# Patient Record
Sex: Female | Born: 1940 | Race: White | Hispanic: No | Smoking: Never smoker
Health system: Southern US, Community
[De-identification: ages and names within clinical notes are randomized; demographics above are authoritative.]

## PROBLEM LIST (undated history)

## (undated) DIAGNOSIS — J9601 Acute respiratory failure with hypoxia: Secondary | ICD-10-CM

## (undated) DIAGNOSIS — K219 Gastro-esophageal reflux disease without esophagitis: Secondary | ICD-10-CM

## (undated) DIAGNOSIS — J309 Allergic rhinitis, unspecified: Secondary | ICD-10-CM

## (undated) DIAGNOSIS — M25579 Pain in unspecified ankle and joints of unspecified foot: Secondary | ICD-10-CM

## (undated) DIAGNOSIS — M1990 Unspecified osteoarthritis, unspecified site: Secondary | ICD-10-CM

## (undated) DIAGNOSIS — E785 Hyperlipidemia, unspecified: Secondary | ICD-10-CM

## (undated) DIAGNOSIS — I10 Essential (primary) hypertension: Secondary | ICD-10-CM

## (undated) DIAGNOSIS — Z7901 Long term (current) use of anticoagulants: Secondary | ICD-10-CM

## (undated) HISTORY — DX: Unspecified osteoarthritis, unspecified site: M19.90

## (undated) HISTORY — PX: FRACTURE SURGERY: SHX138

## (undated) HISTORY — DX: Gastro-esophageal reflux disease without esophagitis: K21.9

## (undated) HISTORY — DX: Allergic rhinitis, unspecified: J30.9

## (undated) HISTORY — PX: COLONOSCOPY: SHX174

## (undated) HISTORY — PX: TONSILLECTOMY: SUR1361

## (undated) HISTORY — DX: Pain in unspecified ankle and joints of unspecified foot: M25.579

## (undated) HISTORY — DX: Hyperlipidemia, unspecified: E78.5

## (undated) HISTORY — DX: Essential (primary) hypertension: I10

---
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
## 2008-12-22 HISTORY — PX: OTHER SURGICAL HISTORY: SHX169

## 2008-12-22 HISTORY — PX: ROTATOR CUFF REPAIR: SHX139

## 2009-02-13 ENCOUNTER — Emergency Department (HOSPITAL_COMMUNITY): Admission: EM | Admit: 2009-02-13 | Discharge: 2009-02-14 | Payer: Self-pay | Admitting: Emergency Medicine

## 2009-02-13 ENCOUNTER — Ambulatory Visit: Payer: Self-pay | Admitting: Internal Medicine

## 2009-04-04 ENCOUNTER — Inpatient Hospital Stay (HOSPITAL_BASED_OUTPATIENT_CLINIC_OR_DEPARTMENT_OTHER): Admission: RE | Admit: 2009-04-04 | Discharge: 2009-04-04 | Payer: Self-pay | Admitting: Cardiology

## 2011-04-08 LAB — URINALYSIS, ROUTINE W REFLEX MICROSCOPIC
Bilirubin Urine: NEGATIVE
Glucose, UA: 100 mg/dL — AB
Ketones, ur: >80 mg/dL — AB
Nitrite: NEGATIVE
Specific Gravity, Urine: 1.015 (ref 1.005–1.030)
pH: 7.5 (ref 5.0–8.0)

## 2011-04-08 LAB — HEPATIC FUNCTION PANEL
AST: 35 U/L (ref 0–37)
Albumin: 4.1 g/dL (ref 3.5–5.2)
Alkaline Phosphatase: 58 U/L (ref 39–117)
Total Bilirubin: 1.1 mg/dL (ref 0.3–1.2)
Total Protein: 7.2 g/dL (ref 6.0–8.3)

## 2011-04-08 LAB — CBC
HCT: 43.7 % (ref 36.0–46.0)
MCHC: 34.7 g/dL (ref 30.0–36.0)
MCV: 91.3 fL (ref 78.0–100.0)
Platelets: 178 10*3/uL (ref 150–400)
RDW: 12.6 % (ref 11.5–15.5)

## 2011-04-08 LAB — CK TOTAL AND CKMB (NOT AT ARMC)
CK, MB: 2.3 ng/mL (ref 0.3–4.0)
CK, MB: 2.8 ng/mL (ref 0.3–4.0)
Relative Index: 2.1 (ref 0.0–2.5)
Relative Index: 2.4 (ref 0.0–2.5)

## 2011-04-08 LAB — URINE MICROSCOPIC-ADD ON

## 2011-04-08 LAB — BASIC METABOLIC PANEL
BUN: 8 mg/dL (ref 6–23)
CO2: 25 mEq/L (ref 19–32)
Chloride: 102 mEq/L (ref 96–112)
GFR calc non Af Amer: >60 mL/min (ref >60)
Glucose, Bld: 126 mg/dL — ABNORMAL HIGH (ref 70–99)
Potassium: 3.8 mEq/L (ref 3.5–5.1)

## 2011-04-08 LAB — DIFFERENTIAL
Eosinophils Absolute: 0.1 10*3/uL (ref 0.0–0.7)
Eosinophils Relative: 2 % (ref 0–5)
Lymphocytes Relative: 32 % (ref 12–46)
Lymphs Abs: 2.2 10*3/uL (ref 0.7–4.0)
Monocytes Absolute: 0.5 10*3/uL (ref 0.1–1.0)
Monocytes Relative: 8 % (ref 3–12)

## 2011-04-08 LAB — GASTRIC OCCULT BLOOD (1-CARD TO LAB): Occult Blood, Gastric: POSITIVE — AB

## 2011-05-06 NOTE — Consult Note (Signed)
NAME:  Melanie [name], CRUTCHER NO.:  [id number]   MEDICAL RECORD NO.:  [id number]          PATIENT TYPE:  EMS   LOCATION:  MAJO                         FACILITY:  [facility]   PHYSICIAN:  Renee Ramus, MD       DATE OF BIRTH:  1941-07-20   DATE OF CONSULTATION:  DATE OF DISCHARGE:                                 CONSULTATION   HISTORY OF PRESENT ILLNESS:  The patient is a  70 year old female who  presented to the emergency department with acute complaint of nausea,  vomiting, racing heart as well as some chest pain.  The patient was in  [place], while leaving [employer], she had a sudden onset of severe  nausea, vomiting, followed by racing heart.  The patient has no previous  history of these symptoms.  The patient has no previous history of  atrial fibrillation, gastroesophageal reflux disease or coronary artery  disease.  The patient denies any recent history of fever, chills, night  sweats, nausea, vomiting, chest pain, shortness of breath, PND, or  orthopnea.  When the patient was admitted to the emergency department,  she was hypertensive with blood pressure of 204/148 and a pulse rate of  137 and was fount to be in atrial fibrillation.  The patient was given  diltiazem and a GI cocktail and she is now stabilized.  She is  responding well to diltiazem drip currently on a rate of 7.  She is in  normal sinus rhythm and the GI cocktail has made her symptoms go away.  The patient has had negative troponins x2 and currently is pain-free and  symptom-free.   PAST MEDICAL HISTORY:  1. Hyperlipidemia.  2. Obesity.   SOCIAL HISTORY:  The patient denies alcohol or tobacco use.  She is  married, lives at home.   FAMILY HISTORY:  Not available.   REVIEW OF SYSTEMS:  All other comprehensive review of systems are  negative.   MEDICATIONS:  Currently fish oil.   PHYSICAL EXAMINATION:  GENERAL:  She is a well-developed, well-nourished  white female, currently in no apparent  distress.  VITAL SIGNS:  Blood pressure 133/61, heart rate 77 in sinus, respiratory  rate 20, and temperature 98.1.  HEENT:  No jugular venous distention, or lymphadenopathy.  Oropharynx is  clear.  Mucous membranes pink and moist.  TMs are clear bilaterally.  Pupils equal and reactive to light and accommodation.  Extraocular  muscles are intact.  CARDIOVASCULAR:  She has a regular rate and rhythm without evidence of  murmurs, rubs, or gallops.  PULMONARY:  Lungs are clear to auscultation bilaterally.  ABDOMEN:  Soft, obese, nontender, and nondistended without  hepatosplenomegaly.  Bowel sounds are present.  She has no rebound or  guarding.  EXTREMITIES:  She has no clubbing, cyanosis, or edema.  She has good  peripheral pulses in dorsalis pedis and radial artery.  She is able to  move all extremities.  NEUROLOGIC:  Cranial nerves II through XII are grossly intact.  She has  no focal neurological deficits.   STUDIES:  1. Abdominal ultrasound shows no evidence of acute  cholecystitis but      possibility of nonalcoholic steatohepatitis.  She does have an      enlarged liver with fatty infiltration.  2. Chest x-ray shows mild cardiomegaly with a thickened paratracheal      stripe but no evidence of acute infection or acute cardiopulmonary      disease.   LABORATORY DATA:  White count 7.1, H and H 15 and 43, MCV 91, and  platelets 178.  Sodium 140, potassium 3.8, chloride 102, bicarb 25, BUN  8, creatinine 0.7, and glucose 126.  UA shows evidence of UTI with  bacteria and leukocytes.  She has negative troponin I x2.  TSH is 2.56  and she has a CHADS score of 0.   ASSESSMENT AND PLAN:  1. Atrial fibrillation.  The patient is currently in sinus rhythm and      while she has no history of atrial fibrillation, I do not believe      that anticoagulation with Coumadin is warranted at this time.  We      are going to discharge her to home with Cardizem ER at 90 mg p.o.      b.i.d. and we  are going to have her follow up with Cardiology for      any further testing.  The patient should probably have her lipid      panel checked and revisit the idea of statin therapy and more than      likely is suffering from acute gastroesophageal reflux disease.  2. Pyelonephritis.  We will place the patient on ciprofloxacin x5      days.  3. Hyperlipidemia.  As above, would strongly consider statin therapy.  4. Obesity.  Currently, stable.  5. Question of steatohepatitis.  The patient does not have elevations      in liver enzymes and this may just represent simple fatty      infiltration of her liver.  We have counseled her against any      alcohol consumption and to change her diet.  6. Gastroesophageal reflux disease.  The patient will be placed on H2      blocker, i.e., Pepcid for 20 mg p.o. b.i.d. x6 weeks.  Will follow      up with her primary care physician as needed.  7. Disposition.  The patient will be discharged to home.   Consult note was constructed by reviewing past medical history,  conferring with emergency medical room physician, reviewing the  emergency medical record.   TIME SPENT:  1 hour.      Renee Ramus, MD  Electronically Signed     JF/MEDQ  D:  02/14/2009  T:  02/15/2009  Job:  [id number]   cc:   Nino Glow, Dr.

## 2011-05-06 NOTE — Consult Note (Signed)
NAME:  Melanie [name], DICKIE NO.:  [id number]   MEDICAL RECORD NO.:  [id number]          PATIENT TYPE:  EMS   LOCATION:  MAJO                         FACILITY:  [facility]   PHYSICIAN:  Bevelyn Buckles. Bensimhon, MDDATE OF BIRTH:  06-03-1941   DATE OF CONSULTATION:  DATE OF DISCHARGE:                                 CONSULTATION   PRIMARY CARE PHYSICIAN:  Dr. Nino Glow in [place].   REASON FOR CONSULTATION:  Chest pain in atrial fibrillation.   CONSULTING PHYSICIAN:  Dr. Marisa Severin in the ER.   HISTORY OF PRESENT ILLNESS:  Ms. Curtner is a delightful 70 year old woman  with a history of hyperlipidemia and obesity.  She denies any history of  known heart disease.  She has never had a cardiac catheterization.  She  says she was doing quite well until this evening when she went to [employer].  While she was eating, she had the sudden onset of chest and  epigastric pain associated with nausea and vomiting.  On arrival to the  emergency room, she was in severe pain.  She was treated with morphine  and nitroglycerin.  EKG showed atrial fibrillation with a rate of 160.  She was given several boluses of diltiazem, but her heart rate initially  came down then climbed back up.  She was then started on the diltiazem  drip and her heart rate is now well controlled.  Cardiac markers are  normal.   REVIEW OF SYSTEMS:  She denies any exertional chest pain, shortness of  breath or palpitations.  She has not had any congestive heart failure  symptoms.  She denies any previous history of gallstone disease or  pancreatitis.  She has not had any recent diarrhea, fevers or chills.   Otherwise, all systems negative.   PROBLEM LIST:  1. Obesity.  2. Hyperlipidemia.   CURRENT MEDICATIONS:  Fish oil.   ALLERGIES/INTOLERANCES:  SHE IS INTOLERANT TO NIASPAN DUE TO FLUSHING.   SOCIAL HISTORY:  She is married.  She lives in [place].  She is  retired.  Denies any tobacco or alcohol use.   FAMILY HISTORY:  Father died at age 17 due to blood poisoning from strep  throat.  Mother died in her 50s.  She thinks she had some heart disease.  There is no family history of premature coronary disease.   PHYSICAL EXAMINATION:  GENERAL:  She is mildly uncomfortable.  VITAL SIGNS:  Blood pressure is 101/55.  Her heart rate 85.  She is  satting 93% on room air.  She is afebrile.  HEENT:  Normal.  NECK:  Supple.  No JVD.  Carotids are 2+ bilaterally without bruits.  There is no lymphadenopathy or thyromegaly.  CARDIAC:  PMI is nondisplaced.  Regular rate and rhythm.  No murmurs,  rubs or gallops.  LUNGS:  Clear.  ABDOMEN:  Obese.  She is markedly tender in the epigastrium.  There is  no rebound.  There hypoactive bowel sounds.  No hepatosplenomegaly.  EXTREMITIES:  Warm with no cyanosis, clubbing or edema.  There is good  distal pulses.  No rash.  NEUROLOGICAL:  Alert and oriented x3.  Cranial nerves II-XII are intact.  Moves all four extremities without difficulty.   LABORATORY DATA:  White count is 7.1, hemoglobin 15.1, platelets are  178.  Sodium 140, potassium 3.8, BUN 8, creatinine 0.76, troponin is  less than 0.01, CK is 158, MB is 1.9, AST is 35, ALT is 26, alk phos is  58, bilirubin is 1.1.  Amylase is 131, lipase is 34.  EKG shows atrial  fibrillation at a ventricular rate of 160.  There is minimal nonspecific  ST depression.   ASSESSMENT/PLAN:  1. Chest epigastric pain after eating.  2. Probable new onset atrial fibrillation with rapid ventricular      response.  3. Obesity.  4. Hyperlipidemia.   PLAN/DISCUSSION:  Despite her normal LFTs and amylase and lipase, her  exam and history is very concerning for gallstone pancreatitis.  I  discussed this with Dr. Norlene Campbell.  We will go ahead and order an abdominal  ultrasound to further evaluate.  Cardiology will follow for management  of her atrial fibrillation.  We will continue on her Cardizem drip for  now given suspicion of  pancreatitis.  I would hold anticoagulation given  the risk of hemorrhagic transformation.  We will cycle cardiac markers  and check an echocardiogram and thyroid panel.  She will likely be  admitted to the internal medicine service.      Bevelyn Buckles. Bensimhon, MD  Electronically Signed     DRB/MEDQ  D:  02/13/2009  T:  02/14/2009  Job:  [id number]

## 2011-05-06 NOTE — Cardiovascular Report (Signed)
NAME:  PURVI, RUEHL NO.:  [id number]   MEDICAL RECORD NO.:  [id number]          PATIENT TYPE:  OIB   LOCATION:  1961                         FACILITY:  [facility]   PHYSICIAN:  Armanda Magic, M.D.     DATE OF BIRTH:  01/12/1941   DATE OF PROCEDURE:  04/04/2009  DATE OF DISCHARGE:  04/04/2009                            CARDIAC CATHETERIZATION   REFERRING PHYSICIAN:  The patient does not have a referring physician.   PROCEDURES:  Left heart catheterization, coronary angiography, left  ventriculography.   OPERATOR:  Armanda Magic, MD   INDICATIONS:  Shortness of breath and abnormal Cardiolite.   COMPLICATIONS:  None.   IV MEDICATIONS:  1. Versed 1 mg.  2. Fentanyl 25 mcg IV.   COMPLICATIONS:  None.   This is a 70 year old female who presents with shortness of breath and  was found to have an abnormal Cardiolite, now presents for cardiac  catheterization.   The patient was brought to the Cardiac Catheterization Laboratory in a  fasting nonsedated state.  Informed consent was obtained.  The patient  was connected to continuous heart rate and pulse oximetry monitoring and  intermittent blood pressure.  The right groin was prepped and draped in  a sterile fashion.  Xylocaine 1% was used for local anesthesia.  Using  modified Seldinger technique, a 4-French sheath was placed in the right  femoral artery.  Under fluoroscopic guidance, a 4-French JL-4 catheter  was placed in the left coronary artery.  Multiple cine films were taken  at 30-degrees RAO and 40-degree LAO views.  This catheter was exchanged  out over a guidewire for a 4-French 3-D RCA catheter and multiple cine  films were taken at 30-degree RAO and 40-degree LAO views.  This  catheter was then exchanged out over a guidewire for a 4-French angled  pigtail catheter which was placed under fluoroscopic guidance in the  left ventricular cavity.  Left ventriculography was performed in the 30-  degree RAO  view using a total of 30 mL of contrast at 15 mL per second.  Catheter was then pulled back across the aortic valve with no  significant gradient noted.  At the end of the procedure all catheters  and sheaths were removed.  Manual compression was performed until  adequate hemostasis was obtained.  The patient was transferred back to  room in stable condition.   RESULTS:  Left main coronary artery is widely patent and bifurcates to  left anterior descending artery and left circumflex artery.   Left anterior descending artery is widely patent throughout its course  of the apex giving rise to a first diagonal branch which is widely  patent.  The diagonal bifurcates into 2 daughter breath vessels both of  which are widely patent.   The left circumflex is widely patent throughout its course in the AV  groove.  It gives rise to a moderate-sized obtuse marginal 1 and obtuse  marginal 2 branches both of which are widely patent.   The right coronary artery is widely patent and bifurcates into posterior  descending artery and posterolateral artery both of which  are widely  patent and traversed the inferior wall.   Left ventriculography shows normal LV function, EF of 60%.   ASSESSMENT:  1. Normal coronary arteries.  2. Normal left ventricular function.  3. Shortness of breath, most likely secondary to underlying obesity.   PLAN:  1. We will discharge to home.  2. Outpatient telemetry monitor to evaluate her one episode of      paroxysmal atrial fibrillation to make sure she is not having a      silent AFib.  She will follow up with me in 2 weeks for groin      check.      Armanda Magic, M.D.  Electronically Signed     TT/MEDQ  D:  04/06/2009  T:  04/07/2009  Job:  [id number]

## 2011-11-06 DIAGNOSIS — Z86010 Personal history of colonic polyps: Secondary | ICD-10-CM | POA: Insufficient documentation

## 2012-01-14 ENCOUNTER — Ambulatory Visit (INDEPENDENT_AMBULATORY_CARE_PROVIDER_SITE_OTHER): Payer: Medicare Other | Admitting: Family Medicine

## 2012-01-14 ENCOUNTER — Encounter: Payer: Self-pay | Admitting: Family Medicine

## 2012-01-14 DIAGNOSIS — E1149 Type 2 diabetes mellitus with other diabetic neurological complication: Secondary | ICD-10-CM

## 2012-01-14 DIAGNOSIS — E1142 Type 2 diabetes mellitus with diabetic polyneuropathy: Secondary | ICD-10-CM

## 2012-01-14 DIAGNOSIS — G5790 Unspecified mononeuropathy of unspecified lower limb: Secondary | ICD-10-CM

## 2012-01-14 DIAGNOSIS — I10 Essential (primary) hypertension: Secondary | ICD-10-CM

## 2012-01-14 DIAGNOSIS — M542 Cervicalgia: Secondary | ICD-10-CM

## 2012-01-14 DIAGNOSIS — E785 Hyperlipidemia, unspecified: Secondary | ICD-10-CM

## 2012-01-14 LAB — CBC WITH DIFFERENTIAL/PLATELET
Basophils Absolute: 0 10*3/uL (ref 0.0–0.1)
Eosinophils Absolute: 0.1 10*3/uL (ref 0.0–0.7)
Hemoglobin: 13.6 g/dL (ref 12.0–15.0)
Lymphocytes Relative: 28 % (ref 12.0–46.0)
MCHC: 33.8 g/dL (ref 30.0–36.0)
Monocytes Relative: 8 % (ref 3.0–12.0)
Neutrophils Relative %: 60.6 % (ref 43.0–77.0)
RDW: 12.7 % (ref 11.5–14.6)

## 2012-01-14 LAB — BASIC METABOLIC PANEL
CO2: 30 mEq/L (ref 19–32)
Chloride: 103 mEq/L (ref 96–112)
Creatinine, Ser: 0.8 mg/dL (ref 0.4–1.2)
Potassium: 4.2 mEq/L (ref 3.5–5.1)
Sodium: 141 mEq/L (ref 135–145)

## 2012-01-14 LAB — HEPATIC FUNCTION PANEL
ALT: 20 U/L (ref 0–35)
AST: 19 U/L (ref 0–37)
Alkaline Phosphatase: 46 U/L (ref 39–117)
Bilirubin, Direct: 0.1 mg/dL (ref 0.0–0.3)
Total Bilirubin: 0.9 mg/dL (ref 0.3–1.2)
Total Protein: 6.9 g/dL (ref 6.0–8.3)

## 2012-01-14 LAB — LIPID PANEL
Cholesterol: 152 mg/dL (ref 0–200)
LDL Cholesterol: 82 mg/dL (ref 0–99)
Total CHOL/HDL Ratio: 4
VLDL: 32.8 mg/dL (ref 0.0–40.0)

## 2012-01-14 LAB — HEMOGLOBIN A1C: Hgb A1c MFr Bld: 7.1 % — ABNORMAL HIGH (ref 4.6–6.5)

## 2012-01-14 MED ORDER — MELOXICAM 15 MG PO TABS: 15.0000 mg | ORAL_TABLET | Freq: Every day | ORAL | Status: AC

## 2012-01-14 MED ORDER — MELOXICAM 15 MG PO TABS: 15.0000 mg | ORAL_TABLET | Freq: Every day | ORAL | Status: DC

## 2012-01-14 MED ORDER — CYCLOBENZAPRINE HCL 5 MG PO TABS: 5.0000 mg | ORAL_TABLET | Freq: Three times a day (TID) | ORAL | Status: DC | PRN

## 2012-01-14 MED ORDER — CYCLOBENZAPRINE HCL 5 MG PO TABS: 5.0000 mg | ORAL_TABLET | Freq: Three times a day (TID) | ORAL | Status: AC | PRN

## 2012-01-14 NOTE — Assessment & Plan Note (Signed)
Chronic problem.  On statin w/out difficulty.  Overdue for labs.  Check labs.  Adjust meds prn

## 2012-01-14 NOTE — Assessment & Plan Note (Signed)
Chronic problem.  Pt reports this pre-dates DM.  Discussed neurontin, pt not interested in starting meds at this time.  Will continue to follow.

## 2012-01-14 NOTE — Assessment & Plan Note (Signed)
Pt has bilateral trap spasm.  Start scheduled NSAID and flexeril nightly prn.  Reviewed supportive care and red flags that should prompt return.  Pt expressed understanding and is in agreement w/ plan.

## 2012-01-14 NOTE — Progress Notes (Signed)
  Subjective:    Patient ID: Melanie [name], female    DOB: 01-30-1941, 71 y.o.   MRN: [id number]  HPI New to establish.  Previous MD- Darleen Crocker, NP in [place] [phone].  Recently moved to [place].  Health maintenance- Last CPE was April 2012.  UTD on pap.  Overdue on mammo b/c of husband's death.  Last colonoscopy 2 yrs ago- had polyps, due for repeat in 3 yrs.  HTN- chronic problem, typically well controlled on Dilt and Lisinopril.  No CP, SOB, HAs, visual changes, edema.  Hyperlipidemia- chronic problem, currently on Pravastatin.  No abd pain, N/V, myalgias.  DM- dx'd 1 year ago.  On Metformin.  Not checking sugars bc 'it scares me when it's high'.  Has not had A1C checked since April.  Neuropathy- dx'd 12 yrs ago, bilateral feet.  Will have burning and tingling.  Reports this usually doesn't bother her during the day but does at night.  Not interested in starting meds at this time.  Neck pain- pt w/ rotation, sxs started 'years ago' but has worsened in the last few months.  Painful w/ certain sleeping positions.  No radiation of pain.  Pain will minimally improve w/ Advil.  Review of Systems     Objective:   Physical Exam  Vitals reviewed. Constitutional: She is oriented to person, place, and time. She appears well-developed and well-nourished. No distress.  HENT:  Head: Normocephalic and atraumatic.  Eyes: Conjunctivae and EOM are normal. Pupils are equal, round, and reactive to light.  Neck: Normal range of motion. No thyromegaly present.       Bilateral trap spasm  Cardiovascular: Normal rate, regular rhythm, normal heart sounds and intact distal pulses.   No murmur heard. Pulmonary/Chest: Effort normal and breath sounds normal. No respiratory distress.  Abdominal: Soft. She exhibits no distension. There is no tenderness.  Musculoskeletal: She exhibits no edema.  Lymphadenopathy:    She has no cervical adenopathy.  Neurological: She is alert and oriented to person,  place, and time. Coordination normal.  Skin: Skin is warm and dry.  Psychiatric: She has a normal mood and affect. Her behavior is normal.          Assessment & Plan:

## 2012-01-14 NOTE — Assessment & Plan Note (Signed)
Chronic problem.  Not checking sugars.  Overdue for A1C.  Tolerating metformin w/out difficulty.  Will check labs and adjust meds prn.  Pt expressed understanding and is in agreement w/ plan.

## 2012-01-14 NOTE — Assessment & Plan Note (Signed)
Chronic problem.  Asymptomatic.  Tolerating meds w/out difficulty.  No changes today.

## 2012-01-14 NOTE — Patient Instructions (Signed)
Schedule your complete physical for April We'll notify you of your lab results and let you know if we need to make any med changes Start the Mobic daily for the neck pain Use the Flexeril at night for muscle spasm Heating pad as needed for pain Call with any questions or concerns Welcome!  We're glad to have you!

## 2012-01-15 MED ORDER — METFORMIN HCL 500 MG PO TABS: 500.0000 mg | ORAL_TABLET | Freq: Two times a day (BID) | ORAL | Status: DC

## 2012-01-15 MED ORDER — PRAVASTATIN SODIUM 20 MG PO TABS: 20.0000 mg | ORAL_TABLET | Freq: Every day | ORAL | Status: DC

## 2012-01-15 MED ORDER — DILTIAZEM HCL 90 MG PO TABS: 90.0000 mg | ORAL_TABLET | Freq: Two times a day (BID) | ORAL | Status: DC

## 2012-01-15 MED ORDER — OMEPRAZOLE 20 MG PO CPDR: 20.0000 mg | DELAYED_RELEASE_CAPSULE | Freq: Every day | ORAL | Status: DC

## 2012-01-15 MED ORDER — LISINOPRIL-HYDROCHLOROTHIAZIDE 10-12.5 MG PO TABS: 1.0000 | ORAL_TABLET | Freq: Every day | ORAL | Status: DC

## 2012-01-15 NOTE — Progress Notes (Signed)
Addended by: Derry Lory A on: 01/15/2012 08:21 AM   Modules accepted: Orders

## 2012-04-06 ENCOUNTER — Encounter: Payer: Self-pay | Admitting: Internal Medicine

## 2012-04-06 ENCOUNTER — Ambulatory Visit (INDEPENDENT_AMBULATORY_CARE_PROVIDER_SITE_OTHER): Payer: Medicare Other | Admitting: Internal Medicine

## 2012-04-06 VITALS — BP 138/80 | HR 69 | Temp 97.5°F | Wt 189.0 lb

## 2012-04-06 DIAGNOSIS — J329 Chronic sinusitis, unspecified: Secondary | ICD-10-CM

## 2012-04-06 MED ORDER — AZELASTINE HCL 0.1 % NA SOLN: 2.0000 | Freq: Two times a day (BID) | NASAL | Status: DC

## 2012-04-06 MED ORDER — AMOXICILLIN 500 MG PO CAPS: 1000.0000 mg | ORAL_CAPSULE | Freq: Two times a day (BID) | ORAL | Status: AC

## 2012-04-06 NOTE — Progress Notes (Signed)
  Subjective:    Patient ID: Melanie [name], female    DOB: [birth year], 71 y.o.   MRN: [id number]  HPI Acute visit Symptoms started 3 weeks ago with a "head cold", symptoms were on and off and got better with over-the-counter medicines however at this time symptoms are more persistent and she also has developed a cough.  Past medical history Diabetes, hypertension, hyperlipidemia.  Social history  she is not a smoker    Review of Systems Mild subjective fever, no chills. Occasional sputum, yellow in color. Denies any chest pain or difficulty breathing. No wheezing.     Objective:   Physical Exam  General -- alert, well-developed, and well-nourished. NAD  Neck --no LADs HEENT -- TMs normal, throat w/o redness, face symmetric and  tender to palpation at both maxillary areas, EOMI, PERRLA.  Nose congested Lungs -- normal respiratory effort, no intercostal retractions, no accessory muscle use; she does have rhonchi with cough, no wheezing or increased work of breathing Heart-- normal rate, regular rhythm, no murmur, and no gallop.   Extremities-- no pretibial edema bilaterally      Assessment & Plan:

## 2012-04-06 NOTE — Patient Instructions (Signed)
Rest, fluids , tylenol For cough, take Mucinex DM twice a day as needed for cough and congestion For congestion use astelin nasal spray twice a day until you feel better Take the antibiotic as prescribed  (Amoxicillin) Call if no better in few days Call anytime if the symptoms are severe

## 2012-04-06 NOTE — Assessment & Plan Note (Signed)
71 year old lady with diabetes presents with sinusitis and bronchitis. She is in no distress. Given comorbidities ,i  Liked to prescribe Avelox but she states she won't be able to afford. We'll prescribe amoxicillin, see instructions, she is recommended to call promptly if she's not improving on amoxicillin.

## 2012-05-11 ENCOUNTER — Ambulatory Visit (INDEPENDENT_AMBULATORY_CARE_PROVIDER_SITE_OTHER): Payer: Medicare Other | Admitting: Family Medicine

## 2012-05-11 ENCOUNTER — Encounter: Payer: Self-pay | Admitting: Family Medicine

## 2012-05-11 VITALS — BP 134/80 | HR 76 | Temp 98.5°F | Ht 64.0 in | Wt 189.2 lb

## 2012-05-11 DIAGNOSIS — E1149 Type 2 diabetes mellitus with other diabetic neurological complication: Secondary | ICD-10-CM

## 2012-05-11 DIAGNOSIS — E1142 Type 2 diabetes mellitus with diabetic polyneuropathy: Secondary | ICD-10-CM

## 2012-05-11 DIAGNOSIS — E785 Hyperlipidemia, unspecified: Secondary | ICD-10-CM

## 2012-05-11 DIAGNOSIS — M542 Cervicalgia: Secondary | ICD-10-CM

## 2012-05-11 DIAGNOSIS — IMO0001 Reserved for inherently not codable concepts without codable children: Secondary | ICD-10-CM

## 2012-05-11 LAB — BASIC METABOLIC PANEL
BUN: 19 mg/dL (ref 6–23)
CO2: 28 mEq/L (ref 19–32)
GFR: 66.49 mL/min (ref >60.00)
Glucose, Bld: 146 mg/dL — ABNORMAL HIGH (ref 70–99)
Potassium: 3.9 mEq/L (ref 3.5–5.1)

## 2012-05-11 LAB — LIPID PANEL: Cholesterol: 192 mg/dL (ref 0–200)

## 2012-05-11 LAB — HEPATIC FUNCTION PANEL
ALT: 19 U/L (ref 0–35)
Albumin: 4 g/dL (ref 3.5–5.2)
Alkaline Phosphatase: 51 U/L (ref 39–117)
Total Protein: 6.8 g/dL (ref 6.0–8.3)

## 2012-05-11 MED ORDER — HYDROCODONE-ACETAMINOPHEN 5-500 MG PO TABS: 1.0000 | ORAL_TABLET | Freq: Three times a day (TID) | ORAL | Status: AC | PRN

## 2012-05-11 NOTE — Patient Instructions (Signed)
We'll call you with your ortho appt We'll notify you of your lab results and make any med changes if needed Use the Vicodin as needed for severe pain Call with any questions or concerns Hang in there!!!

## 2012-05-11 NOTE — Progress Notes (Signed)
  Subjective:    Patient ID: Melanie [name], female    DOB: May 13, 1941, 71 y.o.   MRN: [id number]  HPI Neck pain- chronic problem, had similar sxs in Jan and was started on Mobic and Flexeril w/ temporary relief.  Now pain is again severe.  Limited rotation.  No radiation of pain into shoulders or arms.  Will radiate up into skull and cause HAs.  Has never seen ortho or neurosurg for neck pain.  Has not had xrays or MRI that she can remember.  DM- chronic problem, on Metformin.  Not checking CBGs.  UTD on eye exam.  Denies symptomatic lows.  No CP, SOB, visual changes, edema.  Hyperlipidemia- chronic problem, on pravastatin.  No abd pain, N/V, myalgias.   Review of Systems For ROS see HPI     Objective:   Physical Exam  Vitals reviewed. Constitutional: She is oriented to person, place, and time. She appears well-developed and well-nourished. No distress.  HENT:  Head: Normocephalic and atraumatic.  Eyes: Conjunctivae and EOM are normal. Pupils are equal, round, and reactive to light.  Neck: Neck supple. No thyromegaly present.       + trap spasm Decreased rotational movement w/ preserved flexion/extension  Cardiovascular: Normal rate, regular rhythm, normal heart sounds and intact distal pulses.   No murmur heard. Pulmonary/Chest: Effort normal and breath sounds normal. No respiratory distress.  Abdominal: Soft. She exhibits no distension. There is no tenderness.  Musculoskeletal: She exhibits no edema.  Lymphadenopathy:    She has no cervical adenopathy.  Neurological: She is alert and oriented to person, place, and time.  Skin: Skin is warm and dry.  Psychiatric: She has a normal mood and affect. Her behavior is normal.          Assessment & Plan:

## 2012-05-18 NOTE — Assessment & Plan Note (Signed)
Chronic problem.  Tolerating meds w/out difficulty.  Check labs.  Adjust meds prn  

## 2012-05-18 NOTE — Assessment & Plan Note (Signed)
Chronic problem.  Due for labs.  Currently asymptomatic.  Adjust meds prn.

## 2012-05-18 NOTE — Assessment & Plan Note (Signed)
Deteriorated.  sxs temporarily improved w/ NSAIDs and muscle relaxers.  Has never seen ortho or neurosurg and doesn't recall having imaging done.  Refer to ortho for complete eval and tx.  Reviewed supportive care and red flags that should prompt return.  Pt expressed understanding and is in agreement w/ plan.

## 2012-05-21 ENCOUNTER — Encounter: Payer: Medicare Other | Admitting: Family Medicine

## 2012-06-15 ENCOUNTER — Encounter: Payer: Self-pay | Admitting: *Deleted

## 2012-07-06 ENCOUNTER — Ambulatory Visit (INDEPENDENT_AMBULATORY_CARE_PROVIDER_SITE_OTHER): Payer: Medicare Other | Admitting: Family Medicine

## 2012-07-06 ENCOUNTER — Encounter: Payer: Self-pay | Admitting: Gastroenterology

## 2012-07-06 ENCOUNTER — Other Ambulatory Visit (HOSPITAL_COMMUNITY)
Admission: RE | Admit: 2012-07-06 | Discharge: 2012-07-06 | Disposition: A | Discharge status: Home / Self Care | Payer: Medicare Other | Source: Ambulatory Visit | Attending: Family Medicine | Admitting: Family Medicine

## 2012-07-06 ENCOUNTER — Encounter: Payer: Self-pay | Admitting: Family Medicine

## 2012-07-06 VITALS — BP 135/78 | HR 89 | Temp 98.1°F | Ht 64.25 in | Wt 184.2 lb

## 2012-07-06 DIAGNOSIS — Z0000 Encounter for general adult medical examination without abnormal findings: Secondary | ICD-10-CM

## 2012-07-06 DIAGNOSIS — Z124 Encounter for screening for malignant neoplasm of cervix: Secondary | ICD-10-CM | POA: Insufficient documentation

## 2012-07-06 DIAGNOSIS — Z780 Asymptomatic menopausal state: Secondary | ICD-10-CM

## 2012-07-06 DIAGNOSIS — E1149 Type 2 diabetes mellitus with other diabetic neurological complication: Secondary | ICD-10-CM

## 2012-07-06 DIAGNOSIS — Z1231 Encounter for screening mammogram for malignant neoplasm of breast: Secondary | ICD-10-CM

## 2012-07-06 DIAGNOSIS — E1142 Type 2 diabetes mellitus with diabetic polyneuropathy: Secondary | ICD-10-CM

## 2012-07-06 DIAGNOSIS — I10 Essential (primary) hypertension: Secondary | ICD-10-CM

## 2012-07-06 DIAGNOSIS — K635 Polyp of colon: Secondary | ICD-10-CM

## 2012-07-06 DIAGNOSIS — D126 Benign neoplasm of colon, unspecified: Secondary | ICD-10-CM

## 2012-07-06 DIAGNOSIS — E785 Hyperlipidemia, unspecified: Secondary | ICD-10-CM

## 2012-07-06 NOTE — Progress Notes (Signed)
  Subjective:    Patient ID: Melanie [name], female    DOB: [birth year], 71 y.o.   MRN: [id number]  HPI Here today for CPE.  Risk Factors: HTN- chronic problem.  Adequate control on Dilt, Lisinopril HCT.  No CP, SOB, HAs, visual changes, edema. Hyperlipidemia- chronic problem.  Labs on 5/21 showed LDL not at goal (117) and pt was advised to double Pravastatin.  Pt opted to keep dose stable, 'i don't want to do that'.  Denies abd pain, N/V, myalgias. DM- chronic problem, labs on 5/21 showed A1C of 7.2.  On Metformin 500mg  BID.  UTD on eye exam.  Denies symptomatic lows. Physical Activity:  Walking dogs daily and doing water aerobics twice weekly Fall Risk: very steady on feet Depression: no current sxs Hearing: normal to conversational tones, mildly decreased to whispered voice ADL's: independent Cognitive: normal linear thought process, memory and attention intact Home Safety: feels safe at home, living w/ son. Height, Weight, BMI, Visual Acuity: see vitals, vision corrected to 20/20 w/ glasses Counseling: due for mammo, DEXA, colonoscopy due to polyps Labs Ordered: See A&P Care Plan: See A&P    Review of Systems Patient reports no vision/ hearing changes, adenopathy,fever, weight change,  persistant/recurrent hoarseness , swallowing issues, chest pain, palpitations, edema, persistant/recurrent cough, hemoptysis, dyspnea (rest/exertional/paroxysmal nocturnal), gastrointestinal bleeding (melena, rectal bleeding), abdominal pain, significant heartburn, bowel changes, GU symptoms (dysuria, hematuria, incontinence), Gyn symptoms (abnormal  bleeding, pain),  syncope, focal weakness, memory loss, numbness & tingling, skin/hair/nail changes, abnormal bruising or bleeding, anxiety, or depression.     Objective:   Physical Exam  General Appearance:    Alert, cooperative, no distress, appears stated age  Head:    Normocephalic, without obvious abnormality, atraumatic  Eyes:    PERRL,  conjunctiva/corneas clear, EOM's intact, fundi    benign, both eyes  Ears:    Normal TM's and external ear canals, both ears  Nose:   Nares normal, septum midline, mucosa normal, no drainage    or sinus tenderness  Throat:   Lips, mucosa, and tongue normal; teeth and gums normal  Neck:   Supple, symmetrical, trachea midline, no adenopathy;    Thyroid: no enlargement/tenderness/nodules  Back:     Symmetric, no curvature, ROM normal, no CVA tenderness  Lungs:     Clear to auscultation bilaterally, respirations unlabored  Chest Wall:    No tenderness or deformity   Heart:    Regular rate and rhythm, S1 and S2 normal, II/VI SEM, no rub   or gallop  Breast Exam:    No tenderness, masses, or nipple abnormality  Abdomen:     Soft, non-tender, bowel sounds active all four quadrants,    no masses, no organomegaly  Genitalia:    External genitalia normal, cervix normal in appearance, no CMT, uterus in normal size and position, adnexa w/out mass or tenderness, mucosa pink and moist, no lesions or discharge present  Rectal:    Normal external appearance  Extremities:   Extremities normal, atraumatic, no cyanosis or edema  Pulses:   2+ and symmetric all extremities  Skin:   Skin color, texture, turgor normal, no rashes or lesions  Lymph nodes:   Cervical, supraclavicular, and axillary nodes normal  Neurologic:   CNII-XII intact, normal strength, sensation and reflexes    throughout          Assessment & Plan:

## 2012-07-06 NOTE — Assessment & Plan Note (Signed)
Chronic problem.  Pt did not double pravastatin as recommended.  Not willing to do this.  Will continue to follow.

## 2012-07-06 NOTE — Assessment & Plan Note (Signed)
Pt's PE WNL.  Pap collected.  Referral made for mammo and DEXA.  Will refer to local GI for colonoscopy.  Reviewed labs from last visit.  EKG done- see document for interpretation.  Anticipatory guidance provided.

## 2012-07-06 NOTE — Assessment & Plan Note (Signed)
Chronic problem.  Adequate control.  Asymptomatic.  No changes. 

## 2012-07-06 NOTE — Patient Instructions (Addendum)
Follow up in 3 months to recheck diabetes We'll let you know about your pap and call with your bone density and mammo Someone will call you with your GI appt Call with any questions or concerns Happy Early Birthday!

## 2012-07-06 NOTE — Assessment & Plan Note (Signed)
Referral made to local GI as pt reports she got call back letter from previous MD

## 2012-07-06 NOTE — Assessment & Plan Note (Signed)
Chronic problem.  Adequate control.  UTD on eye exam.  Foot exam done today.  Will continue to follow closely.

## 2012-07-09 ENCOUNTER — Encounter: Payer: Self-pay | Admitting: *Deleted

## 2012-07-29 ENCOUNTER — Ambulatory Visit
Admission: RE | Admit: 2012-07-29 | Discharge: 2012-07-29 | Disposition: A | Discharge status: Home / Self Care | Payer: Medicare Other | Source: Ambulatory Visit | Attending: Family Medicine | Admitting: Family Medicine

## 2012-07-29 DIAGNOSIS — Z780 Asymptomatic menopausal state: Secondary | ICD-10-CM

## 2012-07-29 DIAGNOSIS — Z1231 Encounter for screening mammogram for malignant neoplasm of breast: Secondary | ICD-10-CM

## 2012-07-30 ENCOUNTER — Encounter: Payer: Self-pay | Admitting: *Deleted

## 2012-08-04 ENCOUNTER — Ambulatory Visit (AMBULATORY_SURGERY_CENTER): Payer: Medicare Other | Admitting: *Deleted

## 2012-08-04 VITALS — Ht 64.0 in | Wt 184.5 lb

## 2012-08-04 DIAGNOSIS — Z1211 Encounter for screening for malignant neoplasm of colon: Secondary | ICD-10-CM

## 2012-08-04 MED ORDER — MOVIPREP 100 G PO SOLR: ORAL | Status: DC

## 2012-08-05 ENCOUNTER — Encounter: Payer: Self-pay | Admitting: Gastroenterology

## 2012-08-05 ENCOUNTER — Telehealth: Payer: Self-pay | Admitting: *Deleted

## 2012-08-05 NOTE — Telephone Encounter (Signed)
Noted  

## 2012-08-05 NOTE — Telephone Encounter (Signed)
Pt scheduled for colonoscopy with Dr. Jarold Motto 08/18/2012.  Last colonoscopy 3 years ago in [place], [place] with Dr. Crista Luria.  Release of information form signed and given to Ronny Bacon, CMA.

## 2012-08-05 NOTE — Progress Notes (Signed)
Pt scheduled for colonoscopy with Dr. Patterson 08/18/2012.  Last colonoscopy 3 years ago in [place], [place] with Dr. William Balckard.  Release of information form signed and given to Dottie Smith, CMA. 

## 2012-08-10 ENCOUNTER — Telehealth: Payer: Self-pay | Admitting: Gastroenterology

## 2012-08-10 ENCOUNTER — Telehealth: Payer: Self-pay | Admitting: *Deleted

## 2012-08-10 NOTE — Telephone Encounter (Signed)
Mailed copy of results from recent bone density scan noted as Normal by MD Beverely Low

## 2012-08-10 NOTE — Telephone Encounter (Signed)
Forward 9 pages from [facility] for [facility] to Dr. Sheryn Bison for review on 08-10-12 ym

## 2012-08-16 ENCOUNTER — Encounter: Payer: Self-pay | Admitting: Family Medicine

## 2012-08-18 ENCOUNTER — Encounter: Payer: Self-pay | Admitting: Gastroenterology

## 2012-08-18 ENCOUNTER — Encounter: Payer: Medicare Other | Admitting: Gastroenterology

## 2012-08-18 ENCOUNTER — Ambulatory Visit (AMBULATORY_SURGERY_CENTER): Payer: Medicare Other | Admitting: Gastroenterology

## 2012-08-18 VITALS — BP 119/60 | HR 63 | Temp 98.7°F | Resp 20 | Ht 64.0 in | Wt 184.0 lb

## 2012-08-18 DIAGNOSIS — Z86010 Personal history of colonic polyps: Secondary | ICD-10-CM

## 2012-08-18 DIAGNOSIS — D126 Benign neoplasm of colon, unspecified: Secondary | ICD-10-CM

## 2012-08-18 DIAGNOSIS — K635 Polyp of colon: Secondary | ICD-10-CM

## 2012-08-18 DIAGNOSIS — Z1211 Encounter for screening for malignant neoplasm of colon: Secondary | ICD-10-CM

## 2012-08-18 DIAGNOSIS — K5730 Diverticulosis of large intestine without perforation or abscess without bleeding: Secondary | ICD-10-CM

## 2012-08-18 MED ORDER — SODIUM CHLORIDE 0.9 % IV SOLN: 500.0000 mL | INTRAVENOUS | Status: DC

## 2012-08-18 NOTE — Op Note (Signed)
 [facility] [address] [place], [address]   COLONOSCOPY PROCEDURE REPORT  PATIENT: Melanie, [name]  MR#: [id number] BIRTHDATE: 16-Feb-1941 , 71  yrs. old GENDER: Female ENDOSCOPIST: Mardella Layman, MD, Clementeen Graham REFERRED BY:  Neena Rhymes, M.D. PROCEDURE DATE:  08/18/2012 PROCEDURE:   Colonoscopy with biopsy ASA CLASS:   Class III INDICATIONS:follow up of adenomatous colonic polyp(s). MEDICATIONS: propofol (Diprivan) 300mg  IV  DESCRIPTION OF PROCEDURE:   After the risks and benefits and of the procedure were explained, informed consent was obtained.  A digital rectal exam revealed no abnormalities of the rectum.    The LB [id number]  endoscope was introduced through the anus and advanced to the cecum, which was identified by both the appendix and ileocecal valve .  The quality of the prep was adequate, using MoviPrep .  The instrument was then slowly withdrawn as the colon was fully examined.     COLON FINDINGS: The colon was redundant.   Moderate diverticulosis was noted in the descending colon and sigmoid colon.   Multiple small smooth flat polyps were found in the rectum.  Multiple biopsies of the area were performed using a cold snare. Retroflexed views revealed no abnormalities.     The scope was then withdrawn from the patient and the procedure completed.  COMPLICATIONS: There were no complications. ENDOSCOPIC IMPRESSION: 1.   The colon was redundant 2.   Moderate diverticulosis was noted in the descending colon and sigmoid colon 3.   Multiple small flat polyps were found in the rectum; multiple biopsies of the area were performed using a cold snare  RECOMMENDATIONS: 1.  await pathology results 2.  Repeat colonoscopy in 5 years if polyp adenomatous; otherwise 10 years 3.  High fiber diet   REPEAT EXAM:  cc:  _______________________________ eSignedMardella Layman, MD, [facility] 08/18/2012 3:17 PM     PATIENT NAME:   Melanie, [name] MR#: [id number]

## 2012-08-18 NOTE — Progress Notes (Signed)
Patient did not experience any of the following events: a burn prior to discharge; a fall within the facility; wrong site/side/patient/procedure/implant event; or a hospital transfer or hospital admission upon discharge from the facility. (G8907) Patient did not have preoperative order for IV antibiotic SSI prophylaxis. (G8918)  

## 2012-08-18 NOTE — Patient Instructions (Addendum)
YOU HAD AN ENDOSCOPIC PROCEDURE TODAY AT THE [facility]: Refer to the procedure report that was given to you for any specific questions about what was found during the examination.  If the procedure report does not answer your questions, please call your gastroenterologist to clarify.  If you requested that your care partner not be given the details of your procedure findings, then the procedure report has been included in a sealed envelope for you to review at your convenience later.  YOU SHOULD EXPECT: Some feelings of bloating in the abdomen. Passage of more gas than usual.  Walking can help get rid of the air that was put into your GI tract during the procedure and reduce the bloating. If you had a lower endoscopy (such as a colonoscopy or flexible sigmoidoscopy) you may notice spotting of blood in your stool or on the toilet paper. If you underwent a bowel prep for your procedure, then you may not have a normal bowel movement for a few days.  DIET: Your first meal following the procedure should be a light meal and then it is ok to progress to your normal diet.  A half-sandwich or bowl of soup is an example of a good first meal.  Heavy or fried foods are harder to digest and may make you feel nauseous or bloated.  Likewise meals heavy in dairy and vegetables can cause extra gas to form and this can also increase the bloating.  Drink plenty of fluids but you should avoid alcoholic beverages for 24 hours.  ACTIVITY: Your care partner should take you home directly after the procedure.  You should plan to take it easy, moving slowly for the rest of the day.  You can resume normal activity the day after the procedure however you should NOT DRIVE or use heavy machinery for 24 hours (because of the sedation medicines used during the test).    SYMPTOMS TO REPORT IMMEDIATELY: A gastroenterologist can be reached at any hour.  During normal business hours, 8:30 AM to 5:00 PM Monday through Friday,  call [phone].  After hours and on weekends, please call the GI answering service at [phone] who will take a message and have the physician on call contact you.   Following lower endoscopy (colonoscopy or flexible sigmoidoscopy):  Excessive amounts of blood in the stool  Significant tenderness or worsening of abdominal pains  Swelling of the abdomen that is new, acute  Fever of 100F or higher  Following upper endoscopy (EGD)  Vomiting of blood or coffee ground material  New chest pain or pain under the shoulder blades  Painful or persistently difficult swallowing  New shortness of breath  Fever of 100F or higher  Black, tarry-looking stools  FOLLOW UP: If any biopsies were taken you will be contacted by phone or by letter within the next 1-3 weeks.  Call your gastroenterologist if you have not heard about the biopsies in 3 weeks.  Our staff will call the home number listed on your records the next business day following your procedure to check on you and address any questions or concerns that you may have at that time regarding the information given to you following your procedure. This is a courtesy call and so if there is no answer at the home number and we have not heard from you through the emergency physician on call, we will assume that you have returned to your regular daily activities without incident.  SIGNATURES/CONFIDENTIALITY: You and/or your care   partner have signed paperwork which will be entered into your electronic medical record.  These signatures attest to the fact that that the information above on your After Visit Summary has been reviewed and is understood.  Full responsibility of the confidentiality of this discharge information lies with you and/or your care-partner.   Handouts on polyps, diverticulosis, high fiber diet  

## 2012-08-19 ENCOUNTER — Telehealth: Payer: Self-pay | Admitting: *Deleted

## 2012-08-19 NOTE — Telephone Encounter (Signed)
  Follow up Call-  Call back number 08/18/2012  Post procedure Call Back phone  # [phone]  Permission to leave phone message Yes     Patient questions:  Do you have a fever, pain , or abdominal swelling? no Pain Score  0 *  Have you tolerated food without any problems? yes  Have you been able to return to your normal activities? yes  Do you have any questions about your discharge instructions: Diet   no Medications  no Follow up visit  no  Do you have questions or concerns about your Care? no  Actions: * If pain score is 4 or above: No action needed, pain <4.

## 2012-08-27 ENCOUNTER — Encounter: Payer: Self-pay | Admitting: Gastroenterology

## 2012-09-10 ENCOUNTER — Other Ambulatory Visit: Payer: Self-pay | Admitting: Family Medicine

## 2012-09-10 MED ORDER — DILTIAZEM HCL 90 MG PO TABS: 90.0000 mg | ORAL_TABLET | Freq: Two times a day (BID) | ORAL | Status: DC

## 2012-09-10 MED ORDER — PRAVASTATIN SODIUM 20 MG PO TABS: 20.0000 mg | ORAL_TABLET | Freq: Every day | ORAL | Status: DC

## 2012-09-10 MED ORDER — LISINOPRIL-HYDROCHLOROTHIAZIDE 10-12.5 MG PO TABS: 1.0000 | ORAL_TABLET | Freq: Every day | ORAL | Status: DC

## 2012-09-10 MED ORDER — OMEPRAZOLE 20 MG PO CPDR: 20.0000 mg | DELAYED_RELEASE_CAPSULE | Freq: Every day | ORAL | Status: DC

## 2012-09-10 MED ORDER — METFORMIN HCL 500 MG PO TABS: 500.0000 mg | ORAL_TABLET | Freq: Two times a day (BID) | ORAL | Status: DC

## 2012-09-10 NOTE — Telephone Encounter (Signed)
Cancelled order sent to [facility] and sent the Rx's to [facility]      KP

## 2012-09-10 NOTE — Telephone Encounter (Signed)
Pt didn't know how to contact the mail order pharm to have her meds refilled - she stated she needed "all of them filled"

## 2012-10-13 ENCOUNTER — Ambulatory Visit: Payer: Medicare Other | Admitting: Family Medicine

## 2012-10-25 ENCOUNTER — Ambulatory Visit (INDEPENDENT_AMBULATORY_CARE_PROVIDER_SITE_OTHER): Payer: Medicare Other | Admitting: Family Medicine

## 2012-10-25 ENCOUNTER — Encounter: Payer: Self-pay | Admitting: Family Medicine

## 2012-10-25 VITALS — BP 120/74 | HR 78 | Temp 98.2°F | Resp 16 | Wt 186.0 lb

## 2012-10-25 DIAGNOSIS — E1142 Type 2 diabetes mellitus with diabetic polyneuropathy: Secondary | ICD-10-CM

## 2012-10-25 DIAGNOSIS — E785 Hyperlipidemia, unspecified: Secondary | ICD-10-CM

## 2012-10-25 DIAGNOSIS — I10 Essential (primary) hypertension: Secondary | ICD-10-CM

## 2012-10-25 DIAGNOSIS — Z23 Encounter for immunization: Secondary | ICD-10-CM

## 2012-10-25 DIAGNOSIS — Z79899 Other long term (current) drug therapy: Secondary | ICD-10-CM

## 2012-10-25 DIAGNOSIS — E1149 Type 2 diabetes mellitus with other diabetic neurological complication: Secondary | ICD-10-CM

## 2012-10-25 LAB — BASIC METABOLIC PANEL
CO2: 29 mEq/L (ref 19–32)
Calcium: 9 mg/dL (ref 8.4–10.5)
Glucose, Bld: 142 mg/dL — ABNORMAL HIGH (ref 70–99)
Potassium: 4 mEq/L (ref 3.5–5.1)
Sodium: 138 mEq/L (ref 135–145)

## 2012-10-25 LAB — HEPATIC FUNCTION PANEL
AST: 19 U/L (ref 0–37)
Alkaline Phosphatase: 54 U/L (ref 39–117)
Bilirubin, Direct: 0.1 mg/dL (ref 0.0–0.3)
Total Protein: 6.8 g/dL (ref 6.0–8.3)

## 2012-10-25 LAB — LIPID PANEL
HDL: 36 mg/dL — ABNORMAL LOW (ref >39.00)
Total CHOL/HDL Ratio: 5
VLDL: 37.2 mg/dL (ref 0.0–40.0)

## 2012-10-25 LAB — HEMOGLOBIN A1C: Hgb A1c MFr Bld: 7.3 % — ABNORMAL HIGH (ref 4.6–6.5)

## 2012-10-25 NOTE — Progress Notes (Signed)
  Subjective:    Patient ID: Melanie [name], female    DOB: 03/18/1941, 71 y.o.   MRN: [id number]  HPI DM- chronic problem, on Metformin.  [facility]  Not checking CBGs.  UTD on eye exam.  No CP, SOB, HAs, visual changes, edema.  + 'tingling' of feet- predated DM.  On ACE  Hyperlipidemia- chronic problem, on Pravastatin.  Was recommended to increase to 40mg  based on last labs but pt continued 20mg .  No abd pain, N/V, myalgias.  HTN- well controlled today.  On Lisinopril HCT.  Asymptomatic.  Pt w/ known SEM.   Review of Systems For ROS see HPI     Objective:   Physical Exam  Vitals reviewed. Constitutional: She is oriented to person, place, and time. She appears well-developed and well-nourished. No distress.  HENT:  Head: Normocephalic and atraumatic.  Eyes: Conjunctivae normal and EOM are normal. Pupils are equal, round, and reactive to light.  Neck: Normal range of motion. Neck supple. No thyromegaly present.  Cardiovascular: Normal rate, regular rhythm and intact distal pulses.   Murmur (II/VI SEM at RUSB) heard. Pulmonary/Chest: Effort normal and breath sounds normal. No respiratory distress.  Abdominal: Soft. She exhibits no distension. There is no tenderness.  Musculoskeletal: She exhibits no edema.  Lymphadenopathy:    She has no cervical adenopathy.  Neurological: She is alert and oriented to person, place, and time.  Skin: Skin is warm and dry.  Psychiatric: She has a normal mood and affect. Her behavior is normal.          Assessment & Plan:

## 2012-10-25 NOTE — Assessment & Plan Note (Signed)
Chronic problem.  Tolerating meds w/out difficulty.  Check labs.  Adjust meds prn  

## 2012-10-25 NOTE — Assessment & Plan Note (Signed)
Chronic problem.  Well controlled today on current meds.  Asymptomatic.  No changes. 

## 2012-10-25 NOTE — Patient Instructions (Addendum)
Follow up in 3-4 months to recheck diabetes We'll notify you of your lab results and make any changes if needed Keep up the good work!  You look great! Call with any questions or concerns Happy Fall! 

## 2012-10-25 NOTE — Assessment & Plan Note (Signed)
Chronic problem.  UTD on eye exam.  Asymptomatic.  Neuropathy unchanged.  Check labs.  Adjust meds prn

## 2012-10-29 ENCOUNTER — Telehealth: Payer: Self-pay | Admitting: *Deleted

## 2012-10-29 NOTE — Telephone Encounter (Signed)
Pt left VM requesting lab results. Discuss with patient results, and advise copy has been mailed.

## 2012-12-01 ENCOUNTER — Emergency Department (HOSPITAL_BASED_OUTPATIENT_CLINIC_OR_DEPARTMENT_OTHER)
Admission: EM | Admit: 2012-12-01 | Discharge: 2012-12-01 | Disposition: A | Discharge status: Home / Self Care | Payer: Medicare Other | Attending: Emergency Medicine | Admitting: Emergency Medicine

## 2012-12-01 ENCOUNTER — Emergency Department (HOSPITAL_BASED_OUTPATIENT_CLINIC_OR_DEPARTMENT_OTHER): Payer: Medicare Other

## 2012-12-01 ENCOUNTER — Telehealth: Payer: Self-pay | Admitting: Family Medicine

## 2012-12-01 ENCOUNTER — Encounter (HOSPITAL_BASED_OUTPATIENT_CLINIC_OR_DEPARTMENT_OTHER): Payer: Self-pay | Admitting: *Deleted

## 2012-12-01 DIAGNOSIS — W010XXA Fall on same level from slipping, tripping and stumbling without subsequent striking against object, initial encounter: Secondary | ICD-10-CM | POA: Insufficient documentation

## 2012-12-01 DIAGNOSIS — S0101XA Laceration without foreign body of scalp, initial encounter: Secondary | ICD-10-CM

## 2012-12-01 DIAGNOSIS — Y92009 Unspecified place in unspecified non-institutional (private) residence as the place of occurrence of the external cause: Secondary | ICD-10-CM | POA: Insufficient documentation

## 2012-12-01 DIAGNOSIS — E119 Type 2 diabetes mellitus without complications: Secondary | ICD-10-CM | POA: Insufficient documentation

## 2012-12-01 DIAGNOSIS — W19XXXA Unspecified fall, initial encounter: Secondary | ICD-10-CM

## 2012-12-01 DIAGNOSIS — Z79899 Other long term (current) drug therapy: Secondary | ICD-10-CM | POA: Insufficient documentation

## 2012-12-01 DIAGNOSIS — S0100XA Unspecified open wound of scalp, initial encounter: Secondary | ICD-10-CM | POA: Insufficient documentation

## 2012-12-01 DIAGNOSIS — I10 Essential (primary) hypertension: Secondary | ICD-10-CM | POA: Insufficient documentation

## 2012-12-01 DIAGNOSIS — Y939 Activity, unspecified: Secondary | ICD-10-CM | POA: Insufficient documentation

## 2012-12-01 DIAGNOSIS — S0990XA Unspecified injury of head, initial encounter: Secondary | ICD-10-CM | POA: Insufficient documentation

## 2012-12-01 DIAGNOSIS — E785 Hyperlipidemia, unspecified: Secondary | ICD-10-CM | POA: Insufficient documentation

## 2012-12-01 MED ORDER — LIDOCAINE-EPINEPHRINE 2 %-1:100000 IJ SOLN
INTRAMUSCULAR | Status: AC
  Administered 2012-12-01: 15:00:00
  Filled 2012-12-01: qty 1

## 2012-12-01 NOTE — ED Notes (Signed)
Patient transported to and from CT.

## 2012-12-01 NOTE — ED Notes (Signed)
Pt c/o fall from standing landing on brick, laceration to posterior head , also c/o increased bp

## 2012-12-01 NOTE — Telephone Encounter (Signed)
Patient Information:  Caller Name: Melanie [name]  Phone: [phone]  Patient: Melanie [name]  Gender: Female  DOB: Feb 19, 1941  Age: 71 Years  PCP: Sheliah Hatch.  Office Follow Up:  Does the office need to follow up with this patient?: No  Instructions For The Office: N/A  RN Note:  she tripped and lost footing and fell hitting back of head on she thinks the door jam.  No LOC but states she heard bells.  She is alert and oriented.  She has laceration to back of head, her hair is wet with blood, she thinks it has stopped.  Her son is there and he said he can't really tell how bad it is.  Symptoms  Reason For Call & Symptoms: fell and hit head  Reviewed Health History In EMR: Yes  Reviewed Medications In EMR: Yes  Reviewed Allergies In EMR: Yes  Reviewed Surgeries / Procedures: Yes  Date of Onset of Symptoms: 12/01/2012  Guideline(s) Used:  Head Injury  Disposition Per Guideline:   Go to ED Now (or to Office with PCP Approval)  Reason For Disposition Reached:   Skin is split open or gaping (length > 1/2 inch or 12 mm)  Advice Given:  N/A  She is going to go to ED for evaluation

## 2012-12-01 NOTE — ED Provider Notes (Signed)
History     CSN: [id number]  Arrival date & time 12/01/12  1350   First MD Initiated Contact with Patient 12/01/12 1422      Chief Complaint  Patient presents with  . Head Injury    (Consider location/radiation/quality/duration/timing/severity/associated sxs/prior treatment) HPI Comments: Pt states that she slipped and fell coming out of her house and landed on her back and hit the back of her head:no loc or dizziness associated with the fall:pt states that she has a laceration to the back of her scalp  Patient is a 71 y.o. female presenting with head injury. The history is provided by the patient. No language interpreter was used.  Head Injury  The incident occurred 3 to 5 hours ago. She came to the ER via walk-in. The injury mechanism was a direct blow. There was no loss of consciousness. The volume of blood lost was minimal. The pain is mild. The pain has been constant since the injury. Pertinent negatives include no numbness, no blurred vision, no vomiting, no disorientation and no weakness.    Past Medical History  Diagnosis Date  . Diabetes mellitus   . Hyperlipidemia   . Hypertension     Past Surgical History  Procedure Date  . Rotator cuff repair 2010    right  . Fracture surgery     left leg  . Hiatal hernia surgery 2010    Family History  Problem Relation Age of Onset  . Colon cancer Brother 4  . Colon cancer Maternal Aunt 50  . Stomach cancer Neg Hx     History  Substance Use Topics  . Smoking status: Never Smoker   . Smokeless tobacco: Never Used  . Alcohol Use: No    OB History    Grav Para Term Preterm Abortions TAB SAB Ect Mult Living                  Review of Systems  Constitutional: Negative.   Eyes: Negative for blurred vision and visual disturbance.  Respiratory: Negative.   Cardiovascular: Negative.   Gastrointestinal: Negative for vomiting.  Neurological: Negative for weakness and numbness.    Allergies  Niacin and  related  Home Medications   Current Outpatient Rx  Name  Route  Sig  Dispense  Refill  . AZELASTINE HCL 137 MCG/SPRAY NA SOLN   Nasal   Place 2 sprays into the nose 2 (two) times daily. Use in each nostril as directed   30 mL   1   . VITAMIN D PO   Oral   Take by mouth daily.         [name] DILTIAZEM HCL 90 MG PO TABS   Oral   Take 1 tablet (90 mg total) by mouth 2 (two) times daily.   180 tablet   3   . OMEGA-3 FATTY ACIDS 1000 MG PO CAPS   Oral   Take 2 g by mouth daily.         [name] LISINOPRIL-HYDROCHLOROTHIAZIDE 10-12.5 MG PO TABS   Oral   Take 1 tablet by mouth daily.   90 tablet   3   . MELOXICAM 15 MG PO TABS   Oral   Take 1 tablet (15 mg total) by mouth daily.   30 tablet   3   . METFORMIN HCL 500 MG PO TABS   Oral   Take 1 tablet (500 mg total) by mouth 2 (two) times daily with a meal.   180 tablet  3   . OMEPRAZOLE 20 MG PO CPDR   Oral   Take 1 capsule (20 mg total) by mouth daily.   90 capsule   3   . PRAVASTATIN SODIUM 20 MG PO TABS   Oral   Take 1 tablet (20 mg total) by mouth daily.   90 tablet   3     BP 141/77  Pulse 88  Resp 16  Ht 5\' 4"  (1.626 m)  Wt 185 lb (83.915 kg)  BMI 31.76 kg/m2  SpO2 100%  Physical Exam  Nursing note and vitals reviewed. Constitutional: She is oriented to person, place, and time. She appears well-developed and well-nourished.  HENT:  Right Ear: External ear normal.  Left Ear: External ear normal.       2 cm laceration to the left posterior scalp  Eyes: Conjunctivae normal and EOM are normal. Pupils are equal, round, and reactive to light.  Neck: Normal range of motion. Neck supple.  Cardiovascular: Normal rate and regular rhythm.   Pulmonary/Chest: Breath sounds normal.  Abdominal: Soft. Bowel sounds are normal. There is no tenderness.  Musculoskeletal:       Cervical back: She exhibits tenderness.       Thoracic back: Normal.       Lumbar back: Normal.  Neurological: She is alert and oriented to  person, place, and time. Coordination normal.  Skin:       Laceration to left posterior scalp  Psychiatric: She has a normal mood and affect.    ED Course  LACERATION REPAIR Performed by: Teressa Lower Authorized by: Teressa Lower Risks and benefits: risks, benefits and alternatives were discussed Consent given by: patient Patient identity confirmed: verbally with patient Time out: Immediately prior to procedure a "time out" was called to verify the correct patient, procedure, equipment, support staff and site/side marked as required. Body area: head/neck Location details: scalp Laceration length: 2 cm Foreign bodies: no foreign bodies Anesthesia: local infiltration Local anesthetic: lidocaine 1% with epinephrine Irrigation solution: saline Skin closure: staples Approximation: close Approximation difficulty: simple Comments: Pt tolerated without any problem   (including critical care time)  Labs Reviewed - No data to display Dg Cervical Spine Complete  12/01/2012  *RADIOLOGY REPORT*  Clinical Data: Larey Seat striking back of head, laceration, neck pain  CERVICAL SPINE - COMPLETE 4+ VIEW  Comparison: None  Findings: Prevertebral soft tissues normal thickness. Bones appear mildly demineralized. Vertebral body and disc space heights maintained. Minimal anterior endplate spur formation at inferior C5 and C6. No acute fracture, subluxation or bone destruction. Multilevel facet degenerative changes bilaterally. Encroachment upon bilateral cervical neural foramina at C3-C4 as well as left C5-C6 by combinations of facet and uncovertebral hypertrophy. Lung apices clear. Atherosclerotic calcifications aorta.  IMPRESSION: Osseous demineralization. 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 No acute abnormalities.   Original Report Authenticated By: Ulyses Southward, M.D.    Ct Head Wo Contrast  12/01/2012  *RADIOLOGY REPORT*  Clinical Data: Fall striking back of head, laceration,  history hypertension, diabetes, hyperlipidemia  CT HEAD WITHOUT CONTRAST  Technique:  Contiguous axial images were obtained from the base of the skull through the vertex without contrast.  Comparison: None  Findings: Skin clips at a posterior left parietal scalp laceration. Generalized atrophy. Normal ventricular morphology. No midline shift or mass effect. Otherwise normal appearance of brain parenchyma. No intracranial hemorrhage, mass lesion, or evidence of acute infarction. No extra-axial fluid collections. Atherosclerotic calcifications at skull base. Partial opacification of right sphenoid sinus.  Calvaria intact.  IMPRESSION: Mild generalized atrophy. No acute intracranial abnormalities. Sphenoid sinus disease.   Original Report Authenticated By: Ulyses Southward, M.D.      1. Head injury   2. Scalp laceration   3. Fall       MDM  Wound closed no acute injury noted on films:pt is neurologically intact        Teressa Lower, NP 12/01/12 1542

## 2012-12-02 ENCOUNTER — Telehealth: Payer: Self-pay | Admitting: Family Medicine

## 2012-12-02 NOTE — Telephone Encounter (Signed)
Patient Information:  Caller Name: Yanilen  Phone: [phone]  Patient: Melanie [name]  Gender: Female  DOB: 12/11/1941  Age: 72 Years  PCP: Sheliah Hatch.  Office Follow Up:  Does the office need to follow up with this patient?: No  Instructions For The Office: N/A  RN Note:  Transferred patient to the office to schedule Staple Removal  Symptoms  Reason For Call & Symptoms: Patient received x3 staples to back of her head. She has questions concerning care and removal.  Reviewed EPIC information and discharge instructions from the ER visit with the patient . Understanding expressed. Transferred to office to scheduel a Staple Removal appointment  Reviewed Health History In EMR: Yes  Reviewed Medications In EMR: Yes  Reviewed Allergies In EMR: Yes  Reviewed Surgeries / Procedures: Yes  Date of Onset of Symptoms: 12/01/2012  Guideline(s) Used:  Skin Injury  Disposition Per Guideline:   Home Care  Reason For Disposition Reached:   Swelling, bruise, or pain from a direct blow  Advice Given:  Pain Medicines:  For pain relief, you can take either acetaminophen, ibuprofen, or naproxen.  They are over-the-counter (OTC) pain drugs. You can buy them at the drugstore.  Acetaminophen (e.g., Tylenol):  Regular Strength Tylenol: Take 650 mg (two 325 mg pills) by mouth every 4-6 hours as needed. Each Regular Strength Tylenol pill has 325 mg of acetaminophen.  Call Back If:  Swelling or bruise becomes over 2 inches (5 cm).  Pain not improved after 72 hours  Pain or swelling lasts over 7 days  You become worse.

## 2012-12-02 NOTE — Telephone Encounter (Signed)
Apt scheduled for 12/07/12 with Dr.Tabori.       KP

## 2012-12-03 NOTE — ED Provider Notes (Signed)
Medical screening examination/treatment/procedure(s) were conducted as a shared visit with non-physician practitioner(s) and myself.  I personally evaluated the patient during the encounter   Rolan Bucco, MD 12/03/12 1459

## 2012-12-07 ENCOUNTER — Encounter: Payer: Self-pay | Admitting: Family Medicine

## 2012-12-07 ENCOUNTER — Ambulatory Visit (INDEPENDENT_AMBULATORY_CARE_PROVIDER_SITE_OTHER): Payer: Medicare Other | Admitting: Family Medicine

## 2012-12-07 VITALS — BP 130/70 | HR 67 | Temp 97.8°F | Ht 64.25 in | Wt 188.8 lb

## 2012-12-07 DIAGNOSIS — S0191XA Laceration without foreign body of unspecified part of head, initial encounter: Secondary | ICD-10-CM

## 2012-12-07 DIAGNOSIS — S0190XA Unspecified open wound of unspecified part of head, initial encounter: Secondary | ICD-10-CM

## 2012-12-07 NOTE — Progress Notes (Signed)
  Subjective:    Patient ID: Melanie [name], female    DOB: 04-22-1941, 71 y.o.   MRN: [id number]  HPI Head lac- pt fell 12/11 after stepping out her back door to feed her cats.  Had 4 cm head lac posteriorly that required 3 staples.  Pt reports she continues to have faint HA- improves w/ ibuprofen.  No dizziness, ringing in ears, confusion.  Review of Systems For ROS see HPI     Objective:   Physical Exam  Vitals reviewed. Constitutional: She appears well-developed and well-nourished. No distress.  HENT:       No hematoma present Mild TTP over 4 cm laceration Laceration is well approximated and healing nicely 3 staples removed          Assessment & Plan:

## 2012-12-07 NOTE — Assessment & Plan Note (Signed)
New to provider, well healing.  No evidence of infxn.  Staples removed, area cleaned.  Wound care instructions given.

## 2013-01-27 ENCOUNTER — Encounter: Payer: Self-pay | Admitting: Family Medicine

## 2013-01-27 ENCOUNTER — Ambulatory Visit (INDEPENDENT_AMBULATORY_CARE_PROVIDER_SITE_OTHER): Payer: Medicare Other | Admitting: Family Medicine

## 2013-01-27 VITALS — BP 140/70 | HR 66 | Temp 97.9°F | Ht 64.0 in | Wt 191.4 lb

## 2013-01-27 DIAGNOSIS — E1149 Type 2 diabetes mellitus with other diabetic neurological complication: Secondary | ICD-10-CM

## 2013-01-27 DIAGNOSIS — I10 Essential (primary) hypertension: Secondary | ICD-10-CM

## 2013-01-27 DIAGNOSIS — E1142 Type 2 diabetes mellitus with diabetic polyneuropathy: Secondary | ICD-10-CM

## 2013-01-27 LAB — BASIC METABOLIC PANEL
CO2: 28 mEq/L (ref 19–32)
Calcium: 8.9 mg/dL (ref 8.4–10.5)
GFR: 72.93 mL/min (ref >60.00)
Potassium: 3.9 mEq/L (ref 3.5–5.1)
Sodium: 138 mEq/L (ref 135–145)

## 2013-01-27 NOTE — Assessment & Plan Note (Signed)
Chronic problem.  Tolerating meds w/out difficulty.  Has eye exam upcoming.  + neuropathy but no worse than previous.  Check labs.  Adjust meds prn

## 2013-01-27 NOTE — Progress Notes (Signed)
  Subjective:    Patient ID: Melanie [name], female    DOB: Nov 28, 1941, 72 y.o.   MRN: [id number]  HPI  DM- chronic problem, on Metformin.  Not checking CBGs.  Denies symptomatic lows.  No N/V/D.  Has eye exam upcoming.  + tingling of feet- 'but they haven't got any worse in the last 10 yrs'.  HTN- chronic problem, elevated today but pt missed exit, tried turning around, got lost.  Very upset b/c she missed appt.  On Dilt, Lisinopril/HCT.  No CP, SOB, HAs, visual changes, edema   Review of Systems For ROS see HPI     Objective:   Physical Exam  Vitals reviewed. Constitutional: She is oriented to person, place, and time. She appears well-developed and well-nourished. No distress.  HENT:  Head: Normocephalic and atraumatic.  Eyes: Conjunctivae normal and EOM are normal. Pupils are equal, round, and reactive to light.  Neck: Normal range of motion. Neck supple. No thyromegaly present.  Cardiovascular: Normal rate, regular rhythm, normal heart sounds and intact distal pulses.   No murmur heard. Pulmonary/Chest: Effort normal and breath sounds normal. No respiratory distress.  Abdominal: Soft. She exhibits no distension. There is no tenderness.  Musculoskeletal: She exhibits no edema.  Lymphadenopathy:    She has no cervical adenopathy.  Neurological: She is alert and oriented to person, place, and time.  Skin: Skin is warm and dry.  Psychiatric: She has a normal mood and affect. Her behavior is normal.          Assessment & Plan:

## 2013-01-27 NOTE — Assessment & Plan Note (Signed)
Chronic problem.  Elevated today.  Pt very upset about arriving late for appt.  Upon recheck- BP had improved.  Pt is asymptomatic.  She is to check BP at home and notify me if persistently elevated.  Will follow.

## 2013-01-27 NOTE — Patient Instructions (Addendum)
Schedule your complete physical for July We'll notify you of your lab results and make any changes if needed Check your BP at home- if regularly higher than 140/90, call me Call with any questions or concerns Happy New Year!!!

## 2013-01-28 ENCOUNTER — Encounter: Payer: Self-pay | Admitting: *Deleted

## 2013-02-01 ENCOUNTER — Ambulatory Visit: Payer: Medicare Other | Admitting: Family Medicine

## 2013-03-14 ENCOUNTER — Telehealth: Payer: Self-pay | Admitting: Family Medicine

## 2013-04-11 ENCOUNTER — Encounter: Payer: Self-pay | Admitting: Internal Medicine

## 2013-04-11 ENCOUNTER — Ambulatory Visit (INDEPENDENT_AMBULATORY_CARE_PROVIDER_SITE_OTHER): Payer: Medicare Other | Admitting: Internal Medicine

## 2013-04-11 VITALS — BP 124/76 | HR 86 | Temp 98.2°F | Wt 193.0 lb

## 2013-04-11 DIAGNOSIS — I10 Essential (primary) hypertension: Secondary | ICD-10-CM

## 2013-04-11 DIAGNOSIS — R220 Localized swelling, mass and lump, head: Secondary | ICD-10-CM

## 2013-04-11 DIAGNOSIS — R221 Localized swelling, mass and lump, neck: Secondary | ICD-10-CM

## 2013-04-11 DIAGNOSIS — L259 Unspecified contact dermatitis, unspecified cause: Secondary | ICD-10-CM

## 2013-04-11 MED ORDER — HYDROCHLOROTHIAZIDE 12.5 MG PO CAPS: 12.5000 mg | ORAL_CAPSULE | Freq: Every day | ORAL | Status: DC

## 2013-04-11 MED ORDER — MOMETASONE FUROATE 0.1 % EX OINT: TOPICAL_OINTMENT | Freq: Two times a day (BID) | CUTANEOUS | Status: DC

## 2013-04-11 NOTE — Patient Instructions (Addendum)
Swelling of the mouth area can occur with the blood pressure pill.  Stop the Lisinopril/HCT. Apply Cort Aid OTC twice a day to the face. Do not get this topical steroid into eyes. Use hypoallergenic cleansing motions. Zyrtec @bedtime .

## 2013-04-11 NOTE — Progress Notes (Signed)
  Subjective:    Patient ID: Melanie [name], female    DOB: Jan 13, 1941, 72 y.o.   MRN: [id number]  HPI She worked in her yard 4/19 and 4/20. She developed a faint pruritic rash over her shins 4/20 and similar symptoms over the forehead this morning. She has had some swelling around her eyes and chin.  She used a hand sensitizer with some benefit for localized rash forms. This has been the presentation of her poison ivy in the past.  She's had some discomfort in the left ear without associated discharge. She's also had some frontal discomfort  She has a history of hives with niacin.  She is on lisinopril/HCT.     Review of Systems   She denies fever, chills, or sweats. She's had no facial pain or nasal purulence.  She denies shortness of breath or wheezing.     Objective:   Physical Exam General appearance:well nourished; no acute distress or increased work of breathing is present.  No  lymphadenopathy about the head, neck, or axilla noted.   Eyes: No conjunctival inflammation or lid edema is present. Subtle lid edema is present. Extraocular motion is intact  Ears:  External ear exam shows no significant lesions or deformities.  Otoscopic examination reveals clear canals, tympanic membranes are intact bilaterally without bulging, retraction, inflammation or discharge.  Nose:  External nasal examination shows no deformity or inflammation. Nasal mucosa are pink and moist without lesions or exudates. No septal dislocation or deviation.No obstruction to airflow.   Oral exam: Dentures; lips and gums are healthy appearing.There is no oropharyngeal erythema or exudate noted.   Neck:  No deformities, masses, or tenderness noted.   Supple with full range of motion without pain.   Heart:  Normal rate and regular rhythm. S1 and S2 normal without gallop, click, rub or other extra sounds. Grade 1/6 systolic murmur  Lungs:Chest clear to auscultation; no wheezes, rhonchi,rales ,or rubs  present.No increased work of breathing.  Breath sounds are decreased  Extremities:  No cyanosis, edema, or clubbing  noted    Skin: Warm & dry . She has faint scattered linear erythema over the shins. The erythema and slight edema is more pronounced of the ventricle forearms. Faint erythema is present greatest over the left cheek.        Assessment & Plan:  #1 possible contact dermatitis from poison ivy. Of greatest concern is the periorbital edema and periorbital swelling in the context of ACE inhibitor therapy.  #2 diabetes; she is not a candidate for steroids orally. Most recent A1c was 6.9%  Plan: Lisinopril HCT will be changed to HCT. She'll be asked to followup with Dr Beverely Low. Topical steroids will be recommended for the rash along with Zyrtec at bedtime.

## 2013-07-17 ENCOUNTER — Other Ambulatory Visit: Payer: Self-pay | Admitting: Internal Medicine

## 2013-07-18 ENCOUNTER — Other Ambulatory Visit: Payer: Self-pay | Admitting: *Deleted

## 2013-07-18 DIAGNOSIS — I10 Essential (primary) hypertension: Secondary | ICD-10-CM

## 2013-07-18 MED ORDER — HYDROCHLOROTHIAZIDE 12.5 MG PO CAPS: 12.5000 mg | ORAL_CAPSULE | Freq: Every day | ORAL | Status: DC

## 2013-07-18 NOTE — Telephone Encounter (Signed)
Rx refilled for 30 ct of HTCZ 07/18/13

## 2013-07-25 ENCOUNTER — Encounter: Payer: Medicare Other | Admitting: Family Medicine

## 2013-08-23 ENCOUNTER — Other Ambulatory Visit: Payer: Self-pay | Admitting: Internal Medicine

## 2013-08-24 NOTE — Telephone Encounter (Signed)
Med filled.  

## 2013-09-06 ENCOUNTER — Encounter: Payer: Self-pay | Admitting: Podiatry

## 2013-09-06 ENCOUNTER — Encounter: Payer: Self-pay | Admitting: Family Medicine

## 2013-09-06 ENCOUNTER — Ambulatory Visit (INDEPENDENT_AMBULATORY_CARE_PROVIDER_SITE_OTHER): Payer: Medicare Other | Admitting: Family Medicine

## 2013-09-06 ENCOUNTER — Ambulatory Visit (INDEPENDENT_AMBULATORY_CARE_PROVIDER_SITE_OTHER): Payer: BLUE CROSS/BLUE SHIELD | Admitting: Podiatry

## 2013-09-06 VITALS — BP 145/68 | HR 61 | Ht 64.0 in | Wt 184.0 lb

## 2013-09-06 VITALS — BP 140/80 | HR 66 | Temp 98.1°F | Ht 64.0 in | Wt 184.4 lb

## 2013-09-06 DIAGNOSIS — L089 Local infection of the skin and subcutaneous tissue, unspecified: Secondary | ICD-10-CM | POA: Insufficient documentation

## 2013-09-06 DIAGNOSIS — E1149 Type 2 diabetes mellitus with other diabetic neurological complication: Secondary | ICD-10-CM

## 2013-09-06 DIAGNOSIS — N390 Urinary tract infection, site not specified: Secondary | ICD-10-CM

## 2013-09-06 DIAGNOSIS — Z1331 Encounter for screening for depression: Secondary | ICD-10-CM

## 2013-09-06 DIAGNOSIS — E1142 Type 2 diabetes mellitus with diabetic polyneuropathy: Secondary | ICD-10-CM

## 2013-09-06 DIAGNOSIS — I10 Essential (primary) hypertension: Secondary | ICD-10-CM

## 2013-09-06 DIAGNOSIS — Z0000 Encounter for general adult medical examination without abnormal findings: Secondary | ICD-10-CM

## 2013-09-06 DIAGNOSIS — E785 Hyperlipidemia, unspecified: Secondary | ICD-10-CM

## 2013-09-06 DIAGNOSIS — Z23 Encounter for immunization: Secondary | ICD-10-CM

## 2013-09-06 LAB — HEPATIC FUNCTION PANEL
Bilirubin, Direct: 0.1 mg/dL (ref 0.0–0.3)
Total Bilirubin: 1 mg/dL (ref 0.3–1.2)
Total Protein: 7.1 g/dL (ref 6.0–8.3)

## 2013-09-06 LAB — CBC WITH DIFFERENTIAL/PLATELET
Basophils Absolute: 0.1 10*3/uL (ref 0.0–0.1)
Eosinophils Absolute: 0.2 10*3/uL (ref 0.0–0.7)
HCT: 40.7 % (ref 36.0–46.0)
Hemoglobin: 13.8 g/dL (ref 12.0–15.0)
Lymphs Abs: 1.4 10*3/uL (ref 0.7–4.0)
MCHC: 34 g/dL (ref 30.0–36.0)
Monocytes Absolute: 0.4 10*3/uL (ref 0.1–1.0)
Neutro Abs: 4.6 10*3/uL (ref 1.4–7.7)
Platelets: 185 10*3/uL (ref 150.0–400.0)
RDW: 13.1 % (ref 11.5–14.6)

## 2013-09-06 LAB — LIPID PANEL
Cholesterol: 203 mg/dL — ABNORMAL HIGH (ref 0–200)
HDL: 37.2 mg/dL — ABNORMAL LOW (ref >39.00)
Total CHOL/HDL Ratio: 5
Triglycerides: 158 mg/dL — ABNORMAL HIGH (ref 0.0–149.0)

## 2013-09-06 LAB — POCT URINALYSIS DIPSTICK
Blood, UA: NEGATIVE
Ketones, UA: NEGATIVE
Protein, UA: NEGATIVE
Spec Grav, UA: 1.02
pH, UA: 6.5

## 2013-09-06 LAB — MICROALBUMIN / CREATININE URINE RATIO
Creatinine,U: 127.5 mg/dL
Microalb Creat Ratio: 1.1 mg/g (ref 0.0–30.0)
Microalb, Ur: 1.8 mg/dL (ref 0.0–1.9)
Microalb, Ur: 1.4 mg/dL (ref 0.0–1.9)

## 2013-09-06 LAB — BASIC METABOLIC PANEL
Calcium: 9.1 mg/dL (ref 8.4–10.5)
Creatinine, Ser: 0.9 mg/dL (ref 0.4–1.2)
GFR: 62.96 mL/min (ref >60.00)
Sodium: 139 mEq/L (ref 135–145)

## 2013-09-06 MED ORDER — AMOXICILLIN-POT CLAVULANATE 875-125 MG PO TABS: 1.0000 | ORAL_TABLET | Freq: Two times a day (BID) | ORAL | Status: DC

## 2013-09-06 NOTE — Progress Notes (Signed)
  Subjective:    Patient ID: Melanie [name], female    DOB: [birth year], 72 y.o.   MRN: [id number]  HPI Here today for CPE.  Risk Factors: DM- chronic problem, on Metformin.  Denies symptomatic lows.  No CP, SOB, HAs, visual changes, edema, N/V/D.  No numbness/tingling of hands/feet.  Due for eye exam.   HTN- chronic problem, adequate control.  On HCTZ, Dilt.   Hyperlipidemia- chronic problem, pt has stopped pravastatin.  'everybody says it's bad for you'.  Started OTC supplements instead, 'i'm not even what i'm taking'. Foreign body L foot- stepped on glass 1 month ago.  Has been 'digging out pieces' since.  No pain unless directly pressed on.  Now w/ dark rim around the area.  Has been cleaning w/ peroxide. Physical Activity: was previously swimming, plans to resume. Depression: no depression Hearing: normal to conversational tones, decreased to whispered voice ADL's: independent Cognitive: normal linear thought process, memory and attention intact Home Safety: safe at home, lives w/ son Tasia Catchings) Height, Weight, BMI, Visual Acuity: see vitals, vision corrected to 20/20 w/ glasses Counseling: UTD on pap, [facility] ([facility]), colonoscopy, DEXA Labs Ordered: See A&P Care Plan: See A&P    Review of Systems Patient reports no vision/ hearing changes, adenopathy,fever, weight change,  persistant/recurrent hoarseness , swallowing issues, chest pain, palpitations, edema, persistant/recurrent cough, hemoptysis, dyspnea (rest/exertional/paroxysmal nocturnal), gastrointestinal bleeding (melena, rectal bleeding), abdominal pain, significant heartburn, bowel changes, GU symptoms (dysuria, hematuria, incontinence), Gyn symptoms (abnormal  bleeding, pain),  syncope, focal weakness, memory loss, numbness & tingling, skin/hair/nail changes, abnormal bruising or bleeding, anxiety, or depression.     Objective:   Physical Exam General Appearance:    Alert, cooperative, no distress, appears stated age   Head:    Normocephalic, without obvious abnormality, atraumatic  Eyes:    PERRL, conjunctiva/corneas clear, EOM's intact, fundi    benign, both eyes  Ears:    Normal TM's and external ear canals, both ears  Nose:   Nares normal, septum midline, mucosa normal, no drainage    or sinus tenderness  Throat:   Lips, mucosa, and tongue normal; teeth and gums normal  Neck:   Supple, symmetrical, trachea midline, no adenopathy;    Thyroid: no enlargement/tenderness/nodules  Back:     Symmetric, no curvature, ROM normal, no CVA tenderness  Lungs:     Clear to auscultation bilaterally, respirations unlabored  Chest Wall:    No tenderness or deformity   Heart:    Regular rate and rhythm, S1 and S2 normal, no murmur, rub   or gallop  Breast Exam:    Deferred to mammo  Abdomen:     Soft, non-tender, bowel sounds active all four quadrants,    no masses, no organomegaly  Genitalia:    Deferred  Rectal:    Extremities:   no cyanosis or edema.  L plantar surface w/ palpable foreign body surrounded by dark, indurated edges.  + TTP  Pulses:   2+ and symmetric all extremities  Skin:   Skin color, texture, turgor normal, no rashes or lesions  Lymph nodes:   Cervical, supraclavicular, and axillary nodes normal  Neurologic:   CNII-XII intact, normal strength, sensation and reflexes    throughout          Assessment & Plan:

## 2013-09-06 NOTE — Patient Instructions (Addendum)
Seen for foreign body induced skin lesion left foot. Foreign body removed. Base of the area is clean and no sign of deep penetration.  Due to neuropathy did not need local anesthetics.  Area is clean and has no sign of deep infection.  Keep it clean and dry, and cover with band aid till it completely dry out. Return in one week for follow up.

## 2013-09-06 NOTE — Patient Instructions (Signed)
Follow up in 3 months to recheck sugar [facility] at [phone] to schedule your mammo Schedule your eye exam We'll call you with your podiatry appt Start the Augmentin twice daily- take w/ food- for the foot infection We'll notify you of your lab results and make any changes if needed Call with any questions or concerns Happy Fall!

## 2013-09-06 NOTE — Progress Notes (Signed)
Subjective: 72 y.o. year old female patient presents complaining of painful area x 1 month. She felt like a piece of glass when it was touched. She does not feel pain from it.  She was told that she has diabetic neuropathy. Stated that her blood sugar is under control.   Patient Summary List & History reviewed for allergies, medications, medical problems and surgical history.  Review of Systems - General ROS: negative for - chills, fatigue, fever, hot flashes, night sweats, sleep disturbance, weight gain or weight loss Ophthalmic ROS: Been under care for new glasses. ENT ROS: negative Allergy and Immunology ROS: negative Hematological and Lymphatic ROS: negative Endocrine ROS: negative Breast ROS: negative for breast lumps Respiratory ROS: no cough, shortness of breath, or wheezing Gastrointestinal ROS: Has esophageal reflux and under care. Musculoskeletal ROS: negative Neurological ROS: Having stressful situation with a friend having terminal cancer. She stays up day and night watching over her friend.  Dermatological ROS: Was diagnosed with pre cancerous skin lesion that been taken care of.   Objective: Dermatologic:  1cm circular lesion covered with dark old blood and callus under plantar lateral mid pont of left foot.  After removal of outer layer, expelled a glass like foreign body 0.2x0.5cm). No associated edema, erythema or sign of infection noted.  Vascular: Pedal pulses are all palpable.   Orthopedic:  Mild bunion bilateral. No other gross deformities.  Neurologic:  Decreased response to Monofilament and vibratory sensation test on right > left foot. DTR of ankle and knee are within normal.   Assessment: Foreign body (0.2x0.5cm glass like) left foot limited to subdermal layer, covered with dry blood and callused skin without associated edema or erythema. Upon removal of the lesion, clean subdermal base noted without damage to deeper level. No drainage or inflammatory  reaction noted.  Diabetic Neuropathy.  Treatment: Foreign body, 0.2x0.5 glass like object removed after cleansing with Betadine solution. Did not require local anesthetics. Patient is to keep the area covered while taking shower, then clean and cover with band aid.  Will check back in one week.

## 2013-09-09 LAB — URINE CULTURE: Colony Count: >=100000

## 2013-09-09 MED ORDER — CEPHALEXIN 500 MG PO CAPS: 500.0000 mg | ORAL_CAPSULE | Freq: Two times a day (BID) | ORAL | Status: DC

## 2013-09-12 NOTE — Assessment & Plan Note (Signed)
New.  Start abx.  Refer to podiatry for immediate evaluation and tx.  Pt expressed understanding and is in agreement w/ plan.

## 2013-09-12 NOTE — Assessment & Plan Note (Signed)
Pt's PE WNL w/ exception of foreign body in L foot.  UTD on health maintenance.  Check labs.  Anticipatory guidance provided.

## 2013-09-12 NOTE — Assessment & Plan Note (Signed)
Chronic problem.  Attempting to control off statin b/c pt is fearful of potential side effects.  Check labs- restart meds prn.

## 2013-09-12 NOTE — Assessment & Plan Note (Signed)
Chronic problem.  Hx of adequate control.  Due for eye exam- encouraged her to schedule.  Pt to go to podiatry for immediate foot evaluation due to infection of foreign body.  Check labs.  Adjust meds prn

## 2013-09-12 NOTE — Assessment & Plan Note (Signed)
Chronic problem.  Adequate control.  asymptomatic.

## 2013-09-14 ENCOUNTER — Encounter: Payer: Self-pay | Admitting: Podiatry

## 2013-09-14 ENCOUNTER — Ambulatory Visit (INDEPENDENT_AMBULATORY_CARE_PROVIDER_SITE_OTHER): Payer: BLUE CROSS/BLUE SHIELD | Admitting: Podiatry

## 2013-09-14 VITALS — BP 144/70 | HR 64 | Ht 64.0 in | Wt 184.0 lb

## 2013-09-14 DIAGNOSIS — M25579 Pain in unspecified ankle and joints of unspecified foot: Secondary | ICD-10-CM

## 2013-09-14 DIAGNOSIS — E1149 Type 2 diabetes mellitus with other diabetic neurological complication: Secondary | ICD-10-CM

## 2013-09-14 DIAGNOSIS — B351 Tinea unguium: Secondary | ICD-10-CM | POA: Insufficient documentation

## 2013-09-14 DIAGNOSIS — E1142 Type 2 diabetes mellitus with diabetic polyneuropathy: Secondary | ICD-10-CM

## 2013-09-14 HISTORY — DX: Pain in unspecified ankle and joints of unspecified foot: M25.579

## 2013-09-14 NOTE — Patient Instructions (Addendum)
Left foot where a small piece of glass was removed healed well without complication. Some nails are very thick and dystrophic. All nails debrided. Return as needed.

## 2013-09-14 NOTE — Progress Notes (Signed)
1 week follow up visit since removal of foreign body Left foot. She had the area covered with band aid.  The wound is clean with light callused tissue covered the base. The callus removed. Noted of dry healed skin base.  Positive of hypertrophic nails x 10. 2nd digits are dystrophic with fungal debris under the distal nail plate. All pulses are palpable. Severely contracted 2nd digits bilateral.  Assessment: Healed wound following removal of foreign body. Mycotic nails x 10.  Plan: Debrided all nails. Return as needed.

## 2013-09-19 ENCOUNTER — Encounter: Payer: Self-pay | Admitting: General Practice

## 2013-10-04 ENCOUNTER — Other Ambulatory Visit: Payer: Self-pay | Admitting: *Deleted

## 2013-10-04 MED ORDER — METFORMIN HCL 500 MG PO TABS: 500.0000 mg | ORAL_TABLET | Freq: Two times a day (BID) | ORAL | Status: DC

## 2013-10-04 MED ORDER — LISINOPRIL-HYDROCHLOROTHIAZIDE 10-12.5 MG PO TABS: 1.0000 | ORAL_TABLET | Freq: Every day | ORAL | Status: DC

## 2013-10-04 MED ORDER — DILTIAZEM HCL 90 MG PO TABS: 90.0000 mg | ORAL_TABLET | Freq: Two times a day (BID) | ORAL | Status: DC

## 2013-10-04 NOTE — Telephone Encounter (Signed)
Received a fax for lisinopril/HCTZ (not on med list) for pt, also have this dilatizem request. Please advise if pt is suppose to be on both or which one.

## 2013-10-04 NOTE — Telephone Encounter (Signed)
Pt to be on both meds.  Can have refills on both

## 2013-10-04 NOTE — Telephone Encounter (Signed)
Noted med filled.  

## 2013-10-10 ENCOUNTER — Other Ambulatory Visit: Payer: Self-pay | Admitting: General Practice

## 2013-10-10 MED ORDER — LISINOPRIL-HYDROCHLOROTHIAZIDE 10-12.5 MG PO TABS: 1.0000 | ORAL_TABLET | Freq: Every day | ORAL | Status: DC

## 2013-10-10 MED ORDER — METFORMIN HCL 500 MG PO TABS: 500.0000 mg | ORAL_TABLET | Freq: Two times a day (BID) | ORAL | Status: DC

## 2013-10-18 ENCOUNTER — Other Ambulatory Visit: Payer: Medicare Other

## 2013-10-24 ENCOUNTER — Other Ambulatory Visit (INDEPENDENT_AMBULATORY_CARE_PROVIDER_SITE_OTHER): Payer: Medicare Other

## 2013-10-24 ENCOUNTER — Encounter: Payer: Self-pay | Admitting: General Practice

## 2013-10-24 DIAGNOSIS — E785 Hyperlipidemia, unspecified: Secondary | ICD-10-CM

## 2013-10-24 LAB — HEPATIC FUNCTION PANEL
Albumin: 4.1 g/dL (ref 3.5–5.2)
Total Bilirubin: 0.8 mg/dL (ref 0.3–1.2)

## 2013-12-09 ENCOUNTER — Other Ambulatory Visit: Payer: Self-pay | Admitting: Internal Medicine

## 2013-12-09 LAB — HM DIABETES EYE EXAM: HM Diabetic Eye Exam: NORMAL

## 2013-12-09 NOTE — Telephone Encounter (Signed)
HCTZ refilled per protocol. JG//CMA 

## 2013-12-13 ENCOUNTER — Encounter: Payer: Self-pay | Admitting: Family Medicine

## 2013-12-23 ENCOUNTER — Other Ambulatory Visit: Payer: Self-pay | Admitting: *Deleted

## 2013-12-23 MED ORDER — OMEPRAZOLE 20 MG PO CPDR: 20.0000 mg | DELAYED_RELEASE_CAPSULE | Freq: Every day | ORAL | Status: DC

## 2013-12-23 NOTE — Telephone Encounter (Signed)
Patient called and stated that she would like to know if she could get her omeprazole refilled. Med filled

## 2014-01-17 ENCOUNTER — Other Ambulatory Visit: Payer: Self-pay | Admitting: General Practice

## 2014-01-17 ENCOUNTER — Ambulatory Visit (INDEPENDENT_AMBULATORY_CARE_PROVIDER_SITE_OTHER): Payer: Medicare HMO | Admitting: Family Medicine

## 2014-01-17 ENCOUNTER — Encounter: Payer: Self-pay | Admitting: Family Medicine

## 2014-01-17 VITALS — BP 132/80 | HR 61 | Temp 98.1°F | Resp 16 | Wt 192.0 lb

## 2014-01-17 DIAGNOSIS — E1149 Type 2 diabetes mellitus with other diabetic neurological complication: Secondary | ICD-10-CM

## 2014-01-17 DIAGNOSIS — L723 Sebaceous cyst: Secondary | ICD-10-CM

## 2014-01-17 DIAGNOSIS — E785 Hyperlipidemia, unspecified: Secondary | ICD-10-CM

## 2014-01-17 DIAGNOSIS — I10 Essential (primary) hypertension: Secondary | ICD-10-CM

## 2014-01-17 DIAGNOSIS — L720 Epidermal cyst: Secondary | ICD-10-CM

## 2014-01-17 LAB — BASIC METABOLIC PANEL
BUN: 16 mg/dL (ref 6–23)
CO2: 27 mEq/L (ref 19–32)
CREATININE: 0.9 mg/dL (ref 0.4–1.2)
Calcium: 9.1 mg/dL (ref 8.4–10.5)
Chloride: 105 mEq/L (ref 96–112)
GFR: 63.69 mL/min (ref >60.00)
GLUCOSE: 146 mg/dL — AB (ref 70–99)
Potassium: 3.9 mEq/L (ref 3.5–5.1)
Sodium: 139 mEq/L (ref 135–145)

## 2014-01-17 LAB — LIPID PANEL
Cholesterol: 167 mg/dL (ref 0–200)
HDL: 31.8 mg/dL — ABNORMAL LOW (ref >39.00)
LDL Cholesterol: 100 mg/dL — ABNORMAL HIGH (ref 0–99)
Total CHOL/HDL Ratio: 5
Triglycerides: 176 mg/dL — ABNORMAL HIGH (ref 0.0–149.0)
VLDL: 35.2 mg/dL (ref 0.0–40.0)

## 2014-01-17 LAB — HEMOGLOBIN A1C: Hgb A1c MFr Bld: 7 % — ABNORMAL HIGH (ref 4.6–6.5)

## 2014-01-17 LAB — HEPATIC FUNCTION PANEL
ALBUMIN: 4 g/dL (ref 3.5–5.2)
ALK PHOS: 63 U/L (ref 39–117)
ALT: 21 U/L (ref 0–35)
AST: 17 U/L (ref 0–37)
Bilirubin, Direct: 0.1 mg/dL (ref 0.0–0.3)
TOTAL PROTEIN: 6.9 g/dL (ref 6.0–8.3)
Total Bilirubin: 0.8 mg/dL (ref 0.3–1.2)

## 2014-01-17 MED ORDER — OMEPRAZOLE 20 MG PO CPDR: 20.0000 mg | DELAYED_RELEASE_CAPSULE | Freq: Every day | ORAL | Status: DC

## 2014-01-17 MED ORDER — HYDROCHLOROTHIAZIDE 12.5 MG PO CAPS: ORAL_CAPSULE | ORAL | Status: DC

## 2014-01-17 MED ORDER — DILTIAZEM HCL 90 MG PO TABS: 90.0000 mg | ORAL_TABLET | Freq: Two times a day (BID) | ORAL | Status: DC

## 2014-01-17 MED ORDER — METFORMIN HCL 500 MG PO TABS: 500.0000 mg | ORAL_TABLET | Freq: Two times a day (BID) | ORAL | Status: DC

## 2014-01-17 MED ORDER — AZELASTINE HCL 0.1 % NA SOLN: 2.0000 | Freq: Two times a day (BID) | NASAL | Status: DC

## 2014-01-17 NOTE — Assessment & Plan Note (Signed)
Chronic problem.  Not checking sugars.  UTD on eye exam.  Continued neuropathy of feet.  Denies symptomatic lows.  Not currently on ACE/ARB due to previous questionable reaction but no evidence of microalbuminuria.  Check labs.  Adjust meds prn

## 2014-01-17 NOTE — Progress Notes (Signed)
   Subjective:    Patient ID: Melanie [name], female    DOB: 1941/04/15, 73 y.o.   MRN: [id number]  HPI DM- chronic problem, on Metformin.  Stopped ACE due to possible allergic reaction (facial rash).  No checking CBGs.  Denies symptomatic lows.  No CP, SOB, HAs, visual changes.  Continuous numbness of feet.  No N/V/D.  Hyperlipidemia- chronic problem.  Pt is refusing statin, 'i just don't want to'.  HTN- chronic problem, on Diltiazem, HCTZ.  No longer on lisinopril.  Inclusion cyst- L side of neck.  Not painful.  'it's ugly'   Review of Systems For ROS see HPI     Objective:   Physical Exam  Vitals reviewed. Constitutional: She is oriented to person, place, and time. She appears well-developed and well-nourished. No distress.  HENT:  Head: Normocephalic and atraumatic.  Eyes: Conjunctivae and EOM are normal. Pupils are equal, round, and reactive to light.  Neck: Normal range of motion. Neck supple. No thyromegaly present.  Cardiovascular: Normal rate, regular rhythm, normal heart sounds and intact distal pulses.   No murmur heard. Pulmonary/Chest: Effort normal and breath sounds normal. No respiratory distress.  Abdominal: Soft. She exhibits no distension. There is no tenderness.  Musculoskeletal: She exhibits no edema.  Lymphadenopathy:    She has no cervical adenopathy.  Neurological: She is alert and oriented to person, place, and time.  Skin: Skin is warm and dry.  1 cm inclusion on L side of neck- black head expressed  Psychiatric: She has a normal mood and affect. Her behavior is normal.          Assessment & Plan:

## 2014-01-17 NOTE — Telephone Encounter (Signed)
Med filled.  

## 2014-01-17 NOTE — Patient Instructions (Signed)
Follow up in 3-4 months to recheck diabetes We'll notify you of your lab results and make any changes if needed Call with any questions or concerns Keep up the good work!!

## 2014-01-17 NOTE — Assessment & Plan Note (Signed)
Chronic problem.  Currently asymptomatic.  Check labs.  No anticipated med changes. 

## 2014-01-17 NOTE — Assessment & Plan Note (Signed)
Chronic problem.  Pt is refusing statins- 'they're bad for you'.  Not exercising.  Attempting to follow a healthy diet.  Reviewed increased risk of CVA and MI in setting of elevated lipids- pt aware.  Check labs.  Will follow.

## 2014-01-17 NOTE — Assessment & Plan Note (Signed)
New.  Black head expressed w/o difficulty.  Pt appreciative.

## 2014-01-18 ENCOUNTER — Encounter: Payer: Self-pay | Admitting: General Practice

## 2014-01-20 ENCOUNTER — Telehealth: Payer: Self-pay

## 2014-01-20 NOTE — Telephone Encounter (Signed)
Relevant patient education assigned to patient using Emmi. ° °

## 2014-01-26 ENCOUNTER — Telehealth: Payer: Self-pay | Admitting: Family Medicine

## 2014-01-26 NOTE — Telephone Encounter (Signed)
Relevant patient education assigned to patient using Emmi. ° °

## 2014-05-04 ENCOUNTER — Ambulatory Visit: Payer: Medicare HMO | Admitting: Family Medicine

## 2014-05-08 ENCOUNTER — Ambulatory Visit (INDEPENDENT_AMBULATORY_CARE_PROVIDER_SITE_OTHER): Payer: Medicare HMO | Admitting: Family Medicine

## 2014-05-08 ENCOUNTER — Encounter: Payer: Self-pay | Admitting: General Practice

## 2014-05-08 ENCOUNTER — Encounter: Payer: Self-pay | Admitting: Family Medicine

## 2014-05-08 VITALS — BP 134/82 | HR 73 | Temp 97.6°F | Resp 17 | Wt 193.1 lb

## 2014-05-08 DIAGNOSIS — Z1231 Encounter for screening mammogram for malignant neoplasm of breast: Secondary | ICD-10-CM

## 2014-05-08 DIAGNOSIS — R350 Frequency of micturition: Secondary | ICD-10-CM

## 2014-05-08 DIAGNOSIS — E1149 Type 2 diabetes mellitus with other diabetic neurological complication: Secondary | ICD-10-CM

## 2014-05-08 DIAGNOSIS — I10 Essential (primary) hypertension: Secondary | ICD-10-CM

## 2014-05-08 DIAGNOSIS — R82998 Other abnormal findings in urine: Secondary | ICD-10-CM

## 2014-05-08 DIAGNOSIS — R8290 Unspecified abnormal findings in urine: Secondary | ICD-10-CM

## 2014-05-08 LAB — BASIC METABOLIC PANEL
BUN: 17 mg/dL (ref 6–23)
CHLORIDE: 105 meq/L (ref 96–112)
CO2: 29 mEq/L (ref 19–32)
Calcium: 9 mg/dL (ref 8.4–10.5)
Creatinine, Ser: 0.9 mg/dL (ref 0.4–1.2)
GFR: 65.27 mL/min (ref >60.00)
GLUCOSE: 129 mg/dL — AB (ref 70–99)
POTASSIUM: 4.2 meq/L (ref 3.5–5.1)
Sodium: 140 mEq/L (ref 135–145)

## 2014-05-08 LAB — POCT URINALYSIS DIPSTICK
BILIRUBIN UA: NEGATIVE
GLUCOSE UA: NEGATIVE
KETONES UA: NEGATIVE
Nitrite, UA: NEGATIVE
Protein, UA: NEGATIVE
UROBILINOGEN UA: 0.2
pH, UA: 5

## 2014-05-08 LAB — HEMOGLOBIN A1C: Hgb A1c MFr Bld: 7.1 % — ABNORMAL HIGH (ref 4.6–6.5)

## 2014-05-08 MED ORDER — LISINOPRIL-HYDROCHLOROTHIAZIDE 10-12.5 MG PO TABS: 1.0000 | ORAL_TABLET | Freq: Every day | ORAL | Status: DC

## 2014-05-08 NOTE — Assessment & Plan Note (Signed)
Chronic problem.  Hx of good control.  Pt not checking CBGs at home.  UTD on eye exam.  Restart Lisinopril for renal protection.  Check labs.  Adjust meds prn

## 2014-05-08 NOTE — Patient Instructions (Signed)
Schedule your complete physical for after 9/16 [facility] notify you of your lab results and make any changes if needed START the Lisinopril HCTZ combo pill (you used to be on this but Dr Linna Darner stopped it when you had poison ivy) We'll call you with your mammogram Call with any questions or concerns [place]!!!

## 2014-05-08 NOTE — Progress Notes (Signed)
Pre visit review using our clinic review tool, if applicable. No additional management support is needed unless otherwise documented below in the visit note. 

## 2014-05-08 NOTE — Progress Notes (Signed)
   Subjective:    Patient ID: Melanie [name], female    DOB: 09-May-1941, 73 y.o.   MRN: [id number]  HPI DM- chronic problem, on metformin.  UTD on eye exam.  Denies symptomatic lows.  No CP, SOB, HAs, visual changes, N/V.  Neuropathy is unchanged 'for the last 5 yrs'.    Urinary frequency- ongoing issue for pt.  Denies dysuria, hematuria.  + urgency  mammo- pt skipped last year but would like to go.  Asking for referral  Review of Systems For ROS see HPI     Objective:   Physical Exam  Vitals reviewed. Constitutional: She is oriented to person, place, and time. She appears well-developed and well-nourished. No distress.  HENT:  Head: Normocephalic and atraumatic.  Eyes: Conjunctivae and EOM are normal. Pupils are equal, round, and reactive to light.  Neck: Normal range of motion. Neck supple. No thyromegaly present.  Cardiovascular: Normal rate, regular rhythm, normal heart sounds and intact distal pulses.   No murmur heard. Pulmonary/Chest: Effort normal and breath sounds normal. No respiratory distress.  Abdominal: Soft. She exhibits no distension. There is no tenderness.  Musculoskeletal: She exhibits no edema.  Lymphadenopathy:    She has no cervical adenopathy.  Neurological: She is alert and oriented to person, place, and time.  Skin: Skin is warm and dry.  Psychiatric: She has a normal mood and affect. Her behavior is normal.          Assessment & Plan:

## 2014-05-08 NOTE — Assessment & Plan Note (Signed)
Restart Lisinopril HCTZ for better BP control and renal protection in the setting of DM.

## 2014-05-08 NOTE — Addendum Note (Signed)
Addended by: Harl Bowie on: 05/08/2014 04:37 PM   Modules accepted: Orders

## 2014-05-08 NOTE — Assessment & Plan Note (Signed)
New.  Pt denies other sxs of UTI but has hx of recurrent infxn.  Get UA and cx if appropriate.  Will treat prn.

## 2014-05-11 ENCOUNTER — Other Ambulatory Visit: Payer: Self-pay | Admitting: General Practice

## 2014-05-12 ENCOUNTER — Other Ambulatory Visit: Payer: Self-pay | Admitting: General Practice

## 2014-05-12 LAB — URINE CULTURE

## 2014-05-12 MED ORDER — CEPHALEXIN 500 MG PO CAPS: 500.0000 mg | ORAL_CAPSULE | Freq: Two times a day (BID) | ORAL | Status: DC

## 2014-05-25 ENCOUNTER — Ambulatory Visit
Admission: RE | Admit: 2014-05-25 | Discharge: 2014-05-25 | Disposition: A | Discharge status: Home / Self Care | Payer: Commercial Managed Care - HMO | Source: Ambulatory Visit | Attending: Family Medicine | Admitting: Family Medicine

## 2014-05-25 DIAGNOSIS — Z1231 Encounter for screening mammogram for malignant neoplasm of breast: Secondary | ICD-10-CM

## 2014-05-26 ENCOUNTER — Ambulatory Visit: Payer: Medicare HMO | Admitting: Family Medicine

## 2014-05-26 ENCOUNTER — Ambulatory Visit (INDEPENDENT_AMBULATORY_CARE_PROVIDER_SITE_OTHER): Payer: Commercial Managed Care - HMO | Admitting: Family Medicine

## 2014-05-26 ENCOUNTER — Encounter: Payer: Self-pay | Admitting: Family Medicine

## 2014-05-26 VITALS — BP 124/86 | HR 72 | Temp 98.4°F | Resp 16 | Wt 192.5 lb

## 2014-05-26 DIAGNOSIS — R82998 Other abnormal findings in urine: Secondary | ICD-10-CM

## 2014-05-26 DIAGNOSIS — R350 Frequency of micturition: Secondary | ICD-10-CM

## 2014-05-26 LAB — POCT URINALYSIS DIPSTICK
Bilirubin, UA: NEGATIVE
Blood, UA: NEGATIVE
Glucose, UA: NEGATIVE
Ketones, UA: NEGATIVE
NITRITE UA: NEGATIVE
PROTEIN UA: NEGATIVE
SPEC GRAV UA: 1.01
Urobilinogen, UA: 0.2
pH, UA: 6

## 2014-05-26 MED ORDER — CIPROFLOXACIN HCL 500 MG PO TABS: 500.0000 mg | ORAL_TABLET | Freq: Two times a day (BID) | ORAL | Status: DC

## 2014-05-26 NOTE — Progress Notes (Signed)
Pre visit review using our clinic review tool, if applicable. No additional management support is needed unless otherwise documented below in the visit note. 

## 2014-05-26 NOTE — Patient Instructions (Signed)
Follow up as scheduled Start the Cipro twice daily Drink plenty of fluids Call with any questions or concerns Have a great weekend!!!

## 2014-05-26 NOTE — Progress Notes (Signed)
   Subjective:    Patient ID: Melanie [name], female    DOB: Aug 27, 1941, 73 y.o.   MRN: [id number]  HPI UTI- pt reports that during treatment of her UTI w/ keflex, she was extremely fatigued, sore throat.  No N/V.  + diarrhea.  Took the keflex x5 days and after course of abx, sxs 'immediately' got better.  Still having urgency and frequency, but unable to empty bladder completely.  + hesitancy.  No blood.  No fevers.  No suprapubic or CVA tenderness.  sxs returned ~2 days ago.     Review of Systems For ROS see HPI     Objective:   Physical Exam  Vitals reviewed. Constitutional: She appears well-developed and well-nourished. No distress.  Abdominal: Soft. She exhibits no distension. There is no tenderness (no suprapubic or CVA tenderness).          Assessment & Plan:

## 2014-05-26 NOTE — Addendum Note (Signed)
Addended by: Modena Morrow D on: 05/26/2014 05:15 PM   Modules accepted: Orders

## 2014-05-26 NOTE — Assessment & Plan Note (Signed)
New.  Pt's UA suspicious for UTI.  Start abx- NOT keflex due to intolerance.  Reviewed supportive care and red flags that should prompt return.  Pt expressed understanding and is in agreement w/ plan.

## 2014-05-28 LAB — URINE CULTURE: Colony Count: 9000

## 2014-05-31 ENCOUNTER — Telehealth: Payer: Self-pay

## 2014-05-31 DIAGNOSIS — R350 Frequency of micturition: Secondary | ICD-10-CM

## 2014-05-31 NOTE — Telephone Encounter (Signed)
Pt did not have a urinary infection when we checked.  Can refer to urology for ongoing symptoms.  If she continues to feel poorly, will need OV

## 2014-05-31 NOTE — Telephone Encounter (Signed)
Pt was last seen on 05/26/14 for fatigue, urinary frequency and leukocytes in urine.  She was started on Cipro 500 mg.  Took her last dose on yesterday (05/30/14).  She called this morning because she continues to experience urinary frequency and is unable to completely empty her bladder.  She also reported feeling "terrible" yesterday--"sleeping on and off" all day, but states that she generally felt good Saturday (05/27/14), Sunday (05/28/14), and today.  Pt wants to know if she needs to have her urine tested since she continues to experience urinary frequency and is unable to completely empty her bladder.    Please advise.

## 2014-05-31 NOTE — Telephone Encounter (Signed)
Called pt and lmovm to inform that referral was placed, also that if she is still feeling badly she would need to be seen.

## 2014-08-16 ENCOUNTER — Other Ambulatory Visit: Payer: Self-pay | Admitting: Family Medicine

## 2014-08-16 NOTE — Telephone Encounter (Signed)
Med filled.  

## 2014-09-13 ENCOUNTER — Encounter: Payer: Self-pay | Admitting: Family Medicine

## 2014-09-13 ENCOUNTER — Ambulatory Visit (INDEPENDENT_AMBULATORY_CARE_PROVIDER_SITE_OTHER): Payer: Commercial Managed Care - HMO | Admitting: Family Medicine

## 2014-09-13 VITALS — BP 130/82 | HR 65 | Temp 98.0°F | Resp 16 | Ht 65.0 in | Wt 190.2 lb

## 2014-09-13 DIAGNOSIS — E1149 Type 2 diabetes mellitus with other diabetic neurological complication: Secondary | ICD-10-CM

## 2014-09-13 DIAGNOSIS — I10 Essential (primary) hypertension: Secondary | ICD-10-CM

## 2014-09-13 DIAGNOSIS — E785 Hyperlipidemia, unspecified: Secondary | ICD-10-CM

## 2014-09-13 DIAGNOSIS — Z0000 Encounter for general adult medical examination without abnormal findings: Secondary | ICD-10-CM

## 2014-09-13 DIAGNOSIS — Z23 Encounter for immunization: Secondary | ICD-10-CM

## 2014-09-13 LAB — CBC WITH DIFFERENTIAL/PLATELET
Basophils Absolute: 0.1 10*3/uL (ref 0.0–0.1)
Basophils Relative: 0.8 % (ref 0.0–3.0)
EOS PCT: 4 % (ref 0.0–5.0)
Eosinophils Absolute: 0.3 10*3/uL (ref 0.0–0.7)
HEMATOCRIT: 42.4 % (ref 36.0–46.0)
Hemoglobin: 14.2 g/dL (ref 12.0–15.0)
LYMPHS ABS: 1.6 10*3/uL (ref 0.7–4.0)
Lymphocytes Relative: 25.1 % (ref 12.0–46.0)
MCHC: 33.4 g/dL (ref 30.0–36.0)
MCV: 91.6 fl (ref 78.0–100.0)
MONO ABS: 0.5 10*3/uL (ref 0.1–1.0)
Monocytes Relative: 7.7 % (ref 3.0–12.0)
NEUTROS ABS: 4 10*3/uL (ref 1.4–7.7)
Neutrophils Relative %: 62.4 % (ref 43.0–77.0)
PLATELETS: 224 10*3/uL (ref 150.0–400.0)
RBC: 4.63 Mil/uL (ref 3.87–5.11)
RDW: 12.9 % (ref 11.5–15.5)
WBC: 6.4 10*3/uL (ref 4.0–10.5)

## 2014-09-13 LAB — TSH: TSH: 1.15 u[IU]/mL (ref 0.35–4.50)

## 2014-09-13 LAB — LIPID PANEL
CHOL/HDL RATIO: 7
Cholesterol: 234 mg/dL — ABNORMAL HIGH (ref 0–200)
HDL: 34.8 mg/dL — ABNORMAL LOW (ref >39.00)
NONHDL: 199.2
Triglycerides: 236 mg/dL — ABNORMAL HIGH (ref 0.0–149.0)
VLDL: 47.2 mg/dL — ABNORMAL HIGH (ref 0.0–40.0)

## 2014-09-13 LAB — BASIC METABOLIC PANEL
BUN: 17 mg/dL (ref 6–23)
CHLORIDE: 100 meq/L (ref 96–112)
CO2: 29 mEq/L (ref 19–32)
Calcium: 9.1 mg/dL (ref 8.4–10.5)
Creatinine, Ser: 0.9 mg/dL (ref 0.4–1.2)
GFR: 65.2 mL/min (ref >60.00)
Glucose, Bld: 140 mg/dL — ABNORMAL HIGH (ref 70–99)
POTASSIUM: 3.6 meq/L (ref 3.5–5.1)
Sodium: 138 mEq/L (ref 135–145)

## 2014-09-13 LAB — HEMOGLOBIN A1C: Hgb A1c MFr Bld: 7 % — ABNORMAL HIGH (ref 4.6–6.5)

## 2014-09-13 LAB — HEPATIC FUNCTION PANEL
ALT: 17 U/L (ref 0–35)
AST: 17 U/L (ref 0–37)
Albumin: 4.3 g/dL (ref 3.5–5.2)
Alkaline Phosphatase: 60 U/L (ref 39–117)
BILIRUBIN DIRECT: 0 mg/dL (ref 0.0–0.3)
BILIRUBIN TOTAL: 0.6 mg/dL (ref 0.2–1.2)
Total Protein: 7.7 g/dL (ref 6.0–8.3)

## 2014-09-13 LAB — LDL CHOLESTEROL, DIRECT: Direct LDL: 179.2 mg/dL

## 2014-09-13 NOTE — Progress Notes (Signed)
Pre visit review using our clinic review tool, if applicable. No additional management support is needed unless otherwise documented below in the visit note. 

## 2014-09-13 NOTE — Progress Notes (Signed)
   Subjective:    Patient ID: Melanie [name], female    DOB: Oct 06, 1941, 73 y.o.   MRN: [id number]  HPI Here today for CPE.  Risk Factors: DM- chronic problem, on Metformin. UTD on eye exam.  No checking sugars at home.  Denies symptomatic lows.  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  No CP, SOB, HAs, visual changes, N/V.  Seeing podiatry regularly HTN- chronic problem, on Lisinopril- HCTZ, Dilt. Hyperlipidemia- chronic problem, not currently on meds b/c pt has refused statin previously.  On ASA 81mg  Physical Activity: gardening regularly, [facility] Risk: low risk Depression: no current sxs Hearing: normal to conversational tones and whispered voice at 6 ft ADL's: independent Cognitive: normal linear thought process, memory and attention intact Home Safety: safe at home, lives w/ son Height, Weight, BMI, Visual Acuity: see vitals, vision corrected to 20/20 w/ glasses Counseling: UTD on colonoscopy, eye exam, mammo.  Due for DEXA- pt prefers to hold off at this time Labs Ordered: See A&P Care Plan: See A&P    Review of Systems Patient reports no vision/ hearing changes, adenopathy,fever, weight change,  persistant/recurrent hoarseness , swallowing issues, chest pain, palpitations, edema, persistant/recurrent cough, hemoptysis, dyspnea (rest/exertional/paroxysmal nocturnal), gastrointestinal bleeding (melena, rectal bleeding), abdominal pain, significant heartburn, bowel changes, Gyn symptoms (abnormal  bleeding, pain),  syncope, focal weakness, memory loss, skin/hair/nail changes, abnormal bruising or bleeding, anxiety, or depression.   + urinary issues- following w/ Dr Louis Meckel + Neuropathy    Objective:   Physical Exam General Appearance:    Alert, cooperative, no distress, appears stated age  Head:    Normocephalic, without obvious abnormality, atraumatic  Eyes:    PERRL, conjunctiva/corneas clear, EOM's intact, fundi    benign, both eyes  Ears:     Normal TM's and external ear canals, both ears  Nose:   Nares normal, septum midline, mucosa normal, no drainage    or sinus tenderness  Throat:   Lips, mucosa, and tongue normal; teeth and gums normal  Neck:   Supple, symmetrical, trachea midline, no adenopathy;    Thyroid: no enlargement/tenderness/nodules  Back:     Symmetric, no curvature, ROM normal, no CVA tenderness  Lungs:     Clear to auscultation bilaterally, respirations unlabored  Chest Wall:    No tenderness or deformity   Heart:    Regular rate and rhythm, S1 and S2 normal, no murmur, rub   or gallop  Breast Exam:    Deferred to mammo  Abdomen:     Soft, non-tender, bowel sounds active all four quadrants,    no masses, no organomegaly  Genitalia:    Deferred  Rectal:    Extremities:   Extremities normal, atraumatic, no cyanosis or edema  Pulses:   2+ and symmetric all extremities  Skin:   Skin color, texture, turgor normal, no rashes or lesions  Lymph nodes:   Cervical, supraclavicular, and axillary nodes normal  Neurologic:   CNII-XII intact, normal strength, sensation and reflexes    throughout          Assessment & Plan:

## 2014-09-13 NOTE — Patient Instructions (Signed)
Follow up in 3-4 months to recheck diabetes We'll notify you of your lab results and make any changes if needed Try and get regular exercise and make healthy food choices Schedule your eye exam for December when it is due Call with any questions or concerns Happy Fall!!!

## 2014-09-14 DIAGNOSIS — Z23 Encounter for immunization: Secondary | ICD-10-CM

## 2014-09-17 NOTE — Assessment & Plan Note (Signed)
Pt's PE WNL- unchanged from previous.  UTD on health maintenance w/ exception of DEXA- pt prefers to hold off at this time.  Written screening schedule updated and given to pt.  Check labs.  Anticipatory guidance provided.

## 2014-09-17 NOTE — Assessment & Plan Note (Signed)
Chronic problem.  UTD on eye exam and podiatry.  On ACE for renal protection.  Check labs.  Adjust meds prn

## 2014-09-17 NOTE — Assessment & Plan Note (Signed)
Chronic problem.  Asymptomatic.  Check labs.  No anticipated med changes. 

## 2014-09-17 NOTE — Assessment & Plan Note (Signed)
Chronic problem.  Pt has refused statin in the past.  Will check labs and again encourage pt to start meds.

## 2015-01-17 ENCOUNTER — Ambulatory Visit: Payer: Commercial Managed Care - HMO | Admitting: Family Medicine

## 2015-02-01 ENCOUNTER — Ambulatory Visit: Payer: Commercial Managed Care - HMO | Admitting: Family Medicine

## 2015-02-22 ENCOUNTER — Encounter: Payer: Self-pay | Admitting: Family Medicine

## 2015-02-22 ENCOUNTER — Ambulatory Visit (INDEPENDENT_AMBULATORY_CARE_PROVIDER_SITE_OTHER): Payer: Commercial Managed Care - HMO | Admitting: Family Medicine

## 2015-02-22 VITALS — BP 126/86 | HR 89 | Temp 97.9°F | Resp 16 | Wt 188.4 lb

## 2015-02-22 DIAGNOSIS — E1149 Type 2 diabetes mellitus with other diabetic neurological complication: Secondary | ICD-10-CM

## 2015-02-22 DIAGNOSIS — R350 Frequency of micturition: Secondary | ICD-10-CM

## 2015-02-22 DIAGNOSIS — E785 Hyperlipidemia, unspecified: Secondary | ICD-10-CM

## 2015-02-22 NOTE — Patient Instructions (Signed)
Follow up in 3-4 months to recheck diabetes We'll notify you of your lab and urine results and make any changes if needed Schedule your eye exam at your convenience Drink plenty of fluids REST! Call with any questions or concerns Hang in there!!!

## 2015-02-22 NOTE — Progress Notes (Signed)
Pre visit review using our clinic review tool, if applicable. No additional management support is needed unless otherwise documented below in the visit note. 

## 2015-02-22 NOTE — Assessment & Plan Note (Signed)
Chronic problem.  Pt has not been compliant w/ f/u.  Overdue for eye exam- encouraged her to schedule.  On ACE for renal protection.  Asymptomatic.  Check labs.  Adjust meds prn

## 2015-02-22 NOTE — Progress Notes (Signed)
   Subjective:    Patient ID: Melanie [name], female    DOB: 28-Sep-1941, 74 y.o.   MRN: [id number]  HPI ? UTI- pt has hx of sxs.  Pt previously saw Urology who put her on prophylactic abx x3 months.  Increased frequency, urinary hesitancy.  No dysuria.  'i feel lousy'.  No fevers.  No N/V.  DM- chronic problem, on Metformin.  On ACE for renal protection.  Due for eye exam.denies symptomatic lows.  No CP, SOB, HAs, visual changes, edema, no worsening of foot numbness.  Hyperlipidemia- chronic problem, pt has refused statin in the past.     Review of Systems For ROS see HPI     Objective:   Physical Exam  Constitutional: She is oriented to person, place, and time. She appears well-developed and well-nourished. No distress.  HENT:  Head: Normocephalic and atraumatic.  Eyes: Conjunctivae and EOM are normal. Pupils are equal, round, and reactive to light.  Neck: Normal range of motion. Neck supple. No thyromegaly present.  Cardiovascular: Normal rate, regular rhythm and intact distal pulses.   Murmur (II/VI SEM heard best at RUSB) heard. Pulmonary/Chest: Effort normal and breath sounds normal. No respiratory distress.  Abdominal: Soft. She exhibits no distension. There is no tenderness (no CVA or suprapubic tenderness).  Musculoskeletal: She exhibits no edema.  Lymphadenopathy:    She has no cervical adenopathy.  Neurological: She is alert and oriented to person, place, and time.  Skin: Skin is warm and dry.  Psychiatric: She has a normal mood and affect. Her behavior is normal.  Vitals reviewed.         Assessment & Plan:

## 2015-02-22 NOTE — Assessment & Plan Note (Signed)
Chronic problem.  Pt has been refusing statins.  Due for repeat labs.  Again discussed risk of not starting cholesterol med and increased risk of MI and CVA.  Will again discuss starting meds once labs available.

## 2015-02-22 NOTE — Assessment & Plan Note (Signed)
Pt has hx of similar.  Was unable to urinate today in office.  Waiting on UA to start meds if needed.

## 2015-02-23 ENCOUNTER — Telehealth: Payer: Self-pay | Admitting: Family Medicine

## 2015-02-23 DIAGNOSIS — R350 Frequency of micturition: Secondary | ICD-10-CM | POA: Diagnosis not present

## 2015-02-23 LAB — HEMOGLOBIN A1C: Hgb A1c MFr Bld: 7.4 % — ABNORMAL HIGH (ref 4.6–6.5)

## 2015-02-23 LAB — POCT URINALYSIS DIPSTICK
Bilirubin, UA: NEGATIVE
Blood, UA: NEGATIVE
Leukocytes, UA: NEGATIVE
NITRITE UA: NEGATIVE
Spec Grav, UA: >=1.03
Urobilinogen, UA: 0.2
pH, UA: 5

## 2015-02-23 LAB — LIPID PANEL
CHOL/HDL RATIO: 5
Cholesterol: 207 mg/dL — ABNORMAL HIGH (ref 0–200)
HDL: 41.6 mg/dL (ref >39.00)
NonHDL: 165.4
Triglycerides: 334 mg/dL — ABNORMAL HIGH (ref 0.0–149.0)
VLDL: 66.8 mg/dL — AB (ref 0.0–40.0)

## 2015-02-23 LAB — BASIC METABOLIC PANEL
BUN: 17 mg/dL (ref 6–23)
CALCIUM: 9.5 mg/dL (ref 8.4–10.5)
CO2: 27 mEq/L (ref 19–32)
CREATININE: 1.05 mg/dL (ref 0.40–1.20)
Chloride: 101 mEq/L (ref 96–112)
GFR: 54.51 mL/min — AB (ref >60.00)
Glucose, Bld: 207 mg/dL — ABNORMAL HIGH (ref 70–99)
Potassium: 4 mEq/L (ref 3.5–5.1)
SODIUM: 136 meq/L (ref 135–145)

## 2015-02-23 LAB — LDL CHOLESTEROL, DIRECT: Direct LDL: 146 mg/dL

## 2015-02-23 LAB — HEPATIC FUNCTION PANEL
ALK PHOS: 58 U/L (ref 39–117)
ALT: 15 U/L (ref 0–35)
AST: 15 U/L (ref 0–37)
Albumin: 4.4 g/dL (ref 3.5–5.2)
BILIRUBIN TOTAL: 0.5 mg/dL (ref 0.2–1.2)
Bilirubin, Direct: 0.1 mg/dL (ref 0.0–0.3)
Total Protein: 7.1 g/dL (ref 6.0–8.3)

## 2015-02-23 LAB — TSH: TSH: 2.34 u[IU]/mL (ref 0.35–4.50)

## 2015-02-23 NOTE — Telephone Encounter (Signed)
Caller name: [name] Relation to pt: self Call back number: [phone] Pharmacy:  Reason for call:   Patient requesting urine results.

## 2015-02-23 NOTE — Telephone Encounter (Signed)
See result note.  

## 2015-02-23 NOTE — Telephone Encounter (Signed)
Noted, encounter closed and pt will be notified.

## 2015-02-23 NOTE — Addendum Note (Signed)
Addended by: Harl Bowie on: 02/23/2015 09:13 AM   Modules accepted: Orders

## 2015-02-24 LAB — URINE CULTURE: Colony Count: 75000

## 2015-03-01 ENCOUNTER — Other Ambulatory Visit: Payer: Self-pay | Admitting: Family Medicine

## 2015-03-01 NOTE — Telephone Encounter (Signed)
Med filled.  

## 2015-05-12 DIAGNOSIS — N390 Urinary tract infection, site not specified: Secondary | ICD-10-CM | POA: Diagnosis not present

## 2015-05-29 ENCOUNTER — Other Ambulatory Visit: Payer: Self-pay | Admitting: Family Medicine

## 2015-05-30 NOTE — Telephone Encounter (Signed)
Med filled.  

## 2015-06-05 ENCOUNTER — Ambulatory Visit (INDEPENDENT_AMBULATORY_CARE_PROVIDER_SITE_OTHER): Payer: Commercial Managed Care - HMO | Admitting: Family Medicine

## 2015-06-05 ENCOUNTER — Encounter: Payer: Self-pay | Admitting: Family Medicine

## 2015-06-05 VITALS — BP 130/78 | HR 79 | Temp 98.1°F | Resp 16 | Wt 188.1 lb

## 2015-06-05 DIAGNOSIS — N390 Urinary tract infection, site not specified: Secondary | ICD-10-CM | POA: Diagnosis not present

## 2015-06-05 DIAGNOSIS — R300 Dysuria: Secondary | ICD-10-CM

## 2015-06-05 DIAGNOSIS — R350 Frequency of micturition: Secondary | ICD-10-CM | POA: Diagnosis not present

## 2015-06-05 DIAGNOSIS — R82998 Other abnormal findings in urine: Secondary | ICD-10-CM

## 2015-06-05 LAB — POCT URINALYSIS DIPSTICK
Bilirubin, UA: NEGATIVE
Blood, UA: NEGATIVE
Glucose, UA: NEGATIVE
KETONES UA: NEGATIVE
Nitrite, UA: NEGATIVE
PROTEIN UA: NEGATIVE
SPEC GRAV UA: 1.02
UROBILINOGEN UA: NEGATIVE
pH, UA: 6

## 2015-06-05 MED ORDER — SULFAMETHOXAZOLE-TRIMETHOPRIM 800-160 MG PO TABS: 1.0000 | ORAL_TABLET | Freq: Two times a day (BID) | ORAL | Status: DC

## 2015-06-05 NOTE — Progress Notes (Signed)
   Subjective:    Patient ID: Melanie [name], female    DOB: 1941/08/30, 74 y.o.   MRN: [id number]  HPI UTI- pt had UTI on 5/21, evaluated by UC and tx'd w/ Macrobid.  Pt doesn't feel sxs resolved.  Pt is currently having dysuria, low back pain, increased frequency, urgency, hesitancy.  No fevers.   Review of Systems For ROS see HPI     Objective:   Physical Exam  Constitutional: She is oriented to person, place, and time. She appears well-developed and well-nourished. No distress.  HENT:  Head: Normocephalic and atraumatic.  Abdominal: Soft. She exhibits no distension. There is no tenderness (no suprapubic or CVA tenderness).  Neurological: She is alert and oriented to person, place, and time.  Skin: Skin is warm and dry.  Psychiatric: She has a normal mood and affect. Her behavior is normal. Thought content normal.  Vitals reviewed.         Assessment & Plan:

## 2015-06-05 NOTE — Assessment & Plan Note (Signed)
Recurrent issue for pt.  She was recently treated w/ Macrobid by UC but sxs never resolved.  Pt w/ leuks on UA today.  Start tx w/ Bactrim and await culture results

## 2015-06-05 NOTE — Patient Instructions (Signed)
Schedule a diabetes follow up at your convenience Start the Bactrim twice daily w/ food for the UTI Drink plenty of fluids We'll call you with your culture results and make any changes if needed Call with any questions or concerns Hang in there!!!

## 2015-06-05 NOTE — Progress Notes (Signed)
Pre visit review using our clinic review tool, if applicable. No additional management support is needed unless otherwise documented below in the visit note. 

## 2015-06-05 NOTE — Addendum Note (Signed)
Addended by: Modena Morrow D on: 06/05/2015 12:23 PM   Modules accepted: Orders

## 2015-06-08 LAB — URINE CULTURE: Colony Count: >=100000

## 2015-06-12 ENCOUNTER — Encounter: Payer: Self-pay | Admitting: General Practice

## 2015-06-13 ENCOUNTER — Telehealth: Payer: Self-pay | Admitting: Family Medicine

## 2015-06-13 NOTE — Telephone Encounter (Signed)
error:[id number] ° °

## 2015-06-14 ENCOUNTER — Encounter: Payer: Self-pay | Admitting: Family Medicine

## 2015-06-14 ENCOUNTER — Ambulatory Visit (INDEPENDENT_AMBULATORY_CARE_PROVIDER_SITE_OTHER): Payer: Commercial Managed Care - HMO | Admitting: Family Medicine

## 2015-06-14 VITALS — BP 124/80 | HR 80 | Temp 98.1°F | Resp 17 | Wt 185.4 lb

## 2015-06-14 DIAGNOSIS — R109 Unspecified abdominal pain: Secondary | ICD-10-CM | POA: Diagnosis not present

## 2015-06-14 DIAGNOSIS — R350 Frequency of micturition: Secondary | ICD-10-CM

## 2015-06-14 DIAGNOSIS — K219 Gastro-esophageal reflux disease without esophagitis: Secondary | ICD-10-CM

## 2015-06-14 LAB — POCT URINALYSIS DIPSTICK
BILIRUBIN UA: NEGATIVE
Glucose, UA: NEGATIVE
KETONES UA: NEGATIVE
Nitrite, UA: NEGATIVE
RBC UA: NEGATIVE
Spec Grav, UA: 1.02
Urobilinogen, UA: 1
pH, UA: 6.5

## 2015-06-14 MED ORDER — RANITIDINE HCL 300 MG PO TABS: 300.0000 mg | ORAL_TABLET | Freq: Every day | ORAL | Status: DC

## 2015-06-14 MED ORDER — CIPROFLOXACIN HCL 250 MG PO TABS: 250.0000 mg | ORAL_TABLET | Freq: Two times a day (BID) | ORAL | Status: DC

## 2015-06-14 NOTE — Progress Notes (Signed)
Pre visit review using our clinic review tool, if applicable. No additional management support is needed unless otherwise documented below in the visit note. 

## 2015-06-14 NOTE — Assessment & Plan Note (Signed)
Chronic problem.  Pt would like to stop PPI and switch to another medication after seeing a warning on TV about the risks of long term use.  Will switch to H2 blocker.  Reviewed supportive care and red flags that should prompt return.  Pt expressed understanding and is in agreement w/ plan.

## 2015-06-14 NOTE — Assessment & Plan Note (Signed)
Recurrent issue for pt.  UA again looks suspicious for infxn.  Start abx.  If culture confirms infxn, will need to again see urology.  Pt expressed understanding and is in agreement w/ plan.

## 2015-06-14 NOTE — Progress Notes (Signed)
   Subjective:    Patient ID: Melanie [name], female    DOB: 09-15-1941, 74 y.o.   MRN: [id number]  HPI UTI- pt was seen on 6/14 and tx'd w/ Bactrim.  Culture showed that this was appropriate tx.  Increased urinary frequency starting yesterday- completed abx on Tuesday after 7 days.  Today has mild L flank pain.  No fevers.  No N/V.  Pt is not drinking sodas or other beverages, only water.  No new sexual partner.  GERD- pt saw a commercial on TV about the dangers of PPI.  Wants to switch to something else.   Review of Systems For ROS see HPI     Objective:   Physical Exam  Constitutional: She appears well-developed and well-nourished. No distress.  Abdominal: Soft. She exhibits no distension. There is no tenderness (no suprapubic or CVA tenderness).  Musculoskeletal: She exhibits tenderness (mild L LBP). She exhibits no edema.  Skin: Skin is warm and dry.  Psychiatric: She has a normal mood and affect. Her behavior is normal. Thought content normal.  Vitals reviewed.         Assessment & Plan:

## 2015-06-14 NOTE — Patient Instructions (Signed)
Follow up as scheduled If you have another UTI (based on culture results) we'll send you to urology Start the Cipro twice daily Drink plenty of fluids Switch from the Omeprazole to the Ranitidine nightly Call with any questions or concerns Hang in there!!

## 2015-06-16 LAB — URINE CULTURE

## 2015-06-18 ENCOUNTER — Other Ambulatory Visit: Payer: Self-pay | Admitting: General Practice

## 2015-06-18 ENCOUNTER — Telehealth: Payer: Self-pay | Admitting: Family Medicine

## 2015-06-18 DIAGNOSIS — R350 Frequency of micturition: Secondary | ICD-10-CM

## 2015-06-18 DIAGNOSIS — Z0100 Encounter for examination of eyes and vision without abnormal findings: Secondary | ICD-10-CM

## 2015-06-18 DIAGNOSIS — Z1283 Encounter for screening for malignant neoplasm of skin: Secondary | ICD-10-CM

## 2015-06-18 NOTE — Telephone Encounter (Signed)
Pt notified of results. Referral placed.  

## 2015-06-18 NOTE — Telephone Encounter (Signed)
Caller name: Lisha Vitale Relationship to patient: self Can be reached: [phone] Pharmacy:  Reason for call: Pt returning call for labs. Updated home # as pt requested

## 2015-06-19 ENCOUNTER — Encounter: Payer: Self-pay | Admitting: Family Medicine

## 2015-06-22 ENCOUNTER — Encounter: Payer: Self-pay | Admitting: Family Medicine

## 2015-06-27 ENCOUNTER — Telehealth: Payer: Self-pay | Admitting: Family Medicine

## 2015-06-27 DIAGNOSIS — R350 Frequency of micturition: Secondary | ICD-10-CM

## 2015-06-27 NOTE — Telephone Encounter (Signed)
Referral placed today

## 2015-06-27 NOTE — Telephone Encounter (Signed)
Caller name: Alecia Relation to pt: Call back number: [phone] Pharmacy:  Reason for call:   Patient states that she will be seeing Dr. Kary Kos at [facility] urology on 7/19 and is needing a new referral for this. Has [employer]

## 2015-07-02 ENCOUNTER — Ambulatory Visit: Payer: Medicare HMO | Admitting: Family Medicine

## 2015-07-10 DIAGNOSIS — N3000 Acute cystitis without hematuria: Secondary | ICD-10-CM | POA: Diagnosis not present

## 2015-07-10 DIAGNOSIS — N3281 Overactive bladder: Secondary | ICD-10-CM | POA: Diagnosis not present

## 2015-07-16 ENCOUNTER — Encounter: Payer: Self-pay | Admitting: Family Medicine

## 2015-07-16 ENCOUNTER — Ambulatory Visit (INDEPENDENT_AMBULATORY_CARE_PROVIDER_SITE_OTHER): Payer: Commercial Managed Care - HMO | Admitting: Family Medicine

## 2015-07-16 VITALS — BP 122/78 | HR 72 | Temp 97.7°F | Resp 16 | Ht 65.0 in | Wt 185.5 lb

## 2015-07-16 DIAGNOSIS — E1149 Type 2 diabetes mellitus with other diabetic neurological complication: Secondary | ICD-10-CM

## 2015-07-16 DIAGNOSIS — E785 Hyperlipidemia, unspecified: Secondary | ICD-10-CM | POA: Diagnosis not present

## 2015-07-16 LAB — HEPATIC FUNCTION PANEL
ALT: 16 U/L (ref 0–35)
AST: 15 U/L (ref 0–37)
Albumin: 4.3 g/dL (ref 3.5–5.2)
Alkaline Phosphatase: 55 U/L (ref 39–117)
BILIRUBIN DIRECT: 0.1 mg/dL (ref 0.0–0.3)
BILIRUBIN TOTAL: 0.7 mg/dL (ref 0.2–1.2)
TOTAL PROTEIN: 6.8 g/dL (ref 6.0–8.3)

## 2015-07-16 LAB — BASIC METABOLIC PANEL
BUN: 17 mg/dL (ref 6–23)
CALCIUM: 9.4 mg/dL (ref 8.4–10.5)
CO2: 29 mEq/L (ref 19–32)
Chloride: 100 mEq/L (ref 96–112)
Creatinine, Ser: 0.88 mg/dL (ref 0.40–1.20)
GFR: 66.76 mL/min (ref >60.00)
Glucose, Bld: 165 mg/dL — ABNORMAL HIGH (ref 70–99)
Potassium: 3.7 mEq/L (ref 3.5–5.1)
Sodium: 138 mEq/L (ref 135–145)

## 2015-07-16 LAB — LDL CHOLESTEROL, DIRECT: Direct LDL: 159 mg/dL

## 2015-07-16 LAB — HEMOGLOBIN A1C: Hgb A1c MFr Bld: 6.9 % — ABNORMAL HIGH (ref 4.6–6.5)

## 2015-07-16 LAB — LIPID PANEL
Cholesterol: 209 mg/dL — ABNORMAL HIGH (ref 0–200)
HDL: 37.6 mg/dL — ABNORMAL LOW (ref >39.00)
NONHDL: 171.4
Total CHOL/HDL Ratio: 6
Triglycerides: 213 mg/dL — ABNORMAL HIGH (ref 0.0–149.0)
VLDL: 42.6 mg/dL — ABNORMAL HIGH (ref 0.0–40.0)

## 2015-07-16 NOTE — Progress Notes (Signed)
   Subjective:    Patient ID: Melanie [name], female    DOB: 03-Jan-1941, 74 y.o.   MRN: [id number]  HPI DM- chronic problem, on Metformin.  On lisinopril for renal protection.  Due for eye and foot exams.  Pt reports intermittent diarrhea but this is improving w/ probiotic.  Denies symptomatic lows.  No CP, SOB, HAs, visual changes, no neuropathy above baseline.  Has denied statins in the past   Review of Systems For ROS see HPI     Objective:   Physical Exam  Constitutional: She is oriented to person, place, and time. She appears well-developed and well-nourished. No distress.  HENT:  Head: Normocephalic and atraumatic.  Eyes: Conjunctivae and EOM are normal. Pupils are equal, round, and reactive to light.  Neck: Normal range of motion. Neck supple. No thyromegaly present.  Cardiovascular: Normal rate, regular rhythm, normal heart sounds and intact distal pulses.   No murmur heard. Pulmonary/Chest: Effort normal and breath sounds normal. No respiratory distress.  Abdominal: Soft. She exhibits no distension. There is no tenderness.  Musculoskeletal: She exhibits no edema.  Lymphadenopathy:    She has no cervical adenopathy.  Neurological: She is alert and oriented to person, place, and time.  Skin: Skin is warm and dry.  Psychiatric: She has a normal mood and affect. Her behavior is normal.  Vitals reviewed.         Assessment & Plan:

## 2015-07-16 NOTE — Assessment & Plan Note (Signed)
Chronic problem.  Pt is overdue on eye exam- again encouraged her to schedule.  On ACE for renal protection.  Foot exam done today- normal w/ exception of athlete's foot.  Currently asymptomatic.  Check labs.  Adjust meds prn.

## 2015-07-16 NOTE — Assessment & Plan Note (Signed)
Chronic problem.  Pt has refused statin in the past.  Repeat labs today and determine if meds are needed and try again to convince pt to use.

## 2015-07-16 NOTE — Patient Instructions (Signed)
Schedule your complete physical in 3-4 months We'll notify you of your lab results and make any changes if needed Please call and schedule your eye exam!!! Start OTC Clinical cytogeneticist (or the store brand equivalent) Call the urologist and let him know how much the cream costs and see if there's an alternative Call with any questions or concerns Enjoy the rest of your summer!!!

## 2015-07-16 NOTE — Progress Notes (Signed)
Pre visit review using our clinic review tool, if applicable. No additional management support is needed unless otherwise documented below in the visit note. 

## 2015-07-18 MED ORDER — SIMVASTATIN 40 MG PO TABS: 40.0000 mg | ORAL_TABLET | Freq: Every day | ORAL | Status: DC

## 2015-07-18 NOTE — Addendum Note (Signed)
Addended by: Wilfrid Lund on: 07/18/2015 09:23 AM   Modules accepted: Orders

## 2015-07-19 LAB — HM DIABETES EYE EXAM

## 2015-08-21 ENCOUNTER — Telehealth: Payer: Self-pay | Admitting: Family Medicine

## 2015-08-21 ENCOUNTER — Encounter: Payer: Self-pay | Admitting: Physician Assistant

## 2015-08-21 ENCOUNTER — Ambulatory Visit (INDEPENDENT_AMBULATORY_CARE_PROVIDER_SITE_OTHER): Payer: Commercial Managed Care - HMO | Admitting: Physician Assistant

## 2015-08-21 VITALS — BP 110/55 | HR 88 | Temp 98.2°F | Resp 16 | Ht 65.0 in | Wt 183.2 lb

## 2015-08-21 DIAGNOSIS — R1310 Dysphagia, unspecified: Secondary | ICD-10-CM | POA: Diagnosis not present

## 2015-08-21 DIAGNOSIS — J029 Acute pharyngitis, unspecified: Secondary | ICD-10-CM | POA: Diagnosis not present

## 2015-08-21 LAB — POCT RAPID STREP A (OFFICE): Rapid Strep A Screen: NEGATIVE

## 2015-08-21 MED ORDER — FLUTICASONE PROPIONATE 50 MCG/ACT NA SUSP: 2.0000 | Freq: Every day | NASAL | Status: DC

## 2015-08-21 NOTE — Addendum Note (Signed)
Addended by: Rockwell Germany on: 08/21/2015 05:32 PM   Modules accepted: Orders

## 2015-08-21 NOTE — Progress Notes (Signed)
Pre visit review using our clinic review tool, if applicable. No additional management support is needed unless otherwise documented below in the visit note/SLS  

## 2015-08-21 NOTE — Patient Instructions (Signed)
Please stay well hydrated and cut food into small pieces. 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 Take Tylenol for pain and do salt-water gargles twice daily. Use the Flonase daily as directed.

## 2015-08-21 NOTE — Telephone Encounter (Signed)
Pt has appt scheduled with GASTROENTEROLOGY it is 9/6 at 11:00

## 2015-08-21 NOTE — Progress Notes (Signed)
Patient presents to clinic today c/o difficulty with swallowing solid foods over the past couple of weeks. This is associated with mild sore throat, PND and ear pressure x 1 week. 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 Has not been taking acid reflux medication. Denies fever, chills, URI symptoms.  Past Medical History  Diagnosis Date  . Diabetes mellitus   . Hyperlipidemia   . Hypertension   . Pain in joint, ankle and foot 09/14/2013    Current Outpatient Prescriptions on File Prior to Visit  Medication Sig Dispense Refill  . Ascorbic Acid (VITAMIN C) 1000 MG tablet Take 1,000 mg by mouth 2 (two) times daily.    [name] aspirin (ASPIRIN EC) 81 MG EC tablet Take 81 mg by mouth daily. Swallow whole.    . Cholecalciferol (VITAMIN D PO) Take by mouth daily.    [name] CRANBERRY PO Take 2 tablets by mouth daily.    [name] diltiazem (CARDIZEM) 90 MG tablet TAKE 1 TABLET TWICE DAILY 180 tablet 1  . fish oil-omega-3 fatty acids 1000 MG capsule Take 2 g by mouth daily.    [name] lisinopril-hydrochlorothiazide (PRINZIDE,ZESTORETIC) 10-12.5 MG per tablet TAKE 1 TABLET  DAILY 90 tablet 1  . metFORMIN (GLUCOPHAGE) 500 MG tablet TAKE 1 TABLET TWICE DAILY WITH MEALS 180 tablet 1  . Probiotic Product (PROBIOTIC PO) Take 1 tablet by mouth daily.    . ranitidine (ZANTAC) 300 MG tablet Take 1 tablet (300 mg total) by mouth at bedtime. 30 tablet 3  . simvastatin (ZOCOR) 40 MG tablet Take 1 tablet (40 mg total) by mouth at bedtime. 30 tablet 3  . Vitamin Mixture (VITAMIN E COMPLETE PO) Take by mouth.    . solifenacin (VESICARE) 5 MG tablet Take 5 mg by mouth daily.     No current facility-administered medications on file prior to visit.    Allergies  Allergen Reactions  . Niacin And Related     rash  . Keflex [Cephalexin]     Fatigue, sore throat, body aches    Family History  Problem Relation Age of Onset  . Colon cancer Brother 40  . Colon cancer Maternal Aunt 62  . Stomach cancer Neg  Hx     Social History   Social History  . Marital Status: Married    Spouse Name: N/A  . Number of Children: N/A  . Years of Education: N/A   Social History Main Topics  . Smoking status: Never Smoker   . Smokeless tobacco: Never Used  . Alcohol Use: No  . Drug Use: No  . Sexual Activity: Not Asked   Other Topics Concern  . None   Social History Narrative   Review of Systems - See HPI.  All other ROS are negative.  BP 110/55 mmHg  Pulse 88  Temp(Src) 98.2 F (36.8 C) (Oral)  Resp 16  Ht 5\' 5"  (1.651 m)  Wt 183 lb 4 oz (83.122 kg)  BMI 30.49 kg/m2  SpO2 98%  Physical Exam  Constitutional: She is oriented to person, place, and time and well-developed, well-nourished, and in no distress.  HENT:  Head: Normocephalic and atraumatic.  Right Ear: Tympanic membrane and ear canal normal.  Left Ear: Tympanic membrane and ear canal normal.  Nose: Right sinus exhibits no maxillary sinus tenderness and no frontal sinus tenderness. Left sinus exhibits no maxillary sinus tenderness and no frontal sinus tenderness.  Mouth/Throat: Uvula is midline, oropharynx is clear and moist and mucous membranes  are normal.  Cardiovascular: Normal rate, regular rhythm, normal heart sounds and intact distal pulses.   Pulmonary/Chest: Effort normal and breath sounds normal. No respiratory distress. She has no wheezes. She has no rales. She exhibits no tenderness.  Neurological: She is alert and oriented to person, place, and time.  Skin: Skin is warm and dry. No rash noted.  Psychiatric: Affect normal.   Recent Results (from the past 2160 hour(s))  POCT urinalysis dipstick     Status: Abnormal   Collection Time: 06/05/15 11:34 AM  Result Value Ref Range   Color, UA yellow    Clarity, UA hazy    Glucose, UA negative    Bilirubin, UA negative    Ketones, UA negative    Spec Grav, UA 1.020    Blood, UA negative    pH, UA 6.0    Protein, UA negative    Urobilinogen, UA negative    Nitrite,  UA negative    Leukocytes, UA small (1+) (A) Negative  Urine culture     Status: None   Collection Time: 06/05/15 12:23 PM  Result Value Ref Range   Culture KLEBSIELLA PNEUMONIAE    Colony Count >=100,000 COLONIES/ML    Organism ID, Bacteria KLEBSIELLA PNEUMONIAE       Susceptibility   Klebsiella pneumoniae -  (no method available)    AMPICILLIN  Resistant     AMOX/CLAVULANIC <=2 Sensitive     AMPICILLIN/SULBACTAM 4 Sensitive     PIP/TAZO <=4 Sensitive     IMIPENEM <=0.25 Sensitive     CEFAZOLIN <=4 Sensitive     CEFTRIAXONE <=1 Sensitive     CEFTAZIDIME <=1 Sensitive     CEFEPIME <=1 Sensitive     GENTAMICIN <=1 Sensitive     TOBRAMYCIN <=1 Sensitive     CIPROFLOXACIN <=0.25 Sensitive     LEVOFLOXACIN <=0.12 Sensitive     NITROFURANTOIN 64 Intermediate     TRIMETH/SULFA <=20 Sensitive   POCT urinalysis dipstick     Status: Abnormal   Collection Time: 06/14/15  1:23 PM  Result Value Ref Range   Color, UA yellow    Clarity, UA clear    Glucose, UA neg    Bilirubin, UA neg    Ketones, UA neg    Spec Grav, UA 1.020    Blood, UA neg    pH, UA 6.5    Protein, UA trace    Urobilinogen, UA 1.0    Nitrite, UA neg    Leukocytes, UA moderate (2+) (A) Negative  Urine Culture     Status: None   Collection Time: 06/14/15  1:33 PM  Result Value Ref Range   Colony Count 15,000 COLONIES/ML    Organism ID, Bacteria Multiple bacterial morphotypes present, none    Organism ID, Bacteria predominant. Suggest appropriate recollection if     Organism ID, Bacteria clinically indicated.   Basic metabolic panel     Status: Abnormal   Collection Time: 07/16/15 11:14 AM  Result Value Ref Range   Sodium 138 135 - 145 mEq/L   Potassium 3.7 3.5 - 5.1 mEq/L   Chloride 100 96 - 112 mEq/L   CO2 29 19 - 32 mEq/L   Glucose, Bld 165 (H) 70 - 99 mg/dL   BUN 17 6 - 23 mg/dL   Creatinine, Ser 0.88 0.40 - 1.20 mg/dL   Calcium 9.4 8.4 - 10.5 mg/dL   GFR 66.76 >60.00 mL/min  Hemoglobin A1c      Status: Abnormal   Collection  Time: 07/16/15 11:14 AM  Result Value Ref Range   Hgb A1c MFr Bld 6.9 (H) 4.6 - 6.5 %    Comment: Glycemic Control Guidelines for People with Diabetes:Non Diabetic:  <6%Goal of Therapy: <7%Additional Action Suggested:  >8%   Lipid panel     Status: Abnormal   Collection Time: 07/16/15 11:14 AM  Result Value Ref Range   Cholesterol 209 (H) 0 - 200 mg/dL    Comment: ATP III Classification       Desirable:  < 200 mg/dL               Borderline High:  200 - 239 mg/dL          High:  > = 240 mg/dL   Triglycerides 213.0 (H) 0.0 - 149.0 mg/dL    Comment: Normal:  <150 mg/dLBorderline High:  150 - 199 mg/dL   HDL 37.60 (L) >39.00 mg/dL   VLDL 42.6 (H) 0.0 - 40.0 mg/dL   Total CHOL/HDL Ratio 6     Comment:                Men          Women1/2 Average Risk     3.4          3.3Average Risk          5.0          4.42X Average Risk          9.6          7.13X Average Risk          15.0          11.0                       NonHDL 171.40     Comment: NOTE:  Non-HDL goal should be 30 mg/dL higher than patient's LDL goal (i.e. LDL goal of < 70 mg/dL, would have non-HDL goal of < 100 mg/dL)  Hepatic function panel     Status: None   Collection Time: 07/16/15 11:14 AM  Result Value Ref Range   Total Bilirubin 0.7 0.2 - 1.2 mg/dL   Bilirubin, Direct 0.1 0.0 - 0.3 mg/dL   Alkaline Phosphatase 55 39 - 117 U/L   AST 15 0 - 37 U/L   ALT 16 0 - 35 U/L   Total Protein 6.8 6.0 - 8.3 g/dL   Albumin 4.3 3.5 - 5.2 g/dL  LDL cholesterol, direct     Status: None   Collection Time: 07/16/15 11:14 AM  Result Value Ref Range   Direct LDL 159.0 mg/dL    Comment: Optimal:  <100 mg/dLNear or Above Optimal:  100-129 mg/dLBorderline High:  130-159 mg/dLHigh:  160-189 mg/dLVery High:  >190 mg/dL    Assessment/Plan: Dysphagia Strep negative. Will send throat culture to lab. Suspect odynophagia secondary to PND. Rx Flonase nasal spray daily. Increase fluids. Salt water gargles and Tylenol.  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 Referral to GI placed. Patient to resume antacid medications.

## 2015-08-21 NOTE — Assessment & Plan Note (Signed)
Strep negative. Will send throat culture to lab. Suspect odynophagia secondary to PND. Rx Flonase nasal spray daily. Increase fluids. Salt water gargles and Tylenol. 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 Referral to GI placed. Patient to resume antacid medications.

## 2015-08-22 ENCOUNTER — Encounter: Payer: Self-pay | Admitting: Gastroenterology

## 2015-08-22 LAB — CULTURE, GROUP A STREP: Organism ID, Bacteria: NORMAL

## 2015-08-22 NOTE — Telephone Encounter (Signed)
[employer] # [id number]

## 2015-08-27 ENCOUNTER — Other Ambulatory Visit: Payer: Self-pay | Admitting: Family Medicine

## 2015-08-28 ENCOUNTER — Encounter: Payer: Self-pay | Admitting: Gastroenterology

## 2015-08-28 ENCOUNTER — Ambulatory Visit (INDEPENDENT_AMBULATORY_CARE_PROVIDER_SITE_OTHER): Payer: Commercial Managed Care - HMO | Admitting: Gastroenterology

## 2015-08-28 VITALS — BP 124/72 | HR 76 | Ht 63.5 in | Wt 183.5 lb

## 2015-08-28 DIAGNOSIS — R1310 Dysphagia, unspecified: Secondary | ICD-10-CM | POA: Diagnosis not present

## 2015-08-28 DIAGNOSIS — Z800 Family history of malignant neoplasm of digestive organs: Secondary | ICD-10-CM

## 2015-08-28 NOTE — Patient Instructions (Signed)

## 2015-08-28 NOTE — Assessment & Plan Note (Signed)
Dysphagia could be due to an esophageal stricture.  External compression from her previous hiatal hernia repair is also a possibility.  Recommendations #1 EGD with dilation as indicated

## 2015-08-28 NOTE — Telephone Encounter (Signed)
Medication filled to pharmacy as requested.   

## 2015-08-28 NOTE — Progress Notes (Signed)
_                                                                                                                History of Present Illness:  Melanie [name] is a 74 year old white female referred at the request of Dr. Birdie Riddle for evaluation of dysphagia.  This has been a gradual problem over the past several months.  She has dysphagia to both solids and especially with pills.  She is status post hiatal hernia repair in 2010.  She denies pyrosis.  Family history is pertinent for brother who had colon cancer in his early 69s.  She has a remote history of adenomatous polyps.  Last colonoscopy in 2013 was negative for adenomatous polyps.   Past Medical History  Diagnosis Date  . Diabetes mellitus   . Hyperlipidemia   . Hypertension   . Pain in joint, ankle and foot 09/14/2013  . Allergic rhinitis   . Arthritis   . GERD (gastroesophageal reflux disease)    Past Surgical History  Procedure Laterality Date  . Rotator cuff repair Right 2010  . Fracture surgery Left      leg  . Hiatal hernia surgery  2010  . Tonsillectomy     family history includes Breast cancer in her maternal aunt and paternal grandmother; Colon cancer (age of onset: 69) in her maternal aunt; Colon cancer (age of onset: 83) in her brother; Diabetes in her maternal aunt; Heart disease in her mother. There is no history of Stomach cancer. Current Outpatient Prescriptions  Medication Sig Dispense Refill  . Ascorbic Acid (VITAMIN C) 1000 MG tablet Take 1,000 mg by mouth 2 (two) times daily.    [name] aspirin (ASPIRIN EC) 81 MG EC tablet Take 81 mg by mouth daily. Swallow whole.    . Cholecalciferol (VITAMIN D PO) Take by mouth daily.    [name] CRANBERRY PO Take 2 tablets by mouth daily.    . Cyanocobalamin (VITAMIN B-12) 1000 MCG SUBL Place 1 each under the tongue daily.    [name] diltiazem (CARDIZEM) 90 MG tablet TAKE 1 TABLET TWICE DAILY 180 tablet 1  . fish oil-omega-3 fatty acids 1000 MG capsule Take 2 g by mouth daily.    .  fluticasone (FLONASE) 50 MCG/ACT nasal spray Place 2 sprays into both nostrils daily. 16 g 6  . lisinopril-hydrochlorothiazide (PRINZIDE,ZESTORETIC) 10-12.5 MG per tablet TAKE 1 TABLET  DAILY 90 tablet 1  . metFORMIN (GLUCOPHAGE) 500 MG tablet TAKE 1 TABLET TWICE DAILY WITH MEALS 180 tablet 1  . Probiotic Product (PROBIOTIC PO) Take 1 tablet by mouth daily.    . ranitidine (ZANTAC) 300 MG tablet Take 1 tablet (300 mg total) by mouth at bedtime. 30 tablet 3  . simvastatin (ZOCOR) 40 MG tablet Take 1 tablet (40 mg total) by mouth at bedtime. 30 tablet 3  . solifenacin (VESICARE) 5 MG tablet Take 5 mg by mouth daily.    . Vitamin Mixture (VITAMIN E COMPLETE PO) Take by mouth.     No  current facility-administered medications for this visit.   Allergies as of 08/28/2015 - Review Complete 08/28/2015  Allergen Reaction Noted  . Niacin and related  01/14/2012  . Keflex [cephalexin]  05/26/2014    reports that she has never smoked. She has never used smokeless tobacco. She reports that she does not drink alcohol or use illicit drugs.   Review of Systems: Pertinent positive and negative review of systems were noted in the above HPI section. All other review of systems were otherwise negative.  Vital signs were reviewed in today's medical record Physical Exam: General: Well developed , well nourished, no acute distress Skin: anicteric Head: Normocephalic and atraumatic Eyes:  sclerae anicteric, EOMI Ears: Normal auditory acuity Mouth: No deformity or lesions Neck: Supple, no masses or thyromegaly Lymph Nodes: no lymphadenopathy Lungs: Clear throughout to auscultation Heart: Regular rate and rhythm; no murmurs, rubs or bruits Gastroinestinal: Soft, non tender and non distended. No masses, hepatosplenomegaly or hernias noted. Normal Bowel sounds Rectal:deferred Musculoskeletal: Symmetrical with no gross deformities  Skin: No lesions on visible extremities Pulses:  Normal pulses  noted Extremities: No clubbing, cyanosis, edema or deformities noted Neurological: Alert oriented x 4, grossly nonfocal Cervical Nodes:  No significant cervical adenopathy Inguinal Nodes: No significant inguinal adenopathy Psychological:  Alert and cooperative. Normal mood and affect  See Assessment and Plan under Problem List

## 2015-08-28 NOTE — Assessment & Plan Note (Signed)
Plan followup colonoscopy 2018 

## 2015-08-29 ENCOUNTER — Other Ambulatory Visit: Payer: Self-pay

## 2015-08-29 DIAGNOSIS — R1310 Dysphagia, unspecified: Secondary | ICD-10-CM

## 2015-09-10 ENCOUNTER — Encounter: Payer: Commercial Managed Care - HMO | Admitting: Gastroenterology

## 2015-09-10 ENCOUNTER — Ambulatory Visit (AMBULATORY_SURGERY_CENTER): Payer: Commercial Managed Care - HMO | Admitting: Gastroenterology

## 2015-09-10 ENCOUNTER — Encounter: Payer: Self-pay | Admitting: Gastroenterology

## 2015-09-10 VITALS — BP 123/70 | HR 57 | Temp 96.6°F | Resp 12 | Ht 63.0 in | Wt 183.0 lb

## 2015-09-10 DIAGNOSIS — K222 Esophageal obstruction: Secondary | ICD-10-CM

## 2015-09-10 DIAGNOSIS — R1310 Dysphagia, unspecified: Secondary | ICD-10-CM

## 2015-09-10 DIAGNOSIS — I10 Essential (primary) hypertension: Secondary | ICD-10-CM | POA: Diagnosis not present

## 2015-09-10 DIAGNOSIS — E119 Type 2 diabetes mellitus without complications: Secondary | ICD-10-CM | POA: Diagnosis not present

## 2015-09-10 LAB — GLUCOSE, CAPILLARY
Glucose-Capillary: 109 mg/dL — ABNORMAL HIGH (ref 65–99)
Glucose-Capillary: 94 mg/dL (ref 65–99)

## 2015-09-10 MED ORDER — SODIUM CHLORIDE 0.9 % IV SOLN: 500.0000 mL | INTRAVENOUS | Status: DC

## 2015-09-10 NOTE — Progress Notes (Signed)
Called to room to assist during endoscopic procedure.  Patient ID and intended procedure confirmed with present staff. Received instructions for my participation in the procedure from the performing physician.  

## 2015-09-10 NOTE — Op Note (Signed)
[place]  [address]. [place], [id number]   ENDOSCOPY PROCEDURE REPORT  PATIENT: Melanie [name], Melanie [name]  MR#: [id number] BIRTHDATE: 1941/05/20 , 74  yrs. old GENDER: female ENDOSCOPIST: Inda Castle, MD REFERRED BY:  Annye Asa, M.D. PROCEDURE DATE:  09/10/2015 PROCEDURE:  EGD, diagnostic and Maloney dilation of esophagus ASA CLASS:     Class II INDICATIONS:  dysphagia. MEDICATIONS: Monitored anesthesia care and Propofol 160 mg IV TOPICAL ANESTHETIC:  DESCRIPTION OF PROCEDURE: After the risks benefits and alternatives of the procedure were thoroughly explained, informed consent was obtained.  The LB [id number] endoscope was introduced through the mouth and advanced to the second portion of the duodenum , Without limitations.  The instrument was slowly withdrawn as the mucosa was fully examined.    ESOPHAGUS: There was a peptic stricture at the gastroesophageal junction.  The stricture was traversable.  The stricture was dilated using a 17.5 mm (52Fr) Maloney dilator. There was moderate resistance to the 46mm dilator.  It was not advanced beyond 40cm. The esophagus was reintubated .  At the GE junction there was a small mucosal rent.  No further dilitation was done. Following this dilation, there was a small amount of heme and a small mucosal rent.   Except for the findings listed, the EGD was otherwise normal.   STOMACH: A 4 cm hiatal hernia was noted.  Retroflexed views revealed no abnormalities.     The scope was then withdrawn from the patient and the procedure completed.  COMPLICATIONS: There were no immediate complications.  ENDOSCOPIC IMPRESSION: Esophageal stricture Hiatal hernia  RECOMMENDATIONS: Repeat dilitation prn   REPEAT EXAM:  eSigned:  Inda Castle, MD 09/10/2015 2:02 PM    CC:

## 2015-09-10 NOTE — Patient Instructions (Signed)
Discharge instructions given. Handout on a dilatation diet. Resume previous medications. YOU HAD AN ENDOSCOPIC PROCEDURE TODAY AT THE [facility]:   Refer to the procedure report that was given to you for any specific questions about what was found during the examination.  If the procedure report does not answer your questions, please call your gastroenterologist to clarify.  If you requested that your care partner not be given the details of your procedure findings, then the procedure report has been included in a sealed envelope for you to review at your convenience later.  YOU SHOULD EXPECT: Some feelings of bloating in the abdomen. Passage of more gas than usual.  Walking can help get rid of the air that was put into your GI tract during the procedure and reduce the bloating. If you had a lower endoscopy (such as a colonoscopy or flexible sigmoidoscopy) you may notice spotting of blood in your stool or on the toilet paper. If you underwent a bowel prep for your procedure, you may not have a normal bowel movement for a few days.  Please Note:  You might notice some irritation and congestion in your nose or some drainage.  This is from the oxygen used during your procedure.  There is no need for concern and it should clear up in a day or so.  SYMPTOMS TO REPORT IMMEDIATELY:   Following upper endoscopy (EGD)  Vomiting of blood or coffee ground material  New chest pain or pain under the shoulder blades  Painful or persistently difficult swallowing  New shortness of breath  Fever of 100F or higher  Black, tarry-looking stools  For urgent or emergent issues, a gastroenterologist can be reached at any hour by calling [phone].   DIET: Your first meal following the procedure should be a small meal and then it is ok to progress to your normal diet. Heavy or fried foods are harder to digest and may make you feel nauseous or bloated.  Likewise, meals heavy in dairy and vegetables  can increase bloating.  Drink plenty of fluids but you should avoid alcoholic beverages for 24 hours.  ACTIVITY:  You should plan to take it easy for the rest of today and you should NOT DRIVE or use heavy machinery until tomorrow (because of the sedation medicines used during the test).    FOLLOW UP: Our staff will call the number listed on your records the next business day following your procedure to check on you and address any questions or concerns that you may have regarding the information given to you following your procedure. If we do not reach you, we will leave a message.  However, if you are feeling well and you are not experiencing any problems, there is no need to return our call.  We will assume that you have returned to your regular daily activities without incident.  If any biopsies were taken you will be contacted by phone or by letter within the next 1-3 weeks.  Please call us at [phone] if you have not heard about the biopsies in 3 weeks.    SIGNATURES/CONFIDENTIALITY: You and/or your care partner have signed paperwork which will be entered into your electronic medical record.  These signatures attest to the fact that that the information above on your After Visit Summary has been reviewed and is understood.  Full responsibility of the confidentiality of this discharge information lies with you and/or your care-partner. 

## 2015-09-10 NOTE — Progress Notes (Signed)
Report to PACU, RN, vss, BBS= Clear.  

## 2015-09-11 ENCOUNTER — Telehealth: Payer: Self-pay

## 2015-09-11 NOTE — Telephone Encounter (Signed)
  Follow up Call-  Call back number 09/10/2015  Post procedure Call Back phone  # [phone]   Permission to leave phone message No     Patient questions:  Do you have a fever, pain , or abdominal swelling? No. Pain Score  0 *  Have you tolerated food without any problems? Yes.    Have you been able to return to your normal activities? Yes.    Do you have any questions about your discharge instructions: Diet   No. Medications  No. Follow up visit  No.  Do you have questions or concerns about your Care? No.  Actions: * If pain score is 4 or above: No action needed, pain <4.  Per the pt, "my throat is a little sore".  But tolerated the soft diet ok.  I recommended to try warm water gargle, OTC Chloraceptic loz prn if needed.  Pt to call us back if sx do not resolve within a couple days.  Pt said she would call if needed. maw

## 2015-09-20 DIAGNOSIS — L821 Other seborrheic keratosis: Secondary | ICD-10-CM | POA: Diagnosis not present

## 2015-09-20 DIAGNOSIS — L814 Other melanin hyperpigmentation: Secondary | ICD-10-CM | POA: Diagnosis not present

## 2015-09-20 DIAGNOSIS — D225 Melanocytic nevi of trunk: Secondary | ICD-10-CM | POA: Diagnosis not present

## 2015-09-20 DIAGNOSIS — D1801 Hemangioma of skin and subcutaneous tissue: Secondary | ICD-10-CM | POA: Diagnosis not present

## 2015-09-25 DIAGNOSIS — H52223 Regular astigmatism, bilateral: Secondary | ICD-10-CM | POA: Diagnosis not present

## 2015-09-25 DIAGNOSIS — H524 Presbyopia: Secondary | ICD-10-CM | POA: Diagnosis not present

## 2015-09-25 DIAGNOSIS — H5203 Hypermetropia, bilateral: Secondary | ICD-10-CM | POA: Diagnosis not present

## 2015-09-25 DIAGNOSIS — H2513 Age-related nuclear cataract, bilateral: Secondary | ICD-10-CM | POA: Diagnosis not present

## 2015-09-25 DIAGNOSIS — E119 Type 2 diabetes mellitus without complications: Secondary | ICD-10-CM | POA: Diagnosis not present

## 2015-10-23 DIAGNOSIS — N3000 Acute cystitis without hematuria: Secondary | ICD-10-CM | POA: Diagnosis not present

## 2015-10-23 DIAGNOSIS — N3281 Overactive bladder: Secondary | ICD-10-CM | POA: Diagnosis not present

## 2015-11-21 ENCOUNTER — Telehealth: Payer: Self-pay | Admitting: Family Medicine

## 2015-11-21 NOTE — Telephone Encounter (Signed)
Talked to pt, declined AWV with nurse right now, she will not be goig to [place] so she will schedule with her new provider after her appt tomorrow.

## 2015-11-21 NOTE — Telephone Encounter (Signed)
We can see her for the follow up. Maybe she could schedule her wellness with Ashlee?

## 2015-11-21 NOTE — Telephone Encounter (Signed)
Called pt for reminder of appt 11/22/15 9:45am and noticed it is supposed to be CPE. Per notes on appt the time available conflict with her schedule as someone else has to bring her. I offered her to different 9:00am appt that are open and she said her friend cannot bring her that early. I offered her a 1:30pm cpe next week and she declined. Pt is wanting to come tomorrow and said if it's just a follow up that is fine. Please advise.

## 2015-11-21 NOTE — Telephone Encounter (Signed)
Noted  

## 2015-11-22 ENCOUNTER — Encounter: Payer: Self-pay | Admitting: General Practice

## 2015-11-22 ENCOUNTER — Ambulatory Visit (INDEPENDENT_AMBULATORY_CARE_PROVIDER_SITE_OTHER): Payer: Commercial Managed Care - HMO | Admitting: Family Medicine

## 2015-11-22 ENCOUNTER — Encounter: Payer: Self-pay | Admitting: Family Medicine

## 2015-11-22 VITALS — BP 130/86 | HR 80 | Temp 98.0°F | Resp 17 | Ht 63.0 in | Wt 184.2 lb

## 2015-11-22 DIAGNOSIS — Z23 Encounter for immunization: Secondary | ICD-10-CM | POA: Diagnosis not present

## 2015-11-22 DIAGNOSIS — E1142 Type 2 diabetes mellitus with diabetic polyneuropathy: Secondary | ICD-10-CM

## 2015-11-22 LAB — BASIC METABOLIC PANEL
BUN: 15 mg/dL (ref 6–23)
CALCIUM: 9.4 mg/dL (ref 8.4–10.5)
CHLORIDE: 103 meq/L (ref 96–112)
CO2: 30 meq/L (ref 19–32)
CREATININE: 1.06 mg/dL (ref 0.40–1.20)
GFR: 53.81 mL/min — ABNORMAL LOW (ref >60.00)
Glucose, Bld: 134 mg/dL — ABNORMAL HIGH (ref 70–99)
Potassium: 4.3 mEq/L (ref 3.5–5.1)
SODIUM: 140 meq/L (ref 135–145)

## 2015-11-22 LAB — HEMOGLOBIN A1C: HEMOGLOBIN A1C: 6.6 % — AB (ref 4.6–6.5)

## 2015-11-22 NOTE — Progress Notes (Signed)
   Subjective:    Patient ID: Melanie [name], female    DOB: February 09, 1941, 74 y.o.   MRN: [id number]  HPI DM- chronic problem, on Metformin.  On Lisinopril for renal protection.  UTD on eye exam.  UTD on foot exam.  Pt's last A1C 6.9.  Not checking home CBGs.  No symptomatic lows.  No CP, SOB, abd, N/V, HAs, visual changes, neuropathy above baseline.   Review of Systems For ROS see HPI     Objective:   Physical Exam  Constitutional: She is oriented to person, place, and time. She appears well-developed and well-nourished. No distress.  HENT:  Head: Normocephalic and atraumatic.  Eyes: Conjunctivae and EOM are normal. Pupils are equal, round, and reactive to light.  Neck: Normal range of motion. Neck supple. No thyromegaly present.  Cardiovascular: Normal rate, regular rhythm, normal heart sounds and intact distal pulses.   No murmur heard. Pulmonary/Chest: Effort normal and breath sounds normal. No respiratory distress.  Abdominal: Soft. She exhibits no distension. There is no tenderness.  Musculoskeletal: She exhibits no edema.  Lymphadenopathy:    She has no cervical adenopathy.  Neurological: She is alert and oriented to person, place, and time.  Skin: Skin is warm and dry.  Psychiatric: She has a normal mood and affect. Her behavior is normal.  Vitals reviewed.         Assessment & Plan:

## 2015-11-22 NOTE — Progress Notes (Signed)
Pre visit review using our clinic review tool, if applicable. No additional management support is needed unless otherwise documented below in the visit note. 

## 2015-11-22 NOTE — Patient Instructions (Signed)
Schedule a complete physical with your new provider in 3-4 months We'll notify you of your lab results and make any changes if needed Continue to work on healthy diet and regular activity- you can do it! Call with any questions or concerns Happy Holidays!!!

## 2015-11-22 NOTE — Assessment & Plan Note (Signed)
Chronic problem.  Pt is not checking home CBGs.  Asymptomatic w/ exception of baseline neuropathy but this has not progressed.  On ACE for renal protection.  UTD on foot exam, eye exam.  Check labs.  Adjust meds prn

## 2015-12-03 ENCOUNTER — Other Ambulatory Visit: Payer: Self-pay | Admitting: General Practice

## 2015-12-03 MED ORDER — CIMETIDINE 200 MG PO TABS: 200.0000 mg | ORAL_TABLET | Freq: Every day | ORAL | Status: DC

## 2015-12-10 ENCOUNTER — Other Ambulatory Visit: Payer: Self-pay | Admitting: Family Medicine

## 2015-12-11 NOTE — Telephone Encounter (Signed)
Medication filled to pharmacy as requested.   

## 2016-01-10 ENCOUNTER — Encounter: Payer: Commercial Managed Care - HMO | Admitting: Family Medicine

## 2016-03-13 ENCOUNTER — Encounter: Payer: Commercial Managed Care - HMO | Admitting: Family Medicine

## 2016-03-13 ENCOUNTER — Telehealth: Payer: Self-pay | Admitting: Family Medicine

## 2016-03-13 NOTE — Telephone Encounter (Signed)
Pt called in and has been vomiting all night. She will not be able to come in for 10:00am cpe today. Charge or no charge?

## 2016-03-13 NOTE — Telephone Encounter (Signed)
No charge. 

## 2016-03-24 ENCOUNTER — Other Ambulatory Visit: Payer: Self-pay | Admitting: Family Medicine

## 2016-03-25 NOTE — Telephone Encounter (Signed)
Medication filled to pharmacy as requested.   

## 2016-03-27 ENCOUNTER — Encounter: Payer: Commercial Managed Care - HMO | Admitting: Family Medicine

## 2016-04-03 ENCOUNTER — Telehealth: Payer: Self-pay | Admitting: Family Medicine

## 2016-04-03 MED ORDER — LISINOPRIL-HYDROCHLOROTHIAZIDE 10-12.5 MG PO TABS: 1.0000 | ORAL_TABLET | Freq: Every day | ORAL | Status: DC

## 2016-04-03 NOTE — Telephone Encounter (Signed)
Pt states that she is out of lisinopril and that her mail order will not arrive for another 7 days and asking if someone could call in a 7 day supply to [facility] on [address]. Pt also would like a call to let her know this has been called in.

## 2016-04-03 NOTE — Telephone Encounter (Signed)
Last filled: 12/11/15 Amt: 90,1 Last OV:  11/22/15   Rx sent to pharmacy as requested.  Pt notified and made aware.

## 2016-04-24 ENCOUNTER — Encounter: Payer: Self-pay | Admitting: Family Medicine

## 2016-04-24 ENCOUNTER — Ambulatory Visit (INDEPENDENT_AMBULATORY_CARE_PROVIDER_SITE_OTHER): Payer: Commercial Managed Care - HMO | Admitting: Family Medicine

## 2016-04-24 VITALS — BP 130/82 | HR 84 | Temp 98.1°F | Resp 16 | Ht 63.0 in | Wt 190.2 lb

## 2016-04-24 DIAGNOSIS — Z780 Asymptomatic menopausal state: Secondary | ICD-10-CM

## 2016-04-24 DIAGNOSIS — Z0000 Encounter for general adult medical examination without abnormal findings: Secondary | ICD-10-CM | POA: Diagnosis not present

## 2016-04-24 DIAGNOSIS — Z1231 Encounter for screening mammogram for malignant neoplasm of breast: Secondary | ICD-10-CM

## 2016-04-24 DIAGNOSIS — E1142 Type 2 diabetes mellitus with diabetic polyneuropathy: Secondary | ICD-10-CM

## 2016-04-24 DIAGNOSIS — I10 Essential (primary) hypertension: Secondary | ICD-10-CM | POA: Diagnosis not present

## 2016-04-24 DIAGNOSIS — E785 Hyperlipidemia, unspecified: Secondary | ICD-10-CM | POA: Diagnosis not present

## 2016-04-24 DIAGNOSIS — Z23 Encounter for immunization: Secondary | ICD-10-CM | POA: Diagnosis not present

## 2016-04-24 LAB — CBC WITH DIFFERENTIAL/PLATELET
BASOS PCT: 1 %
Basophils Absolute: 104 cells/uL (ref 0–200)
EOS PCT: 1 %
Eosinophils Absolute: 104 cells/uL (ref 15–500)
HCT: 41.6 % (ref 35.0–45.0)
HEMOGLOBIN: 14 g/dL (ref 11.7–15.5)
LYMPHS ABS: 1456 {cells}/uL (ref 850–3900)
Lymphocytes Relative: 14 %
MCH: 30.1 pg (ref 27.0–33.0)
MCHC: 33.7 g/dL (ref 32.0–36.0)
MCV: 89.5 fL (ref 80.0–100.0)
MPV: 10.9 fL (ref 7.5–12.5)
Monocytes Absolute: 624 cells/uL (ref 200–950)
Monocytes Relative: 6 %
NEUTROS PCT: 78 %
Neutro Abs: 8112 cells/uL — ABNORMAL HIGH (ref 1500–7800)
Platelets: 253 10*3/uL (ref 140–400)
RBC: 4.65 MIL/uL (ref 3.80–5.10)
RDW: 13.2 % (ref 11.0–15.0)
WBC: 10.4 10*3/uL (ref 3.8–10.8)

## 2016-04-24 LAB — BASIC METABOLIC PANEL
BUN: 13 mg/dL (ref 7–25)
CHLORIDE: 100 mmol/L (ref 98–110)
CO2: 25 mmol/L (ref 20–31)
Calcium: 9.4 mg/dL (ref 8.6–10.4)
Creat: 0.84 mg/dL (ref 0.60–0.93)
GLUCOSE: 125 mg/dL — AB (ref 65–99)
POTASSIUM: 3.9 mmol/L (ref 3.5–5.3)
SODIUM: 140 mmol/L (ref 135–146)

## 2016-04-24 LAB — LIPID PANEL
CHOLESTEROL: 231 mg/dL — AB (ref 125–200)
HDL: 36 mg/dL — ABNORMAL LOW (ref >=46)
LDL Cholesterol: 121 mg/dL (ref <130)
Total CHOL/HDL Ratio: 6.4 Ratio — ABNORMAL HIGH (ref <=5.0)
Triglycerides: 368 mg/dL — ABNORMAL HIGH (ref <150)
VLDL: 74 mg/dL — ABNORMAL HIGH (ref <30)

## 2016-04-24 LAB — HEMOGLOBIN A1C
Hgb A1c MFr Bld: 6.8 % — ABNORMAL HIGH (ref <5.7)
Mean Plasma Glucose: 148 mg/dL

## 2016-04-24 LAB — HEPATIC FUNCTION PANEL
ALK PHOS: 50 U/L (ref 33–130)
ALT: 11 U/L (ref 6–29)
AST: 15 U/L (ref 10–35)
Albumin: 4.5 g/dL (ref 3.6–5.1)
BILIRUBIN INDIRECT: 0.3 mg/dL (ref 0.2–1.2)
Bilirubin, Direct: 0.1 mg/dL (ref <=0.2)
TOTAL PROTEIN: 6.7 g/dL (ref 6.1–8.1)
Total Bilirubin: 0.4 mg/dL (ref 0.2–1.2)

## 2016-04-24 NOTE — Progress Notes (Signed)
   Subjective:    Patient ID: Melanie [name], female    DOB: [birth year], 75 y.o.   MRN: [id number]  HPI Here today for CPE.  Risk Factors: Hyperlipidemia- chronic problem, on fish oil, Simvastatin HTN- chronic problem, on Lisinopril HCTZ, Dilt. DM- chronic problem, on Metformin.  On ACE for renal protection.  UTD on foot and eye exam. Physical Activity: no formal exercise Fall Risk: low Depression: denies Hearing: normal to conversational tones, mildly decreased to whispered voice ADL's: independent Cognitive: normal linear thought process, memory and attention intact Home Safety: safe at home- spending almost 24 hrs/day w/ elderly woman w/ Alzheimers Height, Weight, BMI, Visual Acuity: see vitals, vision corrected to 20/20 w/ glasses Counseling: UTD on colonoscopy (due 2023) , due for mammo, D[facility] ([facility]) Care team reviewed and updated w/ pt Labs Ordered: See A&P Care Plan: See A&P   Review of Systems Patient reports no vision/ hearing changes, adenopathy,fever, weight change,  persistant/recurrent hoarseness , swallowing issues, chest pain, palpitations, edema, persistant/recurrent cough, hemoptysis, dyspnea (rest/exertional/paroxysmal nocturnal), gastrointestinal bleeding (melena, rectal bleeding), abdominal pain, significant heartburn, bowel changes, GU symptoms (dysuria, hematuria, incontinence), Gyn symptoms (abnormal  bleeding, pain),  syncope, focal weakness, memory loss, numbness & tingling, skin/hair/nail changes, abnormal bruising or bleeding, anxiety, or depression.     Objective:   Physical Exam General Appearance:    Alert, cooperative, no distress, appears stated age, obese  Head:    Normocephalic, without obvious abnormality, atraumatic  Eyes:    PERRL, conjunctiva/corneas clear, EOM's intact, fundi    benign, both eyes  Ears:    Normal TM's and external ear canals, both ears  Nose:   Nares normal, septum midline, mucosa normal, no drainage    or sinus  tenderness  Throat:   Lips, mucosa, and tongue normal; teeth and gums normal  Neck:   Supple, symmetrical, trachea midline, no adenopathy;    Thyroid: no enlargement/tenderness/nodules  Back:     Symmetric, no curvature, ROM normal, no CVA tenderness  Lungs:     Clear to auscultation bilaterally, respirations unlabored  Chest Wall:    No tenderness or deformity   Heart:    Regular rate and rhythm, S1 and S2 normal, no murmur, rub   or gallop  Breast Exam:    Deferred to mammo  Abdomen:     Soft, non-tender, bowel sounds active all four quadrants,    no masses, no organomegaly  Genitalia:    Deferred  Rectal:    Extremities:   Extremities normal, atraumatic, no cyanosis or edema  Pulses:   2+ and symmetric all extremities  Skin:   Skin color, texture, turgor normal, no rashes or lesions  Lymph nodes:   Cervical, supraclavicular, and axillary nodes normal  Neurologic:   CNII-XII intact, normal strength, sensation and reflexes    throughout          Assessment & Plan:

## 2016-04-24 NOTE — Assessment & Plan Note (Signed)
Chronic problem.  Tolerating statin w/o difficult.  Encouraged healthy diet and regular exercise.  Check labs.  Adjust meds prn

## 2016-04-24 NOTE — Patient Instructions (Signed)
Follow up in 3-4 months to recheck diabetes We'll notify you of your lab results and make any changes if needed Continue to work on healthy diet and regular exercise We'll call you with your mammogram and bone density appt You are up to date on colonoscopy!  Not due until 2023! Call with any questions or concerns Happy Spring!!!

## 2016-04-24 NOTE — Assessment & Plan Note (Signed)
Pt's PE WNL w/ exception of obesity.  UTD on colonoscopy.  Due for mammo and DEXA- orders entered.  Prevnar given today.  Written screening schedule updated and given to pt.  Check labs.  Anticipatory guidance provided.

## 2016-04-24 NOTE — Assessment & Plan Note (Signed)
Chronic problem.  Well controlled.  Asymptomatic.  Check labs.  No anticipated med changes. 

## 2016-04-24 NOTE — Progress Notes (Signed)
Pre visit review using our clinic review tool, if applicable. No additional management support is needed unless otherwise documented below in the visit note. 

## 2016-04-24 NOTE — Assessment & Plan Note (Signed)
Chronic problem.  Tolerating metformin w/o difficulty.  Foot exam done today.  UTD on eye exam.  On ACE for renal protection.  Check labs.  Adjust meds prn

## 2016-04-25 LAB — TSH: TSH: 1.81 m[IU]/L

## 2016-05-15 ENCOUNTER — Ambulatory Visit
Admission: RE | Admit: 2016-05-15 | Discharge: 2016-05-15 | Disposition: A | Discharge status: Home / Self Care | Payer: Commercial Managed Care - HMO | Source: Ambulatory Visit | Attending: Family Medicine | Admitting: Family Medicine

## 2016-05-15 ENCOUNTER — Encounter: Payer: Self-pay | Admitting: General Practice

## 2016-05-15 DIAGNOSIS — Z780 Asymptomatic menopausal state: Secondary | ICD-10-CM

## 2016-05-15 DIAGNOSIS — M85851 Other specified disorders of bone density and structure, right thigh: Secondary | ICD-10-CM | POA: Diagnosis not present

## 2016-05-15 DIAGNOSIS — Z1231 Encounter for screening mammogram for malignant neoplasm of breast: Secondary | ICD-10-CM | POA: Diagnosis not present

## 2016-06-23 ENCOUNTER — Other Ambulatory Visit: Payer: Self-pay | Admitting: Family Medicine

## 2016-06-25 NOTE — Telephone Encounter (Signed)
Medication filled to pharmacy as requested.   

## 2016-08-06 ENCOUNTER — Encounter: Payer: Self-pay | Admitting: Family Medicine

## 2016-08-06 ENCOUNTER — Ambulatory Visit (INDEPENDENT_AMBULATORY_CARE_PROVIDER_SITE_OTHER): Payer: Commercial Managed Care - HMO | Admitting: Family Medicine

## 2016-08-06 VITALS — BP 130/72 | HR 90 | Temp 98.1°F | Resp 16 | Ht 63.0 in | Wt 186.2 lb

## 2016-08-06 DIAGNOSIS — R350 Frequency of micturition: Secondary | ICD-10-CM

## 2016-08-06 DIAGNOSIS — N390 Urinary tract infection, site not specified: Secondary | ICD-10-CM | POA: Diagnosis not present

## 2016-08-06 DIAGNOSIS — E1142 Type 2 diabetes mellitus with diabetic polyneuropathy: Secondary | ICD-10-CM | POA: Diagnosis not present

## 2016-08-06 DIAGNOSIS — R82998 Other abnormal findings in urine: Secondary | ICD-10-CM

## 2016-08-06 LAB — POCT URINALYSIS DIPSTICK
Bilirubin, UA: NEGATIVE
Glucose, UA: NEGATIVE
KETONES UA: NEGATIVE
Nitrite, UA: NEGATIVE
PH UA: 6
PROTEIN UA: NEGATIVE
RBC UA: NEGATIVE
SPEC GRAV UA: 1.025
Urobilinogen, UA: 0.2

## 2016-08-06 LAB — BASIC METABOLIC PANEL
BUN: 14 mg/dL (ref 6–23)
CALCIUM: 10.2 mg/dL (ref 8.4–10.5)
CHLORIDE: 101 meq/L (ref 96–112)
CO2: 28 mEq/L (ref 19–32)
CREATININE: 0.97 mg/dL (ref 0.40–1.20)
GFR: 59.49 mL/min — AB (ref >60.00)
Glucose, Bld: 189 mg/dL — ABNORMAL HIGH (ref 70–99)
Potassium: 3.9 mEq/L (ref 3.5–5.1)
Sodium: 138 mEq/L (ref 135–145)

## 2016-08-06 LAB — HEMOGLOBIN A1C: HEMOGLOBIN A1C: 6.9 % — AB (ref 4.6–6.5)

## 2016-08-06 NOTE — Patient Instructions (Addendum)
Follow up in 3-4 months to recheck diabetes, blood pressure and cholesterol We'll notify you of your lab results and make any changes if needed Continue to work on healthy diet and regular exercise Drink plenty of fluids We'll call you with your eye appt Call with any questions or concerns Enjoy the rest of your summer!!!

## 2016-08-06 NOTE — Assessment & Plan Note (Signed)
Recurrent issue for pt.  Check UA and culture.  Start abx if needed.  Reviewed supportive care and red flags that should prompt return.  Pt expressed understanding and is in agreement w/ plan.

## 2016-08-06 NOTE — Progress Notes (Signed)
Pre visit review using our clinic review tool, if applicable. No additional management support is needed unless otherwise documented below in the visit note. 

## 2016-08-06 NOTE — Addendum Note (Signed)
Addended by: Davis Gourd on: 08/06/2016 11:49 AM   Modules accepted: Orders

## 2016-08-06 NOTE — Assessment & Plan Note (Signed)
Chronic problem.  Tolerating Metformin w/o difficulty.  Due for eye exam- referral placed.  UTD on foot exam.  On ACE for renal protection.  Encouraged healthy diet and regular exercise.  Check labs.  Adjust meds prn

## 2016-08-06 NOTE — Progress Notes (Signed)
   Subjective:    Patient ID: Melanie [name], female    DOB: November 06, 1941, 75 y.o.   MRN: [id number]  HPI DM- chronic problem.  On Metformin twice daily.  On ACE for renal protection.  UTD on foot exam.  Due for eye exam- 'i know my eyes are getting worse'.  Not checking CBGs at home.  Denies symptomatic lows.  No CP, SOB, HAs.  Pt reports eyes are worsening.  No abd pain, N/V, edema.  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  Urinary frequency- sxs started 4-5 days ago.  Mild dysuria.  Hx of recurrent UTIs.  Taking OTC Cranberry w/o relief.     Review of Systems For ROS see HPI     Objective:   Physical Exam  Constitutional: She is oriented to person, place, and time. She appears well-developed and well-nourished. No distress.  HENT:  Head: Normocephalic and atraumatic.  Eyes: Conjunctivae and EOM are normal. Pupils are equal, round, and reactive to light.  Neck: Normal range of motion. Neck supple. No thyromegaly present.  Cardiovascular: Normal rate, regular rhythm, normal heart sounds and intact distal pulses.   No murmur heard. Pulmonary/Chest: Effort normal and breath sounds normal. No respiratory distress.  Abdominal: Soft. She exhibits no distension. There is no tenderness.  Musculoskeletal: She exhibits no edema.  Lymphadenopathy:    She has no cervical adenopathy.  Neurological: She is alert and oriented to person, place, and time.  Skin: Skin is warm and dry.  Psychiatric: She has a normal mood and affect. Her behavior is normal.  Vitals reviewed.         Assessment & Plan:

## 2016-08-07 ENCOUNTER — Encounter: Payer: Self-pay | Admitting: General Practice

## 2016-08-07 LAB — URINE CULTURE

## 2016-08-27 ENCOUNTER — Other Ambulatory Visit: Payer: Self-pay | Admitting: Family Medicine

## 2016-09-11 ENCOUNTER — Telehealth: Payer: Self-pay | Admitting: Family Medicine

## 2016-09-11 DIAGNOSIS — R350 Frequency of micturition: Secondary | ICD-10-CM

## 2016-09-11 NOTE — Telephone Encounter (Signed)
Pt states that she needs a referral to see her urologist. She has her appt for next tues 9/26 @ 10:45 to see R Louis Meckel (NPI [id number])

## 2016-09-11 NOTE — Telephone Encounter (Signed)
Referral placed.

## 2016-09-16 DIAGNOSIS — N3020 Other chronic cystitis without hematuria: Secondary | ICD-10-CM | POA: Diagnosis not present

## 2016-09-25 ENCOUNTER — Other Ambulatory Visit: Payer: Self-pay | Admitting: Physician Assistant

## 2016-10-05 DIAGNOSIS — J209 Acute bronchitis, unspecified: Secondary | ICD-10-CM | POA: Diagnosis not present

## 2016-10-05 DIAGNOSIS — J0100 Acute maxillary sinusitis, unspecified: Secondary | ICD-10-CM | POA: Diagnosis not present

## 2016-10-14 DIAGNOSIS — E119 Type 2 diabetes mellitus without complications: Secondary | ICD-10-CM | POA: Diagnosis not present

## 2016-10-14 DIAGNOSIS — H524 Presbyopia: Secondary | ICD-10-CM | POA: Diagnosis not present

## 2016-10-14 DIAGNOSIS — H2513 Age-related nuclear cataract, bilateral: Secondary | ICD-10-CM | POA: Diagnosis not present

## 2016-10-14 LAB — HM DIABETES EYE EXAM

## 2016-10-15 ENCOUNTER — Encounter: Payer: Self-pay | Admitting: General Practice

## 2016-11-27 ENCOUNTER — Ambulatory Visit (INDEPENDENT_AMBULATORY_CARE_PROVIDER_SITE_OTHER): Payer: Commercial Managed Care - HMO | Admitting: Family Medicine

## 2016-11-27 ENCOUNTER — Encounter: Payer: Self-pay | Admitting: Family Medicine

## 2016-11-27 VITALS — BP 130/78 | HR 77 | Temp 97.9°F | Resp 18 | Ht 63.0 in | Wt 191.0 lb

## 2016-11-27 DIAGNOSIS — E785 Hyperlipidemia, unspecified: Secondary | ICD-10-CM | POA: Diagnosis not present

## 2016-11-27 DIAGNOSIS — Z23 Encounter for immunization: Secondary | ICD-10-CM | POA: Diagnosis not present

## 2016-11-27 DIAGNOSIS — I10 Essential (primary) hypertension: Secondary | ICD-10-CM

## 2016-11-27 DIAGNOSIS — E1142 Type 2 diabetes mellitus with diabetic polyneuropathy: Secondary | ICD-10-CM

## 2016-11-27 LAB — BASIC METABOLIC PANEL
BUN: 10 mg/dL (ref 6–23)
CALCIUM: 10.1 mg/dL (ref 8.4–10.5)
CHLORIDE: 101 meq/L (ref 96–112)
CO2: 31 meq/L (ref 19–32)
Creatinine, Ser: 0.95 mg/dL (ref 0.40–1.20)
GFR: 60.89 mL/min (ref >60.00)
GLUCOSE: 163 mg/dL — AB (ref 70–99)
POTASSIUM: 4.1 meq/L (ref 3.5–5.1)
SODIUM: 140 meq/L (ref 135–145)

## 2016-11-27 LAB — HEPATIC FUNCTION PANEL
ALK PHOS: 58 U/L (ref 39–117)
ALT: 13 U/L (ref 0–35)
AST: 14 U/L (ref 0–37)
Albumin: 4.5 g/dL (ref 3.5–5.2)
BILIRUBIN DIRECT: 0.1 mg/dL (ref 0.0–0.3)
BILIRUBIN TOTAL: 0.6 mg/dL (ref 0.2–1.2)
TOTAL PROTEIN: 7.1 g/dL (ref 6.0–8.3)

## 2016-11-27 LAB — LIPID PANEL
Cholesterol: 217 mg/dL — ABNORMAL HIGH (ref 0–200)
HDL: 43.6 mg/dL (ref >39.00)
NONHDL: 173.17
TRIGLYCERIDES: 231 mg/dL — AB (ref 0.0–149.0)
Total CHOL/HDL Ratio: 5
VLDL: 46.2 mg/dL — ABNORMAL HIGH (ref 0.0–40.0)

## 2016-11-27 LAB — LDL CHOLESTEROL, DIRECT: Direct LDL: 145 mg/dL

## 2016-11-27 LAB — CBC WITH DIFFERENTIAL/PLATELET
BASOS ABS: 0 10*3/uL (ref 0.0–0.1)
BASOS PCT: 0.6 % (ref 0.0–3.0)
EOS ABS: 0.3 10*3/uL (ref 0.0–0.7)
Eosinophils Relative: 3.4 % (ref 0.0–5.0)
HCT: 41.8 % (ref 36.0–46.0)
Hemoglobin: 14.1 g/dL (ref 12.0–15.0)
LYMPHS ABS: 1.6 10*3/uL (ref 0.7–4.0)
Lymphocytes Relative: 20.3 % (ref 12.0–46.0)
MCHC: 33.8 g/dL (ref 30.0–36.0)
MCV: 89.7 fl (ref 78.0–100.0)
Monocytes Absolute: 0.5 10*3/uL (ref 0.1–1.0)
Monocytes Relative: 6.8 % (ref 3.0–12.0)
NEUTROS ABS: 5.3 10*3/uL (ref 1.4–7.7)
NEUTROS PCT: 68.9 % (ref 43.0–77.0)
PLATELETS: 227 10*3/uL (ref 150.0–400.0)
RBC: 4.66 Mil/uL (ref 3.87–5.11)
RDW: 13.1 % (ref 11.5–15.5)
WBC: 7.7 10*3/uL (ref 4.0–10.5)

## 2016-11-27 LAB — HEMOGLOBIN A1C: HEMOGLOBIN A1C: 7.4 % — AB (ref 4.6–6.5)

## 2016-11-27 LAB — TSH: TSH: 1.53 u[IU]/mL (ref 0.35–4.50)

## 2016-11-27 NOTE — Assessment & Plan Note (Signed)
Chronic problem.  Adequate control today.  Asymptomatic.  Check labs.  No anticipated med changes.  Will follow. 

## 2016-11-27 NOTE — Progress Notes (Signed)
Pre visit review using our clinic review tool, if applicable. No additional management support is needed unless otherwise documented below in the visit note. 

## 2016-11-27 NOTE — Assessment & Plan Note (Signed)
Chronic problem.  On Metformin twice daily.  On ACE for renal protection.  UTD on foot and eye exams.  Not exercising but is trying to follow a low carb diet.  Stressed the importance of this.  Check labs.  Adjust meds prn

## 2016-11-27 NOTE — Patient Instructions (Signed)
Follow up in 3 months to recheck sugars We'll notify you of your lab results and make any changes if needed Try and get regular exercise and continue to work on a low carb diet- you can do it!! Call with any questions or concerns Happy Holidays!!!

## 2016-11-27 NOTE — Progress Notes (Signed)
   Subjective:    Patient ID: Melanie [name], female    DOB: 19-Jan-1941, 75 y.o.   MRN: [id number]  HPI DM- chronic problem, on Metformin twice daily.  On ACE for renal protection.  UTD on foot exam, eye exam.  Pt has gained 5 lbs since last visit.  Pt admits to limited physical activity.  Still attempting to follow low carb diet.  denies symptomatic lows.  No numbness/tingling of hands/feet above baseline  HTN- chronic problem, on Diltiazem, Lisinopril HCTZ.  Blood pressure is well controlled.  No CP, SOB, HAs, visual changes, edema.  Hyperlipidemia- chronic problem, on Simvastatin daily.  Cholesterol was not at goal last visit.  No abd pain, N/V.   Review of Systems For ROS see HPI     Objective:   Physical Exam  Constitutional: She is oriented to person, place, and time. She appears well-developed and well-nourished. No distress.  HENT:  Head: Normocephalic and atraumatic.  Eyes: Conjunctivae and EOM are normal. Pupils are equal, round, and reactive to light.  Neck: Normal range of motion. Neck supple. No thyromegaly present.  Cardiovascular: Normal rate, regular rhythm, normal heart sounds and intact distal pulses.   No murmur heard. Pulmonary/Chest: Effort normal and breath sounds normal. No respiratory distress.  Abdominal: Soft. She exhibits no distension. There is no tenderness.  Musculoskeletal: She exhibits no edema.  Lymphadenopathy:    She has no cervical adenopathy.  Neurological: She is alert and oriented to person, place, and time.  Skin: Skin is warm and dry.  Psychiatric: She has a normal mood and affect. Her behavior is normal.  Vitals reviewed.         Assessment & Plan:

## 2016-11-27 NOTE — Assessment & Plan Note (Signed)
Chronic problem.  On Simvastatin daily.  Tolerating statin w/o difficulty.  Check labs.  Adjust meds prn

## 2016-11-28 ENCOUNTER — Other Ambulatory Visit: Payer: Self-pay | Admitting: General Practice

## 2016-11-28 MED ORDER — METFORMIN HCL 1000 MG PO TABS: 1000.0000 mg | ORAL_TABLET | Freq: Two times a day (BID) | ORAL | 1 refills | Status: DC

## 2016-11-28 MED ORDER — ATORVASTATIN CALCIUM 40 MG PO TABS: 40.0000 mg | ORAL_TABLET | Freq: Every day | ORAL | 1 refills | Status: DC

## 2016-11-30 ENCOUNTER — Encounter (HOSPITAL_COMMUNITY): Payer: Self-pay | Admitting: Emergency Medicine

## 2016-11-30 ENCOUNTER — Ambulatory Visit (HOSPITAL_COMMUNITY)
Admission: EM | Admit: 2016-11-30 | Discharge: 2016-11-30 | Disposition: A | Discharge status: Home / Self Care | Payer: Commercial Managed Care - HMO | Attending: Emergency Medicine | Admitting: Emergency Medicine

## 2016-11-30 DIAGNOSIS — E1141 Type 2 diabetes mellitus with diabetic mononeuropathy: Secondary | ICD-10-CM | POA: Diagnosis not present

## 2016-11-30 DIAGNOSIS — Z7984 Long term (current) use of oral hypoglycemic drugs: Secondary | ICD-10-CM | POA: Insufficient documentation

## 2016-11-30 DIAGNOSIS — Z7982 Long term (current) use of aspirin: Secondary | ICD-10-CM | POA: Diagnosis not present

## 2016-11-30 DIAGNOSIS — R350 Frequency of micturition: Secondary | ICD-10-CM | POA: Diagnosis not present

## 2016-11-30 DIAGNOSIS — Z79899 Other long term (current) drug therapy: Secondary | ICD-10-CM | POA: Diagnosis not present

## 2016-11-30 DIAGNOSIS — N3001 Acute cystitis with hematuria: Secondary | ICD-10-CM

## 2016-11-30 LAB — POCT URINALYSIS DIP (DEVICE)
Bilirubin Urine: NEGATIVE
Glucose, UA: NEGATIVE mg/dL
KETONES UR: 15 mg/dL — AB
Nitrite: NEGATIVE
PH: 6.5 (ref 5.0–8.0)
Protein, ur: >=300 mg/dL — AB
Specific Gravity, Urine: 1.025 (ref 1.005–1.030)
Urobilinogen, UA: 1 mg/dL (ref 0.0–1.0)

## 2016-11-30 MED ORDER — CIPROFLOXACIN HCL 500 MG PO TABS: 500.0000 mg | ORAL_TABLET | Freq: Two times a day (BID) | ORAL | 0 refills | Status: DC

## 2016-11-30 NOTE — ED Provider Notes (Signed)
[place]    CSN: [id number] Arrival date & time: 11/30/16  1802     History   Chief Complaint Chief Complaint  Patient presents with  . Dysuria  . Urinary Frequency    HPI Melanie [name] is a 75 y.o. female.   HPI  She is a 75 year old woman here for urinary complaints. She reports a 2 day history of urinary frequency, urgency, and dysuria. She developed some hematuria today as well. Denies any fevers, nausea, or vomiting. No abdominal pain. She does have a history of urinary tract infections.  Past Medical History:  Diagnosis Date  . Allergic rhinitis   . Arthritis   . Diabetes mellitus   . GERD (gastroesophageal reflux disease)   . Hyperlipidemia   . Hypertension   . Pain in joint, ankle and foot 09/14/2013    Patient Active Problem List   Diagnosis Date Noted  . Family history of colon cancer 08/28/2015  . Dysphagia 08/21/2015  . GERD (gastroesophageal reflux disease) 06/14/2015  . Urinary frequency 05/08/2014  . Inclusion cyst 01/17/2014  . Onychomycosis 09/14/2013  . Pain in joint, ankle and foot 09/14/2013  . Foreign body in foot, left, infected 09/06/2013  . Laceration of head 12/07/2012  . Annual physical exam 07/06/2012  . Colon polyps 07/06/2012  . Sinusitis 04/06/2012  . HTN (hypertension) 01/14/2012  . DM (diabetes mellitus) type II controlled, neurological manifestation ([place]) 01/14/2012  . Neuropathy of lower extremity 01/14/2012  . Neck pain 01/14/2012  . Hyperlipidemia 01/14/2012    Past Surgical History:  Procedure Laterality Date  . FRACTURE SURGERY Left     leg  . hiatal hernia surgery  2010  . ROTATOR CUFF REPAIR Right 2010  . TONSILLECTOMY      OB History    No data available       Home Medications    Prior to Admission medications   Medication Sig Start Date End Date Taking? Authorizing Provider  aspirin (ASPIRIN EC) 81 MG EC tablet Take 81 mg by mouth daily. Swallow whole.   Yes Historical Provider, MD    cimetidine (TAGAMET) 200 MG tablet TAKE 1 TABLET EVERY DAY 08/28/16  Yes [name], MD  CRANBERRY PO Take 2 tablets by mouth daily.   Yes Historical Provider, MD  diltiazem (CARDIZEM) 90 MG tablet TAKE 1 TABLET TWICE DAILY 06/25/16  Yes [name], MD  fish oil-omega-3 fatty acids 1000 MG capsule Take 2 g by mouth daily.   Yes Historical Provider, MD  fluticasone (FLONASE) 50 MCG/ACT nasal spray USE TWO SPRAY(S) IN EACH NOSTRIL ONCE DAILY 09/25/16  Yes [name], MD  lisinopril-hydrochlorothiazide (PRINZIDE,ZESTORETIC) 10-12.5 MG tablet TAKE 1 TABLET EVERY DAY 06/25/16  Yes [name], MD  metFORMIN (GLUCOPHAGE) 1000 MG tablet Take 1 tablet (1,000 mg total) by mouth 2 (two) times daily with a meal. 11/28/16  Yes [name], MD  Probiotic Product (PROBIOTIC PO) Take 1 tablet by mouth daily.   Yes Historical Provider, MD  ranitidine (ZANTAC) 300 MG tablet Take 1 tablet (300 mg total) by mouth at bedtime. 06/14/15  Yes [name], MD  simvastatin (ZOCOR) 40 MG tablet Take 40 mg by mouth daily.   Yes Historical Provider, MD  solifenacin (VESICARE) 5 MG tablet Take 5 mg by mouth daily.   Yes Historical Provider, MD  Vitamin Mixture (VITAMIN E COMPLETE PO) Take by mouth.   Yes Historical Provider, MD  Ascorbic Acid (VITAMIN C) 1000 MG tablet  Take 1,000 mg by mouth 2 (two) times daily.    Historical Provider, MD  atorvastatin (LIPITOR) 40 MG tablet Take 1 tablet (40 mg total) by mouth daily. 11/28/16   [name], MD  Cholecalciferol (VITAMIN D PO) Take by mouth daily.    Historical Provider, MD  ciprofloxacin (CIPRO) 500 MG tablet Take 1 tablet (500 mg total) by mouth 2 (two) times daily. 11/30/16   Melony Overly, MD  Cyanocobalamin (VITAMIN B-12) 1000 MCG SUBL Place 1 each under the tongue daily.    Historical Provider, MD    Family History Family History  Problem Relation Age of Onset  . Colon cancer Brother 85  . Colon cancer Maternal Aunt 50  .  Breast cancer Paternal Grandmother   . Breast cancer Maternal Aunt   . Diabetes Maternal Aunt   . Heart disease Mother   . Stomach cancer Neg Hx     Social History Social History  Substance Use Topics  . Smoking status: Never Smoker  . Smokeless tobacco: Never Used  . Alcohol use No     Allergies   Niacin and related and Keflex [cephalexin]   Review of Systems Review of Systems As in history of present illness  Physical Exam Triage Vital Signs ED Triage Vitals  Enc Vitals Group     BP 11/30/16 1845 163/87     Pulse Rate 11/30/16 1845 84     Resp 11/30/16 1845 18     Temp 11/30/16 1845 98.6 F (37 C)     Temp Source 11/30/16 1845 Oral     SpO2 11/30/16 1845 98 %     Weight --      Height --      Head Circumference --      Peak Flow --      Pain Score 11/30/16 1850 0     Pain Loc --      Pain Edu? --      Excl. in [place]? --    No data found.   Updated Vital Signs BP 163/87 (BP Location: Left Arm)   Pulse 84   Temp 98.6 F (37 C) (Oral)   Resp 18   SpO2 98%   Visual Acuity Right Eye Distance:   Left Eye Distance:   Bilateral Distance:    Right Eye Near:   Left Eye Near:    Bilateral Near:     Physical Exam  Constitutional: She is oriented to person, place, and time. She appears well-developed and well-nourished. No distress.  Cardiovascular: Normal rate.   Pulmonary/Chest: Effort normal.  Abdominal:  No CVA tenderness  Neurological: She is alert and oriented to person, place, and time.     UC Treatments / Results  Labs (all labs ordered are listed, but only abnormal results are displayed) Labs Reviewed  POCT URINALYSIS DIP (DEVICE) - Abnormal; Notable for the following:       Result Value   Ketones, ur 15 (*)    Hgb urine dipstick LARGE (*)    Protein, ur >=300 (*)    Leukocytes, UA SMALL (*)    All other components within normal limits  URINE CULTURE    EKG  EKG Interpretation None       Radiology No results  found.  Procedures Procedures (including critical care time)  Medications Ordered in UC Medications - No data to display   Initial Impression / Assessment and Plan / UC Course  I have reviewed the triage vital signs and  the nursing notes.  Pertinent labs & imaging results that were available during my care of the patient were reviewed by me and considered in my medical decision making (see chart for details).  Clinical Course     We'll treat with Cipro. Urine sent for culture. Follow-up as needed.  Final Clinical Impressions(s) / UC Diagnoses   Final diagnoses:  Acute cystitis with hematuria    New Prescriptions Discharge Medication List as of 11/30/2016  7:26 PM    START taking these medications   Details  ciprofloxacin (CIPRO) 500 MG tablet Take 1 tablet (500 mg total) by mouth 2 (two) times daily., Starting Sun 11/30/2016, Normal         Melony Overly, MD 11/30/16 2019

## 2016-11-30 NOTE — ED Triage Notes (Signed)
The patient presented to the [facility] with a complaint of urinary frequency, urgency and dysuria that started 2 days ago. The patient also reported some hematuria today.

## 2016-11-30 NOTE — Discharge Instructions (Signed)
You have a urinary tract infection. Take Cipro twice a day for 7 days. We will call you if we need to change antibiotics based on culture results. Make sure you are drinking plenty of water. Follow-up as needed.

## 2016-12-03 LAB — URINE CULTURE

## 2016-12-18 ENCOUNTER — Other Ambulatory Visit: Payer: Self-pay | Admitting: Family Medicine

## 2016-12-29 ENCOUNTER — Emergency Department (HOSPITAL_COMMUNITY)
Admission: EM | Admit: 2016-12-29 | Discharge: 2016-12-29 | Disposition: A | Discharge status: Home / Self Care | Payer: Medicare HMO | Attending: Emergency Medicine | Admitting: Emergency Medicine

## 2016-12-29 ENCOUNTER — Emergency Department (HOSPITAL_COMMUNITY): Payer: Medicare HMO

## 2016-12-29 ENCOUNTER — Encounter (HOSPITAL_COMMUNITY): Payer: Self-pay

## 2016-12-29 DIAGNOSIS — Z79899 Other long term (current) drug therapy: Secondary | ICD-10-CM | POA: Insufficient documentation

## 2016-12-29 DIAGNOSIS — Z7982 Long term (current) use of aspirin: Secondary | ICD-10-CM | POA: Diagnosis not present

## 2016-12-29 DIAGNOSIS — I10 Essential (primary) hypertension: Secondary | ICD-10-CM | POA: Diagnosis not present

## 2016-12-29 DIAGNOSIS — Z7984 Long term (current) use of oral hypoglycemic drugs: Secondary | ICD-10-CM | POA: Diagnosis not present

## 2016-12-29 DIAGNOSIS — E119 Type 2 diabetes mellitus without complications: Secondary | ICD-10-CM | POA: Diagnosis not present

## 2016-12-29 DIAGNOSIS — R002 Palpitations: Secondary | ICD-10-CM | POA: Diagnosis not present

## 2016-12-29 DIAGNOSIS — R079 Chest pain, unspecified: Secondary | ICD-10-CM | POA: Diagnosis not present

## 2016-12-29 LAB — CBC
HEMATOCRIT: 43.4 % (ref 36.0–46.0)
Hemoglobin: 14.8 g/dL (ref 12.0–15.0)
MCH: 30.5 pg (ref 26.0–34.0)
MCHC: 34.1 g/dL (ref 30.0–36.0)
MCV: 89.5 fL (ref 78.0–100.0)
PLATELETS: 233 10*3/uL (ref 150–400)
RBC: 4.85 MIL/uL (ref 3.87–5.11)
RDW: 13 % (ref 11.5–15.5)
WBC: 11 10*3/uL — AB (ref 4.0–10.5)

## 2016-12-29 LAB — I-STAT TROPONIN, ED: Troponin i, poc: 0 ng/mL (ref 0.00–0.08)

## 2016-12-29 LAB — BASIC METABOLIC PANEL
ANION GAP: 10 (ref 5–15)
BUN: 12 mg/dL (ref 6–20)
CO2: 24 mmol/L (ref 22–32)
Calcium: 9.9 mg/dL (ref 8.9–10.3)
Chloride: 105 mmol/L (ref 101–111)
Creatinine, Ser: 1 mg/dL (ref 0.44–1.00)
GFR, EST NON AFRICAN AMERICAN: 54 mL/min — AB (ref >60)
Glucose, Bld: 176 mg/dL — ABNORMAL HIGH (ref 65–99)
POTASSIUM: 4 mmol/L (ref 3.5–5.1)
SODIUM: 139 mmol/L (ref 135–145)

## 2016-12-29 LAB — TROPONIN I

## 2016-12-29 LAB — TSH: TSH: 2.4 u[IU]/mL (ref 0.350–4.500)

## 2016-12-29 NOTE — ED Triage Notes (Signed)
Pt reports left sided CP and palpitations that started yesterday, went away but returned again today. Denies SOB. CP radiates to left shoulder.

## 2016-12-29 NOTE — ED Notes (Signed)
EKG hand delivered to Dr. Sherry Ruffing

## 2016-12-29 NOTE — ED Provider Notes (Signed)
[facility] DEPT Provider Note   CSN: [id number] Arrival date & time: 12/29/16  1221     History   Chief Complaint Chief Complaint  Patient presents with  . Chest Pain    HPI Melanie [name] is a 76 y.o. female.   Palpitations   This is a recurrent problem. The current episode started 1 to 2 hours ago. The problem occurs constantly. The problem has been resolved. On average, each episode lasts 2 hours. Pertinent negatives include no diaphoresis, no fever and no numbness. She has tried deep relaxation and breathing exercises for the symptoms. The treatment provided no relief. Risk factors include obesity, post menopause and stress.    Past Medical History:  Diagnosis Date  . Allergic rhinitis   . Arthritis   . Diabetes mellitus   . GERD (gastroesophageal reflux disease)   . Hyperlipidemia   . Hypertension   . Pain in joint, ankle and foot 09/14/2013    Patient Active Problem List   Diagnosis Date Noted  . Family history of colon cancer 08/28/2015  . Dysphagia 08/21/2015  . GERD (gastroesophageal reflux disease) 06/14/2015  . Urinary frequency 05/08/2014  . Inclusion cyst 01/17/2014  . Onychomycosis 09/14/2013  . Pain in joint, ankle and foot 09/14/2013  . Foreign body in foot, left, infected 09/06/2013  . Laceration of head 12/07/2012  . Annual physical exam 07/06/2012  . Colon polyps 07/06/2012  . Sinusitis 04/06/2012  . HTN (hypertension) 01/14/2012  . DM (diabetes mellitus) type II controlled, neurological manifestation ([place]) 01/14/2012  . Neuropathy of lower extremity 01/14/2012  . Neck pain 01/14/2012  . Hyperlipidemia 01/14/2012    Past Surgical History:  Procedure Laterality Date  . FRACTURE SURGERY Left     leg  . hiatal hernia surgery  2010  . ROTATOR CUFF REPAIR Right 2010  . TONSILLECTOMY      OB History    No data available       Home Medications    Prior to Admission medications   Medication Sig Start Date End Date Taking?  Authorizing Provider  Ascorbic Acid (VITAMIN C) 1000 MG tablet Take 1,000 mg by mouth 2 (two) times daily.   Yes Historical Provider, MD  aspirin (ASPIRIN EC) 81 MG EC tablet Take 81 mg by mouth daily. Swallow whole.   Yes Historical Provider, MD  Calcium Carb-Cholecalciferol (CALTRATE 600+D3 SOFT PO) Take 1 tablet by mouth 2 (two) times daily.   Yes Historical Provider, MD  CRANBERRY PO Take 2 tablets by mouth daily.   Yes Historical Provider, MD  Cyanocobalamin (VITAMIN B-12) 1000 MCG SUBL Place 1 each under the tongue daily.   Yes Historical Provider, MD  diltiazem (CARDIZEM) 90 MG tablet TAKE 1 TABLET TWICE DAILY 12/18/16  Yes [name], MD  fish oil-omega-3 fatty acids 1000 MG capsule Take 2 g by mouth daily.   Yes Historical Provider, MD  fluticasone (FLONASE) 50 MCG/ACT nasal spray USE TWO SPRAY(S) IN EACH NOSTRIL ONCE DAILY Patient taking differently: USE TWO SPRAY(S) IN EACH NOSTRIL ONCE DAILY AS NEEDED FOR CONGESTION 09/25/16  Yes [name], MD  lisinopril-hydrochlorothiazide (PRINZIDE,ZESTORETIC) 10-12.5 MG tablet TAKE 1 TABLET EVERY DAY 12/18/16  Yes [name], MD  metFORMIN (GLUCOPHAGE) 500 MG tablet TAKE 1 TABLET TWICE DAILY WITH MEALS 12/18/16  Yes [name], MD  Probiotic Product (PROBIOTIC PO) Take 1 capsule by mouth daily.    Yes Historical Provider, MD  ranitidine (ZANTAC) 300 MG tablet Take 1 tablet (300 mg  total) by mouth at bedtime. 06/14/15  Yes [name], MD  solifenacin (VESICARE) 5 MG tablet Take 5 mg by mouth daily as needed (for "bladder issues").    Yes Historical Provider, MD  atorvastatin (LIPITOR) 40 MG tablet Take 1 tablet (40 mg total) by mouth daily. Patient not taking: Reported on 12/29/2016 11/28/16   [name], MD  cimetidine (TAGAMET) 200 MG tablet TAKE 1 TABLET EVERY DAY Patient not taking: Reported on 12/29/2016 12/18/16   [name], MD  ciprofloxacin (CIPRO) 500 MG tablet Take 1 tablet (500 mg total)  by mouth 2 (two) times daily. Patient not taking: Reported on 12/29/2016 11/30/16   Melony Overly, MD  metFORMIN (GLUCOPHAGE) 1000 MG tablet Take 1 tablet (1,000 mg total) by mouth 2 (two) times daily with a meal. Patient not taking: Reported on 12/29/2016 11/28/16   [name], MD    Family History Family History  Problem Relation Age of Onset  . Colon cancer Brother 35  . Colon cancer Maternal Aunt 29  . Breast cancer Paternal Grandmother   . Breast cancer Maternal Aunt   . Diabetes Maternal Aunt   . Heart disease Mother   . Stomach cancer Neg Hx     Social History Social History  Substance Use Topics  . Smoking status: Never Smoker  . Smokeless tobacco: Never Used  . Alcohol use No     Allergies   Niacin and related; Keflex [cephalexin]; Sausage [pickled meat]; and Tape   Review of Systems Review of Systems  Constitutional: Negative for diaphoresis and fever.  Respiratory: Positive for chest tightness (with the palpitations).   Cardiovascular: Positive for palpitations.  Neurological: Negative for numbness.  All other systems reviewed and are negative.    Physical Exam Updated Vital Signs BP 118/72   Pulse 69   Temp 98 F (36.7 C) (Oral)   Resp 14   SpO2 97%   Physical Exam  Constitutional: She appears well-developed and well-nourished.  HENT:  Head: Normocephalic and atraumatic.  Eyes: Conjunctivae and EOM are normal.  Neck: Normal range of motion.  Cardiovascular: Normal rate and regular rhythm.   Pulmonary/Chest: Effort normal. No stridor. No respiratory distress.  Abdominal: Soft. She exhibits no distension.  Musculoskeletal: Normal range of motion. She exhibits no edema or deformity.  Neurological: She is alert.  Skin: Skin is warm and dry.  Nursing note and vitals reviewed.    ED Treatments / Results  Labs (all labs ordered are listed, but only abnormal results are displayed) Labs Reviewed  BASIC METABOLIC PANEL - Abnormal; Notable for  the following:       Result Value   Glucose, Bld 176 (*)    GFR calc non Af Amer 54 (*)    All other components within normal limits  CBC - Abnormal; Notable for the following:    WBC 11.0 (*)    All other components within normal limits  TSH  TROPONIN I  I-STAT TROPOININ, ED    EKG  EKG Interpretation  Date/Time:  Monday December 29 2016 12:31:54 EST Ventricular Rate:  94 PR Interval:  152 QRS Duration: 80 QT Interval:  336 QTC Calculation: 420 R Axis:   110 Text Interpretation:  Normal sinus rhythm Left posterior fascicular block Cannot rule out Anterior infarct , age undetermined Abnormal ECG No significant change since last tracing Confirmed by [facility] MD, Corene Cornea [phone]) on 12/29/2016 7:10:01 PM       Radiology Dg Chest 2 View  Result Date: 12/29/2016 CLINICAL DATA:  Central and left axillary chest pain for 6 hours. EXAM: CHEST  2 VIEW COMPARISON:  One-view chest x-ray 02/13/2009 FINDINGS: Heart size is normal. A moderate-sized hiatal hernia is present. Atherosclerotic calcifications are present at the aortic arch. Minimal atelectasis or scarring is present at the left base. The lungs are otherwise clear. There is no edema or effusion. IMPRESSION: 1. Minimal atelectasis or scarring at the left base. 2. Moderate-sized hiatal hernia. 3. Aortic atherosclerosis. Electronically Signed   By: San Morelle M.D.   On: 12/29/2016 13:12    Procedures Procedures (including critical care time)  Medications Ordered in ED Medications - No data to display   Initial Impression / Assessment and Plan / ED Course  I have reviewed the triage vital signs and the nursing notes.  Pertinent labs & imaging results that were available during my care of the patient were reviewed by me and considered in my medical decision making (see chart for details).  Clinical Course     Palpitations of uncertain etiology. Monitored here for multiple hours without event. Labs unremarkable. Appears well.  Will have fu w/ pcp to get holter monitor. Already on diltiazem and aspirin so will not start any new medications.   Final Clinical Impressions(s) / ED Diagnoses   Final diagnoses:  Palpitations    New Prescriptions Discharge Medication List as of 12/29/2016 10:25 PM       Merrily Pew, MD 12/30/16 1320

## 2017-01-01 ENCOUNTER — Ambulatory Visit (INDEPENDENT_AMBULATORY_CARE_PROVIDER_SITE_OTHER): Payer: Medicare HMO | Admitting: Family Medicine

## 2017-01-01 ENCOUNTER — Encounter: Payer: Self-pay | Admitting: Family Medicine

## 2017-01-01 VITALS — BP 142/82 | HR 73 | Temp 98.1°F | Resp 16 | Ht 63.0 in | Wt 186.5 lb

## 2017-01-01 DIAGNOSIS — R002 Palpitations: Secondary | ICD-10-CM | POA: Diagnosis not present

## 2017-01-01 DIAGNOSIS — I10 Essential (primary) hypertension: Secondary | ICD-10-CM | POA: Diagnosis not present

## 2017-01-01 DIAGNOSIS — E1142 Type 2 diabetes mellitus with diabetic polyneuropathy: Secondary | ICD-10-CM

## 2017-01-01 MED ORDER — LISINOPRIL-HYDROCHLOROTHIAZIDE 20-12.5 MG PO TABS: 1.0000 | ORAL_TABLET | Freq: Every day | ORAL | 3 refills | Status: DC

## 2017-01-01 NOTE — Assessment & Plan Note (Signed)
New.  Reviewed recent ER notes, labs, EKG, and CXR.  Pt reports she is no longer having the ongoing palpitations like she did prior to ER evaluation but she continues to have 'flutters'- mostly when stressed.  Asymptomatic today.  Will get holter to assess for possible arrhythmia.  Reviewed supportive care and red flags that should prompt return.  Pt expressed understanding and is in agreement w/ plan.

## 2017-01-01 NOTE — Progress Notes (Signed)
   Subjective:    Patient ID: Melanie [name], female    DOB: 11-05-1941, 76 y.o.   MRN: [id number]  HPI ER f/u- pt was seen on 1/8 w/ palpitations.  There was no obvious cause at time of d/c.  Pt was told to f/u w/ PCP to arrange holter monitor.  Pt reports she had palpitations for 30 minutes 1 night and 'a couple of hours' the next night- this was worse w/ lying down.  Had some associated CP under L arm.  Pt has not had a similar episode since ER visit but has had a few 'flutters'.  Pt reports she will get the 'flutters' when she is stressed.  Denies SOB.  No HAs.  Denies visual changes, dizziness.  Pt has not had palpitations previously.  Labs from ER were normal w/ exception of WBC 11 and glucose 176.  EKG was unchanged from previous.  Pt's BP is mildly elevated but she has been eating a lot of dill pickles and pimento cheese.   Review of Systems For ROS see HPI     Objective:   Physical Exam  Constitutional: She is oriented to person, place, and time. She appears well-developed and well-nourished. No distress.  HENT:  Head: Normocephalic and atraumatic.  Eyes: Conjunctivae and EOM are normal. Pupils are equal, round, and reactive to light.  Neck: Normal range of motion. Neck supple. No thyromegaly present.  Cardiovascular: Normal rate, regular rhythm, normal heart sounds and intact distal pulses.   No murmur heard. Pulmonary/Chest: Effort normal and breath sounds normal. No respiratory distress.  Abdominal: Soft. She exhibits no distension. There is no tenderness.  Musculoskeletal: She exhibits no edema.  Lymphadenopathy:    She has no cervical adenopathy.  Neurological: She is alert and oriented to person, place, and time.  Skin: Skin is warm and dry.  Psychiatric: She has a normal mood and affect. Her behavior is normal.  Vitals reviewed.         Assessment & Plan:

## 2017-01-01 NOTE — Progress Notes (Signed)
Pre visit review using our clinic review tool, if applicable. No additional management support is needed unless otherwise documented below in the visit note. 

## 2017-01-01 NOTE — Assessment & Plan Note (Signed)
Deteriorated.  Pt's BP has been elevated at the last few visits.  This may be related to her increased salt intake but will increase the Lisinopril to 20mg  daily while leaving the HCTZ and Diltiazem the same.  Pt expressed understanding and is in agreement w/ plan.

## 2017-01-01 NOTE — Addendum Note (Signed)
Addended by: Davis Gourd on: 01/01/2017 02:52 PM   Modules accepted: Orders

## 2017-01-01 NOTE — Patient Instructions (Signed)
Follow up the beginning of March to recheck diabetes and BP We'll call you with your appt to get the holter monitor Increase the Lisinopril/HCTZ to 20/12.5mg  daily (new prescription at [facility]) Limit your salt intake! Drink plenty of water Call with any questions or concerns Hang in there!!!

## 2017-01-09 ENCOUNTER — Encounter: Payer: Self-pay | Admitting: Family Medicine

## 2017-01-09 ENCOUNTER — Ambulatory Visit (INDEPENDENT_AMBULATORY_CARE_PROVIDER_SITE_OTHER): Payer: Medicare HMO | Admitting: Family Medicine

## 2017-01-09 ENCOUNTER — Other Ambulatory Visit: Payer: Self-pay | Admitting: General Practice

## 2017-01-09 VITALS — BP 122/82 | HR 74 | Temp 98.2°F | Resp 16 | Ht 63.0 in | Wt 184.0 lb

## 2017-01-09 DIAGNOSIS — N390 Urinary tract infection, site not specified: Secondary | ICD-10-CM | POA: Diagnosis not present

## 2017-01-09 DIAGNOSIS — R319 Hematuria, unspecified: Secondary | ICD-10-CM

## 2017-01-09 LAB — POCT URINALYSIS DIPSTICK
BILIRUBIN UA: NEGATIVE
GLUCOSE UA: NEGATIVE
KETONES UA: NEGATIVE
Nitrite, UA: POSITIVE
PH UA: 7
SPEC GRAV UA: 1.015
Urobilinogen, UA: NEGATIVE

## 2017-01-09 MED ORDER — CIPROFLOXACIN HCL 500 MG PO TABS: 500.0000 mg | ORAL_TABLET | Freq: Two times a day (BID) | ORAL | 0 refills | Status: DC

## 2017-01-09 NOTE — Progress Notes (Signed)
   Subjective:    Patient ID: Melanie [name], female    DOB: 1941-08-21, 76 y.o.   MRN: [id number]  HPI UTI- sxs started 'yesterday or the day before'.  + dysuria, frequency, urgency.  Had incontinence this AM.  No fevers.  No back pain.   Review of Systems For ROS see HPI     Objective:   Physical Exam  Constitutional: She is oriented to person, place, and time. She appears well-developed and well-nourished. No distress.  Cardiovascular: Normal rate, regular rhythm and normal heart sounds.   Pulmonary/Chest: Effort normal and breath sounds normal. No respiratory distress. She has no wheezes. She has no rales.  Abdominal: Soft. She exhibits no distension. There is no tenderness (no suprapubic or CVA tenderness).  Neurological: She is alert and oriented to person, place, and time.  Skin: Skin is warm and dry.  Psychiatric: She has a normal mood and affect. Her behavior is normal. Thought content normal.  Vitals reviewed.         Assessment & Plan:  UTI- pt's sxs and UA are consistent w/ infxn.  Start Cipro as pt has done well with this in the past.  Reviewed supportive care and red flags that should prompt return.  Pt expressed understanding and is in agreement w/ plan.

## 2017-01-09 NOTE — Progress Notes (Signed)
Pre visit review using our clinic review tool, if applicable. No additional management support is needed unless otherwise documented below in the visit note. 

## 2017-01-09 NOTE — Patient Instructions (Signed)
It appears you have another infection Start the Cipro twice daily Drink plenty of fluids Make sure if you don't hear from Cardiology next week, you let me know! Call with any questions or concerns Hang in there!!!

## 2017-01-12 ENCOUNTER — Telehealth: Payer: Self-pay | Admitting: *Deleted

## 2017-01-12 NOTE — Telephone Encounter (Signed)
Patient scheduled for 48 hour holter monitor.  Expressed to patient pre certification is not necessary for a holter monitor.

## 2017-01-13 ENCOUNTER — Ambulatory Visit (INDEPENDENT_AMBULATORY_CARE_PROVIDER_SITE_OTHER): Payer: Medicare HMO

## 2017-01-13 DIAGNOSIS — R002 Palpitations: Secondary | ICD-10-CM | POA: Diagnosis not present

## 2017-01-16 ENCOUNTER — Telehealth: Payer: Self-pay | Admitting: Family Medicine

## 2017-01-16 NOTE — Telephone Encounter (Signed)
Pt made aware that we have not received results.

## 2017-01-16 NOTE — Telephone Encounter (Signed)
Patient calling to see if pcp has results of heart monitor.

## 2017-01-22 NOTE — Telephone Encounter (Signed)
I am the ordering provider but I do not yet have results.  Please ask her to bear with [place] b/c until Cardiology reads and results, there is nothing for [place] to do

## 2017-01-22 NOTE — Telephone Encounter (Signed)
Please advise this is stil listed as in process. Should pt contact cards?

## 2017-01-22 NOTE — Telephone Encounter (Signed)
Patient notified of PCP recommendations and is agreement and expresses an understanding.  

## 2017-01-22 NOTE — Telephone Encounter (Signed)
Patient calling back to see if pcp has received results of holter moniter yet.

## 2017-01-23 ENCOUNTER — Other Ambulatory Visit: Payer: Self-pay | Admitting: Family Medicine

## 2017-01-23 DIAGNOSIS — I480 Paroxysmal atrial fibrillation: Secondary | ICD-10-CM

## 2017-01-23 NOTE — Progress Notes (Signed)
Called pt and lmovm to return call.

## 2017-01-29 ENCOUNTER — Ambulatory Visit (INDEPENDENT_AMBULATORY_CARE_PROVIDER_SITE_OTHER): Payer: Medicare HMO | Admitting: Cardiovascular Disease

## 2017-01-29 ENCOUNTER — Encounter: Payer: Self-pay | Admitting: Cardiovascular Disease

## 2017-01-29 ENCOUNTER — Encounter (INDEPENDENT_AMBULATORY_CARE_PROVIDER_SITE_OTHER): Payer: Self-pay

## 2017-01-29 VITALS — BP 118/80 | HR 146 | Ht 64.0 in | Wt 183.0 lb

## 2017-01-29 DIAGNOSIS — I4891 Unspecified atrial fibrillation: Secondary | ICD-10-CM | POA: Diagnosis not present

## 2017-01-29 MED ORDER — APIXABAN 5 MG PO TABS: 5.0000 mg | ORAL_TABLET | Freq: Two times a day (BID) | ORAL | 6 refills | Status: DC

## 2017-01-29 MED ORDER — METOPROLOL TARTRATE 25 MG PO TABS: 25.0000 mg | ORAL_TABLET | Freq: Two times a day (BID) | ORAL | 6 refills | Status: DC

## 2017-01-29 NOTE — Patient Instructions (Signed)
Medication Instructions:  Your physician has recommended you make the following change in your medication:  Stop Aspirin Start Eliquis 5 mg by mouth twice daily. Start metoprolol tartrate 25 mg by mouth twice daily.    Labwork: none  Testing/Procedures: Your physician has requested that you have an echocardiogram. Echocardiography is a painless test that uses sound waves to create images of your heart. It provides your doctor with information about the size and shape of your heart and how well your heart's chambers and valves are working. This procedure takes approximately one hour. There are no restrictions for this procedure.    Follow-Up: Your physician recommends that you schedule a follow-up appointment on Monday-2/12 in Atrial fib clinic    Any Other Special Instructions Will Be Listed Below (If Applicable).     If you need a refill on your cardiac medications before your next appointment, please call your pharmacy.

## 2017-01-29 NOTE — Progress Notes (Signed)
Cardiology Office Note Date:  02/01/2017   ID:  Melanie [name], DOB [birth year], MRN [id number]  PCP:  Annye Asa, MD  Cardiologist:  Sherren Mocha, MD    Chief Complaint  Patient presents with  . New Patient (Initial Visit)     History of Present Illness: Melanie [name] is a 76 y.o. female who presents for evaluation of atrial fibrillation.   The patient is here alone today. She awoke this morning with heart palpitations at 6:00 am. She feels tired and dizzy at the time of my interview. She initially experienced tachy-palpitations one month ago and was evaluated in the Emergency Dept but was in sinus rhythm by the time she arrived there. An outpatient 48 hour Holter monitor was arranged and demonstrated atrial fibrillation with burden 17% and otherwise was in sinus rhythm.   She grew up in [place], [place] but has lived in [place] since her early 20's. 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 She has done well until the past month when she began having spells of tachy-palpitations. She has occasional pain in the left chest associated with her palpitations, but states she only feels this pain when she pushes on her chest. Denies chest pain at the time of my evaluation. No shortness of breath, lightheadedness, or syncope.   Past Medical History:  Diagnosis Date  . Allergic rhinitis   . Arthritis   . Diabetes mellitus   . GERD (gastroesophageal reflux disease)   . Hyperlipidemia   . Hypertension   . Pain in joint, ankle and foot 09/14/2013    Past Surgical History:  Procedure Laterality Date  . FRACTURE SURGERY Left     leg  . hiatal hernia surgery  2010  . ROTATOR CUFF REPAIR Right 2010  . TONSILLECTOMY      Current Outpatient Prescriptions  Medication Sig Dispense Refill  . Ascorbic Acid (VITAMIN C) 1000 MG tablet Take 1,000 mg by mouth 2 (two) times daily.    [name] atorvastatin (LIPITOR) 40 MG tablet Take 1 tablet (40 mg total)  by mouth daily. 90 tablet 1  . Calcium Carb-Cholecalciferol (CALTRATE 600+D3 SOFT PO) Take 1 tablet by mouth 2 (two) times daily.    . cimetidine (TAGAMET) 200 MG tablet Take 200 mg by mouth daily.    . ciprofloxacin (CIPRO) 500 MG tablet Take 1 tablet (500 mg total) by mouth 2 (two) times daily. 10 tablet 0  . CRANBERRY PO Take 2 tablets by mouth daily.    . Cyanocobalamin (VITAMIN B-12) 1000 MCG SUBL Place 1 each under the tongue daily.    [name] diltiazem (CARDIZEM) 90 MG tablet Take 90 mg by mouth 2 (two) times daily.    . fish oil-omega-3 fatty acids 1000 MG capsule Take 2 g by mouth daily.    . fluticasone (FLONASE) 50 MCG/ACT nasal spray Place 2 sprays into both nostrils daily as needed for allergies or rhinitis.    [name] lisinopril-hydrochlorothiazide (PRINZIDE,ZESTORETIC) 20-12.5 MG tablet Take 1 tablet by mouth daily. 30 tablet 3  . metFORMIN (GLUCOPHAGE) 500 MG tablet Take 500 mg by mouth 2 (two) times daily with a meal.    . Probiotic Product (PROBIOTIC PO) Take 1 capsule by mouth daily.     . ranitidine (ZANTAC) 300 MG tablet Take 1 tablet (300 mg total) by mouth at bedtime. 30 tablet 3  . solifenacin (VESICARE) 5 MG tablet Take 5 mg by mouth daily as needed (for "bladder issues").     [name]  apixaban (ELIQUIS) 5 MG TABS tablet Take 1 tablet (5 mg total) by mouth 2 (two) times daily. 60 tablet 6  . metoprolol tartrate (LOPRESSOR) 25 MG tablet Take 1 tablet (25 mg total) by mouth 2 (two) times daily. 60 tablet 6   No current facility-administered medications for this visit.     Allergies:   Niacin and related; Keflex [cephalexin]; Sausage [pickled meat]; and Tape   Social History:  The patient  reports that she has never smoked. She has never used smokeless tobacco. She reports that she does not drink alcohol or use drugs.   Family History:  The patient's  family history includes Breast cancer in her maternal aunt and paternal grandmother; Colon cancer (age of onset: 44) in her maternal aunt;  Colon cancer (age of onset: 65) in her brother; Diabetes in her maternal aunt; Heart disease in her mother.    ROS:  Please see the history of present illness.   All other systems are reviewed and negative.    PHYSICAL EXAM: VS:  BP 118/80   Pulse (!) 146   Ht 5\' 4"  (1.626 m)   Wt 183 lb (83 kg)   BMI 31.41 kg/m  , BMI Body mass index is 31.41 kg/m. GEN: Well nourished, well developed, in no acute distress  HEENT: normal  Neck: no JVD, no masses. No carotid bruits Cardiac: tachy, irregular without murmur or gallop                Respiratory:  clear to auscultation bilaterally, normal work of breathing GI: soft, nontender, nondistended, + BS MS: no deformity or atrophy  Ext: no pretibial edema, pedal pulses 2+= bilaterally Skin: warm and dry, no rash Neuro:  Strength and sensation are intact Psych: euthymic mood, full affect  EKG:  EKG is ordered today. The ekg ordered today shows atrial fibrillation with RVR 140 bpm  Recent Labs: 11/27/2016: ALT 13 12/29/2016: BUN 12; Creatinine, Ser 1.00; Hemoglobin 14.8; Platelets 233; Potassium 4.0; Sodium 139; TSH 2.400   Lipid Panel     Component Value Date/Time   CHOL 217 (H) 11/27/2016 1115   TRIG 231.0 (H) 11/27/2016 1115   HDL 43.60 11/27/2016 1115   CHOLHDL 5 11/27/2016 1115   VLDL 46.2 (H) 11/27/2016 1115   LDLCALC 121 04/24/2016 1615   LDLDIRECT 145.0 11/27/2016 1115      Wt Readings from Last 3 Encounters:  01/29/17 183 lb (83 kg)  01/09/17 184 lb (83.5 kg)  01/01/17 186 lb 8 oz (84.6 kg)     Cardiac Studies Reviewed: Holter: Study Highlights     Predominantly normal sinus rhythm. Occasional PACs.  Intermittent atrial fibrillation (17%), at times with rapid ventricular response.   Consideration should be made for anticoagulation, rate control meds as well as echocardiogram and cardiology consultation.   ASSESSMENT AND PLAN: 1.  New onset paroxysmal atrial fibrillation. CHADS-Vasc = 5 for female, HTN, DM, age  64.   This patients CHA2DS2-VASc Score and unadjusted Ischemic Stroke Rate (% per year) is equal to 7.2 % stroke rate/year from a score of 5  Above score calculated as 1 point each if present [CHF, HTN, DM, Vascular=MI/PAD/Aortic Plaque, Age if 65-74, or Female] Above score calculated as 2 points each if present [Age > 75, or Stroke/TIA/TE]  We discussed considerations of rate versus rhythm control, prophylaxis for thromboembolism with oral anticoagulant drugs. We contrasted warfarin versus DOAC drugs.   TSH has been recently checked. Will check an echo to evaluate for valvular  disease, LV function, and atrial size.   The patient will start metoprolol 25 mg BID in addition to cardizem. She will start Apixaban 5 mg BID. Will arrange FU in the AFib clinic next week.   She understands to call EMS if she develops symptoms of lightheadedness, dyspnea, chest pain, or syncope.    Current medicines are reviewed with the patient today.  The patient does not have concerns regarding medicines.  Labs/ tests ordered today include:   Orders Placed This Encounter  Procedures  . EKG 12-Lead  . ECHOCARDIOGRAM COMPLETE    Disposition:   [facility] AFib Clinic next week  Signed, Sherren Mocha, MD  02/01/2017 10:31 PM    [facility] [place], [place], [place]  [address] Phone: [phone]; Fax: [phone]

## 2017-02-02 ENCOUNTER — Ambulatory Visit (HOSPITAL_COMMUNITY): Payer: Medicare HMO | Admitting: Nurse Practitioner

## 2017-02-06 ENCOUNTER — Encounter (HOSPITAL_COMMUNITY): Payer: Self-pay | Admitting: Nurse Practitioner

## 2017-02-06 ENCOUNTER — Other Ambulatory Visit: Payer: Self-pay | Admitting: Pharmacist

## 2017-02-06 ENCOUNTER — Ambulatory Visit (HOSPITAL_COMMUNITY)
Admission: RE | Admit: 2017-02-06 | Discharge: 2017-02-06 | Disposition: A | Discharge status: Home / Self Care | Payer: Medicare HMO | Source: Ambulatory Visit | Attending: Nurse Practitioner | Admitting: Nurse Practitioner

## 2017-02-06 VITALS — BP 118/62 | HR 53 | Ht 64.0 in | Wt 186.6 lb

## 2017-02-06 DIAGNOSIS — Z79899 Other long term (current) drug therapy: Secondary | ICD-10-CM | POA: Insufficient documentation

## 2017-02-06 DIAGNOSIS — Z803 Family history of malignant neoplasm of breast: Secondary | ICD-10-CM | POA: Insufficient documentation

## 2017-02-06 DIAGNOSIS — E119 Type 2 diabetes mellitus without complications: Secondary | ICD-10-CM | POA: Insufficient documentation

## 2017-02-06 DIAGNOSIS — I480 Paroxysmal atrial fibrillation: Secondary | ICD-10-CM

## 2017-02-06 DIAGNOSIS — I4891 Unspecified atrial fibrillation: Secondary | ICD-10-CM | POA: Insufficient documentation

## 2017-02-06 DIAGNOSIS — Z8249 Family history of ischemic heart disease and other diseases of the circulatory system: Secondary | ICD-10-CM | POA: Diagnosis not present

## 2017-02-06 DIAGNOSIS — Z9889 Other specified postprocedural states: Secondary | ICD-10-CM | POA: Insufficient documentation

## 2017-02-06 DIAGNOSIS — Z7982 Long term (current) use of aspirin: Secondary | ICD-10-CM | POA: Diagnosis not present

## 2017-02-06 DIAGNOSIS — Z0001 Encounter for general adult medical examination with abnormal findings: Secondary | ICD-10-CM | POA: Insufficient documentation

## 2017-02-06 DIAGNOSIS — I10 Essential (primary) hypertension: Secondary | ICD-10-CM | POA: Diagnosis not present

## 2017-02-06 DIAGNOSIS — Z808 Family history of malignant neoplasm of other organs or systems: Secondary | ICD-10-CM | POA: Insufficient documentation

## 2017-02-06 DIAGNOSIS — Z7984 Long term (current) use of oral hypoglycemic drugs: Secondary | ICD-10-CM | POA: Insufficient documentation

## 2017-02-06 MED ORDER — METOPROLOL TARTRATE 25 MG PO TABS: 25.0000 mg | ORAL_TABLET | Freq: Two times a day (BID) | ORAL | 3 refills | Status: DC

## 2017-02-06 MED ORDER — APIXABAN 5 MG PO TABS: 5.0000 mg | ORAL_TABLET | Freq: Two times a day (BID) | ORAL | 1 refills | Status: DC

## 2017-02-06 NOTE — Progress Notes (Signed)
Primary Care Physician: Annye Asa, MD Referring Physician:    JENNIFE RAZAVI is a 76 y.o. female with h/o DM, HTN.  Recently diagnosed with afib. She has been placed on metoprolol 25 mg bid and since this was started last week, she has not noticed any further afib. She has not started her eliquis. She knows that she cannot afford this long term. She has picked up from pharmacy but has just continued to take her ASA daily. She does not smoke or use alcohol. Denies a snoring history. Is active. Echo scheduled and is pending 2/23.  Today, she denies symptoms of palpitations, chest pain, shortness of breath, orthopnea, PND, lower extremity edema, dizziness, presyncope, syncope, or neurologic sequela. The patient is tolerating medications without difficulties and is otherwise without complaint today.   Past Medical History:  Diagnosis Date  . Allergic rhinitis   . Arthritis   . Diabetes mellitus   . GERD (gastroesophageal reflux disease)   . Hyperlipidemia   . Hypertension   . Pain in joint, ankle and foot 09/14/2013   Past Surgical History:  Procedure Laterality Date  . FRACTURE SURGERY Left     leg  . hiatal hernia surgery  2010  . ROTATOR CUFF REPAIR Right 2010  . TONSILLECTOMY      Current Outpatient Prescriptions  Medication Sig Dispense Refill  . Ascorbic Acid (VITAMIN C) 1000 MG tablet Take 1,000 mg by mouth 2 (two) times daily.    [name] aspirin EC 81 MG tablet Take 81 mg by mouth daily.    . Calcium Carb-Cholecalciferol (CALTRATE 600+D3 SOFT PO) Take 1 tablet by mouth 2 (two) times daily.    . cimetidine (TAGAMET) 200 MG tablet Take 200 mg by mouth daily.    [name] CRANBERRY PO Take 2 tablets by mouth daily.    . Cyanocobalamin (VITAMIN B-12) 1000 MCG SUBL Place 1 each under the tongue daily.    [name] diltiazem (CARDIZEM) 90 MG tablet Take 90 mg by mouth 2 (two) times daily.    . fish oil-omega-3 fatty acids 1000 MG capsule Take 2 g by mouth daily.    [name]  lisinopril-hydrochlorothiazide (PRINZIDE,ZESTORETIC) 20-12.5 MG tablet Take 1 tablet by mouth daily. 30 tablet 3  . metFORMIN (GLUCOPHAGE) 500 MG tablet Take 500 mg by mouth 2 (two) times daily with a meal.    . metoprolol tartrate (LOPRESSOR) 25 MG tablet Take 1 tablet (25 mg total) by mouth 2 (two) times daily. 60 tablet 6  . Probiotic Product (PROBIOTIC PO) Take 1 capsule by mouth daily.     . ranitidine (ZANTAC) 300 MG tablet Take 1 tablet (300 mg total) by mouth at bedtime. 30 tablet 3  . apixaban (ELIQUIS) 5 MG TABS tablet Take 1 tablet (5 mg total) by mouth 2 (two) times daily. (Patient not taking: Reported on 02/06/2017) 60 tablet 6  . fluticasone (FLONASE) 50 MCG/ACT nasal spray Place 2 sprays into both nostrils daily as needed for allergies or rhinitis.     No current facility-administered medications for this encounter.     Allergies  Allergen Reactions  . Niacin And Related Rash  . Keflex [Cephalexin] Other (See Comments)    Fatigue, sore throat, body aches  . Sausage [Pickled Meat] Other (See Comments)    Drowsiness and dizziness  . Tape Rash    Bandaids     Social History   Social History  . Marital status: Widowed    Spouse name: N/A  . Number of children: 3  .  Years of education: N/A   Occupational History  . Not on file.   Social History Main Topics  . Smoking status: Never Smoker  . Smokeless tobacco: Never Used  . Alcohol use No  . Drug use: No  . Sexual activity: Not on file   Other Topics Concern  . Not on file   Social History Narrative  . No narrative on file    Family History  Problem Relation Age of Onset  . Colon cancer Brother 35  . Colon cancer Maternal Aunt 25  . Breast cancer Paternal Grandmother   . Breast cancer Maternal Aunt   . Diabetes Maternal Aunt   . Heart disease Mother   . Stomach cancer Neg Hx     ROS- All systems are reviewed and negative except as per the HPI above  Physical Exam: Vitals:   02/06/17 1115  BP:  118/62  Pulse: (!) 53  Weight: 186 lb 9.6 oz (84.6 kg)  Height: 5\' 4"  (1.626 m)   Wt Readings from Last 3 Encounters:  02/06/17 186 lb 9.6 oz (84.6 kg)  01/29/17 183 lb (83 kg)  01/09/17 184 lb (83.5 kg)    Labs: Lab Results  Component Value Date   NA 139 12/29/2016   K 4.0 12/29/2016   CL 105 12/29/2016   CO2 24 12/29/2016   GLUCOSE 176 (H) 12/29/2016   BUN 12 12/29/2016   CREATININE 1.00 12/29/2016   CALCIUM 9.9 12/29/2016   No results found for: INR Lab Results  Component Value Date   CHOL 217 (H) 11/27/2016   HDL 43.60 11/27/2016   LDLCALC 121 04/24/2016   TRIG 231.0 (H) 11/27/2016     GEN- The patient is well appearing, alert and oriented x 3 today.   Head- normocephalic, atraumatic Eyes-  Sclera clear, conjunctiva pink Ears- hearing intact Oropharynx- clear Neck- supple, no JVP Lymph- no cervical lymphadenopathy Lungs- Clear to ausculation bilaterally, normal work of breathing Heart- Regular rate and rhythm, no murmurs, rubs or gallops, PMI not laterally displaced GI- soft, NT, ND, + BS Extremities- no clubbing, cyanosis, or edema MS- no significant deformity or atrophy Skin- no rash or lesion Psych- euthymic mood, full affect Neuro- strength and sensation are intact  EKG- Sinus brady at 53 bpm, pr int 176 ms, qrs int 74 ms, qtc 392 Epic records reviewed    Assessment and Plan: 1.New onset afib General education re afib discussed, triggers reviewed, booklet given Currently maintaining SR on metoprolol 25 mg bid Continue Cardizem 90 mg bid( pt has been on this for years) She will stop ASA and will start eliquis 5 mg bid Bleeding precautions reviewed She wishes to be referred to the Coumadin Clinic as she cannot afford eliquis long term Echo is pending   F/u in 2 weeks  [name] C. Katheryne Gorr, [facility] [address] [place], [place] [address] [phone]

## 2017-02-06 NOTE — Patient Instructions (Signed)
Coumadin clinic will be in touch with you to transition from eliquis to coumadin.

## 2017-02-13 ENCOUNTER — Other Ambulatory Visit: Payer: Self-pay

## 2017-02-13 ENCOUNTER — Ambulatory Visit (HOSPITAL_COMMUNITY): Payer: Medicare HMO | Attending: Cardiovascular Disease

## 2017-02-13 DIAGNOSIS — I4891 Unspecified atrial fibrillation: Secondary | ICD-10-CM

## 2017-02-20 ENCOUNTER — Ambulatory Visit (HOSPITAL_COMMUNITY)
Admission: RE | Admit: 2017-02-20 | Discharge: 2017-02-20 | Disposition: A | Discharge status: Home / Self Care | Payer: Medicare HMO | Source: Ambulatory Visit | Attending: Nurse Practitioner | Admitting: Nurse Practitioner

## 2017-02-20 ENCOUNTER — Other Ambulatory Visit: Payer: Self-pay

## 2017-02-20 ENCOUNTER — Encounter (HOSPITAL_COMMUNITY): Payer: Self-pay | Admitting: Nurse Practitioner

## 2017-02-20 VITALS — BP 106/64 | HR 75 | Ht 64.0 in | Wt 182.6 lb

## 2017-02-20 DIAGNOSIS — Z79899 Other long term (current) drug therapy: Secondary | ICD-10-CM | POA: Insufficient documentation

## 2017-02-20 DIAGNOSIS — E119 Type 2 diabetes mellitus without complications: Secondary | ICD-10-CM | POA: Diagnosis not present

## 2017-02-20 DIAGNOSIS — Z7984 Long term (current) use of oral hypoglycemic drugs: Secondary | ICD-10-CM | POA: Insufficient documentation

## 2017-02-20 DIAGNOSIS — Z9889 Other specified postprocedural states: Secondary | ICD-10-CM | POA: Diagnosis not present

## 2017-02-20 DIAGNOSIS — I480 Paroxysmal atrial fibrillation: Secondary | ICD-10-CM | POA: Diagnosis not present

## 2017-02-20 NOTE — Progress Notes (Signed)
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  Recently diagnosed with afib. She has been placed on metoprolol 25 mg bid and since this was started last week, she has not noticed any further afib. She has not started her eliquis. She knows that she cannot afford this long term. She has picked up from pharmacy but has just continued to take her ASA daily. She does not smoke or use alcohol. Denies a snoring history. Is active. Echo scheduled and is pending 2/23.  F/u 3/2 and she is in afib this am. She states that she thought she felt a few flutters this am but first she has felt. She is rate controlled and otherwise has not felt any change in her energy or breathing. She continues on apixaban, no bleeding issues. Echo reviewed with pt.  Today, she denies symptoms of palpitations, chest pain, shortness of breath, orthopnea, PND, lower extremity edema, dizziness, presyncope, syncope, or neurologic sequela. The patient is tolerating medications without difficulties and is otherwise without complaint today.   Past Medical History:  Diagnosis Date  . Allergic rhinitis   . Arthritis   . Diabetes mellitus   . GERD (gastroesophageal reflux disease)   . Hyperlipidemia   . Hypertension   . Pain in joint, ankle and foot 09/14/2013   Past Surgical History:  Procedure Laterality Date  . FRACTURE SURGERY Left     leg  . hiatal hernia surgery  2010  . ROTATOR CUFF REPAIR Right 2010  . TONSILLECTOMY      Current Outpatient Prescriptions  Medication Sig Dispense Refill  . apixaban (ELIQUIS) 5 MG TABS tablet Take 1 tablet (5 mg total) by mouth 2 (two) times daily. 180 tablet 1  . Ascorbic Acid (VITAMIN C) 1000 MG tablet Take 1,000 mg by mouth 2 (two) times daily.    . Calcium Carb-Cholecalciferol (CALTRATE 600+D3 SOFT PO) Take 1 tablet by mouth 2 (two) times daily.    . cimetidine (TAGAMET) 200 MG tablet Take  200 mg by mouth daily.    [name] CRANBERRY PO Take 2 tablets by mouth daily.    . Cyanocobalamin (VITAMIN B-12) 1000 MCG SUBL Place 1 each under the tongue daily.    [name] diltiazem (CARDIZEM) 90 MG tablet Take 90 mg by mouth 2 (two) times daily.    . fish oil-omega-3 fatty acids 1000 MG capsule Take 2 g by mouth daily.    . fluticasone (FLONASE) 50 MCG/ACT nasal spray Place 2 sprays into both nostrils daily as needed for allergies or rhinitis.    [name] lisinopril-hydrochlorothiazide (PRINZIDE,ZESTORETIC) 20-12.5 MG tablet Take 1 tablet by mouth daily. 30 tablet 3  . metFORMIN (GLUCOPHAGE) 500 MG tablet Take 500 mg by mouth 2 (two) times daily with a meal.    . metoprolol tartrate (LOPRESSOR) 25 MG tablet Take 1 tablet (25 mg total) by mouth 2 (two) times daily. 180 tablet 3  . Probiotic Product (PROBIOTIC PO) Take 1 capsule by mouth daily.     . ranitidine (ZANTAC) 300 MG tablet Take 1 tablet (300 mg total) by mouth at bedtime. 30 tablet 3   No current facility-administered medications for this encounter.     Allergies  Allergen Reactions  . Niacin And Related Rash  . Keflex [Cephalexin] Other (See Comments)    Fatigue, sore throat, body aches  . Sausage [Pickled Meat] Other (See Comments)    Drowsiness and dizziness  .  Tape Rash    Bandaids     Social History   Social History  . Marital status: Widowed    Spouse name: N/A  . Number of children: 3  . Years of education: N/A   Occupational History  . Not on file.   Social History Main Topics  . Smoking status: Never Smoker  . Smokeless tobacco: Never Used  . Alcohol use No  . Drug use: No  . Sexual activity: Not on file   Other Topics Concern  . Not on file   Social History Narrative  . No narrative on file    Family History  Problem Relation Age of Onset  . Colon cancer Brother 48  . Colon cancer Maternal Aunt 47  . Breast cancer Paternal Grandmother   . Breast cancer Maternal Aunt   . Diabetes Maternal Aunt   . Heart  disease Mother   . Stomach cancer Neg Hx     ROS- All systems are reviewed and negative except as per the HPI above  Physical Exam: Vitals:   02/20/17 1135  BP: 106/64  Pulse: 75  Weight: 182 lb 9.6 oz (82.8 kg)  Height: 5\' 4"  (1.626 m)   Wt Readings from Last 3 Encounters:  02/20/17 182 lb 9.6 oz (82.8 kg)  02/06/17 186 lb 9.6 oz (84.6 kg)  01/29/17 183 lb (83 kg)    Labs: Lab Results  Component Value Date   NA 139 12/29/2016   K 4.0 12/29/2016   CL 105 12/29/2016   CO2 24 12/29/2016   GLUCOSE 176 (H) 12/29/2016   BUN 12 12/29/2016   CREATININE 1.00 12/29/2016   CALCIUM 9.9 12/29/2016   No results found for: INR Lab Results  Component Value Date   CHOL 217 (H) 11/27/2016   HDL 43.60 11/27/2016   LDLCALC 121 04/24/2016   TRIG 231.0 (H) 11/27/2016     GEN- The patient is well appearing, alert and oriented x 3 today.   Head- normocephalic, atraumatic Eyes-  Sclera clear, conjunctiva pink Ears- hearing intact Oropharynx- clear Neck- supple, no JVP Lymph- no cervical lymphadenopathy Lungs- Clear to ausculation bilaterally, normal work of breathing Heart- irregular rate and rhythm, no murmurs, rubs or gallops, PMI not laterally displaced GI- soft, NT, ND, + BS Extremities- no clubbing, cyanosis, or edema MS- no significant deformity or atrophy Skin- no rash or lesion Psych- euthymic mood, full affect Neuro- strength and sensation are intact  EKG- afib at 75 bpm,qrs int 76 ms, qtc 406 ms Epic records reviewed  Echo-Study Conclusions  - Left ventricle: The cavity size was normal. Wall thickness was   normal. Systolic function was normal. The estimated ejection   fraction was in the range of 55% to 60%. Wall motion was normal;   there were no regional wall motion abnormalities. Doppler   parameters are consistent with abnormal left ventricular   relaxation (grade 1 diastolic dysfunction). - Mitral valve: Calcified annulus. Mildly thickened leaflets . -  Left atrium: The atrium was moderately dilated.  Impressions:  - Normal LV systolic function; grade 1 diastolic dysfunction;   moderate LAE; mild TR.   Assessment and Plan: 1 Paroxsymal afib In rate controlled afib this am but has not really noticed any palpitations before this am Continue on metoprolol 25 mg bid Continue Cardizem 90 mg bid( pt has been on this for years) Continue eliquis 5 mg bid, she has decided she will stay on this for now instead of staring on coumadin  Will  f/u in one month at which time I will review with her afib burden, symptoms and any need to pursue rhythm control.   Geroge Baseman [facility] [address] [place], [place] [address] [place] Ornella Coderre, [facility] [address] [place], [place] [address] [phone]

## 2017-02-26 ENCOUNTER — Encounter: Payer: Self-pay | Admitting: Family Medicine

## 2017-02-26 ENCOUNTER — Ambulatory Visit (INDEPENDENT_AMBULATORY_CARE_PROVIDER_SITE_OTHER): Payer: Medicare HMO | Admitting: Family Medicine

## 2017-02-26 VITALS — BP 112/78 | HR 55 | Temp 98.4°F | Resp 16 | Ht 64.0 in | Wt 182.1 lb

## 2017-02-26 DIAGNOSIS — E1142 Type 2 diabetes mellitus with diabetic polyneuropathy: Secondary | ICD-10-CM | POA: Diagnosis not present

## 2017-02-26 LAB — HEMOGLOBIN A1C: HEMOGLOBIN A1C: 7 % — AB (ref 4.6–6.5)

## 2017-02-26 LAB — BASIC METABOLIC PANEL
BUN: 21 mg/dL (ref 6–23)
CO2: 29 mEq/L (ref 19–32)
CREATININE: 1.09 mg/dL (ref 0.40–1.20)
Calcium: 9.8 mg/dL (ref 8.4–10.5)
Chloride: 104 mEq/L (ref 96–112)
GFR: 51.92 mL/min — AB (ref >60.00)
Glucose, Bld: 175 mg/dL — ABNORMAL HIGH (ref 70–99)
Potassium: 4.9 mEq/L (ref 3.5–5.1)
Sodium: 139 mEq/L (ref 135–145)

## 2017-02-26 NOTE — Progress Notes (Signed)
   Subjective:    Patient ID: Melanie [name], female    DOB: 09-22-1941, 76 y.o.   MRN: [id number]  HPI DM- chronic problem, on Metformin 500mg  BID.  UTD on foot exam, eye exam.  On ACE for renal protection.  Denies CP, SOB, HAs, visual changes, edema.  No abd pain, N/V.  Chronic numbness/tingling of hands/feet.  + fatigue.  No symptomatic lows.   Review of Systems For ROS see HPI     Objective:   Physical Exam  Constitutional: She is oriented to person, place, and time. She appears well-developed and well-nourished. No distress.  HENT:  Head: Normocephalic and atraumatic.  Eyes: Conjunctivae and EOM are normal. Pupils are equal, round, and reactive to light.  Neck: Normal range of motion. Neck supple. No thyromegaly present.  Cardiovascular: Normal rate, regular rhythm, normal heart sounds and intact distal pulses.   No murmur heard. Pulmonary/Chest: Effort normal and breath sounds normal. No respiratory distress.  Abdominal: Soft. She exhibits no distension. There is no tenderness.  Musculoskeletal: She exhibits no edema.  Lymphadenopathy:    She has no cervical adenopathy.  Neurological: She is alert and oriented to person, place, and time.  Skin: Skin is warm and dry.  Psychiatric: She has a normal mood and affect. Her behavior is normal.  Vitals reviewed.         Assessment & Plan:

## 2017-02-26 NOTE — Patient Instructions (Signed)
Schedule your complete physical w/ me and your Annual Wellness Visit w/ Maudie Mercury (our Health Coach) in 3-4 months We'll notify you of your lab results and make any changes if needed Continue to work on healthy diet and regular exercise- you look good! Call with any questions or concerns Happy Spring!!!!

## 2017-02-26 NOTE — Progress Notes (Signed)
Pre visit review using our clinic review tool, if applicable. No additional management support is needed unless otherwise documented below in the visit note. 

## 2017-02-26 NOTE — Assessment & Plan Note (Signed)
Chronic problem.  Currently well controlled on Metformin.  UTD on foot exam, eye exam.  On ACE for renal protection.  Stressed need for healthy diet and regular exercise.  Check labs.  Adjust meds prn

## 2017-02-27 ENCOUNTER — Encounter: Payer: Self-pay | Admitting: General Practice

## 2017-03-13 ENCOUNTER — Ambulatory Visit: Payer: Medicare HMO | Admitting: Dietician

## 2017-03-13 DIAGNOSIS — N390 Urinary tract infection, site not specified: Secondary | ICD-10-CM | POA: Diagnosis not present

## 2017-03-26 ENCOUNTER — Encounter (HOSPITAL_COMMUNITY): Payer: Self-pay | Admitting: Nurse Practitioner

## 2017-03-26 ENCOUNTER — Ambulatory Visit (HOSPITAL_COMMUNITY)
Admission: RE | Admit: 2017-03-26 | Discharge: 2017-03-26 | Disposition: A | Discharge status: Home / Self Care | Payer: Medicare HMO | Source: Ambulatory Visit | Attending: Nurse Practitioner | Admitting: Nurse Practitioner

## 2017-03-26 VITALS — BP 132/86 | HR 50 | Ht 64.0 in | Wt 184.2 lb

## 2017-03-26 DIAGNOSIS — E119 Type 2 diabetes mellitus without complications: Secondary | ICD-10-CM | POA: Diagnosis not present

## 2017-03-26 DIAGNOSIS — R001 Bradycardia, unspecified: Secondary | ICD-10-CM | POA: Diagnosis not present

## 2017-03-26 DIAGNOSIS — Z79899 Other long term (current) drug therapy: Secondary | ICD-10-CM | POA: Insufficient documentation

## 2017-03-26 DIAGNOSIS — I10 Essential (primary) hypertension: Secondary | ICD-10-CM | POA: Diagnosis not present

## 2017-03-26 DIAGNOSIS — I480 Paroxysmal atrial fibrillation: Secondary | ICD-10-CM

## 2017-03-26 DIAGNOSIS — I4891 Unspecified atrial fibrillation: Secondary | ICD-10-CM | POA: Diagnosis present

## 2017-03-26 DIAGNOSIS — Z7901 Long term (current) use of anticoagulants: Secondary | ICD-10-CM | POA: Diagnosis not present

## 2017-03-26 DIAGNOSIS — Z7984 Long term (current) use of oral hypoglycemic drugs: Secondary | ICD-10-CM | POA: Diagnosis not present

## 2017-03-26 NOTE — Progress Notes (Signed)
Primary Care Physician: Melanie Asa, MD Referring Physician: [name] is a 76 y.o. female with h/o DM, HTN.  Recently diagnosed with afib. She has been placed on metoprolol 25 mg bid and since this was started last week, she has not noticed any further afib. She has not started her eliquis. She knows that she cannot afford this long term. She has picked up from pharmacy but has just continued to take her [name] daily. She does not smoke or use alcohol. Denies a snoring history. Is active. Echo scheduled and is pending 2/23.  F/u 3/2 and she is in afib this am. She states that she thought she felt a few flutters this am but first she has felt. She is rate controlled and otherwise has not felt any change in her energy or breathing. She continues on apixaban, no bleeding issues. Echo reviewed with pt.  Pt has f/u in the afib clinic 4/6 and feels well. Has had no afib that she is aware of. She is being compliant with her apixaban.  Today, she denies symptoms of palpitations, chest pain, shortness of breath, orthopnea, PND, lower extremity edema, dizziness, presyncope, syncope, or neurologic sequela. The patient is tolerating medications without difficulties and is otherwise without complaint today.   Past Medical History:  Diagnosis Date  . Allergic rhinitis   . Arthritis   . Diabetes mellitus   . GERD (gastroesophageal reflux disease)   . Hyperlipidemia   . Hypertension   . Pain in joint, ankle and foot 09/14/2013   Past Surgical History:  Procedure Laterality Date  . FRACTURE SURGERY Left     leg  . hiatal hernia surgery  2010  . ROTATOR CUFF REPAIR Right 2010  . TONSILLECTOMY      Current Outpatient Prescriptions  Medication Sig Dispense Refill  . apixaban (ELIQUIS) 5 MG TABS tablet Take 1 tablet (5 mg total) by mouth 2 (two) times daily. 180 tablet 1  . Ascorbic Acid (VITAMIN C) 1000 MG tablet Take 1,000 mg by mouth 2 (two) times daily.    . Calcium  Carb-Cholecalciferol (CALTRATE 600+D3 SOFT PO) Take 1 tablet by mouth 2 (two) times daily.    . cimetidine (TAGAMET) 200 MG tablet Take 200 mg by mouth daily.    [name] CRANBERRY PO Take 2 tablets by mouth daily.    . Cyanocobalamin (VITAMIN B-12) 1000 MCG SUBL Place 1 each under the tongue daily.    [name] diltiazem (CARDIZEM) 90 MG tablet Take 90 mg by mouth 2 (two) times daily.    . fish oil-omega-3 fatty acids 1000 MG capsule Take 2 g by mouth daily.    . fluticasone (FLONASE) 50 MCG/ACT nasal spray Place 2 sprays into both nostrils daily as needed for allergies or rhinitis.    [name] lisinopril-hydrochlorothiazide (PRINZIDE,ZESTORETIC) 20-12.5 MG tablet Take 1 tablet by mouth daily. 30 tablet 3  . metFORMIN (GLUCOPHAGE) 500 MG tablet Take 500 mg by mouth 2 (two) times daily with a meal.    . metoprolol tartrate (LOPRESSOR) 25 MG tablet Take 1 tablet (25 mg total) by mouth 2 (two) times daily. 180 tablet 3  . Probiotic Product (PROBIOTIC PO) Take 1 capsule by mouth daily.     . ranitidine (ZANTAC) 300 MG tablet Take 1 tablet (300 mg total) by mouth at bedtime. 30 tablet 3   No current facility-administered medications for this encounter.     Allergies  Allergen Reactions  . Niacin And Related Rash  .  Keflex [Cephalexin] Other (See Comments)    Fatigue, sore throat, body aches  . Sausage [Pickled Meat] Other (See Comments)    Drowsiness and dizziness  . Tape Rash    Bandaids     Social History   Social History  . Marital status: Widowed    Spouse name: N/A  . Number of children: 3  . Years of education: N/A   Occupational History  . Not on file.   Social History Main Topics  . Smoking status: Never Smoker  . Smokeless tobacco: Never Used  . Alcohol use No  . Drug use: No  . Sexual activity: Not on file   Other Topics Concern  . Not on file   Social History Narrative  . No narrative on file    Family History  Problem Relation Age of Onset  . Colon cancer Brother 77  . Colon  cancer Maternal Aunt 79  . Breast cancer Paternal Grandmother   . Breast cancer Maternal Aunt   . Diabetes Maternal Aunt   . Heart disease Mother   . Stomach cancer Neg Hx     ROS- All systems are reviewed and negative except as per the HPI above  Physical Exam: Vitals:   03/26/17 1108  BP: 132/86  Pulse: (!) 50  Weight: 184 lb 3.2 oz (83.6 kg)  Height: 5\' 4"  (1.626 m)   Wt Readings from Last 3 Encounters:  03/26/17 184 lb 3.2 oz (83.6 kg)  02/26/17 182 lb 2 oz (82.6 kg)  02/20/17 182 lb 9.6 oz (82.8 kg)    Labs: Lab Results  Component Value Date   NA 139 02/26/2017   K 4.9 02/26/2017   CL 104 02/26/2017   CO2 29 02/26/2017   GLUCOSE 175 (H) 02/26/2017   BUN 21 02/26/2017   CREATININE 1.09 02/26/2017   CALCIUM 9.8 02/26/2017   No results found for: INR Lab Results  Component Value Date   CHOL 217 (H) 11/27/2016   HDL 43.60 11/27/2016   LDLCALC 121 04/24/2016   TRIG 231.0 (H) 11/27/2016     GEN- The patient is well appearing, alert and oriented x 3 today.   Head- normocephalic, atraumatic Eyes-  Sclera clear, conjunctiva pink Ears- hearing intact Oropharynx- clear Neck- supple, no JVP Lymph- no cervical lymphadenopathy Lungs- Clear to ausculation bilaterally, normal work of breathing Heart- irregular rate and rhythm, no murmurs, rubs or gallops, PMI not laterally displaced GI- soft, NT, ND, + BS Extremities- no clubbing, cyanosis, or edema MS- no significant deformity or atrophy Skin- no rash or lesion Psych- euthymic mood, full affect Neuro- strength and sensation are intact  EKG- afib at 75 bpm,qrs int 76 ms, qtc 406 ms Epic records reviewed  Echo-Study Conclusions  - Left ventricle: The cavity size was normal. Wall thickness was   normal. Systolic function was normal. The estimated ejection   fraction was in the range of 55% to 60%. Wall motion was normal;   there were no regional wall motion abnormalities. Doppler   parameters are consistent  with abnormal left ventricular   relaxation (grade 1 diastolic dysfunction). - Mitral valve: Calcified annulus. Mildly thickened leaflets . - Left atrium: The atrium was moderately dilated.  Impressions:  - Normal LV systolic function; grade 1 diastolic dysfunction;   moderate LAE; mild TR.   Assessment and Plan: 1 Paroxsymal afib Staying in SR, no afib to report Continue on metoprolol 25 mg bid Continue Cardizem 90 mg bid( pt has been on this for  years) Continue eliquis 5 mg bid   f/u in 3 months   [name] C. [facility] [address] [place], [place] [address] [place] Victorya Hillman, [facility] [address] [place], [place] [address] [phone]

## 2017-05-06 ENCOUNTER — Encounter: Payer: Self-pay | Admitting: Family Medicine

## 2017-05-06 ENCOUNTER — Ambulatory Visit (INDEPENDENT_AMBULATORY_CARE_PROVIDER_SITE_OTHER): Payer: Medicare HMO | Admitting: Family Medicine

## 2017-05-06 VITALS — BP 130/80 | HR 59 | Temp 97.9°F | Resp 16 | Ht 64.0 in | Wt 185.5 lb

## 2017-05-06 DIAGNOSIS — R600 Localized edema: Secondary | ICD-10-CM | POA: Diagnosis not present

## 2017-05-06 DIAGNOSIS — R21 Rash and other nonspecific skin eruption: Secondary | ICD-10-CM | POA: Diagnosis not present

## 2017-05-06 DIAGNOSIS — R82998 Other abnormal findings in urine: Secondary | ICD-10-CM

## 2017-05-06 DIAGNOSIS — R8299 Other abnormal findings in urine: Secondary | ICD-10-CM | POA: Diagnosis not present

## 2017-05-06 DIAGNOSIS — R350 Frequency of micturition: Secondary | ICD-10-CM

## 2017-05-06 LAB — BASIC METABOLIC PANEL
BUN: 22 mg/dL (ref 6–23)
CO2: 28 mEq/L (ref 19–32)
CREATININE: 1.04 mg/dL (ref 0.40–1.20)
Calcium: 9.3 mg/dL (ref 8.4–10.5)
Chloride: 103 mEq/L (ref 96–112)
GFR: 54.79 mL/min — ABNORMAL LOW (ref >60.00)
Glucose, Bld: 167 mg/dL — ABNORMAL HIGH (ref 70–99)
POTASSIUM: 4 meq/L (ref 3.5–5.1)
SODIUM: 139 meq/L (ref 135–145)

## 2017-05-06 LAB — CBC WITH DIFFERENTIAL/PLATELET
BASOS PCT: 0.5 % (ref 0.0–3.0)
Basophils Absolute: 0 10*3/uL (ref 0.0–0.1)
EOS ABS: 0.2 10*3/uL (ref 0.0–0.7)
Eosinophils Relative: 2.6 % (ref 0.0–5.0)
HCT: 40.1 % (ref 36.0–46.0)
HEMOGLOBIN: 13.5 g/dL (ref 12.0–15.0)
Lymphocytes Relative: 21.6 % (ref 12.0–46.0)
Lymphs Abs: 1.5 10*3/uL (ref 0.7–4.0)
MCHC: 33.6 g/dL (ref 30.0–36.0)
MCV: 91.1 fl (ref 78.0–100.0)
MONO ABS: 0.5 10*3/uL (ref 0.1–1.0)
Monocytes Relative: 7.3 % (ref 3.0–12.0)
Neutro Abs: 4.6 10*3/uL (ref 1.4–7.7)
Neutrophils Relative %: 68 % (ref 43.0–77.0)
Platelets: 204 10*3/uL (ref 150.0–400.0)
RBC: 4.4 Mil/uL (ref 3.87–5.11)
RDW: 13.3 % (ref 11.5–15.5)
WBC: 6.8 10*3/uL (ref 4.0–10.5)

## 2017-05-06 LAB — POCT URINALYSIS DIPSTICK
Bilirubin, UA: NEGATIVE
Glucose, UA: NEGATIVE
Ketones, UA: NEGATIVE
NITRITE UA: NEGATIVE
PH UA: 5 (ref 5.0–8.0)
PROTEIN UA: NEGATIVE
RBC UA: NEGATIVE
SPEC GRAV UA: 1.015 (ref 1.010–1.025)
UROBILINOGEN UA: 0.2 U/dL

## 2017-05-06 LAB — TSH: TSH: 1.97 u[IU]/mL (ref 0.35–4.50)

## 2017-05-06 LAB — HEPATIC FUNCTION PANEL
ALBUMIN: 4.4 g/dL (ref 3.5–5.2)
ALT: 10 U/L (ref 0–35)
AST: 11 U/L (ref 0–37)
Alkaline Phosphatase: 51 U/L (ref 39–117)
BILIRUBIN TOTAL: 0.8 mg/dL (ref 0.2–1.2)
Bilirubin, Direct: 0.1 mg/dL (ref 0.0–0.3)
TOTAL PROTEIN: 6.5 g/dL (ref 6.0–8.3)

## 2017-05-06 MED ORDER — FUROSEMIDE 20 MG PO TABS: 20.0000 mg | ORAL_TABLET | Freq: Every day | ORAL | 3 refills | Status: DC

## 2017-05-06 MED ORDER — CLOTRIMAZOLE-BETAMETHASONE 1-0.05 % EX CREA: 1.0000 "application " | TOPICAL_CREAM | Freq: Two times a day (BID) | CUTANEOUS | 0 refills | Status: DC

## 2017-05-06 NOTE — Progress Notes (Signed)
   Subjective:    Patient ID: Melanie [name], female    DOB: 04-18-1941, 76 y.o.   MRN: [id number]  HPI Edema- sxs started ~2 days ago on dorsum of feet bilaterally.  Denies itching but pt has neuropathy- 'i can't feel a mosquito when they bite me'.  Pt has been outside quite a bit recently and developed red rash at the same time as the swelling.  No CP, SOB.  R leg more swollen than L but this is chronic since R ankle surgery    Urinary frequency- pt reports she felt the need to urinate most of the night but when she would get up, very little came out.  Hx of UTI.   Review of Systems For ROS see HPI     Objective:   Physical Exam  Constitutional: She is oriented to person, place, and time. She appears well-developed and well-nourished. No distress.  obese  HENT:  Head: Normocephalic and atraumatic.  Eyes: Conjunctivae and EOM are normal. Pupils are equal, round, and reactive to light.  Neck: Normal range of motion. Neck supple. No thyromegaly present.  Cardiovascular: Normal rate, regular rhythm, normal heart sounds and intact distal pulses.   No murmur heard. Pulmonary/Chest: Effort normal and breath sounds normal. No respiratory distress.  Abdominal: Soft. She exhibits no distension. There is no tenderness.  Musculoskeletal: She exhibits edema (1+ pitting edema of R LE, no swelling noted on L, no overlying redness or TTP).  Lymphadenopathy:    She has no cervical adenopathy.  Neurological: She is alert and oriented to person, place, and time.  Skin: Skin is warm and dry. Rash (erythematous rash on dorsum of both feet consistent w/ either contact dermatitis vs fungal etiology) noted.  Psychiatric: She has a normal mood and affect. Her behavior is normal.  Vitals reviewed.         Assessment & Plan:  Edema- new.  Pt reports sxs started ~2 days ago.  Hx of chronic swelling of R leg since ankle surgery.  No CP, SOB to suggest systemic volume overload.  Pt reports she had  appearance of rash on feet at same time as swelling.  She is concerned about poison ivy b/c she cannot feel the itching due to her neuropathy.  Rash is consistent w/ contact vs fungal dermatitis. Start Lotrisone. Check labs to r/o metabolic causes of swelling.  Start Lasix PRN.  Reviewed need for low Na diet, increased water, and elevation of legs.  Urine frequency- await cx results prior to starting abx.  + leuks on UA.  Will follow closely.

## 2017-05-06 NOTE — Patient Instructions (Signed)
Reschedule your July appt for when you are back in town [place] notify you of your lab results and make any changes if needed Limit your salt intake and increase your water intake- this will help w/ swelling Elevate your legs when sitting Try and get regular exercise- this will help w/ swelling Use the Lotrisone cream twice daily on the rash on your feet Use the Furosemide as needed for swelling Call with any questions or concerns Have a safe trip to [place]!!

## 2017-05-06 NOTE — Addendum Note (Signed)
Addended by: Katina Dung on: 05/06/2017 09:30 AM   Modules accepted: Orders

## 2017-05-06 NOTE — Progress Notes (Signed)
Pre visit review using our clinic review tool, if applicable. No additional management support is needed unless otherwise documented below in the visit note. 

## 2017-05-07 LAB — URINE CULTURE

## 2017-05-10 ENCOUNTER — Other Ambulatory Visit: Payer: Self-pay | Admitting: Family Medicine

## 2017-06-08 DIAGNOSIS — N390 Urinary tract infection, site not specified: Secondary | ICD-10-CM | POA: Diagnosis not present

## 2017-06-16 ENCOUNTER — Other Ambulatory Visit: Payer: Self-pay | Admitting: Family Medicine

## 2017-06-17 ENCOUNTER — Other Ambulatory Visit: Payer: Self-pay | Admitting: General Practice

## 2017-06-17 MED ORDER — LISINOPRIL-HYDROCHLOROTHIAZIDE 20-12.5 MG PO TABS: 1.0000 | ORAL_TABLET | Freq: Every day | ORAL | 0 refills | Status: DC

## 2017-06-25 ENCOUNTER — Encounter: Payer: Medicare HMO | Admitting: Family Medicine

## 2017-06-29 ENCOUNTER — Inpatient Hospital Stay (HOSPITAL_COMMUNITY): Admission: RE | Admit: 2017-06-29 | Payer: Medicare HMO | Source: Ambulatory Visit | Admitting: Nurse Practitioner

## 2017-09-16 ENCOUNTER — Encounter: Payer: Self-pay | Admitting: Family Medicine

## 2017-09-16 ENCOUNTER — Other Ambulatory Visit: Payer: Self-pay | Admitting: Family Medicine

## 2017-09-16 ENCOUNTER — Ambulatory Visit (INDEPENDENT_AMBULATORY_CARE_PROVIDER_SITE_OTHER): Payer: Medicare HMO | Admitting: Family Medicine

## 2017-09-16 VITALS — BP 120/81 | HR 54 | Temp 98.1°F | Resp 16 | Ht 64.0 in | Wt 192.2 lb

## 2017-09-16 DIAGNOSIS — I10 Essential (primary) hypertension: Secondary | ICD-10-CM | POA: Diagnosis not present

## 2017-09-16 DIAGNOSIS — E1142 Type 2 diabetes mellitus with diabetic polyneuropathy: Secondary | ICD-10-CM | POA: Diagnosis not present

## 2017-09-16 DIAGNOSIS — Z23 Encounter for immunization: Secondary | ICD-10-CM | POA: Diagnosis not present

## 2017-09-16 DIAGNOSIS — E785 Hyperlipidemia, unspecified: Secondary | ICD-10-CM | POA: Diagnosis not present

## 2017-09-16 LAB — LIPID PANEL
Cholesterol: 213 mg/dL — ABNORMAL HIGH (ref 0–200)
HDL: 36.5 mg/dL — ABNORMAL LOW
LDL Cholesterol: 144 mg/dL — ABNORMAL HIGH (ref 0–99)
NonHDL: 176.32
Total CHOL/HDL Ratio: 6
Triglycerides: 161 mg/dL — ABNORMAL HIGH (ref 0.0–149.0)
VLDL: 32.2 mg/dL (ref 0.0–40.0)

## 2017-09-16 LAB — CBC WITH DIFFERENTIAL/PLATELET
BASOS ABS: 0.1 10*3/uL (ref 0.0–0.1)
BASOS PCT: 1.1 % (ref 0.0–3.0)
EOS ABS: 0.2 10*3/uL (ref 0.0–0.7)
Eosinophils Relative: 3 % (ref 0.0–5.0)
HEMATOCRIT: 39.1 % (ref 36.0–46.0)
Hemoglobin: 13.1 g/dL (ref 12.0–15.0)
LYMPHS PCT: 30.1 % (ref 12.0–46.0)
Lymphs Abs: 2.1 10*3/uL (ref 0.7–4.0)
MCHC: 33.4 g/dL (ref 30.0–36.0)
MCV: 91.3 fl (ref 78.0–100.0)
MONO ABS: 0.6 10*3/uL (ref 0.1–1.0)
Monocytes Relative: 8.4 % (ref 3.0–12.0)
NEUTROS ABS: 4 10*3/uL (ref 1.4–7.7)
Neutrophils Relative %: 57.4 % (ref 43.0–77.0)
PLATELETS: 247 10*3/uL (ref 150.0–400.0)
RBC: 4.28 Mil/uL (ref 3.87–5.11)
RDW: 12.5 % (ref 11.5–15.5)
WBC: 6.9 10*3/uL (ref 4.0–10.5)

## 2017-09-16 LAB — HEPATIC FUNCTION PANEL
ALBUMIN: 4.2 g/dL (ref 3.5–5.2)
ALK PHOS: 51 U/L (ref 39–117)
ALT: 8 U/L (ref 0–35)
AST: 10 U/L (ref 0–37)
BILIRUBIN DIRECT: 0.1 mg/dL (ref 0.0–0.3)
TOTAL PROTEIN: 6.6 g/dL (ref 6.0–8.3)
Total Bilirubin: 0.6 mg/dL (ref 0.2–1.2)

## 2017-09-16 LAB — BASIC METABOLIC PANEL WITH GFR
BUN: 14 mg/dL (ref 6–23)
CO2: 30 meq/L (ref 19–32)
Calcium: 9.4 mg/dL (ref 8.4–10.5)
Chloride: 103 meq/L (ref 96–112)
Creatinine, Ser: 0.94 mg/dL (ref 0.40–1.20)
GFR: 61.51 mL/min
Glucose, Bld: 118 mg/dL — ABNORMAL HIGH (ref 70–99)
Potassium: 4.1 meq/L (ref 3.5–5.1)
Sodium: 139 meq/L (ref 135–145)

## 2017-09-16 LAB — HEMOGLOBIN A1C: Hgb A1c MFr Bld: 7.2 % — ABNORMAL HIGH (ref 4.6–6.5)

## 2017-09-16 LAB — TSH: TSH: 1.79 u[IU]/mL (ref 0.35–4.50)

## 2017-09-16 NOTE — Progress Notes (Signed)
   Subjective:    Patient ID: Melanie [name], female    DOB: December 11, 1941, 76 y.o.   MRN: [id number]  HPI HTN- chronic problem, on Metoprolol 25mg  BID, Lisinopril HCTZ 20/12.5mg , Lasix 20mg  daily, Diltiazem 90mg  daily w/ good control.  Denies CP, SOB, HAs, visual changes, edema  DM- chronic problem, on Metformin twice daily.  On ACE for renal protection.  UTD on eye exam, foot exam.  Pt has gained 6 lbs since last visit despite restarting water aerobics 2-3x/week.  Denies symptomatic lows.  Chronic neuropathy- unchanged.  Hyperlipidemia- chronic problem, not currently on statin although unclear why as pt's LDL was 145 and we started Lipitor at that time.  Pt indicates 'I didn't like it'.  When asked why, she states, 'i just don't like cholesterol medication'.  No abd pain, N/V.   Review of Systems For ROS see HPI     Objective:   Physical Exam  Constitutional: She is oriented to person, place, and time. She appears well-developed and well-nourished. No distress.  HENT:  Head: Normocephalic and atraumatic.  Eyes: Pupils are equal, round, and reactive to light. Conjunctivae and EOM are normal.  Neck: Normal range of motion. Neck supple. No thyromegaly present.  Cardiovascular: Normal rate, regular rhythm, normal heart sounds and intact distal pulses.   No murmur heard. Pulmonary/Chest: Effort normal and breath sounds normal. No respiratory distress.  Abdominal: Soft. She exhibits no distension. There is no tenderness.  Musculoskeletal: She exhibits no edema.  Lymphadenopathy:    She has no cervical adenopathy.  Neurological: She is alert and oriented to person, place, and time.  Skin: Skin is warm and dry.  Psychiatric: She has a normal mood and affect. Her behavior is normal.  Vitals reviewed.         Assessment & Plan:

## 2017-09-16 NOTE — Patient Instructions (Signed)
Schedule your complete physical in 3-4 months and your Medicare Wellness Visit at the same time [facility] notify you of your lab results and make any changes if needed Continue to work on healthy diet and regular exercise- you can do it! We may have to reconsider cholesterol medication depending on your labs Make sure you schedule your eye exam next month Call with any questions or concerns Happy Fall!!!

## 2017-09-16 NOTE — Progress Notes (Signed)
Pre visit review using our clinic review tool, if applicable. No additional management support is needed unless otherwise documented below in the visit note. 

## 2017-09-16 NOTE — Assessment & Plan Note (Signed)
Chronic problem.  Currently on Metformin w/ hx of adequate control.  UTD on eye exam, foot exam.  On ACE for renal protection.  Stressed need for healthy diet and regular exercise.  Check labs.  Adjust meds prn

## 2017-09-16 NOTE — Assessment & Plan Note (Signed)
Chronic problem.  Pt is supposed to be taking Lipitor but she stopped this w/o explanation other than, 'I don't like cholesterol medication'.  Check labs.  Discussed that we may need to revisit tx.  Will follow.

## 2017-09-16 NOTE — Assessment & Plan Note (Signed)
Chronic problem, well controlled today.  Asymptomatic.  Check labs.  No anticipated med changes. 

## 2017-09-17 ENCOUNTER — Other Ambulatory Visit: Payer: Self-pay | Admitting: Family Medicine

## 2017-09-17 DIAGNOSIS — E1142 Type 2 diabetes mellitus with diabetic polyneuropathy: Secondary | ICD-10-CM

## 2017-09-21 ENCOUNTER — Other Ambulatory Visit: Payer: Self-pay | Admitting: General Practice

## 2017-09-21 MED ORDER — EZETIMIBE 10 MG PO TABS: 10.0000 mg | ORAL_TABLET | Freq: Every day | ORAL | 3 refills | Status: DC

## 2017-10-26 DIAGNOSIS — N3090 Cystitis, unspecified without hematuria: Secondary | ICD-10-CM | POA: Diagnosis not present

## 2017-10-26 DIAGNOSIS — N3001 Acute cystitis with hematuria: Secondary | ICD-10-CM | POA: Diagnosis not present

## 2017-10-28 ENCOUNTER — Ambulatory Visit (HOSPITAL_COMMUNITY)
Admission: RE | Admit: 2017-10-28 | Discharge: 2017-10-28 | Disposition: A | Discharge status: Home / Self Care | Payer: Medicare HMO | Source: Ambulatory Visit | Attending: Nurse Practitioner | Admitting: Nurse Practitioner

## 2017-10-28 ENCOUNTER — Encounter (HOSPITAL_COMMUNITY): Payer: Self-pay | Admitting: Nurse Practitioner

## 2017-10-28 VITALS — BP 124/66 | HR 59 | Ht 64.0 in | Wt 189.8 lb

## 2017-10-28 DIAGNOSIS — Z8249 Family history of ischemic heart disease and other diseases of the circulatory system: Secondary | ICD-10-CM | POA: Insufficient documentation

## 2017-10-28 DIAGNOSIS — Z79899 Other long term (current) drug therapy: Secondary | ICD-10-CM | POA: Insufficient documentation

## 2017-10-28 DIAGNOSIS — K219 Gastro-esophageal reflux disease without esophagitis: Secondary | ICD-10-CM | POA: Insufficient documentation

## 2017-10-28 DIAGNOSIS — Z91018 Allergy to other foods: Secondary | ICD-10-CM | POA: Insufficient documentation

## 2017-10-28 DIAGNOSIS — Z888 Allergy status to other drugs, medicaments and biological substances status: Secondary | ICD-10-CM | POA: Insufficient documentation

## 2017-10-28 DIAGNOSIS — E119 Type 2 diabetes mellitus without complications: Secondary | ICD-10-CM | POA: Diagnosis not present

## 2017-10-28 DIAGNOSIS — Z800 Family history of malignant neoplasm of digestive organs: Secondary | ICD-10-CM | POA: Diagnosis not present

## 2017-10-28 DIAGNOSIS — I10 Essential (primary) hypertension: Secondary | ICD-10-CM | POA: Diagnosis not present

## 2017-10-28 DIAGNOSIS — Z803 Family history of malignant neoplasm of breast: Secondary | ICD-10-CM | POA: Insufficient documentation

## 2017-10-28 DIAGNOSIS — Z7984 Long term (current) use of oral hypoglycemic drugs: Secondary | ICD-10-CM | POA: Diagnosis not present

## 2017-10-28 DIAGNOSIS — I480 Paroxysmal atrial fibrillation: Secondary | ICD-10-CM | POA: Diagnosis not present

## 2017-10-28 DIAGNOSIS — Z9889 Other specified postprocedural states: Secondary | ICD-10-CM | POA: Insufficient documentation

## 2017-10-28 DIAGNOSIS — E785 Hyperlipidemia, unspecified: Secondary | ICD-10-CM | POA: Diagnosis not present

## 2017-10-28 DIAGNOSIS — Z91048 Other nonmedicinal substance allergy status: Secondary | ICD-10-CM | POA: Diagnosis not present

## 2017-10-28 DIAGNOSIS — Z833 Family history of diabetes mellitus: Secondary | ICD-10-CM | POA: Diagnosis not present

## 2017-10-28 NOTE — Progress Notes (Signed)
Primary Care Physician: [name], MD Referring Physician: [name] is a 76 y.o. female with h/o DM, HTN. Diagnosed with afib early 2018.Melanie [name] She has been placed on metoprolol 25 mg bid and since this was started last week, she has not noticed any further afib. She has not started her eliquis. She knows that she cannot afford this long term. She has picked up from pharmacy but has just continued to take her ASA daily. She does not smoke or use alcohol. Denies a snoring history. Is active. Echo scheduled and is pending 2/23.  F/u 3/2 and she is in afib this am. She states that she thought she felt a few flutters this am but first she has felt. She is rate controlled and otherwise has not felt any change in her energy or breathing. She continues on apixaban, no bleeding issues. Echo reviewed with pt.  Pt has f/u in the afib clinic 4/6 and feels well. Has had no afib that she is aware of. She is being compliant with her apixaban.  F/u in fib clinic, 10/27/17. She has felt well and denies any afib at all. She cannot afford eliquis any longer as she had a small part time job but this has dried up.   Today, she denies symptoms of palpitations, chest pain, shortness of breath, orthopnea, PND, lower extremity edema, dizziness, presyncope, syncope, or neurologic sequela. The patient is tolerating medications without difficulties and is otherwise without complaint today.   Past Medical History:  Diagnosis Date  . Allergic rhinitis   . Arthritis   . Diabetes mellitus   . GERD (gastroesophageal reflux disease)   . Hyperlipidemia   . Hypertension   . Pain in joint, ankle and foot 09/14/2013   Past Surgical History:  Procedure Laterality Date  . FRACTURE SURGERY Left     leg  . hiatal hernia surgery  2010  . ROTATOR CUFF REPAIR Right 2010  . TONSILLECTOMY      Current Outpatient Medications  Medication Sig Dispense Refill  . apixaban (ELIQUIS) 5 MG TABS tablet  Take 1 tablet (5 mg total) by mouth 2 (two) times daily. 180 tablet 1  . Ascorbic Acid (VITAMIN C) 1000 MG tablet Take 1,000 mg by mouth 2 (two) times daily.    . Calcium Carb-Cholecalciferol (CALTRATE 600+D3 SOFT PO) Take 1 tablet by mouth 2 (two) times daily.    . ciprofloxacin (CIPRO) 500 MG tablet Take 500 mg 2 (two) times daily by mouth.    . CRANBERRY PO Take 2 tablets by mouth daily.    . Cyanocobalamin (VITAMIN B-12) 1000 MCG SUBL Place 1 each under the tongue daily.    Melanie [name] diltiazem (CARDIZEM) 90 MG tablet TAKE 1 TABLET TWICE DAILY 180 tablet 1  . ezetimibe (ZETIA) 10 MG tablet Take 1 tablet (10 mg total) by mouth daily. 30 tablet 3  . fish oil-omega-3 fatty acids 1000 MG capsule Take 2 g by mouth daily.    Melanie [name] lisinopril-hydrochlorothiazide (PRINZIDE,ZESTORETIC) 20-12.5 MG tablet TAKE 1 TABLET EVERY DAY 90 tablet 0  . metFORMIN (GLUCOPHAGE) 500 MG tablet TAKE 1 TABLET TWICE DAILY WITH MEALS 180 tablet 1  . metoprolol tartrate (LOPRESSOR) 25 MG tablet Take 1 tablet (25 mg total) by mouth 2 (two) times daily. 180 tablet 3  . Probiotic Product (PROBIOTIC PO) Take 1 capsule by mouth daily.     . ranitidine (ZANTAC) 300 MG tablet Take 1 tablet (300 mg total) by mouth at bedtime.  30 tablet 3  . fluticasone (FLONASE) 50 MCG/ACT nasal spray Place 2 sprays into both nostrils daily as needed for allergies or rhinitis.    . furosemide (LASIX) 20 MG tablet Take 1 tablet (20 mg total) by mouth daily. (Patient not taking: Reported on 10/28/2017) 30 tablet 3   No current facility-administered medications for this encounter.     Allergies  Allergen Reactions  . Niacin And Related Rash  . Keflex [Cephalexin] Other (See Comments)    Fatigue, sore throat, body aches  . Sausage [Pickled Meat] Other (See Comments)    Drowsiness and dizziness  . Tape Rash    Bandaids     Social History   Socioeconomic History  . Marital status: Widowed    Spouse name: Not on file  . Number of children: 3  . Years  of education: Not on file  . Highest education level: Not on file  Social Needs  . Financial resource strain: Not on file  . Food insecurity - worry: Not on file  . Food insecurity - inability: Not on file  . Transportation needs - medical: Not on file  . Transportation needs - non-medical: Not on file  Occupational History  . Not on file  Tobacco Use  . Smoking status: Never Smoker  . Smokeless tobacco: Never Used  Substance and Sexual Activity  . Alcohol use: No  . Drug use: No  . Sexual activity: Not on file  Other Topics Concern  . Not on file  Social History Narrative  . Not on file    Family History  Problem Relation Age of Onset  . Colon cancer Brother 53  . Colon cancer Maternal Aunt 45  . Breast cancer Paternal Grandmother   . Breast cancer Maternal Aunt   . Diabetes Maternal Aunt   . Heart disease Mother   . Stomach cancer Neg Hx     ROS- All systems are reviewed and negative except as per the HPI above  Physical Exam: Vitals:   10/28/17 1424  BP: 124/66  Pulse: (!) 59  Weight: 189 lb 12.8 oz (86.1 kg)  Height: 5\' 4"  (1.626 m)   Wt Readings from Last 3 Encounters:  10/28/17 189 lb 12.8 oz (86.1 kg)  09/16/17 192 lb 4 oz (87.2 kg)  05/06/17 185 lb 8 oz (84.1 kg)    Labs: Lab Results  Component Value Date   NA 139 09/16/2017   K 4.1 09/16/2017   CL 103 09/16/2017   CO2 30 09/16/2017   GLUCOSE 118 (H) 09/16/2017   BUN 14 09/16/2017   CREATININE 0.94 09/16/2017   CALCIUM 9.4 09/16/2017   No results found for: INR Lab Results  Component Value Date   CHOL 213 (H) 09/16/2017   HDL 36.50 (L) 09/16/2017   LDLCALC 144 (H) 09/16/2017   TRIG 161.0 (H) 09/16/2017     GEN- The patient is well appearing, alert and oriented x 3 today.   Head- normocephalic, atraumatic Eyes-  Sclera clear, conjunctiva pink Ears- hearing intact Oropharynx- clear Neck- supple, no JVP Lymph- no cervical lymphadenopathy Lungs- Clear to ausculation bilaterally,  normal work of breathing Heart- irregular rate and rhythm, no murmurs, rubs or gallops, PMI not laterally displaced GI- soft, NT, ND, + BS Extremities- no clubbing, cyanosis, or edema MS- no significant deformity or atrophy Skin- no rash or lesion Psych- euthymic mood, full affect Neuro- strength and sensation are intact  EKG- sinus brady at 59 bpm, pr int 182 ms, qrs int  86 ms, qtc 405 ms Epic records reviewed  Echo-Study Conclusions  - Left ventricle: The cavity size was normal. Wall thickness was   normal. Systolic function was normal. The estimated ejection   fraction was in the range of 55% to 60%. Wall motion was normal;   there were no regional wall motion abnormalities. Doppler   parameters are consistent with abnormal left ventricular   relaxation (grade 1 diastolic dysfunction). - Mitral valve: Calcified annulus. Mildly thickened leaflets . - Left atrium: The atrium was moderately dilated.  Impressions:  - Normal LV systolic function; grade 1 diastolic dysfunction;   moderate LAE; mild TR.   Assessment and Plan: 1 Paroxsymal afib Staying in SR, no afib to report Continue on metoprolol 25 mg bid Continue Cardizem 90 mg bid( pt has been on this for years) Pt is wanting to switch over to warfarin as she can no longer afford eliquis, she will contact her PCP to see if it can be followed there, if not, will refer to the [employer] score is at least 5  F/u in 9 months, sooner if needed   [name] C. Lillyian Heidt, [facility] [address] [place], [place] [address] [phone]

## 2017-11-02 ENCOUNTER — Telehealth: Payer: Self-pay | Admitting: Family Medicine

## 2017-11-02 NOTE — Telephone Encounter (Signed)
Please advise pt is currently on eliquis

## 2017-11-02 NOTE — Telephone Encounter (Signed)
She is seeing Afib clinic- will need to contact them about her desire to switch

## 2017-11-02 NOTE — Telephone Encounter (Signed)
Pt called wanting discus switching

## 2017-11-02 NOTE — Telephone Encounter (Signed)
Patient notified of PCP recommendations and is agreement and expresses an understanding.  

## 2017-11-02 NOTE — Telephone Encounter (Signed)
Pt called wanting to switch medication to Coumadin, citing inability to afford current prescription and asked if we preform a blood test for it. Please call back.

## 2017-11-19 ENCOUNTER — Telehealth: Payer: Self-pay | Admitting: Pharmacist

## 2017-11-19 NOTE — Telephone Encounter (Signed)
LM on both numbers to call back to coordinate transition from Eliquis to warfarin. Will do 3 day overlap with Eliquis and warfarin 5mg  daily. Check INR on Day 5.

## 2017-11-19 NOTE — Telephone Encounter (Signed)
Spoke with patient and she would prefer to transition next year as copay for visits will decrease at that time. However, she is not able to afford Eliquis at this time.  We will provide her samples to get her closer to next year (decreasing the number of visits before insurance changes). She is aware to call when she has a two week supply left so that transition can be coordinated to warfarin.

## 2017-11-19 NOTE — Telephone Encounter (Signed)
-----   Message from Juluis Mire, RN sent at 11/19/2017 10:04 AM EST ----- Regarding: coumadin Pt is currently on Eliquis -- requesting to switch to coumadin due to drug costs. Please contact pt to help with transition and management. She attempted to go through her PCP but they stated would rather be through cardiology. Best contact is [phone] or [phone]. She has a week of Eliquis left. Thanks [employer]

## 2017-11-20 ENCOUNTER — Encounter: Payer: Self-pay | Admitting: Physician Assistant

## 2017-11-20 ENCOUNTER — Ambulatory Visit: Payer: Medicare HMO | Admitting: Physician Assistant

## 2017-11-20 ENCOUNTER — Other Ambulatory Visit: Payer: Self-pay

## 2017-11-20 VITALS — BP 128/87 | HR 60 | Temp 98.2°F | Resp 14 | Ht 64.0 in | Wt 188.0 lb

## 2017-11-20 DIAGNOSIS — R195 Other fecal abnormalities: Secondary | ICD-10-CM

## 2017-11-20 DIAGNOSIS — N390 Urinary tract infection, site not specified: Secondary | ICD-10-CM | POA: Diagnosis not present

## 2017-11-20 DIAGNOSIS — M79662 Pain in left lower leg: Secondary | ICD-10-CM | POA: Diagnosis not present

## 2017-11-20 LAB — COMPREHENSIVE METABOLIC PANEL
ALBUMIN: 4.4 g/dL (ref 3.5–5.2)
ALK PHOS: 53 U/L (ref 39–117)
ALT: 10 U/L (ref 0–35)
AST: 14 U/L (ref 0–37)
BUN: 14 mg/dL (ref 6–23)
CO2: 32 mEq/L (ref 19–32)
Calcium: 9.4 mg/dL (ref 8.4–10.5)
Chloride: 101 mEq/L (ref 96–112)
Creatinine, Ser: 1.05 mg/dL (ref 0.40–1.20)
GFR: 54.11 mL/min — ABNORMAL LOW (ref >60.00)
GLUCOSE: 106 mg/dL — AB (ref 70–99)
Potassium: 3.8 mEq/L (ref 3.5–5.1)
SODIUM: 139 meq/L (ref 135–145)
TOTAL PROTEIN: 6.8 g/dL (ref 6.0–8.3)
Total Bilirubin: 0.7 mg/dL (ref 0.2–1.2)

## 2017-11-20 LAB — POCT URINALYSIS DIPSTICK
BILIRUBIN UA: NEGATIVE
Blood, UA: NEGATIVE
Glucose, UA: NEGATIVE
KETONES UA: NEGATIVE
Nitrite, UA: NEGATIVE
PH UA: 5 (ref 5.0–8.0)
PROTEIN UA: NEGATIVE
SPEC GRAV UA: 1.025 (ref 1.010–1.025)
Urobilinogen, UA: 0.2 E.U./dL

## 2017-11-20 MED ORDER — TIZANIDINE HCL 2 MG PO TABS: ORAL_TABLET | ORAL | 0 refills | Status: DC

## 2017-11-20 NOTE — Progress Notes (Signed)
Patient presents to clinic today with multiple complaints.  Patient endorses recent treatment for UTI with resolution of symptoms. States that over the past couple of days she has noted a couple of episodes of very slight burning with urination. Seems to have resolved today. Denies fever, chills, nausea/vomiting, flank pain.  Patient also endorses a very long-standing history of fluctuations in bowel habits. States she alternates between formed stools and loose stools. Even though she notes loose consistency of stools, she denies true diarrhea or fecal urgency/frequency. Denies fever, chills, malaise or fatigue. Denies abdominal cramping. Again denies nausea/vomiting. States this also worsens with antibiotic use. She does take a daily probiotic.  Lastly patient endorses some pain in her LLE with rising from a seated position. Pain occurs with this movement and usually dissipates after walking a few steps. Denies any known trauma or injury. Denies swelling, numbness, tingling or bruising.   Past Medical History:  Diagnosis Date  . Allergic rhinitis   . Arthritis   . Diabetes mellitus   . GERD (gastroesophageal reflux disease)   . Hyperlipidemia   . Hypertension   . Pain in joint, ankle and foot 09/14/2013    Current Outpatient Medications on File Prior to Visit  Medication Sig Dispense Refill  . apixaban (ELIQUIS) 5 MG TABS tablet Take 1 tablet (5 mg total) by mouth 2 (two) times daily. 180 tablet 1  . Ascorbic Acid (VITAMIN C) 1000 MG tablet Take 1,000 mg by mouth 2 (two) times daily.    . Calcium Carb-Cholecalciferol (CALTRATE 600+D3 SOFT PO) Take 1 tablet by mouth 2 (two) times daily.    [name] CRANBERRY PO Take 2 tablets by mouth daily.    . Cyanocobalamin (VITAMIN B-12) 1000 MCG SUBL Place 1 each under the tongue daily.    [name] diltiazem (CARDIZEM) 90 MG tablet TAKE 1 TABLET TWICE DAILY 180 tablet 1  . ezetimibe (ZETIA) 10 MG tablet Take 1 tablet (10 mg total) by mouth daily. 30 tablet 3    . fish oil-omega-3 fatty acids 1000 MG capsule Take 2 g by mouth daily.    . fluticasone (FLONASE) 50 MCG/ACT nasal spray Place 2 sprays into both nostrils daily as needed for allergies or rhinitis.    . furosemide (LASIX) 20 MG tablet Take 1 tablet (20 mg total) by mouth daily. 30 tablet 3  . lisinopril-hydrochlorothiazide (PRINZIDE,ZESTORETIC) 20-12.5 MG tablet TAKE 1 TABLET EVERY DAY 90 tablet 0  . metFORMIN (GLUCOPHAGE) 500 MG tablet TAKE 1 TABLET TWICE DAILY WITH MEALS 180 tablet 1  . metoprolol tartrate (LOPRESSOR) 25 MG tablet Take 1 tablet (25 mg total) by mouth 2 (two) times daily. 180 tablet 3  . Probiotic Product (PROBIOTIC PO) Take 1 capsule by mouth daily.     . ranitidine (ZANTAC) 300 MG tablet Take 1 tablet (300 mg total) by mouth at bedtime. 30 tablet 3   No current facility-administered medications on file prior to visit.     Allergies  Allergen Reactions  . Niacin And Related Rash  . Keflex [Cephalexin] Other (See Comments)    Fatigue, sore throat, body aches  . Sausage [Pickled Meat] Other (See Comments)    Drowsiness and dizziness  . Tape Rash    Bandaids     Family History  Problem Relation Age of Onset  . Colon cancer Brother 98  . Colon cancer Maternal Aunt 42  . Breast cancer Paternal Grandmother   . Breast cancer Maternal Aunt   . Diabetes Maternal Aunt   .  Heart disease Mother   . Stomach cancer Neg Hx     Social History   Socioeconomic History  . Marital status: Widowed    Spouse name: None  . Number of children: 3  . Years of education: None  . Highest education level: None  Social Needs  . Financial resource strain: None  . Food insecurity - worry: None  . Food insecurity - inability: None  . Transportation needs - medical: None  . Transportation needs - non-medical: None  Occupational History  . None  Tobacco Use  . Smoking status: Never Smoker  . Smokeless tobacco: Never Used  Substance and Sexual Activity  . Alcohol use: No  .  Drug use: No  . Sexual activity: None  Other Topics Concern  . None  Social History Narrative  . None   Review of Systems - See HPI.  All other ROS are negative.  BP 128/87   Pulse 60   Temp 98.2 F (36.8 C) (Oral)   Resp 14   Ht '5\' 4"'$  (1.626 m)   Wt 188 lb (85.3 kg)   SpO2 98%   BMI 32.27 kg/m   Physical Exam  Constitutional: She is oriented to person, place, and time and well-developed, well-nourished, and in no distress.  HENT:  Head: Normocephalic and atraumatic.  Eyes: Conjunctivae are normal.  Neck: Neck supple.  Cardiovascular: Normal rate, regular rhythm, normal heart sounds and intact distal pulses.  Pulmonary/Chest: Effort normal and breath sounds normal. No respiratory distress. She has no wheezes. She has no rales. She exhibits no tenderness.  Abdominal: Soft. She exhibits no distension and no mass. There is no tenderness. There is no rebound and no guarding.  Musculoskeletal:       Left knee: Normal.       Left ankle: Normal.       Left lower leg: She exhibits tenderness (with extension of knee and when standing from a seated position). She exhibits no bony tenderness, no swelling, no edema, no deformity and no laceration.  Neurological: She is alert and oriented to person, place, and time.  Skin: Skin is warm and dry. No rash noted.  Psychiatric: Affect normal.  Vitals reviewed.  Recent Results (from the past 2160 hour(s))  Hemoglobin A1c     Status: Abnormal   Collection Time: 09/16/17  1:35 PM  Result Value Ref Range   Hgb A1c MFr Bld 7.2 (H) 4.6 - 6.5 %    Comment: Glycemic Control Guidelines for People with Diabetes:Non Diabetic:  <6%Goal of Therapy: <7%Additional Action Suggested:  >8%   Lipid panel     Status: Abnormal   Collection Time: 09/16/17  1:35 PM  Result Value Ref Range   Cholesterol 213 (H) 0 - 200 mg/dL    Comment: ATP III Classification       Desirable:  < 200 mg/dL               Borderline High:  200 - 239 mg/dL          High:  > = 240  mg/dL   Triglycerides 161.0 (H) 0.0 - 149.0 mg/dL    Comment: Normal:  <150 mg/dLBorderline High:  150 - 199 mg/dL   HDL 36.50 (L) >39.00 mg/dL   VLDL 32.2 0.0 - 40.0 mg/dL   LDL Cholesterol 144 (H) 0 - 99 mg/dL   Total CHOL/HDL Ratio 6     Comment:  Men          Women1/2 Average Risk     3.4          3.3Average Risk          5.0          4.42X Average Risk          9.6          7.13X Average Risk          15.0          11.0                       NonHDL 176.32     Comment: NOTE:  Non-HDL goal should be 30 mg/dL higher than patient's LDL goal (i.e. LDL goal of < 70 mg/dL, would have non-HDL goal of < 100 mg/dL)  Basic metabolic panel     Status: Abnormal   Collection Time: 09/16/17  1:35 PM  Result Value Ref Range   Sodium 139 135 - 145 mEq/L   Potassium 4.1 3.5 - 5.1 mEq/L   Chloride 103 96 - 112 mEq/L   CO2 30 19 - 32 mEq/L   Glucose, Bld 118 (H) 70 - 99 mg/dL   BUN 14 6 - 23 mg/dL   Creatinine, Ser 0.94 0.40 - 1.20 mg/dL   Calcium 9.4 8.4 - 10.5 mg/dL   GFR 61.51 >60.00 mL/min  Hepatic function panel     Status: None   Collection Time: 09/16/17  1:35 PM  Result Value Ref Range   Total Bilirubin 0.6 0.2 - 1.2 mg/dL   Bilirubin, Direct 0.1 0.0 - 0.3 mg/dL   Alkaline Phosphatase 51 39 - 117 U/L   AST 10 0 - 37 U/L   ALT 8 0 - 35 U/L   Total Protein 6.6 6.0 - 8.3 g/dL   Albumin 4.2 3.5 - 5.2 g/dL  TSH     Status: None   Collection Time: 09/16/17  1:35 PM  Result Value Ref Range   TSH 1.79 0.35 - 4.50 uIU/mL  CBC with Differential/Platelet     Status: None   Collection Time: 09/16/17  1:35 PM  Result Value Ref Range   WBC 6.9 4.0 - 10.5 K/uL   RBC 4.28 3.87 - 5.11 Mil/uL   Hemoglobin 13.1 12.0 - 15.0 g/dL   HCT 39.1 36.0 - 46.0 %   MCV 91.3 78.0 - 100.0 fl   MCHC 33.4 30.0 - 36.0 g/dL   RDW 12.5 11.5 - 15.5 %   Platelets 247.0 150.0 - 400.0 K/uL   Neutrophils Relative % 57.4 43.0 - 77.0 %   Lymphocytes Relative 30.1 12.0 - 46.0 %   Monocytes Relative 8.4  3.0 - 12.0 %   Eosinophils Relative 3.0 0.0 - 5.0 %   Basophils Relative 1.1 0.0 - 3.0 %   Neutro Abs 4.0 1.4 - 7.7 K/uL   Lymphs Abs 2.1 0.7 - 4.0 K/uL   Monocytes Absolute 0.6 0.1 - 1.0 K/uL   Eosinophils Absolute 0.2 0.0 - 0.7 K/uL   Basophils Absolute 0.1 0.0 - 0.1 K/uL    Assessment/Plan: 1. Loose stools Chronic and intermittent. No alarm signs/symptoms. Exam unremarkable. Will check CMP today. Continue daily probiotic. Discussed increasing fiber in the diet and keeping hydration consistent. If not improving, can check stool cultures and send to GI for further assessment. - Comp Met (CMET)  2. Recurrent UTI (urinary tract infection) Urine dip unremarkable today. Will check  culture. Patient given supportive measures to follow. Will alter regimen based on culture results but will hold off on ABX today since there are no symptoms presently.  - POCT Urinalysis Dipstick - Urine Culture  3. Pain of left lower leg Muscular in etiology. Exam unremarkable except for pain noted with positional change. No joint pain or deformity. Strength 5/5. Discussed RICE therapy and OTC medication. Knee sleeve recommended. Follow-up if not improving.   Leeanne Rio, PA-C

## 2017-11-20 NOTE — Progress Notes (Signed)
Pre visit review using our clinic review tool, if applicable. No additional management support is needed unless otherwise documented below in the visit note. 

## 2017-11-20 NOTE — Patient Instructions (Signed)
Please go to the lab today for blood work.  I will call you with your results. We will alter treatment regimen(s) if indicated by your results.   Please stay well-hydrated and get plenty of rest. Eat a consistent diet higher in fiber. Keep good water intake. Continue daily probiotic.  Try to keep leg elevated while resting.  An ACE wrap to add compression can be beneficial. I really recommend physical therapy to help with this. Since you are declining today, you can try the low dose muscle relaxer I have given to see if this helps. Can take in the afternoon/evening. Can make you sleepy so no driving or operating machinery while on medication.

## 2017-11-23 ENCOUNTER — Other Ambulatory Visit: Payer: Self-pay | Admitting: Cardiovascular Disease

## 2017-11-23 ENCOUNTER — Telehealth: Payer: Self-pay | Admitting: Physician Assistant

## 2017-11-23 LAB — URINE CULTURE
MICRO NUMBER:: 81349185
SPECIMEN QUALITY:: ADEQUATE

## 2017-11-23 MED ORDER — CEFDINIR 300 MG PO CAPS: 300.0000 mg | ORAL_CAPSULE | Freq: Two times a day (BID) | ORAL | 0 refills | Status: DC

## 2017-11-23 NOTE — Telephone Encounter (Signed)
Patient has an intolerance to Keflex -- fatigue, aches and sore throat. This is not a true allergy so Cefdinir is an acceptable choice. I have personally sent in the antibiotic for the patient.

## 2017-11-23 NOTE — Telephone Encounter (Signed)
Pt. Returned call; was given Urine Culture results.  Advised of recommendation by Elyn Aquas, PA.  Noted a cross allergy alert, when attempting to order the Cefdinir, as pt. is allergic to Cephalexin.  Reported "the Cephalexin makes me drowsy and crazy." Stated she continues to have frequency of urination, and burning with urination.  Will make Elyn Aquas, [place] aware of the cross allergy alert.  Verb. Understanding.

## 2017-11-24 ENCOUNTER — Telehealth: Payer: Self-pay | Admitting: Physician Assistant

## 2017-11-24 MED ORDER — CIPROFLOXACIN HCL 500 MG PO TABS: 500.0000 mg | ORAL_TABLET | Freq: Two times a day (BID) | ORAL | 0 refills | Status: DC

## 2017-11-24 NOTE — Telephone Encounter (Signed)
Previous phone note was created for this.  I have copied the message below over to the original phone note and routed to provider.

## 2017-11-24 NOTE — Telephone Encounter (Signed)
This is a [facility] clinic pt 

## 2017-11-24 NOTE — Addendum Note (Signed)
Addended by: Brunetta Jeans on: 11/24/2017 09:31 AM   Modules accepted: Orders

## 2017-11-24 NOTE — Telephone Encounter (Signed)
Patient has been notified of medication sent to pharmacy. She has also been advised to not take tizanidine while on this medication.  Patient stated verbal understanding.

## 2017-11-24 NOTE — Telephone Encounter (Signed)
Copied from [place]. Topic: Quick Communication - See Telephone Encounter >> Nov 24, 2017  8:46 AM Aurelio Brash B wrote: CRM for notification. See Telephone encounter for:  Pt was seen Friday, culture came back yesterday, Dr called med in, pt is not happy with the med, says it makes her feel confused  and have diarrhea.  Pt wants to know if the Dr can call her in something else [facility] [address]  11/24/17.

## 2017-11-24 NOTE — Telephone Encounter (Signed)
CRM for notification. See Telephone encounter for:  Pt was seen Friday, culture came back yesterday, Dr called med in, pt is not happy with the med, says it makes her feel confused  and have diarrhea.  Pt wants to know if the Dr can call her in something [facility] [address]  11/24/17.

## 2017-11-24 NOTE — Telephone Encounter (Signed)
Sent in Rx for Cipro to take twice daily x 5 days. Can have pharmacy restock the other antibiotic. She needs to refrain from taking her Tizanidine while on this antibiotic.

## 2017-12-04 DIAGNOSIS — H2513 Age-related nuclear cataract, bilateral: Secondary | ICD-10-CM | POA: Diagnosis not present

## 2017-12-04 DIAGNOSIS — H5203 Hypermetropia, bilateral: Secondary | ICD-10-CM | POA: Diagnosis not present

## 2017-12-04 DIAGNOSIS — E119 Type 2 diabetes mellitus without complications: Secondary | ICD-10-CM | POA: Diagnosis not present

## 2017-12-04 LAB — HM DIABETES EYE EXAM

## 2017-12-06 ENCOUNTER — Other Ambulatory Visit: Payer: Self-pay | Admitting: Physician Assistant

## 2017-12-08 ENCOUNTER — Encounter: Payer: Self-pay | Admitting: General Practice

## 2017-12-24 ENCOUNTER — Telehealth: Payer: Self-pay | Admitting: Cardiovascular Disease

## 2017-12-24 MED ORDER — WARFARIN SODIUM 5 MG PO TABS: 5.0000 mg | ORAL_TABLET | Freq: Every day | ORAL | 0 refills | Status: DC

## 2017-12-24 NOTE — Telephone Encounter (Signed)
Spoke with patient and advised 3 day overlap of Eliquis and warfarin starting on 12/28/17 as patient has 7 day supply of Eliquis. She will start warfarin 5mg  daily and is scheduled for New Coumadin visit on 01/01/18. Patient states understanding and appreciation.

## 2017-12-24 NOTE — Telephone Encounter (Signed)
New message    Patient calling to request switch from Eliquis to Warfarin. Please call

## 2017-12-26 DIAGNOSIS — H579 Unspecified disorder of eye and adnexa: Secondary | ICD-10-CM | POA: Diagnosis not present

## 2017-12-26 DIAGNOSIS — H5711 Ocular pain, right eye: Secondary | ICD-10-CM | POA: Diagnosis not present

## 2017-12-28 ENCOUNTER — Other Ambulatory Visit: Payer: Self-pay | Admitting: Physician Assistant

## 2017-12-29 NOTE — Telephone Encounter (Signed)
Will defer further refills of patient's medications to PCP  

## 2018-01-01 ENCOUNTER — Ambulatory Visit (INDEPENDENT_AMBULATORY_CARE_PROVIDER_SITE_OTHER): Payer: Medicare HMO | Admitting: *Deleted

## 2018-01-01 DIAGNOSIS — I4891 Unspecified atrial fibrillation: Secondary | ICD-10-CM | POA: Insufficient documentation

## 2018-01-01 DIAGNOSIS — Z5181 Encounter for therapeutic drug level monitoring: Secondary | ICD-10-CM | POA: Insufficient documentation

## 2018-01-01 LAB — POCT INR: INR: 1.1

## 2018-01-01 NOTE — Patient Instructions (Addendum)
A full discussion of the nature of anticoagulants has been carried out.  A benefit risk analysis has been presented to the patient, so that they understand the justification for choosing anticoagulation at this time. The need for frequent and regular monitoring, precise dosage adjustment and compliance is stressed.  Side effects of potential bleeding are discussed.  The patient should avoid any OTC items containing aspirin or ibuprofen, and should avoid great swings in general diet.  Avoid alcohol consumption.  Call if any signs of abnormal bleeding. If you miss a dose you have 12 hours to take that dose.   Description   Today take 1.5 tablets then start taking 1 tablet daily except 1.5 tablets on Mondays and Fridays.  Call Coumadin Clinic with any new meds or procedures: Anticoagulation Clinic [phone] Main [phone]

## 2018-01-08 ENCOUNTER — Other Ambulatory Visit: Payer: Self-pay | Admitting: Family Medicine

## 2018-01-08 ENCOUNTER — Ambulatory Visit (INDEPENDENT_AMBULATORY_CARE_PROVIDER_SITE_OTHER): Payer: Medicare HMO | Admitting: *Deleted

## 2018-01-08 DIAGNOSIS — I4891 Unspecified atrial fibrillation: Secondary | ICD-10-CM

## 2018-01-08 DIAGNOSIS — Z5181 Encounter for therapeutic drug level monitoring: Secondary | ICD-10-CM

## 2018-01-08 LAB — POCT INR: INR: 1.4

## 2018-01-08 NOTE — Patient Instructions (Signed)
Description   Today Jan 18th take 2 tablet (10mg ) then tomorrow Jan 19th take 1 and 1/2 tablets (7.5mg ) then change coumadin dose to  1 tablet (5mg )  daily except 1 and 1/2 tablets (7.5mg )  on Mondays, Wednesdays  and Fridays.  Call Coumadin Clinic with any new meds or procedures: Anticoagulation Clinic [phone] Main [phone]

## 2018-01-13 NOTE — Progress Notes (Addendum)
Subjective:   Melanie [name] is a 77 y.o. female who presents for Medicare Annual (Subsequent) preventive examination.  Review of Systems:  No ROS.  Medicare Wellness Visit. Additional risk factors are reflected in the social history.  Cardiac Risk Factors include: advanced age (>47men, >43 women);diabetes mellitus;dyslipidemia;hypertension;obesity (BMI >30kg/m2);sedentary lifestyle;smoking/ tobacco exposure;family history of premature cardiovascular disease   Sleep patterns: Sleeps 8 hours. Up to void x 3.  Home Safety/Smoke Alarms: Feels safe in home. Smoke alarms in place.  Living environment; residence and Firearm Safety: Lives with 2 adult sons in 1 story home.  Seat Belt Safety/Bike Helmet: Wears seat belt.   Female:   [id number]       Mammo-05/15/2016, negative.       Dexa scan-05/15/2016, Osteopenia.          [facility]S-Colonoscopy 08/18/2012, polyp. Recall 10 years.      Objective:     Vitals: There were no vitals taken for this visit.  There is no height or weight on file to calculate BMI.  Advanced Directives 01/14/2018 12/29/2016 09/10/2015  Does Patient Have a Medical Advance Directive? No No No  Would patient like information on creating a medical advance directive? Yes (MAU/Ambulatory/Procedural Areas - Information given) No - Patient declined -    Tobacco Social History   Tobacco Use  Smoking Status Never Smoker  Smokeless Tobacco Never Used     Counseling given: Not Answered   Past Medical History:  Diagnosis Date  . Allergic rhinitis   . Arthritis   . Diabetes mellitus   . GERD (gastroesophageal reflux disease)   . Hyperlipidemia   . Hypertension   . Pain in joint, ankle and foot 09/14/2013   Past Surgical History:  Procedure Laterality Date  . FRACTURE SURGERY Left     leg  . hiatal hernia surgery  2010  . ROTATOR CUFF REPAIR Right 2010  . TONSILLECTOMY     Family History  Problem Relation Age of Onset  . Colon cancer Brother 27  . Colon  cancer Maternal Aunt 20  . Breast cancer Paternal Grandmother   . Breast cancer Maternal Aunt   . Diabetes Maternal Aunt   . Heart disease Mother   . Stomach cancer Neg Hx    Social History   Socioeconomic History  . Marital status: Widowed    Spouse name: Not on file  . Number of children: 3  . Years of education: Not on file  . Highest education level: Not on file  Social Needs  . Financial resource strain: Not on file  . Food insecurity - worry: Not on file  . Food insecurity - inability: Not on file  . Transportation needs - medical: Not on file  . Transportation needs - non-medical: Not on file  Occupational History  . Not on file  Tobacco Use  . Smoking status: Never Smoker  . Smokeless tobacco: Never Used  Substance and Sexual Activity  . Alcohol use: No  . Drug use: No  . Sexual activity: Not on file  Other Topics Concern  . Not on file  Social History Narrative  . Not on file    Outpatient Encounter Medications as of 01/14/2018  Medication Sig  . Ascorbic Acid (VITAMIN C) 1000 MG tablet Take 1,000 mg by mouth 2 (two) times daily.  . Calcium Carb-Cholecalciferol (CALTRATE 600+D3 SOFT PO) Take 1 tablet by mouth 2 (two) times daily.  [name] CRANBERRY PO Take 2 tablets by mouth daily.  . Cyanocobalamin (VITAMIN  B-12) 1000 MCG SUBL Place 1 each under the tongue daily.  [name] diltiazem (CARDIZEM) 90 MG tablet Take 1 tablet (90 mg total) by mouth 2 (two) times daily.  [name] ezetimibe (ZETIA) 10 MG tablet Take 1 tablet (10 mg total) by mouth daily. (Patient not taking: Reported on 01/14/2018)  . fish oil-omega-3 fatty acids 1000 MG capsule Take 2 g by mouth daily.  . fluticasone (FLONASE) 50 MCG/ACT nasal spray Place 2 sprays into both nostrils daily as needed for allergies or rhinitis.  . furosemide (LASIX) 20 MG tablet Take 1 tablet (20 mg total) by mouth daily.  [name] lisinopril-hydrochlorothiazide (PRINZIDE,ZESTORETIC) 20-12.5 MG tablet Take 1 tablet by mouth daily.  . metFORMIN  (GLUCOPHAGE) 500 MG tablet Take 1 tablet (500 mg total) by mouth 2 (two) times daily with a meal.  . metoprolol tartrate (LOPRESSOR) 25 MG tablet Take 1 tablet (25 mg total) by mouth 2 (two) times daily.  . Probiotic Product (PROBIOTIC PO) Take 1 capsule by mouth daily.   . ranitidine (ZANTAC) 300 MG tablet Take 1 tablet (300 mg total) by mouth at bedtime.  [name] warfarin (COUMADIN) 5 MG tablet Take 1 tablet (5 mg total) by mouth daily.  . [DISCONTINUED] diltiazem (CARDIZEM) 90 MG tablet TAKE 1 TABLET TWICE DAILY  . [DISCONTINUED] furosemide (LASIX) 20 MG tablet TAKE 1 TABLET BY MOUTH EVERY DAY  . [DISCONTINUED] lisinopril-hydrochlorothiazide (PRINZIDE,ZESTORETIC) 20-12.5 MG tablet TAKE 1 TABLET EVERY DAY  . [DISCONTINUED] metFORMIN (GLUCOPHAGE) 500 MG tablet TAKE 1 TABLET TWICE DAILY WITH MEALS  . [DISCONTINUED] metoprolol tartrate (LOPRESSOR) 25 MG tablet TAKE 1 TABLET (25 MG TOTAL) BY MOUTH 2 (TWO) TIMES DAILY.  . [DISCONTINUED] ranitidine (ZANTAC) 300 MG tablet Take 1 tablet (300 mg total) by mouth at bedtime.  . [DISCONTINUED] tiZANidine (ZANAFLEX) 2 MG tablet TAKE 1/2 TABLET IN THE EVENING FOR A FEW DAYS TO HELP WITH MUSCLE TENSION/SPASM (Patient not taking: Reported on 01/14/2018)   No facility-administered encounter medications on file as of 01/14/2018.     Activities of Daily Living In your present state of health, do you have any difficulty performing the following activities: 01/14/2018 01/14/2018  Hearing? N N  Vision? N N  Difficulty concentrating or making decisions? N N  Walking or climbing stairs? Y N  Comment Left knee pain -  Dressing or bathing? N N  Doing errands, shopping? N N  Preparing Food and eating ? N -  Using the Toilet? N -  In the past six months, have you accidently leaked urine? N -  Do you have problems with loss of bowel control? N -  Managing your Medications? N -  Managing your Finances? N -  Housekeeping or managing your Housekeeping? N -  Some recent data  might be hidden    Patient Care Team: [name], MD as PCP - General (Family Medicine) Ardis Hughs, MD as Attending Physician (Urology) Inda Castle, MD (Inactive) as Consulting Physician (Gastroenterology) Karl Luke, MD as Referring Physician (Optometry) Sherren Mocha, MD as Consulting Physician (Cardiology)    Assessment:   This is a routine wellness examination for [place].  Exercise Activities and Dietary recommendations Current Exercise Habits: The patient does not participate in regular exercise at present(Walks dog), Exercise limited by: None identified   Diet (meal preparation, eat out, water intake, caffeinated beverages, dairy products, fruits and vegetables): Drinks water.  Breakfast: oatmeal Lunch: sandwich Dinner:meat and vegetables.   Goals    . Increase physical activity  Increase activity by going to [facility].        Fall Risk Fall Risk  01/14/2018 01/14/2018 09/16/2017 11/27/2016 04/24/2016  Falls in the past year? No No No No No    Depression Screen PHQ 2/9 Scores 01/14/2018 01/14/2018 09/16/2017 11/27/2016  PHQ - 2 Score 0 0 0 0  PHQ- 9 Score - 0 0 0     Cognitive Function MMSE - Mini Mental State Exam 01/14/2018  Orientation to time 5  Orientation to Place 5  Registration 3  Attention/ Calculation 3  Recall 3  Language- name 2 objects 2  Language- repeat 1  Language- follow 3 step command 3  Language- read & follow direction 1  Write a sentence 1  Copy design 1  Total score 28        Immunization History  Administered Date(s) Administered  . Influenza Split 10/25/2012  . Influenza Whole 10/23/2011  . Influenza, Seasonal, Injecte, Preservative Fre 09/06/2013, 09/14/2014, 11/22/2015, 11/27/2016, 09/16/2017  . Influenza,inj,Quad PF,6+ Mos 09/06/2013, 09/14/2014, 11/22/2015, 11/27/2016, 09/16/2017  . Pneumococcal Conjugate-13 04/24/2016  . Pneumococcal Polysaccharide-23 10/22/2009, 01/14/2018  .  Pneumococcal-Unspecified 12/22/2008  . Td 12/23/2003    Screening Tests Health Maintenance  Topic Date Due  . MAMMOGRAM  05/15/2017  . TETANUS/TDAP  01/14/2019 (Originally 12/22/2013)  . HEMOGLOBIN A1C  03/16/2018  . FOOT EXAM  05/06/2018  . DEXA SCAN  05/15/2018  . OPHTHALMOLOGY EXAM  12/04/2018  . INFLUENZA VACCINE  Completed  . PNA vac Low Risk Adult  Completed       Plan:     Continue doing brain stimulating activities (puzzles, reading, adult coloring books, staying active) to keep memory sharp.   Bring a copy of your living will and/or healthcare power of attorney to your next office visit.  I have personally reviewed and noted the following in the patient's chart:   . Medical and social history . Use of alcohol, tobacco or illicit drugs  . Current medications and supplements . Functional ability and status . Nutritional status . Physical activity . Advanced directives . List of other physicians . Hospitalizations, surgeries, and ER visits in previous 12 months . Vitals . Screenings to include cognitive, depression, and falls . Referrals and appointments  In addition, I have reviewed and discussed with patient certain preventive protocols, quality metrics, and best practice recommendations. A written personalized care plan for preventive services as well as general preventive health recommendations were provided to patient.     Gerilyn Nestle, RN  01/14/2018   Reviewed documentation provided by RN and agree w/ above.  Annye Asa, MD

## 2018-01-14 ENCOUNTER — Encounter: Payer: Self-pay | Admitting: Family Medicine

## 2018-01-14 ENCOUNTER — Ambulatory Visit (INDEPENDENT_AMBULATORY_CARE_PROVIDER_SITE_OTHER): Payer: Medicare HMO | Admitting: Family Medicine

## 2018-01-14 ENCOUNTER — Other Ambulatory Visit: Payer: Self-pay

## 2018-01-14 ENCOUNTER — Ambulatory Visit (INDEPENDENT_AMBULATORY_CARE_PROVIDER_SITE_OTHER): Payer: Medicare HMO

## 2018-01-14 VITALS — BP 126/62 | HR 57 | Temp 98.0°F | Resp 16 | Ht 64.0 in | Wt 191.2 lb

## 2018-01-14 DIAGNOSIS — Z23 Encounter for immunization: Secondary | ICD-10-CM

## 2018-01-14 DIAGNOSIS — Z0000 Encounter for general adult medical examination without abnormal findings: Secondary | ICD-10-CM

## 2018-01-14 DIAGNOSIS — Z1239 Encounter for other screening for malignant neoplasm of breast: Secondary | ICD-10-CM

## 2018-01-14 DIAGNOSIS — Z1231 Encounter for screening mammogram for malignant neoplasm of breast: Secondary | ICD-10-CM

## 2018-01-14 DIAGNOSIS — R197 Diarrhea, unspecified: Secondary | ICD-10-CM

## 2018-01-14 DIAGNOSIS — M8580 Other specified disorders of bone density and structure, unspecified site: Secondary | ICD-10-CM | POA: Diagnosis not present

## 2018-01-14 DIAGNOSIS — E1142 Type 2 diabetes mellitus with diabetic polyneuropathy: Secondary | ICD-10-CM

## 2018-01-14 LAB — CBC WITH DIFFERENTIAL/PLATELET
Basophils Absolute: 0.1 10*3/uL (ref 0.0–0.1)
Basophils Relative: 1.5 % (ref 0.0–3.0)
EOS ABS: 0.2 10*3/uL (ref 0.0–0.7)
EOS PCT: 3.3 % (ref 0.0–5.0)
HCT: 40.5 % (ref 36.0–46.0)
HEMOGLOBIN: 13.5 g/dL (ref 12.0–15.0)
LYMPHS PCT: 31.5 % (ref 12.0–46.0)
Lymphs Abs: 1.9 10*3/uL (ref 0.7–4.0)
MCHC: 33.3 g/dL (ref 30.0–36.0)
MCV: 91.3 fl (ref 78.0–100.0)
Monocytes Absolute: 0.6 10*3/uL (ref 0.1–1.0)
Monocytes Relative: 9.3 % (ref 3.0–12.0)
Neutro Abs: 3.3 10*3/uL (ref 1.4–7.7)
Neutrophils Relative %: 54.4 % (ref 43.0–77.0)
Platelets: 208 10*3/uL (ref 150.0–400.0)
RBC: 4.44 Mil/uL (ref 3.87–5.11)
RDW: 12.8 % (ref 11.5–15.5)
WBC: 6 10*3/uL (ref 4.0–10.5)

## 2018-01-14 LAB — HEPATIC FUNCTION PANEL
ALBUMIN: 4.3 g/dL (ref 3.5–5.2)
ALT: 10 U/L (ref 0–35)
AST: 11 U/L (ref 0–37)
Alkaline Phosphatase: 59 U/L (ref 39–117)
Bilirubin, Direct: 0.1 mg/dL (ref 0.0–0.3)
Total Bilirubin: 0.5 mg/dL (ref 0.2–1.2)
Total Protein: 6.9 g/dL (ref 6.0–8.3)

## 2018-01-14 LAB — BASIC METABOLIC PANEL
BUN: 14 mg/dL (ref 6–23)
CALCIUM: 9.1 mg/dL (ref 8.4–10.5)
CO2: 32 mEq/L (ref 19–32)
CREATININE: 0.91 mg/dL (ref 0.40–1.20)
Chloride: 99 mEq/L (ref 96–112)
GFR: 63.8 mL/min (ref >60.00)
Glucose, Bld: 135 mg/dL — ABNORMAL HIGH (ref 70–99)
Potassium: 3.9 mEq/L (ref 3.5–5.1)
Sodium: 137 mEq/L (ref 135–145)

## 2018-01-14 LAB — LIPID PANEL
CHOL/HDL RATIO: 6
Cholesterol: 221 mg/dL — ABNORMAL HIGH (ref 0–200)
HDL: 37.5 mg/dL — AB (ref >39.00)
NONHDL: 183.22
TRIGLYCERIDES: 244 mg/dL — AB (ref 0.0–149.0)
VLDL: 48.8 mg/dL — AB (ref 0.0–40.0)

## 2018-01-14 LAB — TSH: TSH: 1.6 u[IU]/mL (ref 0.35–4.50)

## 2018-01-14 LAB — VITAMIN D 25 HYDROXY (VIT D DEFICIENCY, FRACTURES): VITD: 26.82 ng/mL — ABNORMAL LOW (ref 30.00–100.00)

## 2018-01-14 LAB — HEMOGLOBIN A1C: Hgb A1c MFr Bld: 7.1 % — ABNORMAL HIGH (ref 4.6–6.5)

## 2018-01-14 LAB — LDL CHOLESTEROL, DIRECT: LDL DIRECT: 145 mg/dL

## 2018-01-14 MED ORDER — LISINOPRIL-HYDROCHLOROTHIAZIDE 20-12.5 MG PO TABS: 1.0000 | ORAL_TABLET | Freq: Every day | ORAL | 1 refills | Status: DC

## 2018-01-14 MED ORDER — METFORMIN HCL 500 MG PO TABS: 500.0000 mg | ORAL_TABLET | Freq: Two times a day (BID) | ORAL | 1 refills | Status: DC

## 2018-01-14 MED ORDER — DILTIAZEM HCL 90 MG PO TABS: 90.0000 mg | ORAL_TABLET | Freq: Two times a day (BID) | ORAL | 1 refills | Status: DC

## 2018-01-14 MED ORDER — FUROSEMIDE 20 MG PO TABS: 20.0000 mg | ORAL_TABLET | Freq: Every day | ORAL | 1 refills | Status: DC

## 2018-01-14 MED ORDER — METOPROLOL TARTRATE 25 MG PO TABS: 25.0000 mg | ORAL_TABLET | Freq: Two times a day (BID) | ORAL | 1 refills | Status: DC

## 2018-01-14 MED ORDER — RANITIDINE HCL 300 MG PO TABS: 300.0000 mg | ORAL_TABLET | Freq: Every day | ORAL | 1 refills | Status: DC

## 2018-01-14 NOTE — Assessment & Plan Note (Signed)
Chronic problem.  Hx of good control.  UTD on foot exam, eye exam and on ACE for renal protection.  Check labs.  Adjust meds prn

## 2018-01-14 NOTE — Patient Instructions (Signed)
Follow up in 3-4 months to recheck diabetes We'll notify you of your lab results and make any changes if needed Continue to work on healthy diet and exercise as able We'll call you with your GI appt Please schedule your mammogram and bone density appt for late May [phone] Call with any questions or concerns Happy New Year!!!

## 2018-01-14 NOTE — Assessment & Plan Note (Signed)
Chronic problem.  Due for repeat DEXA in May- order entered.  Check Vit D and replete prn.

## 2018-01-14 NOTE — Progress Notes (Signed)
   Subjective:    Patient ID: Melanie [name], female    DOB: Feb 07, 1941, 77 y.o.   MRN: [id number]  HPI CPE- UTD on colonoscopy, due for mammo and DEXA in May.  UTD on foot exam, eye exam.  On ACE for renal protection.  Due for Pneumovax   Review of Systems Patient reports no vision/ hearing changes, adenopathy,fever, weight change,  persistant/recurrent hoarseness , swallowing issues, chest pain, palpitations, edema, persistant/recurrent cough, hemoptysis, dyspnea (rest/exertional/paroxysmal nocturnal), gastrointestinal bleeding (melena, rectal bleeding), abdominal pain, significant heartburn, GU symptoms (dysuria, hematuria, incontinence), Gyn symptoms (abnormal  bleeding, pain),  syncope, focal weakness, memory loss, numbness & tingling, skin/hair/nail changes, abnormal bruising or bleeding, anxiety, or depression.   Diarrhea- 'for a long time'.  It is episodic.  'I had it under control this summer but it's back'.  Denies abd pain or cramping    Objective:   Physical Exam General Appearance:    Alert, cooperative, no distress, appears stated age  Head:    Normocephalic, without obvious abnormality, atraumatic  Eyes:    PERRL, conjunctiva/corneas clear, EOM's intact, fundi    benign, both eyes  Ears:    Normal TM's and external ear canals, both ears  Nose:   Nares normal, septum midline, mucosa normal, no drainage    or sinus tenderness  Throat:   Lips, mucosa, and tongue normal; teeth and gums normal  Neck:   Supple, symmetrical, trachea midline, no adenopathy;    Thyroid: no enlargement/tenderness/nodules  Back:     Symmetric, no curvature, ROM normal, no CVA tenderness  Lungs:     Clear to auscultation bilaterally, respirations unlabored  Chest Wall:    No tenderness or deformity   Heart:    Regular rate and rhythm, S1 and S2 normal, no murmur, rub   or gallop  Breast Exam:    Deferred to mammo  Abdomen:     Soft, non-tender, bowel sounds active all four quadrants,    no  masses, no organomegaly  Genitalia:    Deferred at pt's request  Rectal:    Extremities:   Extremities normal, atraumatic, no cyanosis or edema  Pulses:   2+ and symmetric all extremities  Skin:   Skin color, texture, turgor normal, no rashes or lesions  Lymph nodes:   Cervical, supraclavicular, and axillary nodes normal  Neurologic:   CNII-XII intact, normal strength, sensation and reflexes    throughout          Assessment & Plan:

## 2018-01-14 NOTE — Patient Instructions (Addendum)
Continue doing brain stimulating activities (puzzles, reading, adult coloring books, staying active) to keep memory sharp.   Bring a copy of your living will and/or healthcare power of attorney to your next office visit.  Fall Prevention in the Home Falls can cause injuries. They can happen to people of all ages. There are many things you can do to make your home safe and to help prevent falls. What can I do on the outside of my home?  Regularly fix the edges of walkways and driveways and fix any cracks.  Remove anything that might make you trip as you walk through a door, such as a raised step or threshold.  Trim any bushes or trees on the path to your home.  Use bright outdoor lighting.  Clear any walking paths of anything that might make someone trip, such as rocks or tools.  Regularly check to see if handrails are loose or broken. Make sure that both sides of any steps have handrails.  Any raised decks and porches should have guardrails on the edges.  Have any leaves, snow, or ice cleared regularly.  Use sand or salt on walking paths during winter.  Clean up any spills in your garage right away. This includes oil or grease spills. What can I do in the bathroom?  Use night lights.  Install grab bars by the toilet and in the tub and shower. Do not use towel bars as grab bars.  Use non-skid mats or decals in the tub or shower.  If you need to sit down in the shower, use a plastic, non-slip stool.  Keep the floor dry. Clean up any water that spills on the floor as soon as it happens.  Remove soap buildup in the tub or shower regularly.  Attach bath mats securely with double-sided non-slip rug tape.  Do not have throw rugs and other things on the floor that can make you trip. What can I do in the bedroom?  Use night lights.  Make sure that you have a light by your bed that is easy to reach.  Do not use any sheets or blankets that are too big for your bed. They  should not hang down onto the floor.  Have a firm chair that has side arms. You can use this for support while you get dressed.  Do not have throw rugs and other things on the floor that can make you trip. What can I do in the kitchen?  Clean up any spills right away.  Avoid walking on wet floors.  Keep items that you use a lot in easy-to-reach places.  If you need to reach something above you, use a strong step stool that has a grab bar.  Keep electrical cords out of the way.  Do not use floor polish or wax that makes floors slippery. If you must use wax, use non-skid floor wax.  Do not have throw rugs and other things on the floor that can make you trip. What can I do with my stairs?  Do not leave any items on the stairs.  Make sure that there are handrails on both sides of the stairs and use them. Fix handrails that are broken or loose. Make sure that handrails are as long as the stairways.  Check any carpeting to make sure that it is firmly attached to the stairs. Fix any carpet that is loose or worn.  Avoid having throw rugs at the top or bottom of the stairs. If you  do have throw rugs, attach them to the floor with carpet tape.  Make sure that you have a light switch at the top of the stairs and the bottom of the stairs. If you do not have them, ask someone to add them for you. What else can I do to help prevent falls?  Wear shoes that: ? Do not have high heels. ? Have rubber bottoms. ? Are comfortable and fit you well. ? Are closed at the toe. Do not wear sandals.  If you use a stepladder: ? Make sure that it is fully opened. Do not climb a closed stepladder. ? Make sure that both sides of the stepladder are locked into place. ? Ask someone to hold it for you, if possible.  Clearly mark and make sure that you can see: ? Any grab bars or handrails. ? First and last steps. ? Where the edge of each step is.  Use tools that help you move around (mobility aids) if  they are needed. These include: ? Canes. ? Walkers. ? Scooters. ? Crutches.  Turn on the lights when you go into a dark area. Replace any light bulbs as soon as they burn out.  Set up your furniture so you have a clear path. Avoid moving your furniture around.  If any of your floors are uneven, fix them.  If there are any pets around you, be aware of where they are.  Review your medicines with your doctor. Some medicines can make you feel dizzy. This can increase your chance of falling. Ask your doctor what other things that you can do to help prevent falls. This information is not intended to replace advice given to you by your health care provider. Make sure you discuss any questions you have with your health care provider. Document Released: 10/04/2009 Document Revised: 05/15/2016 Document Reviewed: 01/12/2015 Elsevier Interactive Patient Education  2018 [place] Maintenance, Female Adopting a healthy lifestyle and getting preventive care can go a long way to promote health and wellness. Talk with your health care provider about what schedule of regular examinations is right for you. This is a good chance for you to check in with your provider about disease prevention and staying healthy. In between checkups, there are plenty of things you can do on your own. Experts have done a lot of research about which lifestyle changes and preventive measures are most likely to keep you healthy. Ask your health care provider for more information. Weight and diet Eat a healthy diet  Be sure to include plenty of vegetables, fruits, low-fat dairy products, and lean protein.  Do not eat a lot of foods high in solid fats, added sugars, or salt.  Get regular exercise. This is one of the most important things you can do for your health. ? Most adults should exercise for at least 150 minutes each week. The exercise should increase your heart rate and make you sweat (moderate-intensity  exercise). ? Most adults should also do strengthening exercises at least twice a week. This is in addition to the moderate-intensity exercise.  Maintain a healthy weight  Body mass index (BMI) is a measurement that can be used to identify possible weight problems. It estimates body fat based on height and weight. Your health care provider can help determine your BMI and help you achieve or maintain a healthy weight.  For females 63 years of age and older: ? A BMI below 18.5 is considered underweight. ? A BMI of 18.5  to 24.9 is normal. ? A BMI of 25 to 29.9 is considered overweight. ? A BMI of 30 and above is considered obese.  Watch levels of cholesterol and blood lipids  You should start having your blood tested for lipids and cholesterol at 77 years of age, then have this test every 5 years.  You may need to have your cholesterol levels checked more often if: ? Your lipid or cholesterol levels are high. ? You are older than 77 years of age. ? You are at high risk for heart disease.  Cancer screening Lung Cancer  Lung cancer screening is recommended for adults 37-49 years old who are at high risk for lung cancer because of a history of smoking.  A yearly low-dose CT scan of the lungs is recommended for people who: ? Currently smoke. ? Have quit within the past 15 years. ? Have at least a 30-pack-year history of smoking. A pack year is smoking an average of one pack of cigarettes a day for 1 year.  Yearly screening should continue until it has been 15 years since you quit.  Yearly screening should stop if you develop a health problem that would prevent you from having lung cancer treatment.  Breast Cancer  Practice breast self-awareness. This means understanding how your breasts normally appear and feel.  It also means doing regular breast self-exams. Let your health care provider know about any changes, no matter how small.  If you are in your 20s or 30s, you should have a  clinical breast exam (CBE) by a health care provider every 1-3 years as part of a regular health exam.  If you are 77 or older, have a CBE every year. Also consider having a breast X-ray (mammogram) every year.  If you have a family history of breast cancer, talk to your health care provider about genetic screening.  If you are at high risk for breast cancer, talk to your health care provider about having an MRI and a mammogram every year.  Breast cancer gene (BRCA) assessment is recommended for women who have family members with BRCA-related cancers. BRCA-related cancers include: ? Breast. ? Ovarian. ? Tubal. ? Peritoneal cancers.  Results of the assessment will determine the need for genetic counseling and BRCA1 and BRCA2 testing.  Cervical Cancer Your health care provider may recommend that you be screened regularly for cancer of the pelvic organs (ovaries, uterus, and vagina). This screening involves a pelvic examination, including checking for microscopic changes to the surface of your cervix (Pap test). You may be encouraged to have this screening done every 3 years, beginning at age 63.  For women ages 34-65, health care providers may recommend pelvic exams and Pap testing every 3 years, or they may recommend the Pap and pelvic exam, combined with testing for human papilloma virus (HPV), every 5 years. Some types of HPV increase your risk of cervical cancer. Testing for HPV may also be done on women of any age with unclear Pap test results.  Other health care providers may not recommend any screening for nonpregnant women who are considered low risk for pelvic cancer and who do not have symptoms. Ask your health care provider if a screening pelvic exam is right for you.  If you have had past treatment for cervical cancer or a condition that could lead to cancer, you need Pap tests and screening for cancer for at least 20 years after your treatment. If Pap tests have been discontinued,  your risk  factors (such as having a new sexual partner) need to be reassessed to determine if screening should resume. Some women have medical problems that increase the chance of getting cervical cancer. In these cases, your health care provider may recommend more frequent screening and Pap tests.  Colorectal Cancer  This type of cancer can be detected and often prevented.  Routine colorectal cancer screening usually begins at 77 years of age and continues through 77 years of age.  Your health care provider may recommend screening at an earlier age if you have risk factors for colon cancer.  Your health care provider may also recommend using home test kits to check for hidden blood in the stool.  A small camera at the end of a tube can be used to examine your colon directly (sigmoidoscopy or colonoscopy). This is done to check for the earliest forms of colorectal cancer.  Routine screening usually begins at age 44.  Direct examination of the colon should be repeated every 5-10 years through 77 years of age. However, you may need to be screened more often if early forms of precancerous polyps or small growths are found.  Skin Cancer  Check your skin from head to toe regularly.  Tell your health care provider about any new moles or changes in moles, especially if there is a change in a mole's shape or color.  Also tell your health care provider if you have a mole that is larger than the size of a pencil eraser.  Always use sunscreen. Apply sunscreen liberally and repeatedly throughout the day.  Protect yourself by wearing long sleeves, pants, a wide-brimmed hat, and sunglasses whenever you are outside.  Heart disease, diabetes, and high blood pressure  High blood pressure causes heart disease and increases the risk of stroke. High blood pressure is more likely to develop in: ? People who have blood pressure in the high end of the normal range (130-139/85-89 mm Hg). ? People who are  overweight or obese. ? People who are African American.  If you are 72-51 years of age, have your blood pressure checked every 3-5 years. If you are 38 years of age or older, have your blood pressure checked every year. You should have your blood pressure measured twice-once when you are at a hospital or clinic, and once when you are not at a hospital or clinic. Record the average of the two measurements. To check your blood pressure when you are not at a hospital or clinic, you can use: ? An automated blood pressure machine at a pharmacy. ? A home blood pressure monitor.  If you are between 74 years and 20 years old, ask your health care provider if you should take aspirin to prevent strokes.  Have regular diabetes screenings. This involves taking a blood sample to check your fasting blood sugar level. ? If you are at a normal weight and have a low risk for diabetes, have this test once every three years after 77 years of age. ? If you are overweight and have a high risk for diabetes, consider being tested at a younger age or more often. Preventing infection Hepatitis B  If you have a higher risk for hepatitis B, you should be screened for this virus. You are considered at high risk for hepatitis B if: ? You were born in a country where hepatitis B is common. Ask your health care provider which countries are considered high risk. ? Your parents were born in a high-risk country, and  you have not been immunized against hepatitis B (hepatitis B vaccine). ? You have HIV or AIDS. ? You use needles to inject street drugs. ? You live with someone who has hepatitis B. ? You have had sex with someone who has hepatitis B. ? You get hemodialysis treatment. ? You take certain medicines for conditions, including cancer, organ transplantation, and autoimmune conditions.  Hepatitis C  Blood testing is recommended for: ? Everyone born from [birth year] through 1965. ? Anyone with known risk factors for  hepatitis C.  Sexually transmitted infections (STIs)  You should be screened for sexually transmitted infections (STIs) including gonorrhea and chlamydia if: ? You are sexually active and are younger than 77 years of age. ? You are older than 77 years of age and your health care provider tells you that you are at risk for this type of infection. ? Your sexual activity has changed since you were last screened and you are at an increased risk for chlamydia or gonorrhea. Ask your health care provider if you are at risk.  If you do not have HIV, but are at risk, it may be recommended that you take a prescription medicine daily to prevent HIV infection. This is called pre-exposure prophylaxis (PrEP). You are considered at risk if: ? You are sexually active and do not regularly use condoms or know the HIV status of your partner(s). ? You take drugs by injection. ? You are sexually active with a partner who has HIV.  Talk with your health care provider about whether you are at high risk of being infected with HIV. If you choose to begin PrEP, you should first be tested for HIV. You should then be tested every 3 months for as long as you are taking PrEP. Pregnancy  If you are premenopausal and you may become pregnant, ask your health care provider about preconception counseling.  If you may become pregnant, take 400 to 800 micrograms (mcg) of folic acid every day.  If you want to prevent pregnancy, talk to your health care provider about birth control (contraception). Osteoporosis and menopause  Osteoporosis is a disease in which the bones lose minerals and strength with aging. This can result in serious bone fractures. Your risk for osteoporosis can be identified using a bone density scan.  If you are 65 years of age or older, or if you are at risk for osteoporosis and fractures, ask your health care provider if you should be screened.  Ask your health care provider whether you should take a  calcium or vitamin D supplement to lower your risk for osteoporosis.  Menopause may have certain physical symptoms and risks.  Hormone replacement therapy may reduce some of these symptoms and risks. Talk to your health care provider about whether hormone replacement therapy is right for you. Follow these instructions at home:  Schedule regular health, dental, and eye exams.  Stay current with your immunizations.  Do not use any tobacco products including cigarettes, chewing tobacco, or electronic cigarettes.  If you are pregnant, do not drink alcohol.  If you are breastfeeding, limit how much and how often you drink alcohol.  Limit alcohol intake to no more than 1 drink per day for nonpregnant women. One drink equals 12 ounces of beer, 5 ounces of wine, or 1 ounces of hard liquor.  Do not use street drugs.  Do not share needles.  Ask your health care provider for help if you need support or information about quitting drugs.  Tell  your health care provider if you often feel depressed.  Tell your health care provider if you have ever been abused or do not feel safe at home. This information is not intended to replace advice given to you by your health care provider. Make sure you discuss any questions you have with your health care provider. Document Released: 06/23/2011 Document Revised: 05/15/2016 Document Reviewed: 09/11/2015 Elsevier Interactive Patient Education  [employer].

## 2018-01-14 NOTE — Assessment & Plan Note (Signed)
Pt's PE WNL.  UTD on colonoscopy.  Order entered for mammo and DEXA.  Pneumovax given today.  Check labs.  Anticipatory guidance provided.

## 2018-01-15 ENCOUNTER — Ambulatory Visit (INDEPENDENT_AMBULATORY_CARE_PROVIDER_SITE_OTHER): Payer: Medicare HMO

## 2018-01-15 DIAGNOSIS — I4891 Unspecified atrial fibrillation: Secondary | ICD-10-CM | POA: Diagnosis not present

## 2018-01-15 DIAGNOSIS — Z5181 Encounter for therapeutic drug level monitoring: Secondary | ICD-10-CM | POA: Diagnosis not present

## 2018-01-15 LAB — POCT INR: INR: 2.7

## 2018-01-15 NOTE — Patient Instructions (Signed)
Description   Continue on same dosage 1 tablet (5mg )  daily except 1 and 1/2 tablets (7.5mg )  on Mondays, Wednesdays  and Fridays. Recheck in 1 week.  Call [facility] Clinic with any new meds or procedures: Anticoagulation Clinic [phone] Main [phone]

## 2018-01-21 ENCOUNTER — Other Ambulatory Visit: Payer: Self-pay | Admitting: Cardiovascular Disease

## 2018-01-22 ENCOUNTER — Ambulatory Visit (INDEPENDENT_AMBULATORY_CARE_PROVIDER_SITE_OTHER): Payer: Medicare HMO | Admitting: Pharmacist

## 2018-01-22 DIAGNOSIS — I4891 Unspecified atrial fibrillation: Secondary | ICD-10-CM

## 2018-01-22 DIAGNOSIS — Z5181 Encounter for therapeutic drug level monitoring: Secondary | ICD-10-CM

## 2018-01-22 LAB — POCT INR: INR: 2.8

## 2018-01-22 NOTE — Patient Instructions (Signed)
Description   Continue on same dosage 1 tablet (5mg )  daily except 1 and 1/2 tablets (7.5mg )  on Mondays, Wednesdays  and Fridays. Recheck in 1 week.  Call Coumadin Clinic with any new meds or procedures: Anticoagulation Clinic [phone] Main [phone]

## 2018-01-29 ENCOUNTER — Ambulatory Visit (INDEPENDENT_AMBULATORY_CARE_PROVIDER_SITE_OTHER): Payer: Medicare HMO | Admitting: Pharmacist

## 2018-01-29 DIAGNOSIS — Z5181 Encounter for therapeutic drug level monitoring: Secondary | ICD-10-CM | POA: Diagnosis not present

## 2018-01-29 DIAGNOSIS — I4891 Unspecified atrial fibrillation: Secondary | ICD-10-CM

## 2018-01-29 LAB — POCT INR: INR: 2.4

## 2018-01-29 NOTE — Patient Instructions (Signed)
Description   Continue on same dosage 1 tablet (5mg )  daily except 1 and 1/2 tablets (7.5mg )  on Mondays, Wednesdays  and Fridays. Recheck in 2 weeks.  Call [facility]madin Clinic with any new meds or procedures: Anticoagulation Clinic [phone] Main [phone]

## 2018-02-15 ENCOUNTER — Ambulatory Visit
Admission: RE | Admit: 2018-02-15 | Discharge: 2018-02-15 | Disposition: A | Discharge status: Home / Self Care | Payer: Medicare HMO | Source: Ambulatory Visit | Attending: Family Medicine | Admitting: Family Medicine

## 2018-02-15 DIAGNOSIS — Z1239 Encounter for other screening for malignant neoplasm of breast: Secondary | ICD-10-CM

## 2018-02-15 DIAGNOSIS — M8580 Other specified disorders of bone density and structure, unspecified site: Secondary | ICD-10-CM

## 2018-02-15 DIAGNOSIS — Z1231 Encounter for screening mammogram for malignant neoplasm of breast: Secondary | ICD-10-CM | POA: Diagnosis not present

## 2018-02-16 ENCOUNTER — Ambulatory Visit (INDEPENDENT_AMBULATORY_CARE_PROVIDER_SITE_OTHER): Payer: Medicare HMO | Admitting: *Deleted

## 2018-02-16 DIAGNOSIS — I4891 Unspecified atrial fibrillation: Secondary | ICD-10-CM | POA: Diagnosis not present

## 2018-02-16 DIAGNOSIS — Z5181 Encounter for therapeutic drug level monitoring: Secondary | ICD-10-CM

## 2018-02-16 LAB — POCT INR: INR: 2.6

## 2018-02-16 NOTE — Patient Instructions (Signed)
Description   Continue on same dosage 1 tablet (5mg )  daily except 1 and 1/2 tablets (7.5mg )  on Mondays, Wednesdays  and Fridays. Recheck in 4 weeks.  Call Coumadin Clinic with any new meds or procedures: Anticoagulation Clinic [phone] Main [phone]

## 2018-02-18 ENCOUNTER — Other Ambulatory Visit: Payer: Self-pay | Admitting: Cardiovascular Disease

## 2018-02-23 ENCOUNTER — Telehealth: Payer: Self-pay

## 2018-02-23 ENCOUNTER — Ambulatory Visit: Payer: Medicare HMO | Admitting: Gastroenterology

## 2018-02-23 ENCOUNTER — Encounter: Payer: Self-pay | Admitting: Gastroenterology

## 2018-02-23 VITALS — BP 130/70 | HR 60 | Ht 63.5 in | Wt 195.2 lb

## 2018-02-23 DIAGNOSIS — Z7901 Long term (current) use of anticoagulants: Secondary | ICD-10-CM

## 2018-02-23 DIAGNOSIS — K529 Noninfective gastroenteritis and colitis, unspecified: Secondary | ICD-10-CM

## 2018-02-23 DIAGNOSIS — I4891 Unspecified atrial fibrillation: Secondary | ICD-10-CM | POA: Diagnosis not present

## 2018-02-23 MED ORDER — PEG-KCL-NACL-NASULF-NA ASC-C 140 G PO SOLR: 140.0000 g | ORAL | 0 refills | Status: DC

## 2018-02-23 NOTE — Telephone Encounter (Signed)
02/23/2018   RE: EVANGELYNE LOJA DOB: October 11, 1941 MRN: [id number]   Dear Dr.Cooper,    We have scheduled the above patient for an endoscopic procedure. Our records show that she is on anticoagulation therapy.   Please advise as to how long the patient may come off her therapy of Coumadin prior to the procedure, which is scheduled for 03-24-2018.  Please route back to [place], [place].   Thank you,   Jovanka Westgate,CMA

## 2018-02-23 NOTE — Patient Instructions (Signed)
If you are age 77 or older, your body mass index should be between 23-30. Your Body mass index is 34.04 kg/m. If this is out of the aforementioned range listed, please consider follow up with your Primary Care Provider.  If you are age 65 or younger, your body mass index should be between 19-25. Your Body mass index is 34.04 kg/m. If this is out of the aformentioned range listed, please consider follow up with your Primary Care Provider.   You have been scheduled for a colonoscopy. Please follow written instructions given to you at your visit today.  Please pick up your prep supplies at the pharmacy within the next 1-3 days. If you use inhalers (even only as needed), please bring them with you on the day of your procedure. Your physician has requested that you go to www.startemmi.com and enter the access code given to you at your visit today. This web site gives a general overview about your procedure. However, you should still follow specific instructions given to you by our office regarding your preparation for the procedure.  Thank you for choosing [facility]  Dr Wilfrid Lund III

## 2018-02-23 NOTE — Progress Notes (Signed)
[facility] Consult Note:  History: Melanie [name] 02/23/2018  Referring physician: [name], MD  Reason for consult/chief complaint: Diarrhea (intermittent x 3 months) and Abdominal Cramping (intermittent)   Subjective  HPI:  This is a 77 yo woman referred by primary care for chronic diarrhea.  It has occurred intermittently for years without clear trigger.  It might bother her for weeks or months at a time and then get better without any other changes.  She has been on metformin at this stable dose for years, and does not seem to think it is related.  Last fall she was having regular formed bowel movements.  With this recent episode over the last couple of months there is been no travel, sick contacts or antibiotic use.  She denies rectal bleeding.  On rare occasion she will have some crampy abdominal pain before bowel movement, but for the most part has no chronic abdominal pain.  Most bothersome have been some episodes of nocturnal diarrhea will she will soil herself in her sleep without realizing it.  The occasional use of an over-the-counter antidiarrheal medicine will cause no bowel movement for 1-2 days afterwards.  She noticed no change when she stopped having milk. She was last seen by Dr. Deatra Ina in September 2016 for dysphagia.  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 Colonoscopy 08/18/2012 by Dr. Sharlett Iles revealed tortuous and redundant colon, diverticulosis, and rectal hyperplastic polyps.  I reviewed the primary care office note with Dr. Birdie Riddle on 01/14/2018, where she was describing the return of diarrhea that had apparently bothered her last summer.  ROS:  Review of Systems  Constitutional: Negative for appetite change and unexpected weight change.  HENT: Negative for mouth sores and voice change.   Eyes: Negative for pain and redness.  Respiratory: Negative for cough  and shortness of breath.   Cardiovascular: Negative for chest pain and palpitations.  Genitourinary: Negative for dysuria and hematuria.  Musculoskeletal: Positive for arthralgias. Negative for myalgias.  Skin: Negative for pallor and rash.  Neurological: Negative for weakness and headaches.  Hematological: Negative for adenopathy.     Past Medical History: Past Medical History:  Diagnosis Date  . Allergic rhinitis   . Arthritis   . Diabetes mellitus   . GERD (gastroesophageal reflux disease)   . Hyperlipidemia   . Hypertension   . Pain in joint, ankle and foot 09/14/2013     Past Surgical History: Past Surgical History:  Procedure Laterality Date  . FRACTURE SURGERY Left     leg  . hiatal hernia surgery  2010  . ROTATOR CUFF REPAIR Right 2010  . TONSILLECTOMY       Family History: Family History  Problem Relation Age of Onset  . Colon cancer Brother 41  . Colon cancer Maternal Aunt 9  . Breast cancer Paternal Grandmother 61  . Breast cancer Maternal Aunt 28  . Diabetes Maternal Aunt   . Heart disease Mother   . Stomach cancer Neg Hx    She is uncertain if brother really had colon cancer  Social History: Social History   Socioeconomic History  . Marital status: Widowed    Spouse name: None  . Number of children: 3  . Years of education: None  . Highest education level: None  Social Needs  . Financial resource strain: None  . Food insecurity - worry: None  . Food insecurity - inability: None  . Transportation needs -  medical: None  . Transportation needs - non-medical: None  Occupational History  . None  Tobacco Use  . Smoking status: Never Smoker  . Smokeless tobacco: Never Used  Substance and Sexual Activity  . Alcohol use: No  . Drug use: No  . Sexual activity: None  Other Topics Concern  . None  Social History Narrative  . None    Allergies: Allergies  Allergen Reactions  . Niacin And Related Rash  . Keflex [Cephalexin] Other (See  Comments)    Fatigue, sore throat, body aches  . Sausage [Pickled Meat] Other (See Comments)    Drowsiness and dizziness  . Tape Rash    Bandaids     Outpatient Meds: Current Outpatient Medications  Medication Sig Dispense Refill  . Ascorbic Acid (VITAMIN C) 1000 MG tablet Take 1,000 mg by mouth 2 (two) times daily.    . Calcium Carb-Cholecalciferol (CALTRATE 600+D3 SOFT PO) Take 1 tablet by mouth 2 (two) times daily.    [name] CRANBERRY PO Take 2 tablets by mouth daily.    . Cyanocobalamin (VITAMIN B-12) 1000 MCG SUBL Place 1 each under the tongue daily.    [name] diltiazem (CARDIZEM) 90 MG tablet Take 1 tablet (90 mg total) by mouth 2 (two) times daily. 180 tablet 1  . fish oil-omega-3 fatty acids 1000 MG capsule Take 2 g by mouth daily.    . fluticasone (FLONASE) 50 MCG/ACT nasal spray Place 2 sprays into both nostrils daily as needed for allergies or rhinitis.    . furosemide (LASIX) 20 MG tablet Take 1 tablet (20 mg total) by mouth daily. 90 tablet 1  . lisinopril-hydrochlorothiazide (PRINZIDE,ZESTORETIC) 20-12.5 MG tablet Take 1 tablet by mouth daily. 90 tablet 1  . metFORMIN (GLUCOPHAGE) 500 MG tablet Take 1 tablet (500 mg total) by mouth 2 (two) times daily with a meal. 180 tablet 1  . metoprolol tartrate (LOPRESSOR) 25 MG tablet Take 1 tablet (25 mg total) by mouth 2 (two) times daily. 180 tablet 1  . Probiotic Product (PROBIOTIC PO) Take 1 capsule by mouth daily.     . ranitidine (ZANTAC) 300 MG tablet Take 1 tablet (300 mg total) by mouth at bedtime. 90 tablet 1  . warfarin (COUMADIN) 5 MG tablet TAKE AS DIRECTED BY COUMADIN CLINIC 40 tablet 2  . PEG-KCl-NaCl-NaSulf-Na Asc-C (PLENVU) 140 g SOLR Take 140 g by mouth as directed. 1 each 0   No current facility-administered medications for this visit.       ___________________________________________________________________ Objective   Exam:  BP 130/70 (BP Location: Left Arm, Patient Position: Sitting, Cuff Size: Normal)   Pulse 60    Ht 5' 3.5" (1.613 m) Comment: height measured without shoes  Wt 195 lb 4 oz (88.6 kg)   BMI 34.04 kg/m    General: this is a(n) well-appearing elderly woman, no muscle wasting  Eyes: sclera anicteric, no redness  ENT: oral mucosa moist without lesions, no cervical or supraclavicular lymphadenopathy, good dentition  CV: RRR without murmur, S1/S2, no JVD, mild peripheral edema  Resp: clear to auscultation bilaterally, normal RR and effort noted  GI: soft, no tenderness, with active bowel sounds. No guarding or palpable organomegaly noted.  Skin; warm and dry, no rash or jaundice noted  Neuro: awake, alert and oriented x 3. Normal gross motor function and fluent speech  Labs:  CMP Latest Ref Rng & Units 01/14/2018 11/20/2017 09/16/2017  Glucose 70 - 99 mg/dL 135(H) 106(H) 118(H)  BUN 6 - 23 mg/dL 14 14 14  Creatinine 0.40 - 1.20 mg/dL 0.91 1.05 0.94  Sodium 135 - 145 mEq/L 137 139 139  Potassium 3.5 - 5.1 mEq/L 3.9 3.8 4.1  Chloride 96 - 112 mEq/L 99 101 103  CO2 19 - 32 mEq/L 32 32 30  Calcium 8.4 - 10.5 mg/dL 9.1 9.4 9.4  Total Protein 6.0 - 8.3 g/dL 6.9 6.8 6.6  Total Bilirubin 0.2 - 1.2 mg/dL 0.5 0.7 0.6  Alkaline Phos 39 - 117 U/L 59 53 51  AST 0 - 37 U/L 11 14 10   ALT 0 - 35 U/L 10 10 8    CBC Latest Ref Rng & Units 01/14/2018 09/16/2017 05/06/2017  WBC 4.0 - 10.5 K/uL 6.0 6.9 6.8  Hemoglobin 12.0 - 15.0 g/dL 13.5 13.1 13.5  Hematocrit 36.0 - 46.0 % 40.5 39.1 40.1  Platelets 150.0 - 400.0 K/uL 208.0 247.0 204.0   No stool studies performed   Recent TSH normal Hgb A1c 7.1  Radiologic Studies:  Echo 01/2017 - LVEf 55-60%  Assessment: Encounter Diagnoses  Name Primary?  . Chronic diarrhea Yes  . Atrial fibrillation, unspecified type ([place])   . Chronic anticoagulation     It does not sound small bowel malabsorptive.  It sounds most like microscopic colitis.  She has no risk factors for chronic pancreatitis or bacterial overgrowth.  It also seems unlikely to be  a side effect of metformin.  None of her other medicines seem likely to be the cause either.  Plan:  Colonoscopy with biopsies to rule out microscopic colitis.  Patient is agreeable after discussion of procedure and risks.  The benefits and risks of the planned procedure were described in detail with the patient or (when appropriate) their health care proxy.  Risks were outlined as including, but not limited to, bleeding, infection, perforation, adverse medication reaction leading to cardiac or pulmonary decompensation, or pancreatitis (if ERCP).  The limitation of incomplete mucosal visualization was also discussed.  No guarantees or warranties were given.  No Coumadin 4 days prior to procedure, we will clear this with cardiology.  Thank you for the courtesy of this consult.  Please call me with any questions or concerns.  Nelida Meuse III  CC: [name], MD

## 2018-02-24 NOTE — Telephone Encounter (Signed)
   Primary Cardiologist: Sherren Mocha, MD  Chart reviewed as part of pre-operative protocol coverage. Will route to pharm for input on Coumadin then will need to review for clearance.  Charlie Pitter, PA-C 02/24/2018, 3:24 PM

## 2018-02-24 NOTE — Telephone Encounter (Signed)
Patient with diagnosis of Afib on coumadin for anticoagulation.    Procedure: colonoscopy Date of procedure: 03/24/18  CHADS2-VASc score of  5 (CHF, HTN, AGE, DM2, stroke/tia x 2, CAD, AGE, female)  Per office protocol, patient can hold warfarin for 5 days prior to procedure.   Patient will not need bridging with Lovenox (enoxaparin) around procedure.

## 2018-02-25 NOTE — Telephone Encounter (Signed)
Pt has been notified and aware. She states understanding and will mark her calendar as reminder.

## 2018-02-25 NOTE — Telephone Encounter (Signed)
Clearance routed to Magdalene River, CMA in GI as requested

## 2018-02-25 NOTE — Telephone Encounter (Signed)
   Primary Cardiologist: Sherren Mocha, MD  Chart reviewed as part of pre-operative protocol coverage. Patient was contacted 02/25/2018 in reference to pre-operative risk assessment for pending surgery as outlined below.  Melanie [name] was last seen on 10/28/2017 by Roderic Palau.  Since that day, Melanie [name] has done well without any shortness of breath or exertional chest pain.  Therefore, based on ACC/AHA guidelines, the patient would be at acceptable risk for the planned procedure without further cardiovascular testing.   I will route this recommendation to the requesting party via Epic fax function and remove from pre-op pool.  Please call with questions. As per review by our clinical pharmacist, will need to hold coumadin for 5 days prior to the colonoscopy and restart as soon as possible after the procedure and followup with coumadin clinic within 5 days after the procedure to monitor the coumadin level.   [place], [place] 02/25/2018, 2:08 PM

## 2018-03-05 ENCOUNTER — Ambulatory Visit: Payer: Medicare HMO | Admitting: Family Medicine

## 2018-03-05 DIAGNOSIS — Z0289 Encounter for other administrative examinations: Secondary | ICD-10-CM

## 2018-03-10 ENCOUNTER — Encounter: Payer: Self-pay | Admitting: Gastroenterology

## 2018-03-14 ENCOUNTER — Other Ambulatory Visit: Payer: Self-pay | Admitting: Family Medicine

## 2018-03-16 ENCOUNTER — Ambulatory Visit (INDEPENDENT_AMBULATORY_CARE_PROVIDER_SITE_OTHER): Payer: Medicare HMO | Admitting: *Deleted

## 2018-03-16 DIAGNOSIS — Z5181 Encounter for therapeutic drug level monitoring: Secondary | ICD-10-CM

## 2018-03-16 DIAGNOSIS — I4891 Unspecified atrial fibrillation: Secondary | ICD-10-CM

## 2018-03-16 LAB — POCT INR: INR: 2.1

## 2018-03-16 NOTE — Patient Instructions (Addendum)
03/18/18- Last dose of Coumadin.  Then start holding for procedure on 03/24/18, You will restart when you MD approves to do so & when you restart your Coumadin take an extra 1/2 tablet for 2 days then resume normal dose.   Description   Continue on same dosage 1 tablet (5mg )  daily except 1 and 1/2 tablets (7.5mg )  on Mondays, Wednesdays and Fridays. Last dose on 03/18/18 for procedure on 03/24/18. Recheck in 1 week after procedure.  Call Coumadin Clinic with any new meds or procedures: Anticoagulation Clinic [phone] Main [phone]

## 2018-03-24 ENCOUNTER — Ambulatory Visit (AMBULATORY_SURGERY_CENTER): Payer: Medicare HMO | Admitting: Gastroenterology

## 2018-03-24 ENCOUNTER — Other Ambulatory Visit: Payer: Self-pay

## 2018-03-24 ENCOUNTER — Encounter: Payer: Self-pay | Admitting: Gastroenterology

## 2018-03-24 VITALS — BP 130/51 | HR 58 | Temp 97.3°F | Resp 13 | Ht 63.0 in | Wt 195.0 lb

## 2018-03-24 DIAGNOSIS — D122 Benign neoplasm of ascending colon: Secondary | ICD-10-CM

## 2018-03-24 DIAGNOSIS — D125 Benign neoplasm of sigmoid colon: Secondary | ICD-10-CM

## 2018-03-24 DIAGNOSIS — K529 Noninfective gastroenteritis and colitis, unspecified: Secondary | ICD-10-CM | POA: Diagnosis present

## 2018-03-24 DIAGNOSIS — R197 Diarrhea, unspecified: Secondary | ICD-10-CM | POA: Diagnosis not present

## 2018-03-24 DIAGNOSIS — D124 Benign neoplasm of descending colon: Secondary | ICD-10-CM

## 2018-03-24 DIAGNOSIS — Z1211 Encounter for screening for malignant neoplasm of colon: Secondary | ICD-10-CM | POA: Diagnosis not present

## 2018-03-24 MED ORDER — SODIUM CHLORIDE 0.9 % IV SOLN: 500.0000 mL | Freq: Once | INTRAVENOUS | Status: DC

## 2018-03-24 NOTE — Progress Notes (Signed)
Pt's states no medical or surgical changes since previsit or office visit. 

## 2018-03-24 NOTE — Progress Notes (Signed)
A and O x3. Report to RN. Tolerated MAC anesthesia well.

## 2018-03-24 NOTE — Op Note (Signed)
Melanie [name] Patient Name: Melanie [name] Procedure Date: 03/24/2018 1:35 PM MRN: [id number] Endoscopist: Mallie Mussel L. Loletha Carrow , MD Age: 77 Referring MD:  Date of Birth: July 20, 1941 Gender: Female Account #: [id number] Procedure:                Colonoscopy Indications:              Chronic diarrhea Medicines:                Monitored Anesthesia Care Procedure:                Pre-Anesthesia Assessment:                           - Prior to the procedure, a History and Physical                            was performed, and patient medications and                            allergies were reviewed. The patient's tolerance of                            previous anesthesia was also reviewed. The risks                            and benefits of the procedure and the sedation                            options and risks were discussed with the patient.                            All questions were answered, and informed consent                            was obtained. Prior Anticoagulants: The patient has                            taken Coumadin (warfarin), last dose was 4 days                            prior to procedure. ASA Grade Assessment: III - A                            patient with severe systemic disease. After                            reviewing the risks and benefits, the patient was                            deemed in satisfactory condition to undergo the                            procedure.  After obtaining informed consent, the colonoscope                            was passed under direct vision. Throughout the                            procedure, the patient's blood pressure, pulse, and                            oxygen saturations were monitored continuously. The                            Model CF-HQ190L [id number]) scope was introduced                            through the anus and advanced to the the cecum,   identified by appendiceal orifice and ileocecal                            valve. The colonoscopy was performed with                            difficulty due to a redundant colon, significant                            looping and a tortuous colon. Successful completion                            of the procedure was aided by using manual                            pressure. The patient tolerated the procedure well.                            The quality of the bowel preparation was good. The                            ileocecal valve, appendiceal orifice, and rectum                            were photographed. The quality of the bowel                            preparation was evaluated using the BBPS [facility]                            Bowel Preparation Scale) with scores of: Right                            Colon = 2, Transverse Colon = 2 and Left Colon = 2.                            The total BBPS score equals 6. Scope In: 1:39:58  PM Scope Out: 2:02:47 PM Scope Withdrawal Time: 0 hours 13 minutes 37 seconds  Total Procedure Duration: 0 hours 22 minutes 49 seconds  Findings:                 The digital rectal exam findings include decreased                            sphincter tone.                           The terminal ileum could not be intubated due to                            redundant colon and scope looping.                           Multiple diverticula were found in the left colon.                           The colon (entire examined portion) was redundant                            and tortuous.                           Four sessile polyps were found in the distal                            sigmoid colon, proximal descending colon and                            ascending colon. The polyps were 4 to 6 mm in size.                            These polyps were removed with a cold snare.                            Resection and retrieval were complete.                            Normal mucosa was found in the entire colon.                            Biopsies for histology were taken with a cold                            forceps from the right colon and left colon for                            evaluation of microscopic colitis.                           The exam was otherwise without abnormality on  direct and retroflexion views. Complications:            No immediate complications. Estimated Blood Loss:     Estimated blood loss was minimal. Impression:               - Decreased sphincter tone found on digital rectal                            exam.                           - Diverticulosis in the left colon.                           - Redundant colon.                           - Four 4 to 6 mm polyps in the distal sigmoid                            colon, in the proximal descending colon and in the                            ascending colon, removed with a cold snare.                            Resected and retrieved.                           - Normal mucosa in the entire examined colon.                            Biopsied.                           - The examination was otherwise normal on direct                            and retroflexion views. Recommendation:           - Patient has a contact number available for                            emergencies. The signs and symptoms of potential                            delayed complications were discussed with the                            patient. Return to normal activities tomorrow.                            Written discharge instructions were provided to the                            patient.                           -  Resume previous diet.                           - Resume Coumadin (warfarin) at prior dose tomorrow.                           - Await pathology results.                           - Repeat colonoscopy is recommended for                             surveillance. The colonoscopy date will be                            determined after pathology results from today's                            exam become available for review. Melanie [name] L. Loletha Carrow, MD 03/24/2018 2:10:01 PM This report has been signed electronically.

## 2018-03-24 NOTE — Patient Instructions (Signed)
YOU HAD AN ENDOSCOPIC PROCEDURE TODAY AT [facility]:   Refer to the procedure report that was given to you for any specific questions about what was found during the examination.  If the procedure report does not answer your questions, please call your gastroenterologist to clarify.  If you requested that your care partner not be given the details of your procedure findings, then the procedure report has been included in a sealed envelope for you to review at your convenience later.  YOU SHOULD EXPECT: Some feelings of bloating in the abdomen. Passage of more gas than usual.  Walking can help get rid of the air that was put into your GI tract during the procedure and reduce the bloating. If you had a lower endoscopy (such as a colonoscopy or flexible sigmoidoscopy) you may notice spotting of blood in your stool or on the toilet paper. If you underwent a bowel prep for your procedure, you may not have a normal bowel movement for a few days.  Please Note:  You might notice some irritation and congestion in your nose or some drainage.  This is from the oxygen used during your procedure.  There is no need for concern and it should clear up in a day or so.  SYMPTOMS TO REPORT IMMEDIATELY:   Following lower endoscopy (colonoscopy or flexible sigmoidoscopy):  Excessive amounts of blood in the stool  Significant tenderness or worsening of abdominal pains  Swelling of the abdomen that is new, acute  Fever of 100F or higher  For urgent or emergent issues, a gastroenterologist can be reached at any hour by calling [phone].  DIET:  We do recommend a small meal at first, but then you may proceed to your regular diet.  Drink plenty of fluids but you should avoid alcoholic beverages for 24 hours.  ACTIVITY:  You should plan to take it easy for the rest of today and you should NOT DRIVE or use heavy machinery until tomorrow (because of the sedation medicines used during the test).     FOLLOW UP: Our staff will call the number listed on your records the next business day following your procedure to check on you and address any questions or concerns that you may have regarding the information given to you following your procedure. If we do not reach you, we will leave a message.  However, if you are feeling well and you are not experiencing any problems, there is no need to return our call.  We will assume that you have returned to your regular daily activities without incident.  If any biopsies were taken you will be contacted by phone or by letter within the next 1-3 weeks.  Please call us at [phone] if you have not heard about the biopsies in 3 weeks.   SIGNATURES/CONFIDENTIALITY: You and/or your care partner have signed paperwork which will be entered into your electronic medical record.  These signatures attest to the fact that that the information above on your After Visit Summary has been reviewed and is understood.  Full responsibility of the confidentiality of this discharge information lies with you and/or your care-partner.  RESUME COUMADIN TOMORROW  Continue your normal medications  Your blood sugar in the recovery room was 123  Please read over handouts about polyps and diverticulosis

## 2018-03-24 NOTE — Progress Notes (Signed)
Called to room to assist during endoscopic procedure.  Patient ID and intended procedure confirmed with present staff. Received instructions for my participation in the procedure from the performing physician.  

## 2018-03-25 ENCOUNTER — Telehealth: Payer: Self-pay

## 2018-03-25 NOTE — Telephone Encounter (Signed)
Attempted to reach pt. With follow up call following endoscopic procedure 03/24/2018.  LM on pt. Ans. Machine.  Will try to reach pt. Again later today.

## 2018-03-25 NOTE — Telephone Encounter (Signed)
NO ANSWER, MESSAGE LEFT FOR PATIENT. 

## 2018-03-31 ENCOUNTER — Ambulatory Visit: Payer: Medicare HMO | Admitting: *Deleted

## 2018-03-31 DIAGNOSIS — I4891 Unspecified atrial fibrillation: Secondary | ICD-10-CM | POA: Diagnosis not present

## 2018-03-31 DIAGNOSIS — Z5181 Encounter for therapeutic drug level monitoring: Secondary | ICD-10-CM

## 2018-03-31 LAB — POCT INR: INR: 1.8

## 2018-03-31 NOTE — Patient Instructions (Signed)
Description   Today take 2 tablets then continue on same dosage 1 tablet (5mg ) daily except 1 and 1/2 tablets (7.5mg ) on Mondays, Wednesdays and Fridays. Recheck in 2 weeks.  Call Coumadin Clinic with any new meds or procedures: Anticoagulation Clinic [phone] Main [phone]

## 2018-04-02 DIAGNOSIS — Z6834 Body mass index (BMI) 34.0-34.9, adult: Secondary | ICD-10-CM | POA: Diagnosis not present

## 2018-04-02 DIAGNOSIS — K08109 Complete loss of teeth, unspecified cause, unspecified class: Secondary | ICD-10-CM | POA: Diagnosis not present

## 2018-04-02 DIAGNOSIS — Z7722 Contact with and (suspected) exposure to environmental tobacco smoke (acute) (chronic): Secondary | ICD-10-CM | POA: Diagnosis not present

## 2018-04-02 DIAGNOSIS — K219 Gastro-esophageal reflux disease without esophagitis: Secondary | ICD-10-CM | POA: Diagnosis not present

## 2018-04-02 DIAGNOSIS — K5900 Constipation, unspecified: Secondary | ICD-10-CM | POA: Diagnosis not present

## 2018-04-02 DIAGNOSIS — I509 Heart failure, unspecified: Secondary | ICD-10-CM | POA: Diagnosis not present

## 2018-04-02 DIAGNOSIS — I110 Hypertensive heart disease with heart failure: Secondary | ICD-10-CM | POA: Diagnosis not present

## 2018-04-02 DIAGNOSIS — E119 Type 2 diabetes mellitus without complications: Secondary | ICD-10-CM | POA: Diagnosis not present

## 2018-04-02 DIAGNOSIS — I4891 Unspecified atrial fibrillation: Secondary | ICD-10-CM | POA: Diagnosis not present

## 2018-04-02 DIAGNOSIS — E669 Obesity, unspecified: Secondary | ICD-10-CM | POA: Diagnosis not present

## 2018-04-06 ENCOUNTER — Encounter: Payer: Self-pay | Admitting: Gastroenterology

## 2018-04-06 ENCOUNTER — Other Ambulatory Visit: Payer: Self-pay

## 2018-04-06 DIAGNOSIS — R197 Diarrhea, unspecified: Secondary | ICD-10-CM

## 2018-04-09 DIAGNOSIS — L718 Other rosacea: Secondary | ICD-10-CM | POA: Diagnosis not present

## 2018-04-09 DIAGNOSIS — L820 Inflamed seborrheic keratosis: Secondary | ICD-10-CM | POA: Diagnosis not present

## 2018-04-15 ENCOUNTER — Ambulatory Visit: Payer: Medicare HMO | Admitting: *Deleted

## 2018-04-15 ENCOUNTER — Other Ambulatory Visit: Payer: Medicare HMO

## 2018-04-15 DIAGNOSIS — I4891 Unspecified atrial fibrillation: Secondary | ICD-10-CM | POA: Diagnosis not present

## 2018-04-15 DIAGNOSIS — Z5181 Encounter for therapeutic drug level monitoring: Secondary | ICD-10-CM | POA: Diagnosis not present

## 2018-04-15 LAB — POCT INR: INR: 2.4

## 2018-04-15 NOTE — Patient Instructions (Signed)
Description   Continue on same dosage 1 tablet (5mg ) daily except 1 and 1/2 tablets (7.5mg ) on Mondays, Wednesdays and Fridays. Recheck in 3 weeks.  Call Coumadin Clinic with any new meds or procedures: Anticoagulation Clinic [phone] Main [phone]

## 2018-04-16 ENCOUNTER — Other Ambulatory Visit: Payer: Medicare HMO

## 2018-04-16 DIAGNOSIS — R197 Diarrhea, unspecified: Secondary | ICD-10-CM | POA: Diagnosis not present

## 2018-04-19 LAB — OVA AND PARASITE EXAMINATION
MICRO NUMBER:: 90512336
SPECIMEN QUALITY:: ADEQUATE

## 2018-04-20 LAB — CLOSTRIDIUM DIFFICILE BY PCR: Toxigenic C. Difficile by PCR: NEGATIVE

## 2018-04-23 LAB — PANCREATIC ELASTASE, FECAL

## 2018-04-26 ENCOUNTER — Ambulatory Visit: Payer: Medicare HMO | Admitting: Family Medicine

## 2018-04-27 ENCOUNTER — Other Ambulatory Visit: Payer: Self-pay

## 2018-04-27 MED ORDER — METRONIDAZOLE 500 MG PO TABS: 500.0000 mg | ORAL_TABLET | Freq: Three times a day (TID) | ORAL | 0 refills | Status: DC

## 2018-04-30 ENCOUNTER — Other Ambulatory Visit: Payer: Self-pay

## 2018-04-30 ENCOUNTER — Ambulatory Visit: Payer: Medicare HMO | Admitting: *Deleted

## 2018-04-30 ENCOUNTER — Encounter: Payer: Self-pay | Admitting: Family Medicine

## 2018-04-30 ENCOUNTER — Ambulatory Visit (INDEPENDENT_AMBULATORY_CARE_PROVIDER_SITE_OTHER): Payer: Medicare HMO | Admitting: Family Medicine

## 2018-04-30 VITALS — BP 118/70 | HR 58 | Temp 97.5°F | Resp 15 | Ht 63.5 in | Wt 190.8 lb

## 2018-04-30 DIAGNOSIS — Z5181 Encounter for therapeutic drug level monitoring: Secondary | ICD-10-CM

## 2018-04-30 DIAGNOSIS — E1142 Type 2 diabetes mellitus with diabetic polyneuropathy: Secondary | ICD-10-CM

## 2018-04-30 DIAGNOSIS — B351 Tinea unguium: Secondary | ICD-10-CM

## 2018-04-30 DIAGNOSIS — I4891 Unspecified atrial fibrillation: Secondary | ICD-10-CM

## 2018-04-30 LAB — POCT INR: INR: 3.7

## 2018-04-30 LAB — BASIC METABOLIC PANEL
BUN: 17 mg/dL (ref 6–23)
CO2: 28 meq/L (ref 19–32)
Calcium: 9.1 mg/dL (ref 8.4–10.5)
Chloride: 102 mEq/L (ref 96–112)
Creatinine, Ser: 1.01 mg/dL (ref 0.40–1.20)
GFR: 56.52 mL/min — AB (ref >60.00)
GLUCOSE: 99 mg/dL (ref 70–99)
POTASSIUM: 3.6 meq/L (ref 3.5–5.1)
SODIUM: 139 meq/L (ref 135–145)

## 2018-04-30 LAB — HEMOGLOBIN A1C: HEMOGLOBIN A1C: 6.8 % — AB (ref 4.6–6.5)

## 2018-04-30 NOTE — Assessment & Plan Note (Addendum)
Chronic problem.  Tolerating Metformin w/o difficulty (chronic diarrhea but there is another cause and being tx'd w/ Flagyl).  UTD on eye exam.  Foot exam done today and podiatry referral placed.  Stressed need for healthy diet and regular exercise.  Check labs.  Adjust meds prn

## 2018-04-30 NOTE — Patient Instructions (Signed)
Description   Skip today's dose, then take 1 tablet daily while on Flagyl.  Recheck in 1 week.  Call Coumadin Clinic with any new meds or procedures: Anticoagulation Clinic [phone] Main [phone]

## 2018-04-30 NOTE — Patient Instructions (Signed)
Follow up in 3-4 months to recheck BP, cholesterol, and diabetes We'll notify you of your lab results and make any changes if needed Continue to work on healthy diet and regular exercise- you're doing great! Call with any questions or concerns Have a great weekend!!!

## 2018-04-30 NOTE — Progress Notes (Signed)
   Subjective:    Patient ID: Melanie [name], female    DOB: Feb 22, 1941, 77 y.o.   MRN: [id number]  HPI DM- chronic problem, on Metformin 500mg  BID.  On ACE for renal protection.  UTD on eye exam.  Due for foot exam.  Pt has refused statin in the past.  Pt is down 5 lbs since last visit.  Walking regularly.  Denies symptomatic lows.  No CP, SOB, HAs, visual changes.  + numbness/tingling of hands feet.  Pt reports bilateral thickened and yellowing nails   Review of Systems For ROS see HPI     Objective:   Physical Exam  Constitutional: She is oriented to person, place, and time. She appears well-developed and well-nourished. No distress.  obese  HENT:  Head: Normocephalic and atraumatic.  Eyes: Pupils are equal, round, and reactive to light. Conjunctivae and EOM are normal.  Neck: Normal range of motion. Neck supple. No thyromegaly present.  Cardiovascular: Normal rate, regular rhythm, normal heart sounds and intact distal pulses.  No murmur heard. Pulmonary/Chest: Effort normal and breath sounds normal. No respiratory distress.  Abdominal: Soft. She exhibits no distension. There is no tenderness.  Musculoskeletal: She exhibits no edema.  Lymphadenopathy:    She has no cervical adenopathy.  Neurological: She is alert and oriented to person, place, and time.  Skin: Skin is warm and dry.  Psychiatric: She has a normal mood and affect. Her behavior is normal.  Vitals reviewed.         Assessment & Plan:

## 2018-04-30 NOTE — Assessment & Plan Note (Signed)
Referral to podiatry placed

## 2018-05-03 ENCOUNTER — Encounter: Payer: Self-pay | Admitting: General Practice

## 2018-05-07 ENCOUNTER — Ambulatory Visit: Payer: Medicare HMO | Admitting: *Deleted

## 2018-05-07 DIAGNOSIS — I4891 Unspecified atrial fibrillation: Secondary | ICD-10-CM

## 2018-05-07 DIAGNOSIS — Z5181 Encounter for therapeutic drug level monitoring: Secondary | ICD-10-CM

## 2018-05-07 LAB — POCT INR: INR: 2.3

## 2018-05-07 NOTE — Patient Instructions (Signed)
Description   Resume previous dose of coumadin  5mg  daily except 7.5mg  on Mondays Wednesdays and Fridays Recheck INR in 2 weeks  Call   Anticoagulation Clinic [phone] Main [phone] if have any change in medications or scheduled for any procedures

## 2018-05-18 ENCOUNTER — Inpatient Hospital Stay: Admission: RE | Admit: 2018-05-18 | Payer: Medicare HMO | Source: Ambulatory Visit

## 2018-05-19 ENCOUNTER — Other Ambulatory Visit: Payer: Self-pay

## 2018-05-19 ENCOUNTER — Encounter: Payer: Self-pay | Admitting: Physician Assistant

## 2018-05-19 ENCOUNTER — Ambulatory Visit: Payer: Medicare HMO | Admitting: *Deleted

## 2018-05-19 ENCOUNTER — Ambulatory Visit (INDEPENDENT_AMBULATORY_CARE_PROVIDER_SITE_OTHER): Payer: Medicare HMO | Admitting: Physician Assistant

## 2018-05-19 VITALS — BP 102/60 | HR 62 | Temp 98.1°F | Resp 14 | Ht 64.0 in | Wt 189.0 lb

## 2018-05-19 DIAGNOSIS — Z5181 Encounter for therapeutic drug level monitoring: Secondary | ICD-10-CM | POA: Diagnosis not present

## 2018-05-19 DIAGNOSIS — I4891 Unspecified atrial fibrillation: Secondary | ICD-10-CM

## 2018-05-19 DIAGNOSIS — W57XXXA Bitten or stung by nonvenomous insect and other nonvenomous arthropods, initial encounter: Secondary | ICD-10-CM | POA: Diagnosis not present

## 2018-05-19 DIAGNOSIS — S30860A Insect bite (nonvenomous) of lower back and pelvis, initial encounter: Secondary | ICD-10-CM

## 2018-05-19 LAB — POCT INR: INR: 1.6 — AB (ref 2.0–3.0)

## 2018-05-19 NOTE — Patient Instructions (Signed)
Description   Today May 29th take 2 tablets (10mg ) then continue same dose of  Coumadin   5mg  daily except 7.5mg  on Mondays Wednesdays and Fridays Recheck INR in 2 weeks  Call   Anticoagulation Clinic [phone] Main [phone] if have any change in medications or scheduled for any procedures

## 2018-05-19 NOTE — Progress Notes (Signed)
Patient presents to clinic today c/o tick bite to left mid back present for 2 days before removal by her son. States he was able to remove successfully. Denies fever, chills, malaise or fatigue. The area of bite has been very itchy. Denies any tenderness or hardness of the skin. Was told by coumadin nurse she should have looked at due to pruritus.   Past Medical History:  Diagnosis Date  . Allergic rhinitis   . Arthritis   . Diabetes mellitus   . GERD (gastroesophageal reflux disease)   . Hyperlipidemia   . Hypertension   . Pain in joint, ankle and foot 09/14/2013    Current Outpatient Medications on File Prior to Visit  Medication Sig Dispense Refill  . Ascorbic Acid (VITAMIN C) 1000 MG tablet Take 1,000 mg by mouth 2 (two) times daily.    . Calcium Carb-Cholecalciferol (CALTRATE 600+D3 SOFT PO) Take 1 tablet by mouth 2 (two) times daily.    [name] CRANBERRY PO Take 2 tablets by mouth daily.    . Cyanocobalamin (VITAMIN B-12) 1000 MCG SUBL Place 1 each under the tongue daily.    [name] diltiazem (CARDIZEM) 90 MG tablet Take 1 tablet (90 mg total) by mouth 2 (two) times daily. 180 tablet 1  . fish oil-omega-3 fatty acids 1000 MG capsule Take 2 g by mouth daily.    . fluticasone (FLONASE) 50 MCG/ACT nasal spray Place 2 sprays into both nostrils daily as needed for allergies or rhinitis.    . furosemide (LASIX) 20 MG tablet Take 1 tablet (20 mg total) by mouth daily. 90 tablet 1  . hydrocortisone 2.5 % cream APPLY TO THE FACE TWICE A DAY AS NEEDED 1 WEEK ON THEN 1 WEEK OFF  0  . lisinopril-hydrochlorothiazide (PRINZIDE,ZESTORETIC) 20-12.5 MG tablet Take 1 tablet by mouth daily. 90 tablet 1  . metFORMIN (GLUCOPHAGE) 500 MG tablet Take 1 tablet (500 mg total) by mouth 2 (two) times daily with a meal. 180 tablet 1  . metoprolol tartrate (LOPRESSOR) 25 MG tablet Take 1 tablet (25 mg total) by mouth 2 (two) times daily. 180 tablet 1  . Probiotic Product (PROBIOTIC PO) Take 1 capsule by mouth daily.       . ranitidine (ZANTAC) 300 MG tablet Take 1 tablet (300 mg total) by mouth at bedtime. 90 tablet 1  . vitamin E 100 UNIT capsule Take by mouth.    . warfarin (COUMADIN) 5 MG tablet TAKE AS DIRECTED BY COUMADIN CLINIC 40 tablet 2   No current facility-administered medications on file prior to visit.     Allergies  Allergen Reactions  . Niacin And Related Rash  . Keflex [Cephalexin] Other (See Comments)    Fatigue, sore throat, body aches  . Sausage [Pickled Meat] Other (See Comments)    Drowsiness and dizziness  . Tape Rash    Bandaids     Family History  Problem Relation Age of Onset  . Colon cancer Brother 68  . Colon cancer Maternal Aunt 90  . Breast cancer Paternal Grandmother 52  . Breast cancer Maternal Aunt 28  . Diabetes Maternal Aunt   . Heart disease Mother   . Stomach cancer Neg Hx     Social History   Socioeconomic History  . Marital status: Widowed    Spouse name: Not on file  . Number of children: 3  . Years of education: Not on file  . Highest education level: Not on file  Occupational History  . Not on file  Social Needs  . Financial resource strain: Not on file  . Food insecurity:    Worry: Not on file    Inability: Not on file  . Transportation needs:    Medical: Not on file    Non-medical: Not on file  Tobacco Use  . Smoking status: Never Smoker  . Smokeless tobacco: Never Used  Substance and Sexual Activity  . Alcohol use: No  . Drug use: No  . Sexual activity: Not on file  Lifestyle  . Physical activity:    Days per week: Not on file    Minutes per session: Not on file  . Stress: Not on file  Relationships  . Social connections:    Talks on phone: Not on file    Gets together: Not on file    Attends religious service: Not on file    Active member of club or organization: Not on file    Attends meetings of clubs or organizations: Not on file    Relationship status: Not on file  Other Topics Concern  . Not on file  Social History  Narrative  . Not on file   Review of Systems - See HPI.  All other ROS are negative.  BP 102/60   Pulse 62   Temp 98.1 F (36.7 C) (Oral)   Resp 14   Ht 5\' 4"  (1.626 m)   Wt 189 lb (85.7 kg)   SpO2 98%   BMI 32.44 kg/m   Physical Exam  Constitutional: She appears well-developed and well-nourished.  HENT:  Head: Normocephalic and atraumatic.  Cardiovascular: Normal rate and regular rhythm.  Pulmonary/Chest: Effort normal.  Skin:       Recent Results (from the past 2160 hour(s))  POCT INR     Status: None   Collection Time: 03/16/18 11:38 AM  Result Value Ref Range   INR 2.1   POCT INR     Status: None   Collection Time: 03/31/18 11:21 AM  Result Value Ref Range   INR 1.8   POCT INR     Status: None   Collection Time: 04/15/18  1:28 PM  Result Value Ref Range   INR 2.4   Ova and parasite examination     Status: Abnormal   Collection Time: 04/16/18 10:26 AM  Result Value Ref Range   MICRO NUMBER: [id number]    SPECIMEN QUALITY: ADEQUATE    Source NOT GIVEN    STATUS: FINAL    CONCENTRATE RESULT: Blastocystis species (A)     Comment: Few Blastocystis species   TRICHROME RESULT: Blastocystis species (A)     Comment: Few Blastocystis species   COMMENT:      It is controversial whether identification of Blastocystis in the stool requires treatment.  In a symptomatic patient, isolation of cysts in stool specimens should trigger a thorough evaluation for other causes of the patient's gastrointestinal tract  complaints, given the possibility for co-infection with other pathogens. Routine Ova and Parasite exam may not detect some parasites that occasionally cause diarrheal illness. Test code(s) 96283 (Cryptosporidium Ag., DFA) and/or 10018 (Cyclospora and  Isospora Exam) may be ordered to detect these parasites. One negative sample does not necessarily rule out the presence of a parasitic infection.  For additional information, please refer to  https://education.[facility].com/faq/FAQ203 (This link is  being provided for informational/ educational purposes only.)   Clostridium Difficile by PCR([facility])     Status: None   Collection Time: 04/16/18 10:26 AM  Result Value Ref Range  Toxigenic C. Difficile by PCR Negative Negative  Pancreatic elastase, fecal     Status: None   Collection Time: 04/16/18 10:26 AM  Result Value Ref Range   Pancreatic Elastase-1, Stool >500 mcg/g    Comment: . Adult and Pediatric Reference Ranges for   Pancreatic Elastase-1: .              Normal:      >200 mcg/g Moderate Pancreatic       Insufficiency:   100-200 mcg/g   Severe Pancreatic       Insufficiency:      <100 mcg/g . Elastase-1 (E-1) assay results are expressed in mcg/g, which represent mcg E1/g feces. . It is not necessary to interrupt enzyme substitution therapy.   Hemoglobin A1c     Status: Abnormal   Collection Time: 04/30/18  1:32 PM  Result Value Ref Range   Hgb A1c MFr Bld 6.8 (H) 4.6 - 6.5 %    Comment: Glycemic Control Guidelines for People with Diabetes:Non Diabetic:  <6%Goal of Therapy: <7%Additional Action Suggested:  >7%   Basic metabolic panel     Status: Abnormal   Collection Time: 04/30/18  1:32 PM  Result Value Ref Range   Sodium 139 135 - 145 mEq/L   Potassium 3.6 3.5 - 5.1 mEq/L   Chloride 102 96 - 112 mEq/L   CO2 28 19 - 32 mEq/L   Glucose, Bld 99 70 - 99 mg/dL   BUN 17 6 - 23 mg/dL   Creatinine, Ser 1.01 0.40 - 1.20 mg/dL   Calcium 9.1 8.4 - 10.5 mg/dL   GFR 56.52 (L) >60.00 mL/min  POCT INR     Status: None   Collection Time: 04/30/18  3:32 PM  Result Value Ref Range   INR 3.7   POCT INR     Status: None   Collection Time: 05/07/18 11:45 AM  Result Value Ref Range   INR 2.3   POCT INR     Status: Abnormal   Collection Time: 05/19/18  9:22 AM  Result Value Ref Range   INR 1.6 (A) 2.0 - 3.0   Assessment/Plan: 1. Tick bite of back, initial encounter With local reaction at site  of bite, allergic in nature. Very pruritic. No pain or induration. No evidence of Lyme rash. Will check antibodies today. Start Claritin for itch. Cold compresses to the area. Avoid heat. If any worsening symptoms or development of pain, will start Doxycycline for potential cellulitis.  - B. burgdorfi antibodies   Leeanne Rio, PA-C

## 2018-05-19 NOTE — Patient Instructions (Signed)
Please go to the lab today for blood work.  I will call you with your results. We will alter treatment regimen(s) if indicated by your results.   Please apply cold compresses to the area.  Cool showers will also help. Avoid the heat as much as possible.  A non-drowsy claritin can help with itch.   These seems most consistent with a localized allergic reaction to the bite. If not calming down in 24 hours or if anything worsens, please call me.  At present, we will hold off on antibiotic until results are in.    Tick Bite Information, Adult Ticks are insects that can bite. Most ticks live in shrubs and grassy areas. They climb onto people and animals that go by. Then they bite. Some ticks carry germs that can make you sick. How can I prevent tick bites?  Use an insect repellent that has 20% or higher of the ingredients DEET, picaridin, or IR3535. Put this insect repellent on: ? Bare skin. ? The tops of your boots. ? Your pant legs. ? The ends of your sleeves.  If you use an insect repellent that has the ingredient permethrin, make sure to follow the instructions on the bottle. Treat the following: ? Clothing. ? Supplies. ? Boots. ? Tents.  Wear long sleeves, long pants, and light colors.  Tuck your pant legs into your socks.  Stay in the middle of the trail.  Try not to walk through long grass.  Before going inside your house, check your clothes, hair, and skin for ticks. Make sure to check your head, neck, armpits, waist, groin, and joint areas.  Check for ticks every day.  When you come indoors: ? Wash your clothes right away. ? Shower right away. ? Dry your clothes in a dryer on high heat for 60 minutes or more. What is the right way to remove a tick? Remove a tick from your skin as soon as possible.  To remove a tick that is crawling on your skin: ? Go outdoors and brush the tick off. ? Use tape or a lint roller.  To remove a tick that is biting: ? Wash your  hands. ? If you have latex gloves, put them on. ? Use tweezers, curved forceps, or a tick-removal tool to grasp the tick. Grasp the tick as close to your skin and as close to the tick's head as possible. ? Gently pull up until the tick lets go.  Try to keep the tick's head attached to its body.  Do not twist or jerk the tick.  Do not squeeze or crush the tick.  Do not try to remove a tick with heat, alcohol, petroleum jelly, or fingernail polish. How should I get rid of a tick? Here are some ways to get rid of a tick that is alive:  Place the tick in rubbing alcohol.  Place the tick in a bag or container you can close tightly.  Wrap the tick tightly in tape.  Flush the tick down the toilet.  Contact a doctor if:  You have symptoms of a disease, such as: ? Pain in a muscle, joint, or bone. ? Trouble walking or moving your legs. ? Numbness in your legs. ? Inability to move (paralysis). ? A red rash that makes a circle (bull's-eye rash). ? Redness and swelling where the tick bit you. ? A fever. ? Throwing up (vomiting) over and over. ? Diarrhea. ? Weight loss. ? Tender and swollen lymph glands. ? Shortness  of breath. ? Cough. ? Belly pain (abdominal pain). ? Headache. ? Being more tired than normal. ? A change in how alert (conscious) you are. ? Confusion. Get help right away if:  You cannot remove a tick.  A part of a tick breaks off and gets stuck in your skin.  You are feeling worse. Summary  Ticks may carry germs that can make you sick.  To prevent tick bites, wear long sleeves, long pants, and light colors. Use insect repellent. Follow the instructions on the bottle.  If the tick is biting, do not try to remove it with heat, alcohol, petroleum jelly, or fingernail polish.  Use tweezers, curved forceps, or a tick-removal tool to grasp the tick. Gently pull up until the tick lets go. Do not twist or jerk the tick. Do not squeeze or crush the tick.  If you  have symptoms, contact a doctor. This information is not intended to replace advice given to you by your health care provider. Make sure you discuss any questions you have with your health care provider. Document Released: 03/04/2010 Document Revised: 03/20/2017 Document Reviewed: 03/20/2017 Elsevier Interactive Patient Education  2018 [employer].

## 2018-05-20 ENCOUNTER — Telehealth: Payer: Self-pay | Admitting: Family Medicine

## 2018-05-20 LAB — B. BURGDORFI ANTIBODIES

## 2018-05-20 NOTE — Telephone Encounter (Signed)
p pecCopied from [place] [id number]. Topic: Quick Communication - See Telephone Encounter >> May 20, 2018  2:41 PM Bea Graff, NT wrote: CRM for notification. See Telephone encounter for: 05/20/18. Pt calling back to get lab results.

## 2018-05-20 NOTE — Telephone Encounter (Signed)
Called patient.   Results given. Stated verbal understanding.  Documented under labs.

## 2018-05-27 DIAGNOSIS — D225 Melanocytic nevi of trunk: Secondary | ICD-10-CM | POA: Diagnosis not present

## 2018-05-27 DIAGNOSIS — L821 Other seborrheic keratosis: Secondary | ICD-10-CM | POA: Diagnosis not present

## 2018-05-27 DIAGNOSIS — L718 Other rosacea: Secondary | ICD-10-CM | POA: Diagnosis not present

## 2018-05-27 DIAGNOSIS — T07XXXA Unspecified multiple injuries, initial encounter: Secondary | ICD-10-CM | POA: Diagnosis not present

## 2018-05-27 DIAGNOSIS — L814 Other melanin hyperpigmentation: Secondary | ICD-10-CM | POA: Diagnosis not present

## 2018-05-27 DIAGNOSIS — D1801 Hemangioma of skin and subcutaneous tissue: Secondary | ICD-10-CM | POA: Diagnosis not present

## 2018-06-03 ENCOUNTER — Ambulatory Visit: Payer: Medicare HMO | Admitting: *Deleted

## 2018-06-03 DIAGNOSIS — Z5181 Encounter for therapeutic drug level monitoring: Secondary | ICD-10-CM | POA: Diagnosis not present

## 2018-06-03 DIAGNOSIS — I4891 Unspecified atrial fibrillation: Secondary | ICD-10-CM

## 2018-06-03 LAB — POCT INR: INR: 1.5 — AB (ref 2.0–3.0)

## 2018-06-03 NOTE — Patient Instructions (Addendum)
Description   Today take 7.5mg  (1.5 tablets) and tomorrow take 10mg  (2 tablets)  then change dose to Coumadin 7.5mg  daily 5mg  on Tuesday, Thursday, and Saturday.  Recheck INR in 2 weeks.  Call  Anticoagulation Clinic [phone] Main [phone] if have any change in medications or scheduled for any procedures

## 2018-06-04 ENCOUNTER — Encounter: Payer: Self-pay | Admitting: Podiatry

## 2018-06-04 ENCOUNTER — Ambulatory Visit: Payer: Self-pay | Admitting: Podiatry

## 2018-06-04 DIAGNOSIS — B351 Tinea unguium: Secondary | ICD-10-CM

## 2018-06-04 NOTE — Progress Notes (Signed)
Patient arrived late and left the office without being seen.   Gardiner Barefoot DPM

## 2018-06-17 ENCOUNTER — Ambulatory Visit (INDEPENDENT_AMBULATORY_CARE_PROVIDER_SITE_OTHER): Payer: Medicare HMO | Admitting: *Deleted

## 2018-06-17 DIAGNOSIS — I4891 Unspecified atrial fibrillation: Secondary | ICD-10-CM | POA: Diagnosis not present

## 2018-06-17 DIAGNOSIS — Z5181 Encounter for therapeutic drug level monitoring: Secondary | ICD-10-CM

## 2018-06-17 LAB — POCT INR: INR: 2.2 (ref 2.0–3.0)

## 2018-06-17 NOTE — Patient Instructions (Signed)
Description   Continue taking Coumadin 7.5mg  daily 5mg  on Tuesday, Thursday, and Saturday.  Recheck INR in 3 weeks.  Call  Anticoagulation Clinic [phone] Main [phone] if have any change in medications or scheduled for any procedures

## 2018-06-18 ENCOUNTER — Encounter: Payer: Self-pay | Admitting: General Practice

## 2018-06-18 ENCOUNTER — Ambulatory Visit
Admission: RE | Admit: 2018-06-18 | Discharge: 2018-06-18 | Disposition: A | Discharge status: Home / Self Care | Payer: Medicare HMO | Source: Ambulatory Visit | Attending: Family Medicine | Admitting: Family Medicine

## 2018-06-18 DIAGNOSIS — Z780 Asymptomatic menopausal state: Secondary | ICD-10-CM | POA: Diagnosis not present

## 2018-06-18 DIAGNOSIS — M85852 Other specified disorders of bone density and structure, left thigh: Secondary | ICD-10-CM | POA: Diagnosis not present

## 2018-06-27 ENCOUNTER — Other Ambulatory Visit: Payer: Self-pay | Admitting: Cardiovascular Disease

## 2018-06-28 ENCOUNTER — Other Ambulatory Visit: Payer: Self-pay | Admitting: Family Medicine

## 2018-07-02 ENCOUNTER — Telehealth: Payer: Self-pay | Admitting: Gastroenterology

## 2018-07-02 ENCOUNTER — Ambulatory Visit (INDEPENDENT_AMBULATORY_CARE_PROVIDER_SITE_OTHER): Payer: Medicare HMO | Admitting: Podiatry

## 2018-07-02 DIAGNOSIS — M79674 Pain in right toe(s): Secondary | ICD-10-CM

## 2018-07-02 DIAGNOSIS — L84 Corns and callosities: Secondary | ICD-10-CM

## 2018-07-02 DIAGNOSIS — E1142 Type 2 diabetes mellitus with diabetic polyneuropathy: Secondary | ICD-10-CM

## 2018-07-02 DIAGNOSIS — B351 Tinea unguium: Secondary | ICD-10-CM

## 2018-07-02 DIAGNOSIS — M79675 Pain in left toe(s): Secondary | ICD-10-CM | POA: Diagnosis not present

## 2018-07-02 DIAGNOSIS — M2011 Hallux valgus (acquired), right foot: Secondary | ICD-10-CM

## 2018-07-02 DIAGNOSIS — M2012 Hallux valgus (acquired), left foot: Secondary | ICD-10-CM

## 2018-07-02 NOTE — Telephone Encounter (Signed)
Patient states her diarrhea got a little better, but since having completed the antibiotic diarrhea has returned and worsened. Now having it daily. Denies any N/V or abdominal pain, no fever, no blood in her stool.

## 2018-07-02 NOTE — Telephone Encounter (Signed)
Patient scheduled to see APP on 7/19.

## 2018-07-02 NOTE — Telephone Encounter (Signed)
No answer, left message for patient to call back.

## 2018-07-02 NOTE — Telephone Encounter (Signed)
Please have her make an appointment with me on an APP

## 2018-07-05 ENCOUNTER — Other Ambulatory Visit: Payer: Self-pay | Admitting: Family Medicine

## 2018-07-05 ENCOUNTER — Encounter: Payer: Self-pay | Admitting: Podiatry

## 2018-07-05 NOTE — Progress Notes (Signed)
Subjective: Ms. Rickenbach if a 77 yo WF who presents to clinic today with diabetes, diabetic neuropathy and cc of painful, discolored, thick toenails which interfere with pain-free ambulation. Pain is aggravated when wearing enclosed shoe gear. Pain is getting progressively worse and relieved with periodic professional debridement.  She does have a history of trauma FBO to her left foot which was removed by Podiatry in 2014. She denies any other h/o wounds to feet.  Objective: There were no vitals filed for this visit. Vascular Examination: Capillary refill time <3 seconds x 10 digits Dorsalis pedis pulses present b/l Posterior tibial pulses nonpalpable b/l No digital hair x 10 digits Skin temperature gradient WNL  Dermatological Examination: Skin with normal turgor, texture and tone b/l Toenails 1-5 b/l discolored, thick, dystrophic with subungual debris and pain with palpation to nailbeds due to thickness of nails. Hyperkeratotic lesion submetatarsal head 2 left foot with subdermal hemorrhage. 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 Painful onychomycosis toenails 1-5 b/l 2. NIDDM with Diabetic neuropathy 3. Preulcerative callus submetatarsal head 2 left foot  Plan: 1. Toenails 1-5 b/l were debrided in length and girth without iatrogenic bleeding. 2. Preulcerative callus pared submetatarsal head 2 left foot 3. Diabetic shoe form completed today with the following qualifying diagnoses: NIDDM with neuropathy, HAV b/l, hammertoes 2-5 b/l, preulcerative callus submet 2 left foot 4. Patient to continue soft, supportive shoe gear 5. Patient to report any pedal injuries to medical professional  6. Follow up 3 months.  7. Patient/POA to call should there be a concern in the interim.

## 2018-07-09 ENCOUNTER — Encounter: Payer: Self-pay | Admitting: Physician Assistant

## 2018-07-09 ENCOUNTER — Ambulatory Visit: Payer: Medicare HMO | Admitting: *Deleted

## 2018-07-09 ENCOUNTER — Other Ambulatory Visit: Payer: Medicare HMO

## 2018-07-09 ENCOUNTER — Ambulatory Visit: Payer: Medicare HMO | Admitting: Physician Assistant

## 2018-07-09 ENCOUNTER — Encounter (INDEPENDENT_AMBULATORY_CARE_PROVIDER_SITE_OTHER): Payer: Self-pay

## 2018-07-09 VITALS — BP 110/60 | HR 74 | Ht 63.0 in | Wt 190.0 lb

## 2018-07-09 DIAGNOSIS — R197 Diarrhea, unspecified: Secondary | ICD-10-CM | POA: Diagnosis not present

## 2018-07-09 DIAGNOSIS — I4891 Unspecified atrial fibrillation: Secondary | ICD-10-CM

## 2018-07-09 DIAGNOSIS — Z5181 Encounter for therapeutic drug level monitoring: Secondary | ICD-10-CM

## 2018-07-09 LAB — POCT INR: INR: 2.5 (ref 2.0–3.0)

## 2018-07-09 MED ORDER — DICYCLOMINE HCL 20 MG PO TABS: 20.0000 mg | ORAL_TABLET | Freq: Every day | ORAL | 2 refills | Status: DC

## 2018-07-09 NOTE — Progress Notes (Signed)
Chief Complaint: Follow-up diarrhea  HPI:    Melanie [name] is a 77 year old Caucasian female with a past medical history as listed below, known to Dr. Loletha Carrow, who returns to clinic today for follow-up of her diarrhea.    02/23/2018 office visit Dr. Loletha Carrow described intermittent diarrhea without a clear trigger.  At that time arrange for colonoscopy.    03/24/2018 colonoscopy with decreased sphincter tone, diverticulosis in the left colon, redundant colon, four 4-6 mm polyps in the distal sigmoid colon, proximal descending colon and ascending colon, normal mucosa in the entire examined colon otherwise normal.  Biopsies returned normal with no sign of microscopic colitis.  Was recommended she did not need a follow-up routine colonoscopy at her age.  She was set up for C. difficile PCR, ova and parasites and fecal elastase.    04/16/2018 pancreatic elastase and C. difficile returned negative/normal.  Ova and parasites showed Blastocystis species.  Patient was treated with Metronidazole 1 tablet 3 times a day for 10 days.  Patient did have to have her INR checked afterwards as Flagyl can increase warfarin.    Today, patient tells me that she does not remember the Metronidazole helping at all.  Over the past 3 to 4 weeks she has had worsened symptoms.  Patient tells me that she used to have only occasional diarrhea and it has now turned into every day.  Patient describes one loose urgent bowel movement which is "pure water" that wakes her up in the middle of the night, every night.  Denies preceding abdominal cramps.  Typically after this 1 middle of the night bowel movement she has no further trouble throughout the day.  She does eat oatmeal in the morning which she feels like helps.  Also on a generic Align probiotic over the past 3 years.  Has tried Imodium in the past which typically would make her constipated for 2 days, tried this recently and did not help at all.    Denies fever, chills, blood in her stool,  weight loss, anorexia, nausea, vomiting, heartburn or reflux.  Past Medical History:  Diagnosis Date  . Allergic rhinitis   . Arthritis   . Diabetes mellitus   . GERD (gastroesophageal reflux disease)   . Hyperlipidemia   . Hypertension   . Pain in joint, ankle and foot 09/14/2013    Past Surgical History:  Procedure Laterality Date  . COLONOSCOPY     03/2018, removed polyps  . FRACTURE SURGERY Left     leg  . hiatal hernia surgery  2010  . ROTATOR CUFF REPAIR Right 2010  . TONSILLECTOMY      Current Outpatient Medications  Medication Sig Dispense Refill  . Ascorbic Acid (VITAMIN C) 1000 MG tablet Take 1,000 mg by mouth 2 (two) times daily.    . Calcium Carb-Cholecalciferol (CALTRATE 600+D3 SOFT PO) Take 1 tablet by mouth 2 (two) times daily.    [name] CRANBERRY PO Take 2 tablets by mouth daily.    . Cyanocobalamin (VITAMIN B-12) 1000 MCG SUBL Place 1 each under the tongue daily.    [name] diltiazem (CARDIZEM) 90 MG tablet TAKE 1 TABLET (90 MG TOTAL) BY MOUTH 2 (TWO) TIMES DAILY. 180 tablet 1  . fish oil-omega-3 fatty acids 1000 MG capsule Take 2 g by mouth daily.    . fluticasone (FLONASE) 50 MCG/ACT nasal spray Place 2 sprays into both nostrils daily as needed for allergies or rhinitis.    . furosemide (LASIX) 20 MG tablet Take 1 tablet (  20 mg total) by mouth daily. 90 tablet 1  . hydrocortisone 2.5 % cream APPLY TO THE FACE TWICE A DAY AS NEEDED 1 WEEK ON THEN 1 WEEK OFF  0  . lisinopril-hydrochlorothiazide (PRINZIDE,ZESTORETIC) 20-12.5 MG tablet Take 1 tablet by mouth daily. 90 tablet 1  . metFORMIN (GLUCOPHAGE) 500 MG tablet TAKE 1 TABLET (500 MG TOTAL) BY MOUTH 2 (TWO) TIMES DAILY WITH A MEAL. 180 tablet 1  . Probiotic Product (PROBIOTIC PO) Take 1 capsule by mouth daily.     . ranitidine (ZANTAC) 300 MG tablet TAKE 1 TABLET BY MOUTH EVERYDAY AT BEDTIME 90 tablet 1  . vitamin E 100 UNIT capsule Take by mouth.    . warfarin (COUMADIN) 5 MG tablet TAKE AS DIRECTED BY COUMADIN CLINIC  40 tablet 2  . dicyclomine (BENTYL) 20 MG tablet Take 1 tablet (20 mg total) by mouth at bedtime. 30 tablet 2  . metoprolol tartrate (LOPRESSOR) 25 MG tablet Take 1 tablet (25 mg total) by mouth 2 (two) times daily. 180 tablet 1   No current facility-administered medications for this visit.     Allergies as of 07/09/2018 - Review Complete 07/09/2018  Allergen Reaction Noted  . Niacin and related Rash 01/14/2012  . Keflex [cephalexin] Other (See Comments) 05/26/2014  . Sausage [pickled meat] Other (See Comments) 12/29/2016  . Tape Rash 12/29/2016    Family History  Problem Relation Age of Onset  . Colon cancer Brother 5  . Colon cancer Maternal Aunt 9  . Breast cancer Paternal Grandmother 9  . Breast cancer Maternal Aunt 28  . Diabetes Maternal Aunt   . Heart disease Mother   . Stomach cancer Neg Hx     Social History   Socioeconomic History  . Marital status: Widowed    Spouse name: Not on file  . Number of children: 3  . Years of education: Not on file  . Highest education level: Not on file  Occupational History  . Not on file  Social Needs  . Financial resource strain: Not on file  . Food insecurity:    Worry: Not on file    Inability: Not on file  . Transportation needs:    Medical: Not on file    Non-medical: Not on file  Tobacco Use  . Smoking status: Never Smoker  . Smokeless tobacco: Never Used  Substance and Sexual Activity  . Alcohol use: No  . Drug use: No  . Sexual activity: Not on file  Lifestyle  . Physical activity:    Days per week: Not on file    Minutes per session: Not on file  . Stress: Not on file  Relationships  . Social connections:    Talks on phone: Not on file    Gets together: Not on file    Attends religious service: Not on file    Active member of club or organization: Not on file    Attends meetings of clubs or organizations: Not on file    Relationship status: Not on file  . Intimate partner violence:    Fear of  current or ex partner: Not on file    Emotionally abused: Not on file    Physically abused: Not on file    Forced sexual activity: Not on file  Other Topics Concern  . Not on file  Social History Narrative  . Not on file    Review of Systems:    Constitutional: No weight loss, fever or chills Cardiovascular: No chest  pain Respiratory: No SOB  Gastrointestinal: See HPI and otherwise negative   Physical Exam:  Vital signs: BP 110/60   Pulse 74   Ht 5\' 3"  (1.6 m)   Wt 190 lb (86.2 kg)   BMI 33.66 kg/m   Constitutional:   Pleasant Elderly Caucasian female appears to be in NAD, Well developed, Well nourished, alert and cooperative Respiratory: Respirations even and unlabored. Lungs clear to auscultation bilaterally.   No wheezes, crackles, or rhonchi.  Cardiovascular: Normal S1, S2. No MRG. Regular rate and rhythm. No peripheral edema, cyanosis or pallor.  Gastrointestinal:  Soft, nondistended, nontender. No rebound or guarding. Normal bowel sounds. No appreciable masses or hepatomegaly. Psychiatric: Demonstrates good judgement and reason without abnormal affect or behaviors.  See HPI for recent labs.  Assessment: 1.  Chronic diarrhea: Was intermittent over the past few years, now once a night watery urgent stool wakes her from her sleep, recent colonoscopy negative, pathology negative for microscopic colitis, O&P showing Blastocystis species, treated with Metronidazole, patient felt no improvement; consider IBS versus other  Plan: 1.  Ordered repeat GI pathogen panel and O&P today. 2.  Prescribed dicyclomine 20 mg nightly.  #30 with 2 refills 3.  If above does not help, could consider TCA or SNRI. 4. Patient to follow with me in 4-6 weeks or sooner if necessary.  Ellouise Newer, PA-C [facility] 07/09/2018, 3:32 PM  Cc: [name], MD

## 2018-07-09 NOTE — Patient Instructions (Signed)
Description   Continue taking Coumadin 7.5mg  daily 5mg  on Tuesday, Thursday, and Saturday.  Recheck INR in 4 weeks.  Call  Anticoagulation Clinic [phone] Main [phone] if have any change in medications or scheduled for any procedures

## 2018-07-09 NOTE — Patient Instructions (Addendum)
We have sent the following medications to your pharmacy for you to pick up at your convenience:  Dicyclomine 20 mg at bedtime  Your provider has requested that you go to the basement level for lab work before leaving today. Press "B" on the elevator. The lab is located at the first door on the left as you exit the elevator.

## 2018-07-15 ENCOUNTER — Other Ambulatory Visit: Payer: Medicare HMO

## 2018-07-16 ENCOUNTER — Other Ambulatory Visit: Payer: Medicare HMO

## 2018-07-16 DIAGNOSIS — R197 Diarrhea, unspecified: Secondary | ICD-10-CM

## 2018-07-18 NOTE — Progress Notes (Signed)
Thank you for sending this case to me. I have reviewed the entire note, and the outlined plan seems appropriate.  It was unclear if the pathogen on stool culture was really causing diarrhea.   Treating it has not helped, so I suspect it was not the cause, as is usually the case with blastocystis hominis.   Melanie [name] does not seem to think the metformin is causing this, and she may be right.  However, I think that possibility must be considered at this point, and I recommend she stop it for a week to see if anything improves.   There has been no weight loss since at least last Nov during PCP encounter for diarrhea, so I doubt there is a  malabsorptive condition.  I agree with repeat stool studies, which it does not appear she has yet done.  And a trial of low dose anti-spasmodic is reasonable.  She previously reported tendency to constipation with more potent anti-diarrheal meds.  Melanie Lund, MD

## 2018-07-19 ENCOUNTER — Telehealth: Payer: Self-pay

## 2018-07-19 NOTE — Telephone Encounter (Signed)
The patient has been notified of this information and all questions answered. She will call back  In 1 week to update on condition after stopping metformin.

## 2018-07-19 NOTE — Telephone Encounter (Signed)
Left message on machine to call back  

## 2018-07-19 NOTE — Telephone Encounter (Signed)
-----   Message from [name], [place] sent at 07/19/2018  8:50 AM EDT ----- Regarding: Please call See Dr. Corena Pilgrim recommendations on my last note. Can you call and discuss with her. Thanks-JLL  ----- Message ----- From: [name], MD Sent: 07/18/2018   1:41 PM To: [name], PA    ----- Message ----- From: [name], [place] Sent: 07/09/2018   3:43 PM To: [name], MD

## 2018-07-19 NOTE — Telephone Encounter (Signed)
Thank you for sending this case to me. I have reviewed the entire note, and the outlined plan seems appropriate.  It was unclear if the pathogen on stool culture was really causing diarrhea.   Treating it has not helped, so I suspect it was not the cause, as is usually the case with blastocystis hominis.   Melanie [name] does not seem to think the metformin is causing this, and she may be right.  However, I think that possibility must be considered at this point, and I recommend she stop it for a week to see if anything improves.   There has been no weight loss since at least last Nov during PCP encounter for diarrhea, so I doubt there is a  malabsorptive condition.  I agree with repeat stool studies, which it does not appear she has yet done.  And a trial of low dose anti-spasmodic is reasonable.  She previously reported tendency to constipation with more potent anti-diarrheal meds.  Wilfrid Lund, MD

## 2018-07-20 LAB — GASTROINTESTINAL PATHOGEN PANEL PCR
C. difficile Tox A/B, PCR: NOT DETECTED
CAMPYLOBACTER, PCR: NOT DETECTED
Cryptosporidium, PCR: NOT DETECTED
E COLI (ETEC) LT/ST, PCR: NOT DETECTED
E coli (STEC) stx1/stx2, PCR: NOT DETECTED
E coli 0157, PCR: NOT DETECTED
GIARDIA LAMBLIA, PCR: NOT DETECTED
NOROVIRUS, PCR: NOT DETECTED
ROTAVIRUS, PCR: NOT DETECTED
SHIGELLA, PCR: NOT DETECTED
Salmonella, PCR: NOT DETECTED

## 2018-07-20 LAB — OVA AND PARASITE EXAMINATION
SPECIMEN QUALITY: ADEQUATE
VKL: 90886903

## 2018-07-22 ENCOUNTER — Other Ambulatory Visit: Payer: Self-pay | Admitting: Family Medicine

## 2018-08-02 ENCOUNTER — Other Ambulatory Visit: Payer: Self-pay | Admitting: Family Medicine

## 2018-08-06 ENCOUNTER — Ambulatory Visit (HOSPITAL_COMMUNITY)
Admission: RE | Admit: 2018-08-06 | Discharge: 2018-08-06 | Disposition: A | Discharge status: Home / Self Care | Payer: Medicare HMO | Source: Ambulatory Visit | Attending: Nurse Practitioner | Admitting: Nurse Practitioner

## 2018-08-06 ENCOUNTER — Ambulatory Visit: Payer: Medicare HMO | Admitting: *Deleted

## 2018-08-06 ENCOUNTER — Encounter (HOSPITAL_COMMUNITY): Payer: Self-pay | Admitting: Nurse Practitioner

## 2018-08-06 VITALS — BP 134/76 | HR 64 | Ht 63.0 in | Wt 190.8 lb

## 2018-08-06 DIAGNOSIS — I4891 Unspecified atrial fibrillation: Secondary | ICD-10-CM

## 2018-08-06 DIAGNOSIS — Z7984 Long term (current) use of oral hypoglycemic drugs: Secondary | ICD-10-CM | POA: Diagnosis not present

## 2018-08-06 DIAGNOSIS — I10 Essential (primary) hypertension: Secondary | ICD-10-CM | POA: Diagnosis not present

## 2018-08-06 DIAGNOSIS — Z5181 Encounter for therapeutic drug level monitoring: Secondary | ICD-10-CM

## 2018-08-06 DIAGNOSIS — K219 Gastro-esophageal reflux disease without esophagitis: Secondary | ICD-10-CM | POA: Diagnosis not present

## 2018-08-06 DIAGNOSIS — Z7951 Long term (current) use of inhaled steroids: Secondary | ICD-10-CM | POA: Diagnosis not present

## 2018-08-06 DIAGNOSIS — E785 Hyperlipidemia, unspecified: Secondary | ICD-10-CM | POA: Diagnosis not present

## 2018-08-06 DIAGNOSIS — E119 Type 2 diabetes mellitus without complications: Secondary | ICD-10-CM | POA: Diagnosis not present

## 2018-08-06 DIAGNOSIS — I480 Paroxysmal atrial fibrillation: Secondary | ICD-10-CM | POA: Insufficient documentation

## 2018-08-06 DIAGNOSIS — Z79899 Other long term (current) drug therapy: Secondary | ICD-10-CM | POA: Insufficient documentation

## 2018-08-06 DIAGNOSIS — Z7901 Long term (current) use of anticoagulants: Secondary | ICD-10-CM | POA: Insufficient documentation

## 2018-08-06 LAB — POCT INR: INR: 2.1 (ref 2.0–3.0)

## 2018-08-06 NOTE — Patient Instructions (Addendum)
Continue taking Coumadin 7.5mg  daily 5mg  on Tuesday, Thursday, and Saturday.  Recheck INR in 4 weeks.  Call  Anticoagulation Clinic [phone] Main [phone] if have any change in medications or scheduled for any procedures

## 2018-08-06 NOTE — Progress Notes (Signed)
Primary Care Physician: [name], MD Referring Physician:  [name] is a 77 y.o. female with h/o DM, HTN, paroxysmal afib for f/u in afib clinic. She reports that she has done well since being seen last and has not noted any afib. She could not afford DOAC and is on now on warfarin followed by PCP. No bleeding issues.  Today, she denies symptoms of palpitations, chest pain, shortness of breath, orthopnea, PND, lower extremity edema, dizziness, presyncope, syncope, or neurologic sequela. The patient is tolerating medications without difficulties and is otherwise without complaint today.   Past Medical History:  Diagnosis Date  . Allergic rhinitis   . Arthritis   . Diabetes mellitus   . GERD (gastroesophageal reflux disease)   . Hyperlipidemia   . Hypertension   . Pain in joint, ankle and foot 09/14/2013   Past Surgical History:  Procedure Laterality Date  . COLONOSCOPY     03/2018, removed polyps  . FRACTURE SURGERY Left     leg  . hiatal hernia surgery  2010  . ROTATOR CUFF REPAIR Right 2010  . TONSILLECTOMY      Current Outpatient Medications  Medication Sig Dispense Refill  . Ascorbic Acid (VITAMIN C) 1000 MG tablet Take 1,000 mg by mouth 2 (two) times daily.    . Calcium Carb-Cholecalciferol (CALTRATE 600+D3 SOFT PO) Take 1 tablet by mouth 2 (two) times daily.    [name] CRANBERRY PO Take 2 tablets by mouth daily.    . Cyanocobalamin (VITAMIN B-12) 1000 MCG SUBL Place 1 each under the tongue daily.    [name] diltiazem (CARDIZEM) 90 MG tablet TAKE 1 TABLET (90 MG TOTAL) BY MOUTH 2 (TWO) TIMES DAILY. 180 tablet 1  . fish oil-omega-3 fatty acids 1000 MG capsule Take 2 g by mouth daily.    . fluticasone (FLONASE) 50 MCG/ACT nasal spray Place 2 sprays into both nostrils daily as needed for allergies or rhinitis.    . furosemide (LASIX) 20 MG tablet TAKE 1 TABLET BY MOUTH EVERY DAY 90 tablet 1  . hydrocortisone 2.5 % cream APPLY TO THE FACE TWICE A DAY AS  NEEDED 1 WEEK ON THEN 1 WEEK OFF  0  . lisinopril-hydrochlorothiazide (PRINZIDE,ZESTORETIC) 20-12.5 MG tablet TAKE 1 TABLET BY MOUTH EVERY DAY 90 tablet 1  . metFORMIN (GLUCOPHAGE) 500 MG tablet TAKE 1 TABLET (500 MG TOTAL) BY MOUTH 2 (TWO) TIMES DAILY WITH A MEAL. 180 tablet 1  . metoprolol tartrate (LOPRESSOR) 25 MG tablet TAKE 1 TABLET BY MOUTH TWICE A DAY 180 tablet 1  . Probiotic Product (PROBIOTIC PO) Take 1 capsule by mouth daily.     . ranitidine (ZANTAC) 300 MG tablet TAKE 1 TABLET BY MOUTH EVERYDAY AT BEDTIME 90 tablet 1  . vitamin E 100 UNIT capsule Take by mouth.    . warfarin (COUMADIN) 5 MG tablet TAKE AS DIRECTED BY COUMADIN CLINIC 40 tablet 2  . dicyclomine (BENTYL) 20 MG tablet Take 1 tablet (20 mg total) by mouth at bedtime. (Patient not taking: Reported on 08/06/2018) 30 tablet 2   No current facility-administered medications for this encounter.     Allergies  Allergen Reactions  . Niacin And Related Rash  . Keflex [Cephalexin] Other (See Comments)    Fatigue, sore throat, body aches  . Sausage [Pickled Meat] Other (See Comments)    Drowsiness and dizziness  . Tape Rash    Bandaids     Social History   Socioeconomic History  .  Marital status: Widowed    Spouse name: Not on file  . Number of children: 3  . Years of education: Not on file  . Highest education level: Not on file  Occupational History  . Not on file  Social Needs  . Financial resource strain: Not on file  . Food insecurity:    Worry: Not on file    Inability: Not on file  . Transportation needs:    Medical: Not on file    Non-medical: Not on file  Tobacco Use  . Smoking status: Never Smoker  . Smokeless tobacco: Never Used  Substance and Sexual Activity  . Alcohol use: No  . Drug use: No  . Sexual activity: Not on file  Lifestyle  . Physical activity:    Days per week: Not on file    Minutes per session: Not on file  . Stress: Not on file  Relationships  . Social connections:     Talks on phone: Not on file    Gets together: Not on file    Attends religious service: Not on file    Active member of club or organization: Not on file    Attends meetings of clubs or organizations: Not on file    Relationship status: Not on file  . Intimate partner violence:    Fear of current or ex partner: Not on file    Emotionally abused: Not on file    Physically abused: Not on file    Forced sexual activity: Not on file  Other Topics Concern  . Not on file  Social History Narrative  . Not on file    Family History  Problem Relation Age of Onset  . Colon cancer Brother 77  . Colon cancer Maternal Aunt 54  . Breast cancer Paternal Grandmother 64  . Breast cancer Maternal Aunt 28  . Diabetes Maternal Aunt   . Heart disease Mother   . Stomach cancer Neg Hx     ROS- All systems are reviewed and negative except as per the HPI above  Physical Exam: Vitals:   08/06/18 1128  BP: 134/76  Pulse: 64  Weight: 86.5 kg  Height: 5\' 3"  (1.6 m)   Wt Readings from Last 3 Encounters:  08/06/18 86.5 kg  07/09/18 86.2 kg  05/19/18 85.7 kg    Labs: Lab Results  Component Value Date   NA 139 04/30/2018   K 3.6 04/30/2018   CL 102 04/30/2018   CO2 28 04/30/2018   GLUCOSE 99 04/30/2018   BUN 17 04/30/2018   CREATININE 1.01 04/30/2018   CALCIUM 9.1 04/30/2018   Lab Results  Component Value Date   INR 2.1 08/06/2018   Lab Results  Component Value Date   CHOL 221 (H) 01/14/2018   HDL 37.50 (L) 01/14/2018   LDLCALC 144 (H) 09/16/2017   TRIG 244.0 (H) 01/14/2018     GEN- The patient is well appearing, alert and oriented x 3 today.   Head- normocephalic, atraumatic Eyes-  Sclera clear, conjunctiva pink Ears- hearing intact Oropharynx- clear Neck- supple, no JVP Lymph- no cervical lymphadenopathy Lungs- Clear to ausculation bilaterally, normal work of breathing Heart- Regular rate and rhythm, no murmurs, rubs or gallops, PMI not laterally displaced GI- soft, NT,  ND, + BS Extremities- no clubbing, cyanosis, or edema MS- no significant deformity or atrophy Skin- no rash or lesion Psych- euthymic mood, full affect Neuro- strength and sensation are intact  EKG- NSR at 64 bpm, PR itn 162 ms,  qrs int 84 ms, qtc 412 ms Epic records reviewed Echo- 2/18-Study Conclusions  - Left ventricle: The cavity size was normal. Wall thickness was   normal. Systolic function was normal. The estimated ejection   fraction was in the range of 55% to 60%. Wall motion was normal;   there were no regional wall motion abnormalities. Doppler   parameters are consistent with abnormal left ventricular   relaxation (grade 1 diastolic dysfunction). - Mitral valve: Calcified annulus. Mildly thickened leaflets . - Left atrium: The atrium was moderately dilated.  Impressions:  - Normal LV systolic function; grade 1 diastolic dysfunction;   moderate LAE; mild TR.   Assessment and Plan: 1.Paraxysmal afib No afib appreciated Currently maintaining SR on metoprolol 25 mg bid Continue Cardizem 90 mg bid( pt has been on this for years) Now on warfarin for chadsvasc score of at least 5 Bleeding precautions reviewed  2. HTN Stable  F/u one year  Geroge Baseman. Metzli Pollick, [facility] [address] [place], [place] [address] [phone]

## 2018-08-13 ENCOUNTER — Telehealth: Payer: Self-pay | Admitting: Gastroenterology

## 2018-08-13 NOTE — Telephone Encounter (Signed)
Left message on machine to call back   I spoke with the pt regarding test results and Dr Loletha Carrow recommendations to stop metformin and call back in 1 month to update on her condition.

## 2018-08-13 NOTE — Telephone Encounter (Signed)
Dr Loletha Carrow the pt stopped Metformin for 1 1/2 weeks and has not had any improvement in diarrhea.  Please advise

## 2018-08-13 NOTE — Telephone Encounter (Signed)
Tests ordered by Anderson Malta L., routed to [facility]. Thanks.

## 2018-08-16 NOTE — Telephone Encounter (Signed)
Pt states she has not heard anything back regarding this best call back #[phone].

## 2018-08-16 NOTE — Telephone Encounter (Signed)
Increase the dicyclomine to 20 mg twice daily and arrange a follow up with me.

## 2018-08-17 NOTE — Telephone Encounter (Signed)
It's up to her, but I would like her to consider trying it. I have no other medicine suggestions at this time.

## 2018-08-17 NOTE — Telephone Encounter (Signed)
The patient has been notified of this information and all questions answered. She states she will try the dicyclomine and keep upcoming appt with Dr Loletha Carrow

## 2018-08-17 NOTE — Telephone Encounter (Signed)
The pt states she has not started dicyclomine d/t "irregular heart"  She states she is scared to take it.  She saw cardiologist and advised her she was prescribed it but was not told she couldn't.  Please advise.

## 2018-08-17 NOTE — Telephone Encounter (Signed)
Left message on machine to call back  

## 2018-08-19 ENCOUNTER — Ambulatory Visit (INDEPENDENT_AMBULATORY_CARE_PROVIDER_SITE_OTHER): Payer: Medicare HMO | Admitting: Family Medicine

## 2018-08-19 ENCOUNTER — Encounter: Payer: Self-pay | Admitting: Family Medicine

## 2018-08-19 ENCOUNTER — Other Ambulatory Visit: Payer: Self-pay

## 2018-08-19 VITALS — BP 112/74 | HR 60 | Temp 97.9°F | Resp 16 | Ht 63.0 in | Wt 189.4 lb

## 2018-08-19 DIAGNOSIS — Z23 Encounter for immunization: Secondary | ICD-10-CM | POA: Diagnosis not present

## 2018-08-19 DIAGNOSIS — E1142 Type 2 diabetes mellitus with diabetic polyneuropathy: Secondary | ICD-10-CM

## 2018-08-19 DIAGNOSIS — E785 Hyperlipidemia, unspecified: Secondary | ICD-10-CM | POA: Diagnosis not present

## 2018-08-19 DIAGNOSIS — I10 Essential (primary) hypertension: Secondary | ICD-10-CM

## 2018-08-19 LAB — BASIC METABOLIC PANEL
BUN: 15 mg/dL (ref 6–23)
CALCIUM: 9.2 mg/dL (ref 8.4–10.5)
CO2: 29 mEq/L (ref 19–32)
Chloride: 102 mEq/L (ref 96–112)
Creatinine, Ser: 1.16 mg/dL (ref 0.40–1.20)
GFR: 48.14 mL/min — ABNORMAL LOW (ref >60.00)
GLUCOSE: 123 mg/dL — AB (ref 70–99)
POTASSIUM: 3.7 meq/L (ref 3.5–5.1)
SODIUM: 139 meq/L (ref 135–145)

## 2018-08-19 LAB — HEPATIC FUNCTION PANEL
ALT: 9 U/L (ref 0–35)
AST: 12 U/L (ref 0–37)
Albumin: 4.2 g/dL (ref 3.5–5.2)
Alkaline Phosphatase: 46 U/L (ref 39–117)
BILIRUBIN TOTAL: 0.7 mg/dL (ref 0.2–1.2)
Bilirubin, Direct: 0.1 mg/dL (ref 0.0–0.3)
Total Protein: 6.6 g/dL (ref 6.0–8.3)

## 2018-08-19 LAB — CBC WITH DIFFERENTIAL/PLATELET
Basophils Absolute: 0.1 10*3/uL (ref 0.0–0.1)
Basophils Relative: 1.4 % (ref 0.0–3.0)
EOS ABS: 0.2 10*3/uL (ref 0.0–0.7)
Eosinophils Relative: 2.9 % (ref 0.0–5.0)
HCT: 40.1 % (ref 36.0–46.0)
HEMOGLOBIN: 13.5 g/dL (ref 12.0–15.0)
LYMPHS ABS: 1.5 10*3/uL (ref 0.7–4.0)
Lymphocytes Relative: 24.4 % (ref 12.0–46.0)
MCHC: 33.7 g/dL (ref 30.0–36.0)
MCV: 89.1 fl (ref 78.0–100.0)
MONO ABS: 0.5 10*3/uL (ref 0.1–1.0)
Monocytes Relative: 8.2 % (ref 3.0–12.0)
NEUTROS PCT: 63.1 % (ref 43.0–77.0)
Neutro Abs: 4 10*3/uL (ref 1.4–7.7)
Platelets: 217 10*3/uL (ref 150.0–400.0)
RBC: 4.5 Mil/uL (ref 3.87–5.11)
RDW: 13.1 % (ref 11.5–15.5)
WBC: 6.3 10*3/uL (ref 4.0–10.5)

## 2018-08-19 LAB — HEMOGLOBIN A1C: Hgb A1c MFr Bld: 7.1 % — ABNORMAL HIGH (ref 4.6–6.5)

## 2018-08-19 LAB — LIPID PANEL
CHOLESTEROL: 234 mg/dL — AB (ref 0–200)
HDL: 31.8 mg/dL — AB (ref >39.00)
NonHDL: 202.07
Total CHOL/HDL Ratio: 7
Triglycerides: 268 mg/dL — ABNORMAL HIGH (ref 0.0–149.0)
VLDL: 53.6 mg/dL — ABNORMAL HIGH (ref 0.0–40.0)

## 2018-08-19 LAB — LDL CHOLESTEROL, DIRECT: Direct LDL: 155 mg/dL

## 2018-08-19 LAB — TSH: TSH: 1.7 u[IU]/mL (ref 0.35–4.50)

## 2018-08-19 NOTE — Assessment & Plan Note (Signed)
Chronic problem.  Well controlled today.  Asymptomatic.  Check labs.  No anticipated med changes. 

## 2018-08-19 NOTE — Assessment & Plan Note (Signed)
Chronic problem.  Has refused statins in the past due to a previous issue w/ Lipitor- 'I didn't feel good'.  Discussed that it is in her best interest to start a statin and there are other options.  Check labs and determine appropriate dose of statin

## 2018-08-19 NOTE — Progress Notes (Signed)
   Subjective:    Patient ID: Melanie [name], female    DOB: 1941-07-26, 77 y.o.   MRN: [id number]  HPI HTN- chronic problem, on Diltiazem 90mg , Lasix 20mg , Lisinopril HCTZ 20/12.5mg  daily, Metoprolol 25mg  BID.  Denies CP, SOB, HAs, visual changes, edema.  Hyperlipidemia- chronic problem, pt has been told to start statin but is resistant to this.  + continuing diarrhea- following w/ GI.  Denies abd pain, N/V.  DM- chronic problem, on Metformin 500mg  BID.  On ACE for renal protection.  UTD on eye exam and foot exam.  Denies symptomatic lows.  + numbness of feet but this is unchanged.  Pt was instructed to hold Metformin for 1-2 weeks by GI but this did not improve her diarrhea.   Review of Systems For ROS see HPI     Objective:   Physical Exam  Constitutional: She is oriented to person, place, and time. She appears well-developed and well-nourished. No distress.  HENT:  Head: Normocephalic and atraumatic.  Eyes: Pupils are equal, round, and reactive to light. Conjunctivae and EOM are normal.  Neck: Normal range of motion. Neck supple. No thyromegaly present.  Cardiovascular: Normal rate, regular rhythm, normal heart sounds and intact distal pulses.  No murmur heard. Pulmonary/Chest: Effort normal and breath sounds normal. No respiratory distress.  Abdominal: Soft. She exhibits no distension. There is no tenderness.  Musculoskeletal: She exhibits no edema.  Lymphadenopathy:    She has no cervical adenopathy.  Neurological: She is alert and oriented to person, place, and time.  Skin: Skin is warm and dry.  Psychiatric: She has a normal mood and affect. Her behavior is normal.  Vitals reviewed.         Assessment & Plan:

## 2018-08-19 NOTE — Assessment & Plan Note (Signed)
Chronic problem.  Pt is worried that A1C may be higher than last visit b/c she has been holding her metformin periodically due to ongoing diarrhea.  UTD on foot exam, eye exam, on ACE for renal protection.  Check labs.  Adjust meds prn

## 2018-08-19 NOTE — Patient Instructions (Signed)
Follow up in 3-4 months to recheck diabetes We'll notify you of your lab results and make any changes if needed Continue to work on healthy diet and regular exercise- you can do it! Call with any questions or concerns Happy Labor Day!!!

## 2018-08-20 ENCOUNTER — Other Ambulatory Visit: Payer: Self-pay | Admitting: General Practice

## 2018-08-20 DIAGNOSIS — E785 Hyperlipidemia, unspecified: Secondary | ICD-10-CM

## 2018-08-20 MED ORDER — ROSUVASTATIN CALCIUM 10 MG PO TABS: 10.0000 mg | ORAL_TABLET | Freq: Every day | ORAL | 6 refills | Status: DC

## 2018-08-24 ENCOUNTER — Other Ambulatory Visit: Payer: Self-pay | Admitting: General Practice

## 2018-08-24 ENCOUNTER — Encounter: Payer: Self-pay | Admitting: General Practice

## 2018-09-02 ENCOUNTER — Other Ambulatory Visit: Payer: Medicare HMO | Admitting: Orthotics

## 2018-09-03 ENCOUNTER — Ambulatory Visit: Payer: Medicare HMO | Admitting: Pharmacist

## 2018-09-03 DIAGNOSIS — Z5181 Encounter for therapeutic drug level monitoring: Secondary | ICD-10-CM

## 2018-09-03 DIAGNOSIS — I4891 Unspecified atrial fibrillation: Secondary | ICD-10-CM

## 2018-09-03 LAB — POCT INR: INR: 2.5 (ref 2.0–3.0)

## 2018-09-03 NOTE — Patient Instructions (Signed)
Description   Continue taking Coumadin 7.5mg  daily except 5mg  on Tuesday, Thursday, and Saturday.  Recheck INR in 6 weeks.  Call  Anticoagulation Clinic [phone] Main [phone] if have any change in medications or scheduled for any procedures

## 2018-09-23 ENCOUNTER — Ambulatory Visit: Payer: Medicare HMO | Admitting: Orthotics

## 2018-09-23 DIAGNOSIS — M2011 Hallux valgus (acquired), right foot: Secondary | ICD-10-CM

## 2018-09-23 DIAGNOSIS — L84 Corns and callosities: Secondary | ICD-10-CM

## 2018-09-23 DIAGNOSIS — M2012 Hallux valgus (acquired), left foot: Secondary | ICD-10-CM

## 2018-09-23 DIAGNOSIS — E1142 Type 2 diabetes mellitus with diabetic polyneuropathy: Secondary | ICD-10-CM

## 2018-09-23 NOTE — Progress Notes (Signed)

## 2018-09-25 ENCOUNTER — Other Ambulatory Visit: Payer: Self-pay | Admitting: Cardiovascular Disease

## 2018-10-01 ENCOUNTER — Other Ambulatory Visit (INDEPENDENT_AMBULATORY_CARE_PROVIDER_SITE_OTHER): Payer: Medicare HMO

## 2018-10-01 ENCOUNTER — Encounter: Payer: Self-pay | Admitting: Gastroenterology

## 2018-10-01 ENCOUNTER — Ambulatory Visit: Payer: Medicare HMO | Admitting: Gastroenterology

## 2018-10-01 VITALS — BP 114/60 | HR 64 | Ht 63.5 in | Wt 190.0 lb

## 2018-10-01 DIAGNOSIS — K529 Noninfective gastroenteritis and colitis, unspecified: Secondary | ICD-10-CM

## 2018-10-01 LAB — IGA: IGA: 228 mg/dL (ref 68–378)

## 2018-10-01 NOTE — Progress Notes (Signed)
[facility] Progress Note  Chief Complaint: Chronic diarrhea  Subjective  History:  Melanie [name] follows up for her chronic diarrhea, which has behaved much the same as before.  She still typically has 1 watery stool per day, usually soon after awakening or within the first couple hours.  She does not have abdominal pain, her appetite is been good and her weight stable.  She was only able to take 2 doses of dicyclomine prescribed after the July visit, because both times it gave her rotations.  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  She recalls no improvement in the diarrhea afterwards, and her stool is still positive for Blastocystis when rechecked in July.  Fecal elastase had been negative before. Melanie [name] recalls spending about a week off metformin without any change in symptoms.  ROS: Cardiovascular:  no chest pain Respiratory: no dyspnea She has neuropathy symptoms in her legs and feet  The patient's Past Medical, Family and Social History were reviewed and are on file in the EMR.  Objective:  Med list reviewed  Current Outpatient Medications:  .  Ascorbic Acid (VITAMIN C) 1000 MG tablet, Take 1,000 mg by mouth 2 (two) times daily., Disp: , Rfl:  .  Calcium Carb-Cholecalciferol (CALTRATE 600+D3 SOFT PO), Take 1 tablet by mouth 2 (two) times daily., Disp: , Rfl:  .  CRANBERRY PO, Take 2 tablets by mouth daily., Disp: , Rfl:  .  Cyanocobalamin (VITAMIN B-12) 1000 MCG SUBL, Place 1 each under the tongue daily., Disp: , Rfl:  .  diltiazem (CARDIZEM) 90 MG tablet, TAKE 1 TABLET (90 MG TOTAL) BY MOUTH 2 (TWO) TIMES DAILY., Disp: 180 tablet, Rfl: 1 .  fish oil-omega-3 fatty acids 1000 MG capsule, Take 2 g by mouth daily., Disp: , Rfl:  .  fluticasone (FLONASE) 50 MCG/ACT nasal spray, Place 2 sprays into both nostrils daily as needed for allergies or  rhinitis., Disp: , Rfl:  .  furosemide (LASIX) 20 MG tablet, TAKE 1 TABLET BY MOUTH EVERY DAY, Disp: 90 tablet, Rfl: 1 .  hydrocortisone 2.5 % cream, APPLY TO THE FACE TWICE A DAY AS NEEDED 1 WEEK ON THEN 1 WEEK OFF, Disp: , Rfl: 0 .  lisinopril-hydrochlorothiazide (PRINZIDE,ZESTORETIC) 20-12.5 MG tablet, TAKE 1 TABLET BY MOUTH EVERY DAY, Disp: 90 tablet, Rfl: 1 .  metFORMIN (GLUCOPHAGE) 500 MG tablet, TAKE 1 TABLET (500 MG TOTAL) BY MOUTH 2 (TWO) TIMES DAILY WITH A MEAL., Disp: 180 tablet, Rfl: 1 .  metoprolol tartrate (LOPRESSOR) 25 MG tablet, TAKE 1 TABLET BY MOUTH TWICE A DAY, Disp: 180 tablet, Rfl: 1 .  Probiotic Product (PROBIOTIC PO), Take 1 capsule by mouth daily. , Disp: , Rfl:  .  ranitidine (ZANTAC) 300 MG tablet, TAKE 1 TABLET BY MOUTH EVERYDAY AT BEDTIME, Disp: 90 tablet, Rfl: 1 .  rosuvastatin (CRESTOR) 10 MG tablet, Take 1 tablet (10 mg total) by mouth daily., Disp: 30 tablet, Rfl: 6 .  vitamin E 100 UNIT capsule, Take by mouth., Disp: , Rfl:  .  warfarin (COUMADIN) 5 MG tablet, TAKE AS DIRECTED BY COUMADIN CLINIC, Disp: 120 tablet, Rfl: 1  No longer on zantac  Vital signs in last 24 hrs: Vitals:   10/01/18 1041  BP: 114/60  Pulse: 64    Physical Exam  She is well-appearing, modestly overweight, no muscle wasting  HEENT: sclera anicteric, oral mucosa moist without lesions  Neck: supple, no thyromegaly, JVD or lymphadenopathy  Cardiac: RRR without murmurs, S1S2 heard, no peripheral edema  Pulm: clear to auscultation bilaterally, normal RR and effort noted  Abdomen: soft, no tenderness, with active bowel sounds. No guarding or palpable hepatosplenomegaly.  Skin; warm and dry, no jaundice or rash  Recent Labs:  Stool studies as noted above    @ASSESSMENTPLANBEGIN @ Assessment: Encounter Diagnosis  Name Primary?  . Chronic diarrhea Yes   Thus far her work-up has been unrevealing.  She reports that when she takes an Imodium tablet it always controls the  diarrhea, and she may not have a bowel movement the whole rest of the day.  Typically, even if she does have a loose stool in the morning, it is the only one for the remainder of the day.  Despite that, this does not sound like a malabsorptive/small bowel condition since her weight has been stable.  I wonder if she may have diabetic diarrhea/dysmotility.  Plan: Consult with primary care about a longer trial off metformin such as at least 2 weeks, though she would probably need alternate oral hypoglycemic for treatment in the meantime.  We will copy them my note today for that purpose.  TTG/IgA rule out celiac sprue, that would be unusual to come on at this age.  She still has her gallbladder, so trial of cholestyramine would not likely be helpful.  It would also be difficult to manage with her other medicines.  Bacterial overgrowth would most likely have been covered with metronidazole.  Continue as needed use of Imodium since it works well.  She was cautioned to avoid constipation with that. Total time 30 minutes, over half spent face-to-face with patient in counseling and coordination of care.   Nelida Meuse III

## 2018-10-01 NOTE — Patient Instructions (Signed)
If you are age 77 or older, your body mass index should be between 23-30. Your Body mass index is 33.13 kg/m. If this is out of the aforementioned range listed, please consider follow up with your Primary Care Provider.  If you are age 67 or younger, your body mass index should be between 19-25. Your Body mass index is 33.13 kg/m. If this is out of the aformentioned range listed, please consider follow up with your Primary Care Provider.   Your provider has requested that you go to the basement level for lab work before leaving today. Press "B" on the elevator. The lab is located at the first door on the left as you exit the elevator.  It was a pleasure to see you today!  Dr. Loletha Carrow

## 2018-10-04 LAB — TISSUE TRANSGLUTAMINASE, IGA: (TTG) AB, IGA: 1 U/mL

## 2018-10-06 ENCOUNTER — Other Ambulatory Visit: Payer: Self-pay | Admitting: *Deleted

## 2018-10-06 DIAGNOSIS — E785 Hyperlipidemia, unspecified: Secondary | ICD-10-CM

## 2018-10-07 ENCOUNTER — Other Ambulatory Visit: Payer: Medicare HMO

## 2018-10-13 ENCOUNTER — Ambulatory Visit: Payer: Medicare HMO | Admitting: *Deleted

## 2018-10-13 DIAGNOSIS — I4891 Unspecified atrial fibrillation: Secondary | ICD-10-CM

## 2018-10-13 DIAGNOSIS — Z5181 Encounter for therapeutic drug level monitoring: Secondary | ICD-10-CM | POA: Diagnosis not present

## 2018-10-13 LAB — POCT INR: INR: 2.4 (ref 2.0–3.0)

## 2018-10-13 NOTE — Patient Instructions (Signed)
Description   Continue taking Coumadin 7.5mg  daily except 5mg  on Tuesday, Thursday, and Saturday.  Recheck INR in 6 weeks.  Call  Anticoagulation Clinic [phone] Main [phone] if have any change in medications or scheduled for any procedures

## 2018-10-15 ENCOUNTER — Ambulatory Visit: Payer: Medicare HMO | Admitting: Podiatry

## 2018-10-29 ENCOUNTER — Ambulatory Visit: Payer: Medicare HMO | Admitting: Podiatry

## 2018-10-29 DIAGNOSIS — L84 Corns and callosities: Secondary | ICD-10-CM

## 2018-10-29 DIAGNOSIS — M79674 Pain in right toe(s): Secondary | ICD-10-CM | POA: Diagnosis not present

## 2018-10-29 DIAGNOSIS — E1142 Type 2 diabetes mellitus with diabetic polyneuropathy: Secondary | ICD-10-CM | POA: Diagnosis not present

## 2018-10-29 DIAGNOSIS — M79675 Pain in left toe(s): Secondary | ICD-10-CM | POA: Diagnosis not present

## 2018-10-29 DIAGNOSIS — M2012 Hallux valgus (acquired), left foot: Secondary | ICD-10-CM | POA: Diagnosis not present

## 2018-10-29 DIAGNOSIS — B351 Tinea unguium: Secondary | ICD-10-CM | POA: Diagnosis not present

## 2018-10-29 DIAGNOSIS — M2011 Hallux valgus (acquired), right foot: Secondary | ICD-10-CM

## 2018-10-29 NOTE — Patient Instructions (Signed)

## 2018-11-04 ENCOUNTER — Ambulatory Visit: Payer: Medicare HMO | Admitting: Orthotics

## 2018-11-04 DIAGNOSIS — L84 Corns and callosities: Secondary | ICD-10-CM

## 2018-11-04 DIAGNOSIS — M2011 Hallux valgus (acquired), right foot: Secondary | ICD-10-CM

## 2018-11-04 DIAGNOSIS — E1142 Type 2 diabetes mellitus with diabetic polyneuropathy: Secondary | ICD-10-CM

## 2018-11-04 DIAGNOSIS — M2012 Hallux valgus (acquired), left foot: Secondary | ICD-10-CM

## 2018-11-04 NOTE — Progress Notes (Signed)
Returned shoes to get larger size 9.5 m

## 2018-11-15 ENCOUNTER — Encounter: Payer: Self-pay | Admitting: Podiatry

## 2018-11-15 NOTE — Progress Notes (Signed)
Subjective: Melanie [name] presents with diabetes, diabetic neuropathy and cc of painful, discolored, thick toenails and painful callus which interfere with activities of daily living. Pain is aggravated when wearing enclosed shoe gear. Pain is relieved with periodic professional debridement.  She is also here to pick up her diabetic shoes.   Objective: Vascular Examination: Capillary refill time <3 seconds x 10 digits Dorsalis pedis present b/l Posterior tibial pulses absent b/l No digital hair x 10 digits Skin temperature WNL b/l  Dermatological Examination: Skin with normal turgor, texture and tone b/l Toenails 1-5 b/l discolored, thick, dystrophic with subungual debris and pain with palpation to nailbeds due to thickness of nails. Hyperkeratotic lesion submet head 2nd left foot with subdermal hemorrhage. No warmth, no edema, no flocculence, no erythema.  Musculoskeletal: Muscle strength 5/5 to all LE muscle groups HAV with bunion b/l Hammertoe 2-5 b/l  Neurological: Sensation diminished with 10 gram monofilament.   Assessment: 1. Painful onychomycosis toenails 1-5 b/l 2. Preulcerative callus submet head 2nd left foot 3. NIDDM with Diabetic neuropathy  Plan: 1. Continue diabetic foot care principles.  2. Toenails 1-5 b/l were debrided in length and girth without iatrogenic bleeding. 3. Hyperkeratotic lesion pared with sterile chisel blade submet head 2nd left foot 4. Dispensed 1 pair extra depth shoes with 3 pair total contact insoles. Discussed break in period, return policy.  5. Patient to continue soft, supportive shoe gear 6. Patient to report any pedal injuries to medical professional  7. Follow up 3 months. Patient/POA to call should there be a concern in the interim.

## 2018-11-25 ENCOUNTER — Ambulatory Visit: Payer: Medicare HMO

## 2018-11-25 ENCOUNTER — Other Ambulatory Visit: Payer: Self-pay

## 2018-11-25 ENCOUNTER — Ambulatory Visit: Payer: Medicare HMO | Admitting: Orthotics

## 2018-11-25 ENCOUNTER — Ambulatory Visit (INDEPENDENT_AMBULATORY_CARE_PROVIDER_SITE_OTHER): Payer: Medicare HMO | Admitting: Family Medicine

## 2018-11-25 ENCOUNTER — Encounter: Payer: Self-pay | Admitting: Family Medicine

## 2018-11-25 VITALS — BP 121/81 | HR 56 | Temp 98.1°F | Resp 16 | Ht 64.0 in | Wt 192.0 lb

## 2018-11-25 DIAGNOSIS — M79674 Pain in right toe(s): Secondary | ICD-10-CM

## 2018-11-25 DIAGNOSIS — E1142 Type 2 diabetes mellitus with diabetic polyneuropathy: Secondary | ICD-10-CM | POA: Diagnosis not present

## 2018-11-25 DIAGNOSIS — M2012 Hallux valgus (acquired), left foot: Secondary | ICD-10-CM

## 2018-11-25 DIAGNOSIS — M79675 Pain in left toe(s): Secondary | ICD-10-CM

## 2018-11-25 DIAGNOSIS — I4891 Unspecified atrial fibrillation: Secondary | ICD-10-CM

## 2018-11-25 DIAGNOSIS — Z5181 Encounter for therapeutic drug level monitoring: Secondary | ICD-10-CM

## 2018-11-25 DIAGNOSIS — L84 Corns and callosities: Secondary | ICD-10-CM

## 2018-11-25 DIAGNOSIS — E785 Hyperlipidemia, unspecified: Secondary | ICD-10-CM | POA: Diagnosis not present

## 2018-11-25 DIAGNOSIS — K219 Gastro-esophageal reflux disease without esophagitis: Secondary | ICD-10-CM | POA: Diagnosis not present

## 2018-11-25 DIAGNOSIS — M2011 Hallux valgus (acquired), right foot: Secondary | ICD-10-CM

## 2018-11-25 DIAGNOSIS — B351 Tinea unguium: Secondary | ICD-10-CM

## 2018-11-25 LAB — BASIC METABOLIC PANEL
BUN: 17 mg/dL (ref 6–23)
CO2: 30 mEq/L (ref 19–32)
Calcium: 9.6 mg/dL (ref 8.4–10.5)
Chloride: 99 mEq/L (ref 96–112)
Creatinine, Ser: 1.23 mg/dL — ABNORMAL HIGH (ref 0.40–1.20)
GFR: 44.96 mL/min — ABNORMAL LOW (ref >60.00)
Glucose, Bld: 159 mg/dL — ABNORMAL HIGH (ref 70–99)
Potassium: 4.3 mEq/L (ref 3.5–5.1)
Sodium: 136 mEq/L (ref 135–145)

## 2018-11-25 LAB — LIPID PANEL
CHOLESTEROL: 245 mg/dL — AB (ref 0–200)
HDL: 36.3 mg/dL — ABNORMAL LOW (ref >39.00)
NonHDL: 208.31
Total CHOL/HDL Ratio: 7
Triglycerides: 230 mg/dL — ABNORMAL HIGH (ref 0.0–149.0)
VLDL: 46 mg/dL — ABNORMAL HIGH (ref 0.0–40.0)

## 2018-11-25 LAB — LDL CHOLESTEROL, DIRECT: Direct LDL: 175 mg/dL

## 2018-11-25 LAB — HEPATIC FUNCTION PANEL
ALK PHOS: 48 U/L (ref 39–117)
ALT: 13 U/L (ref 0–35)
AST: 15 U/L (ref 0–37)
Albumin: 4.3 g/dL (ref 3.5–5.2)
Bilirubin, Direct: 0.1 mg/dL (ref 0.0–0.3)
Total Bilirubin: 0.6 mg/dL (ref 0.2–1.2)
Total Protein: 7 g/dL (ref 6.0–8.3)

## 2018-11-25 LAB — POCT INR: INR: 2.1 (ref 2.0–3.0)

## 2018-11-25 LAB — HEMOGLOBIN A1C: Hgb A1c MFr Bld: 7.2 % — ABNORMAL HIGH (ref 4.6–6.5)

## 2018-11-25 MED ORDER — OMEPRAZOLE 20 MG PO CPDR: 20.0000 mg | DELAYED_RELEASE_CAPSULE | Freq: Every day | ORAL | 3 refills | Status: DC

## 2018-11-25 NOTE — Progress Notes (Signed)
   Subjective:    Patient ID: Melanie [name], female    DOB: March 28, 1941, 77 y.o.   MRN: [id number]  HPI DM- chronic problem, on Metformin 500mg  BID.  On ACE for renal protection.  UTD on foot exam, due for eye exam later this month.  Never started her Crestor despite LDL of 155 and goal of <70.  No CP, SOB, HAs, visual changes.  + neuropathy- sees podiatry.  Denies symptomatic lows.  Hyperlipidemia- chronic problem, is supposed to be on Crestor 10mg  daily but did not pick up or start medication.  She did not start meds b/c she is fearful of her liver.  No abd pain, N/V.  GERD- pt is asking to switch from Ranitidine due to recent cancer concerns   Review of Systems For ROS see HPI     Objective:   Physical Exam  Constitutional: She is oriented to person, place, and time. She appears well-developed and well-nourished. No distress.  obese  HENT:  Head: Normocephalic and atraumatic.  Eyes: Pupils are equal, round, and reactive to light. Conjunctivae and EOM are normal.  Neck: Normal range of motion. Neck supple. No thyromegaly present.  Cardiovascular: Normal rate, regular rhythm, normal heart sounds and intact distal pulses.  No murmur heard. Pulmonary/Chest: Effort normal and breath sounds normal. No respiratory distress.  Abdominal: Soft. She exhibits no distension. There is no tenderness.  Musculoskeletal: She exhibits no edema.  Lymphadenopathy:    She has no cervical adenopathy.  Neurological: She is alert and oriented to person, place, and time.  Skin: Skin is warm and dry.  Psychiatric: She has a normal mood and affect. Her behavior is normal.  Vitals reviewed.         Assessment & Plan:

## 2018-11-25 NOTE — Assessment & Plan Note (Signed)
Chronic problem.  Never started Crestor.  Reviewed at length today that given her diabetes she needs much better LDL control.  She is fearful of a statin due to liver issues.  Reviewed that this is rare and that we monitor for this.  Repeat labs today to see how much of a reduction is needed.  Will determine next steps based on lab results

## 2018-11-25 NOTE — Assessment & Plan Note (Signed)
Chronic problem.  Tolerating Metformin w/o difficulty.  UTD on foot exam, on ACE for renal protection.  Needs to schedule eye exam.  Check labs.  Adjust meds prn

## 2018-11-25 NOTE — Progress Notes (Signed)
Picked up her shoes reordered; she seems fine with them.

## 2018-11-25 NOTE — Patient Instructions (Signed)
Schedule your complete physical in 3-4 months We'll notify you of your lab results and make any changes if needed SCHEDULE YOUR EYE EXAM Continue to work on healthy diet and regular exercise- you can do it! STOP the Ranitidine START the Omeprazole Call with any questions or concerns Happy Holidays!

## 2018-11-25 NOTE — Assessment & Plan Note (Signed)
Will switch to Omeprazole given the cancer concerns associated w/ Ranitidine.  Pt expressed understanding and is in agreement w/ plan.

## 2018-11-25 NOTE — Patient Instructions (Signed)
Description   Continue taking Coumadin 7.5mg  daily except 5mg  on Tuesdays, Thursdays, and Saturdays.  Recheck INR in 6 weeks.  Call  Anticoagulation Clinic [phone] Main [phone] if have any change in medications or scheduled for any procedures

## 2018-11-26 ENCOUNTER — Other Ambulatory Visit: Payer: Self-pay | Admitting: General Practice

## 2018-11-26 MED ORDER — ROSUVASTATIN CALCIUM 10 MG PO TABS: 10.0000 mg | ORAL_TABLET | Freq: Every day | ORAL | 1 refills | Status: DC

## 2018-12-08 ENCOUNTER — Other Ambulatory Visit: Payer: Self-pay | Admitting: Family Medicine

## 2018-12-26 ENCOUNTER — Other Ambulatory Visit: Payer: Self-pay | Admitting: Family Medicine

## 2018-12-30 ENCOUNTER — Encounter (INDEPENDENT_AMBULATORY_CARE_PROVIDER_SITE_OTHER): Payer: Self-pay

## 2018-12-30 ENCOUNTER — Ambulatory Visit: Payer: Medicare HMO

## 2018-12-30 DIAGNOSIS — Z5181 Encounter for therapeutic drug level monitoring: Secondary | ICD-10-CM | POA: Diagnosis not present

## 2018-12-30 DIAGNOSIS — I4891 Unspecified atrial fibrillation: Secondary | ICD-10-CM

## 2018-12-30 LAB — POCT INR: INR: 2.5 (ref 2.0–3.0)

## 2018-12-30 NOTE — Patient Instructions (Signed)
Description   Continue on same dosage 7.5mg  daily except 5mg  on Tuesdays, Thursdays, and Saturdays.  Recheck INR in 6 weeks.  Call  Anticoagulation Clinic [phone] Main [phone] if have any change in medications or scheduled for any procedures

## 2019-01-28 ENCOUNTER — Ambulatory Visit: Payer: Medicare HMO | Admitting: Podiatry

## 2019-02-01 ENCOUNTER — Other Ambulatory Visit: Payer: Self-pay | Admitting: Family Medicine

## 2019-02-03 ENCOUNTER — Ambulatory Visit: Payer: Medicare HMO

## 2019-02-03 DIAGNOSIS — Z5181 Encounter for therapeutic drug level monitoring: Secondary | ICD-10-CM

## 2019-02-03 DIAGNOSIS — I4891 Unspecified atrial fibrillation: Secondary | ICD-10-CM

## 2019-02-03 LAB — POCT INR: INR: 2.4 (ref 2.0–3.0)

## 2019-02-03 NOTE — Patient Instructions (Signed)
Description   Continue on same dosage 7.5mg  daily except 5mg  on Tuesdays, Thursdays, and Saturdays.  Recheck INR in 6 weeks.  Call  Anticoagulation Clinic [phone] Main [phone] if have any change in medications or scheduled for any procedures

## 2019-02-22 ENCOUNTER — Ambulatory Visit (HOSPITAL_COMMUNITY)
Admission: RE | Admit: 2019-02-22 | Discharge: 2019-02-22 | Disposition: A | Discharge status: Home / Self Care | Payer: Medicare HMO | Source: Ambulatory Visit | Attending: Nurse Practitioner | Admitting: Nurse Practitioner

## 2019-02-22 ENCOUNTER — Encounter (HOSPITAL_COMMUNITY): Payer: Self-pay | Admitting: Nurse Practitioner

## 2019-02-22 VITALS — BP 120/74 | HR 82 | Ht 64.0 in | Wt 195.0 lb

## 2019-02-22 DIAGNOSIS — I10 Essential (primary) hypertension: Secondary | ICD-10-CM | POA: Insufficient documentation

## 2019-02-22 DIAGNOSIS — M1990 Unspecified osteoarthritis, unspecified site: Secondary | ICD-10-CM | POA: Diagnosis not present

## 2019-02-22 DIAGNOSIS — I4892 Unspecified atrial flutter: Secondary | ICD-10-CM | POA: Diagnosis not present

## 2019-02-22 DIAGNOSIS — E785 Hyperlipidemia, unspecified: Secondary | ICD-10-CM | POA: Insufficient documentation

## 2019-02-22 DIAGNOSIS — Z888 Allergy status to other drugs, medicaments and biological substances status: Secondary | ICD-10-CM | POA: Insufficient documentation

## 2019-02-22 DIAGNOSIS — I4891 Unspecified atrial fibrillation: Secondary | ICD-10-CM

## 2019-02-22 DIAGNOSIS — E119 Type 2 diabetes mellitus without complications: Secondary | ICD-10-CM | POA: Insufficient documentation

## 2019-02-22 DIAGNOSIS — Z7901 Long term (current) use of anticoagulants: Secondary | ICD-10-CM | POA: Insufficient documentation

## 2019-02-22 DIAGNOSIS — Z79899 Other long term (current) drug therapy: Secondary | ICD-10-CM | POA: Diagnosis not present

## 2019-02-22 DIAGNOSIS — I484 Atypical atrial flutter: Secondary | ICD-10-CM | POA: Diagnosis not present

## 2019-02-22 DIAGNOSIS — Z7984 Long term (current) use of oral hypoglycemic drugs: Secondary | ICD-10-CM | POA: Insufficient documentation

## 2019-02-22 DIAGNOSIS — I480 Paroxysmal atrial fibrillation: Secondary | ICD-10-CM | POA: Insufficient documentation

## 2019-02-22 DIAGNOSIS — K219 Gastro-esophageal reflux disease without esophagitis: Secondary | ICD-10-CM | POA: Insufficient documentation

## 2019-02-22 DIAGNOSIS — Z881 Allergy status to other antibiotic agents status: Secondary | ICD-10-CM | POA: Diagnosis not present

## 2019-02-22 DIAGNOSIS — Z8249 Family history of ischemic heart disease and other diseases of the circulatory system: Secondary | ICD-10-CM | POA: Insufficient documentation

## 2019-02-22 LAB — PROTIME-INR
INR: 2.8 — ABNORMAL HIGH (ref 0.8–1.2)
Prothrombin Time: 29.4 seconds — ABNORMAL HIGH (ref 11.4–15.2)

## 2019-02-22 MED ORDER — METOPROLOL TARTRATE 25 MG PO TABS: 37.5000 mg | ORAL_TABLET | Freq: Two times a day (BID) | ORAL | 1 refills | Status: AC

## 2019-02-22 NOTE — Progress Notes (Addendum)
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 She is in atrial flutter today but is not sure if she is persistence or paroxysmal. She is rate controlled.She has not had any change in her usual health.   She could not afford DOAC and is on now on warfarin followed by PCP  She is planning on a cruise on 3/20 that she is nervous about with the breakout of the corona virus.  Today, she denies symptoms of palpitations, chest pain, shortness of breath, orthopnea, PND, lower extremity edema, dizziness, presyncope, syncope, or neurologic sequela. The patient is tolerating medications without difficulties and is otherwise without complaint today.   Past Medical History:  Diagnosis Date  . Allergic rhinitis   . Arthritis   . Diabetes mellitus   . GERD (gastroesophageal reflux disease)   . Hyperlipidemia   . Hypertension   . Pain in joint, ankle and foot 09/14/2013   Past Surgical History:  Procedure Laterality Date  . COLONOSCOPY     03/2018, removed polyps  . FRACTURE SURGERY Left     leg  . hiatal hernia surgery  2010  . ROTATOR CUFF REPAIR Right 2010  . TONSILLECTOMY      Current Outpatient Medications  Medication Sig Dispense Refill  . Ascorbic Acid (VITAMIN C) 1000 MG tablet Take 1,000 mg by mouth 2 (two) times daily.    . Calcium Carb-Cholecalciferol (CALTRATE 600+D3 SOFT PO) Take 1 tablet by mouth 2 (two) times daily.    [name] CRANBERRY PO Take 2 tablets by mouth daily.    . Cyanocobalamin (VITAMIN B-12) 1000 MCG SUBL Place 1 each under the tongue daily.    [name] diltiazem (CARDIZEM) 90 MG tablet TAKE 1 TABLET (90 MG TOTAL) BY MOUTH 2 (TWO) TIMES DAILY. 180 tablet 1  . fish oil-omega-3 fatty acids 1000 MG capsule Take 2 g by mouth daily.    . fluticasone (FLONASE) 50 MCG/ACT nasal spray Place 2  sprays into both nostrils daily as needed for allergies or rhinitis.    . furosemide (LASIX) 20 MG tablet TAKE 1 TABLET BY MOUTH EVERY DAY 90 tablet 1  . hydrocortisone 2.5 % cream APPLY TO THE FACE TWICE A DAY AS NEEDED 1 WEEK ON THEN 1 WEEK OFF  0  . lisinopril-hydrochlorothiazide (PRINZIDE,ZESTORETIC) 20-12.5 MG tablet TAKE 1 TABLET BY MOUTH EVERY DAY 90 tablet 1  . loperamide (IMODIUM A-D) 2 MG capsule Take by mouth as needed for diarrhea or loose stools.    . metFORMIN (GLUCOPHAGE) 500 MG tablet TAKE 1 TABLET (500 MG TOTAL) BY MOUTH 2 (TWO) TIMES DAILY WITH A MEAL. 180 tablet 1  . metoprolol tartrate (LOPRESSOR) 25 MG tablet Take 1.5 tablets (37.5 mg total) by mouth 2 (two) times daily. 180 tablet 1  . omeprazole (PRILOSEC) 20 MG capsule Take 1 capsule (20 mg total) by mouth daily. 30 capsule 3  . rosuvastatin (CRESTOR) 10 MG tablet Take 1 tablet (10 mg total) by mouth daily. 90 tablet 1  . vitamin E 100 UNIT capsule Take by mouth.    . warfarin (COUMADIN) 5 MG tablet TAKE AS DIRECTED BY COUMADIN CLINIC 120 tablet 1  . Probiotic Product (PROBIOTIC PO) Take 1 capsule by mouth daily.      No current facility-administered medications for  this encounter.     Allergies  Allergen Reactions  . Niacin And Related Rash  . Keflex [Cephalexin] Other (See Comments)    Fatigue, sore throat, body aches  . Sausage [Pickled Meat] Other (See Comments)    Drowsiness and dizziness  . Tape Rash    Bandaids     Social History   Socioeconomic History  . Marital status: Widowed    Spouse name: Not on file  . Number of children: 3  . Years of education: Not on file  . Highest education level: Not on file  Occupational History  . Not on file  Social Needs  . Financial resource strain: Not on file  . Food insecurity:    Worry: Not on file    Inability: Not on file  . Transportation needs:    Medical: Not on file    Non-medical: Not on file  Tobacco Use  . Smoking status: Never Smoker  .  Smokeless tobacco: Never Used  Substance and Sexual Activity  . Alcohol use: No  . Drug use: No  . Sexual activity: Not on file  Lifestyle  . Physical activity:    Days per week: Not on file    Minutes per session: Not on file  . Stress: Not on file  Relationships  . Social connections:    Talks on phone: Not on file    Gets together: Not on file    Attends religious service: Not on file    Active member of club or organization: Not on file    Attends meetings of clubs or organizations: Not on file    Relationship status: Not on file  . Intimate partner violence:    Fear of current or ex partner: Not on file    Emotionally abused: Not on file    Physically abused: Not on file    Forced sexual activity: Not on file  Other Topics Concern  . Not on file  Social History Narrative  . Not on file    Family History  Problem Relation Age of Onset  . Colon cancer Brother 47  . Colon cancer Maternal Aunt 63  . Breast cancer Paternal Grandmother 46  . Breast cancer Maternal Aunt 28  . Diabetes Maternal Aunt   . Heart disease Mother   . Stomach cancer Neg Hx     ROS- All systems are reviewed and negative except as per the HPI above  Physical Exam: Vitals:   02/22/19 1434  BP: 120/74  Pulse: 82  Weight: 88.5 kg  Height: 5\' 4"  (1.626 m)   Wt Readings from Last 3 Encounters:  02/22/19 88.5 kg  11/25/18 87.1 kg  10/01/18 86.2 kg    Labs: Lab Results  Component Value Date   NA 136 11/25/2018   K 4.3 11/25/2018   CL 99 11/25/2018   CO2 30 11/25/2018   GLUCOSE 159 (H) 11/25/2018   BUN 17 11/25/2018   CREATININE 1.23 (H) 11/25/2018   CALCIUM 9.6 11/25/2018   Lab Results  Component Value Date   INR 2.4 02/03/2019   Lab Results  Component Value Date   CHOL 245 (H) 11/25/2018   HDL 36.30 (L) 11/25/2018   LDLCALC 144 (H) 09/16/2017   TRIG 230.0 (H) 11/25/2018     GEN- The patient is well appearing, alert and oriented x 3 today.   Head- normocephalic,  atraumatic Eyes-  Sclera clear, conjunctiva pink Ears- hearing intact Oropharynx- clear Neck- supple, no JVP Lymph- no cervical lymphadenopathy  Lungs- Clear to ausculation bilaterally, normal work of breathing Heart- irregular rate and rhythm, no murmurs, rubs or gallops, PMI not laterally displaced GI- soft, NT, ND, + BS Extremities- no clubbing, cyanosis, or edema MS- no significant deformity or atrophy Skin- no rash or lesion Psych- euthymic mood, full affect Neuro- strength and sensation are intact  EKG- atrial flutter at 82 bpm Epic records reviewed Echo- 2/18-Study Conclusions  - Left ventricle: The cavity size was normal. Wall thickness was   normal. Systolic function was normal. The estimated ejection   fraction was in the range of 55% to 60%. Wall motion was normal;   there were no regional wall motion abnormalities. Doppler   parameters are consistent with abnormal left ventricular   relaxation (grade 1 diastolic dysfunction). - Mitral valve: Calcified annulus. Mildly thickened leaflets . - Left atrium: The atrium was moderately dilated.  Impressions:  - Normal LV systolic function; grade 1 diastolic dysfunction;   moderate LAE; mild TR.   Assessment and Plan: 1.Paraxysmal afib/flutter Pt is currently out of rhythm, aware of it Sunday, but cannot tell me if it is persistent or paroxysmal 5 day zio patch  Will increase metoprolol 25 mg bid 1 1/2 tabs bid, this may help restore SR Continue Cardizem 90 mg bid( pt has been on this for years) Continue warfarin for chadsvasc score of at least 5 INR today  2. HTN Stable  Plan will be put in place after monitor reviewed, hopefully can get her rhythm back to normal before her cruise, if persistent she will probably have to pursue a TEE guided cardioversion vrs 4 weekly INR's for her to make her cruise.  Addendum- 3/13- monitor did show persistent afib. She does want to go ahead with cardioversion. She is not going  on cruise and wants to have the weekly therapeutic INR's before proceeding with cardioversion vrs TEE guided.   Geroge Baseman Ksean Vale, [facility] [address] [place], [place] [address] [phone]

## 2019-02-22 NOTE — Patient Instructions (Signed)
Your physician has recommended you make the following change in your medication: Increase Metoprolol to 1.5 tablets; 37.5 mg twice a day   Wear your monitor for 5 days and take off to mail 02/27/2019.

## 2019-02-24 ENCOUNTER — Ambulatory Visit (INDEPENDENT_AMBULATORY_CARE_PROVIDER_SITE_OTHER): Payer: Medicare HMO | Admitting: Family Medicine

## 2019-02-24 ENCOUNTER — Encounter: Payer: Self-pay | Admitting: Family Medicine

## 2019-02-24 ENCOUNTER — Other Ambulatory Visit: Payer: Self-pay

## 2019-02-24 VITALS — BP 118/82 | HR 79 | Temp 97.7°F | Resp 14 | Ht 64.0 in | Wt 196.0 lb

## 2019-02-24 DIAGNOSIS — I4891 Unspecified atrial fibrillation: Secondary | ICD-10-CM

## 2019-02-24 DIAGNOSIS — E1142 Type 2 diabetes mellitus with diabetic polyneuropathy: Secondary | ICD-10-CM | POA: Diagnosis not present

## 2019-02-24 DIAGNOSIS — E785 Hyperlipidemia, unspecified: Secondary | ICD-10-CM

## 2019-02-24 LAB — BASIC METABOLIC PANEL
BUN: 14 mg/dL (ref 6–23)
CO2: 29 mEq/L (ref 19–32)
Calcium: 8.9 mg/dL (ref 8.4–10.5)
Chloride: 93 mEq/L — ABNORMAL LOW (ref 96–112)
Creatinine, Ser: 1.16 mg/dL (ref 0.40–1.20)
GFR: 45.23 mL/min — ABNORMAL LOW (ref >60.00)
GLUCOSE: 162 mg/dL — AB (ref 70–99)
Potassium: 3.9 mEq/L (ref 3.5–5.1)
Sodium: 130 mEq/L — ABNORMAL LOW (ref 135–145)

## 2019-02-24 LAB — HEMOGLOBIN A1C: Hgb A1c MFr Bld: 7.5 % — ABNORMAL HIGH (ref 4.6–6.5)

## 2019-02-24 MED ORDER — PRAVASTATIN SODIUM 40 MG PO TABS: 40.0000 mg | ORAL_TABLET | Freq: Every day | ORAL | 3 refills | Status: DC

## 2019-02-24 NOTE — Patient Instructions (Signed)
Schedule your complete physical in 3-4 months We'll notify you of your lab results and make any changes if needed RESTART the Pravastatin nightly for cholesterol Continue to work on healthy diet- you can do it! Have your eye doctor send me a copy of their note Call with any questions or concerns We'll pray for that cruise!!

## 2019-02-24 NOTE — Progress Notes (Signed)
   Subjective:    Patient ID: Melanie [name], female    DOB: 06/17/1941, 78 y.o.   MRN: [id number]  HPI  DM- chronic problem, on Metformin 500mg  BID.  Eye exam scheduled for tomorrow.  UTD on foot exam.  On ACE for renal protection. Pt reports ongoing dizziness- wearing a heart monitor.  Denies HAs, visual changes, CP, SOB above baseline, abd pain, N/V.  + neuropathy in feet bilaterally.  Hyperlipidemia- pt is no longer taking Crestor b/c she didn't like how it made her feel.  Last LDL 175.  Pt was previously on Pravastatin w/o difficulty.  Review of Systems For ROS see HPI     Objective:   Physical Exam Vitals signs reviewed.  Constitutional:      General: She is not in acute distress.    Appearance: She is well-developed.  HENT:     Head: Normocephalic and atraumatic.  Eyes:     Conjunctiva/sclera: Conjunctivae normal.     Pupils: Pupils are equal, round, and reactive to light.  Neck:     Musculoskeletal: Normal range of motion and neck supple.     Thyroid: No thyromegaly.  Cardiovascular:     Rate and Rhythm: Normal rate. Rhythm irregular.     Heart sounds: Normal heart sounds. No murmur.  Pulmonary:     Effort: Pulmonary effort is normal. No respiratory distress.     Breath sounds: Normal breath sounds.  Abdominal:     General: There is no distension.     Palpations: Abdomen is soft.     Tenderness: There is no abdominal tenderness.  Lymphadenopathy:     Cervical: No cervical adenopathy.  Skin:    General: Skin is warm and dry.  Neurological:     Mental Status: She is alert and oriented to person, place, and time.  Psychiatric:        Behavior: Behavior normal.           Assessment & Plan:

## 2019-02-25 ENCOUNTER — Ambulatory Visit (HOSPITAL_COMMUNITY): Payer: Medicare HMO | Admitting: Nurse Practitioner

## 2019-02-25 ENCOUNTER — Other Ambulatory Visit: Payer: Self-pay | Admitting: *Deleted

## 2019-02-25 ENCOUNTER — Telehealth: Payer: Self-pay | Admitting: Family Medicine

## 2019-02-25 DIAGNOSIS — E871 Hypo-osmolality and hyponatremia: Secondary | ICD-10-CM

## 2019-02-25 NOTE — Telephone Encounter (Signed)
Copied from [place] [id number]. Topic: Quick Communication - Lab Results (Clinic Use ONLY) >> Feb 25, 2019  8:45 AM Katina Dung, CMA wrote: Called patient to inform them of  lab results. When patient returns call, triage nurse may disclose results.

## 2019-02-25 NOTE — Assessment & Plan Note (Signed)
Ongoing issue for pt.  LDL is far too high- especially for someone w/ DM.  She did not tolerate Crestor b/c she 'didn't like how it made me feel'.  She was previously on Pravastatin w/o difficulty.  Discussed that this is not as potent/effective but I agree that some statin is better than no statin.  Will restart.

## 2019-02-25 NOTE — Assessment & Plan Note (Signed)
Following w/ Afib clinic.  Currently has monitor in place.  Pt is currently in Afib.  On anticoagulation.  They are debating a cardioversion.  Pt is very stressed by this as she is afraid she is going to miss her upcoming cruise.

## 2019-02-25 NOTE — Assessment & Plan Note (Signed)
Chronic problem.  UTD on foot exam, on ACE.  Eye exam scheduled for tomorrow.  Tolerating Metformin w/o difficulty.  Check labs.  Adjust meds prn

## 2019-03-02 ENCOUNTER — Other Ambulatory Visit: Payer: Medicare HMO

## 2019-03-03 ENCOUNTER — Encounter: Payer: Self-pay | Admitting: Podiatry

## 2019-03-03 ENCOUNTER — Ambulatory Visit: Payer: Medicare HMO | Admitting: *Deleted

## 2019-03-03 ENCOUNTER — Ambulatory Visit: Payer: Medicare HMO | Admitting: Podiatry

## 2019-03-03 ENCOUNTER — Other Ambulatory Visit: Payer: Self-pay

## 2019-03-03 ENCOUNTER — Other Ambulatory Visit: Payer: Medicare HMO

## 2019-03-03 VITALS — BP 100/61 | HR 80 | Resp 16

## 2019-03-03 DIAGNOSIS — I4891 Unspecified atrial fibrillation: Secondary | ICD-10-CM | POA: Diagnosis not present

## 2019-03-03 DIAGNOSIS — L84 Corns and callosities: Secondary | ICD-10-CM

## 2019-03-03 DIAGNOSIS — M79675 Pain in left toe(s): Secondary | ICD-10-CM | POA: Diagnosis not present

## 2019-03-03 DIAGNOSIS — I480 Paroxysmal atrial fibrillation: Secondary | ICD-10-CM | POA: Diagnosis not present

## 2019-03-03 DIAGNOSIS — E1142 Type 2 diabetes mellitus with diabetic polyneuropathy: Secondary | ICD-10-CM

## 2019-03-03 DIAGNOSIS — Z5181 Encounter for therapeutic drug level monitoring: Secondary | ICD-10-CM

## 2019-03-03 DIAGNOSIS — M79674 Pain in right toe(s): Secondary | ICD-10-CM | POA: Diagnosis not present

## 2019-03-03 DIAGNOSIS — B351 Tinea unguium: Secondary | ICD-10-CM

## 2019-03-03 LAB — POCT INR: INR: 2 (ref 2.0–3.0)

## 2019-03-03 NOTE — Patient Instructions (Signed)
Description   Today take 7.5mg  tablets then continue on same dosage 7.5mg  daily except 5mg  on Tuesdays, Thursdays, and Saturdays.  Recheck INR in 1 week pending Cardioversion.  Call  Anticoagulation Clinic [phone] Main [phone] if have any change in medications or scheduled for any procedures

## 2019-03-03 NOTE — Patient Instructions (Signed)

## 2019-03-04 ENCOUNTER — Other Ambulatory Visit (HOSPITAL_COMMUNITY): Payer: Self-pay | Admitting: *Deleted

## 2019-03-04 DIAGNOSIS — I4819 Other persistent atrial fibrillation: Secondary | ICD-10-CM

## 2019-03-04 NOTE — Addendum Note (Signed)
Encounter addended by: Sherran Needs, NP on: 03/04/2019 1:33 PM  Actions taken: Clinical Note Signed

## 2019-03-09 ENCOUNTER — Telehealth: Payer: Self-pay | Admitting: Internal Medicine

## 2019-03-09 NOTE — Telephone Encounter (Signed)
Left msg on VM Labs set for tomorrow Note cardioversion set for 4/2 With corona virus situation want to minimize patient exposure risk   Will review with D Kayleen Memos and Ashok Norris before rescheduling date Someone from office will call once they have made determination

## 2019-03-09 NOTE — Telephone Encounter (Signed)
I would prefer to wait on DCCV for at least 1 month - [name] was she very symptomatic?  Melanie [name]

## 2019-03-10 ENCOUNTER — Other Ambulatory Visit: Payer: Medicare HMO

## 2019-03-10 ENCOUNTER — Other Ambulatory Visit: Payer: Self-pay

## 2019-03-10 ENCOUNTER — Ambulatory Visit: Payer: Medicare HMO | Admitting: *Deleted

## 2019-03-10 DIAGNOSIS — I4891 Unspecified atrial fibrillation: Secondary | ICD-10-CM | POA: Diagnosis not present

## 2019-03-10 DIAGNOSIS — Z5181 Encounter for therapeutic drug level monitoring: Secondary | ICD-10-CM | POA: Diagnosis not present

## 2019-03-10 LAB — POCT INR: INR: 2.1 (ref 2.0–3.0)

## 2019-03-10 NOTE — Telephone Encounter (Signed)
She is symptomatic and undergoing weekly INR's. Since she is not scheduled for like 3 weeks out, I feel we need to keep scheduled for now.

## 2019-03-10 NOTE — Telephone Encounter (Signed)
She is actually scheduled for 2 weeks out - can we put her out 3 weeks?

## 2019-03-10 NOTE — Patient Instructions (Addendum)
Description   Today take 7.5mg  tablets then continue taking 7.5mg  daily except 5mg  on Tuesdays, Thursdays, and Saturdays.  Recheck INR in 1 week pending Cardioversion.  Call  [facility] [phone] Main [phone] if have any change in medications or scheduled for any procedures

## 2019-03-10 NOTE — Telephone Encounter (Signed)
Will defer any changes to T Turner and D Kayleen Memos Will stay out of message routing

## 2019-03-13 ENCOUNTER — Encounter: Payer: Self-pay | Admitting: Podiatry

## 2019-03-13 NOTE — Progress Notes (Signed)
Subjective: Melanie [name] presents with diabetes, diabetic neuropathy and cc of painful, discolored, thick toenails and painful callus/corn which interfere with activities of daily living. Pain is aggravated when wearing enclosed shoe gear. Pain is relieved with periodic professional debridement.  [name], MD is her PCP.    Current Outpatient Medications:  .  Ascorbic Acid (VITAMIN C) 1000 MG tablet, Take 1,000 mg by mouth 2 (two) times daily., Disp: , Rfl:  .  Calcium Carb-Cholecalciferol (CALTRATE 600+D3 SOFT PO), Take 1 tablet by mouth 2 (two) times daily., Disp: , Rfl:  .  CRANBERRY PO, Take 2 tablets by mouth daily., Disp: , Rfl:  .  Cyanocobalamin (VITAMIN B-12) 1000 MCG SUBL, Place 1 each under the tongue daily., Disp: , Rfl:  .  diltiazem (CARDIZEM) 90 MG tablet, TAKE 1 TABLET (90 MG TOTAL) BY MOUTH 2 (TWO) TIMES DAILY., Disp: 180 tablet, Rfl: 1 .  fish oil-omega-3 fatty acids 1000 MG capsule, Take 2 g by mouth daily., Disp: , Rfl:  .  fluticasone (FLONASE) 50 MCG/ACT nasal spray, Place 2 sprays into both nostrils daily as needed for allergies or rhinitis., Disp: , Rfl:  .  furosemide (LASIX) 20 MG tablet, TAKE 1 TABLET BY MOUTH EVERY DAY, Disp: 90 tablet, Rfl: 1 .  hydrocortisone 2.5 % cream, APPLY TO THE FACE TWICE A DAY AS NEEDED 1 WEEK ON THEN 1 WEEK OFF, Disp: , Rfl: 0 .  lisinopril-hydrochlorothiazide (PRINZIDE,ZESTORETIC) 20-12.5 MG tablet, TAKE 1 TABLET BY MOUTH EVERY DAY, Disp: 90 tablet, Rfl: 1 .  loperamide (IMODIUM A-D) 2 MG capsule, Take by mouth as needed for diarrhea or loose stools., Disp: , Rfl:  .  metFORMIN (GLUCOPHAGE) 500 MG tablet, TAKE 1 TABLET (500 MG TOTAL) BY MOUTH 2 (TWO) TIMES DAILY WITH A MEAL., Disp: 180 tablet, Rfl: 1 .  metoprolol tartrate (LOPRESSOR) 25 MG tablet, Take 1.5 tablets (37.5 mg total) by mouth 2 (two) times daily., Disp: 180 tablet, Rfl: 1 .  omeprazole (PRILOSEC) 20 MG capsule, Take 1 capsule (20 mg total) by mouth daily.,  Disp: 30 capsule, Rfl: 3 .  pravastatin (PRAVACHOL) 40 MG tablet, Take 1 tablet (40 mg total) by mouth daily., Disp: 90 tablet, Rfl: 3 .  Probiotic Product (PROBIOTIC PO), Take 1 capsule by mouth daily. , Disp: , Rfl:  .  vitamin E 100 UNIT capsule, Take by mouth., Disp: , Rfl:  .  warfarin (COUMADIN) 5 MG tablet, TAKE AS DIRECTED BY COUMADIN CLINIC, Disp: 120 tablet, Rfl: 1  Allergies  Allergen Reactions  . Niacin And Related Rash  . Keflex [Cephalexin] Other (See Comments)    Fatigue, sore throat, body aches  . Sausage [Pickled Meat] Other (See Comments)    Drowsiness and dizziness  . Tape Rash    Bandaids     Vascular Examination: Capillary refill time <3 seconds x 10 digits.  Dorsalis pedis pulses present b/l.  Posterior tibial pulses present b/l.  No digital hair x 10 digits.  Skin temperature gradient WNL b/l.  Dermatological Examination: Skin with normal turgor, texture and tone b/l.  Toenails 1-5 b/l discolored, thick, dystrophic with subungual debris and pain with palpation to nailbeds due to thickness of nails.  Hyperkeratotic lesion submet head 2nd left foot with subdermal hemorrhage. No erythema, no edema, no drainage, no flocculence noted.   Cracked fissure left hallux. No erythema, no edema, no drainage.  Musculoskeletal: Muscle strength 5/5 to all LE muscle groups  HAV with bunion b/l  Hammertoes 2-5 b/l.  Neurological: Sensation diminished with 10 gram monofilament.  Assessment: 1. Painful onychomycosis toenails 1-5 b/l 2. Fissure left hallux 3. Callus submet head 2nd left foot 4. NIDDM with Diabetic neuropathy  Plan: 1. Continue diabetic foot care principles. Literature dispensed on today. 2. Toenails 1-5 b/l were debrided in length and girth without iatrogenic bleeding. 3. Calluses pared submetatarsal head(s) 2nd left foot utilizing sterile scalpel blade without incident.  4. Patient to apply Polysporin to left hallux once daily x 1  week. 5. Patient to continue soft, supportive shoe gear 6. Patient to report any pedal injuries to medical professional  7. Follow up 3 months.  8. Patient/POA to call should there be a concern in the interim.

## 2019-03-21 ENCOUNTER — Other Ambulatory Visit: Payer: Self-pay | Admitting: Family Medicine

## 2019-03-24 ENCOUNTER — Encounter (HOSPITAL_COMMUNITY): Admission: RE | Payer: Self-pay | Source: Home / Self Care

## 2019-03-24 ENCOUNTER — Ambulatory Visit (HOSPITAL_COMMUNITY): Admission: RE | Admit: 2019-03-24 | Payer: Medicare HMO | Source: Home / Self Care | Admitting: Cardiology

## 2019-03-24 SURGERY — CARDIOVERSION
Anesthesia: General

## 2019-03-25 ENCOUNTER — Other Ambulatory Visit: Payer: Self-pay | Admitting: Cardiovascular Disease

## 2019-03-29 ENCOUNTER — Telehealth: Payer: Self-pay | Admitting: Family Medicine

## 2019-03-29 NOTE — Telephone Encounter (Signed)
Pt states that she is due to for bw, can we just  Schedule a lab appt or do we need to schedule a VV with the pt first.

## 2019-03-30 NOTE — Telephone Encounter (Signed)
LM asking pt to call back to schedule a lab only appt.

## 2019-03-30 NOTE — Telephone Encounter (Signed)
She was supposed to have a lab visit to recheck her sodium level.  [place] for lab only

## 2019-03-31 ENCOUNTER — Other Ambulatory Visit (INDEPENDENT_AMBULATORY_CARE_PROVIDER_SITE_OTHER): Payer: Medicare HMO

## 2019-03-31 ENCOUNTER — Ambulatory Visit (HOSPITAL_COMMUNITY): Payer: Medicare HMO | Admitting: Nurse Practitioner

## 2019-03-31 DIAGNOSIS — E871 Hypo-osmolality and hyponatremia: Secondary | ICD-10-CM | POA: Diagnosis not present

## 2019-03-31 LAB — BASIC METABOLIC PANEL WITH GFR
BUN: 15 mg/dL (ref 6–23)
CO2: 25 meq/L (ref 19–32)
Calcium: 9.4 mg/dL (ref 8.4–10.5)
Chloride: 94 meq/L — ABNORMAL LOW (ref 96–112)
Creatinine, Ser: 1.13 mg/dL (ref 0.40–1.20)
GFR: 46.6 mL/min — ABNORMAL LOW
Glucose, Bld: 155 mg/dL — ABNORMAL HIGH (ref 70–99)
Potassium: 4.3 meq/L (ref 3.5–5.1)
Sodium: 132 meq/L — ABNORMAL LOW (ref 135–145)

## 2019-04-02 ENCOUNTER — Telehealth: Payer: Medicare HMO | Admitting: Nurse Practitioner

## 2019-04-02 DIAGNOSIS — N3000 Acute cystitis without hematuria: Secondary | ICD-10-CM

## 2019-04-02 MED ORDER — SULFAMETHOXAZOLE-TRIMETHOPRIM 800-160 MG PO TABS: 1.0000 | ORAL_TABLET | Freq: Two times a day (BID) | ORAL | 0 refills | Status: DC

## 2019-04-02 NOTE — Progress Notes (Signed)
We are sorry that you are not feeling well.  Here is how we plan to help!  Based on what you shared with me it looks like you most likely have a simple urinary tract infection.  A UTI (Urinary Tract Infection) is a bacterial infection of the bladder.  Most cases of urinary tract infections are simple to treat but a key part of your care is to encourage you to drink plenty of fluids and watch your symptoms carefully.  I have prescribed Bactrim DS One tablet twice a day for 5 days.  Your symptoms should gradually improve. Call us if the burning in your urine worsens, you develop worsening fever, back pain or pelvic pain or if your symptoms do not resolve after completing the antibiotic.  * You will need to have your coumadin level ( INR) checked MOnday or Tuesday next week. Antibiotics can affect INR  Urinary tract infections can be prevented by drinking plenty of water to keep your body hydrated.  Also be sure when you wipe, wipe from front to back and don't hold it in!  If possible, empty your bladder every 4 hours.  Your e-visit answers were reviewed by a board certified advanced clinical practitioner to complete your personal care plan.  Depending on the condition, your plan could have included both over the counter or prescription medications.  If there is a problem please reply  once you have received a response from your provider.  Your safety is important to [place].  If you have drug allergies check your prescription carefully.    You can use MyChart to ask questions about today's visit, request a non-urgent call back, or ask for a work or school excuse for 24 hours related to this e-Visit. If it has been greater than 24 hours you will need to follow up with your provider, or enter a new e-Visit to address those concerns.   You will get an e-mail in the next two days asking about your experience.  I hope that your e-visit has been valuable and will speed your recovery. Thank you for using  e-visits.   5 minutes spent reviewing and documenting in chart.

## 2019-04-04 ENCOUNTER — Telehealth: Payer: Self-pay | Admitting: Pharmacist

## 2019-04-04 NOTE — Telephone Encounter (Signed)
1. Do you currently have a fever? no (yes = cancel and refer to pcp for e-visit) 2. Have you recently travelled on a cruise, internationally, or to [place], [place], [place], [place], [place], or [place], [place] [employer]) ? no (yes = cancel, stay home, monitor symptoms, and contact pcp or initiate e-visit if symptoms develop) 3. Have you been in contact with someone that is currently pending confirmation of Covid19 testing or has been confirmed to have the [place] virus?  no (yes = cancel, stay home, away from tested individual, monitor symptoms, and contact pcp or initiate e-visit if symptoms develop) 4. Are you currently experiencing fatigue or cough? no (yes = pt should be prepared to have a mask placed at the time of their visit).  Pt. Advised that we are restricting visitors at this time and anyone present in the vehicle should meet the above criteria as well. Advised that visit will be at curbside for finger stick ONLY and will receive call with instructions. Pt also advised to please bring own pen for signature of arrival document.   Patient being started on bactrim. Started on 04/03/2019 for 5 days. Pt cannot come tomorrow. She will come 04/06/2019 for INR check due to DD interaction. Advised to eat some greens

## 2019-04-06 ENCOUNTER — Other Ambulatory Visit: Payer: Self-pay

## 2019-04-06 ENCOUNTER — Ambulatory Visit (INDEPENDENT_AMBULATORY_CARE_PROVIDER_SITE_OTHER): Payer: Medicare HMO | Admitting: Pharmacist

## 2019-04-06 DIAGNOSIS — I4891 Unspecified atrial fibrillation: Secondary | ICD-10-CM

## 2019-04-06 DIAGNOSIS — Z5181 Encounter for therapeutic drug level monitoring: Secondary | ICD-10-CM

## 2019-04-06 LAB — POCT INR: INR: 3.7 — AB (ref 2.0–3.0)

## 2019-04-07 ENCOUNTER — Other Ambulatory Visit: Payer: Self-pay | Admitting: Family Medicine

## 2019-04-22 ENCOUNTER — Encounter: Payer: Medicare HMO | Admitting: Family Medicine

## 2019-04-28 DIAGNOSIS — E119 Type 2 diabetes mellitus without complications: Secondary | ICD-10-CM | POA: Diagnosis not present

## 2019-04-28 DIAGNOSIS — H52203 Unspecified astigmatism, bilateral: Secondary | ICD-10-CM | POA: Diagnosis not present

## 2019-04-28 LAB — HM DIABETES EYE EXAM

## 2019-05-03 ENCOUNTER — Telehealth: Payer: Self-pay

## 2019-05-03 NOTE — Telephone Encounter (Signed)

## 2019-05-04 ENCOUNTER — Encounter: Payer: Self-pay | Admitting: General Practice

## 2019-05-05 ENCOUNTER — Other Ambulatory Visit: Payer: Self-pay

## 2019-05-05 ENCOUNTER — Ambulatory Visit (INDEPENDENT_AMBULATORY_CARE_PROVIDER_SITE_OTHER): Payer: Medicare HMO | Admitting: *Deleted

## 2019-05-05 DIAGNOSIS — I4891 Unspecified atrial fibrillation: Secondary | ICD-10-CM | POA: Diagnosis not present

## 2019-05-05 DIAGNOSIS — Z5181 Encounter for therapeutic drug level monitoring: Secondary | ICD-10-CM

## 2019-05-05 LAB — POCT INR: INR: 3.3 — AB (ref 2.0–3.0)

## 2019-05-22 ENCOUNTER — Emergency Department (HOSPITAL_COMMUNITY): Payer: Medicare HMO

## 2019-05-22 ENCOUNTER — Other Ambulatory Visit: Payer: Self-pay

## 2019-05-22 ENCOUNTER — Encounter (HOSPITAL_COMMUNITY): Payer: Self-pay | Admitting: *Deleted

## 2019-05-22 ENCOUNTER — Inpatient Hospital Stay (HOSPITAL_COMMUNITY)
Admission: EM | Admit: 2019-05-22 | Discharge: 2019-06-13 | DRG: 871 | Disposition: A | Discharge status: Hospice | Payer: Medicare HMO | Attending: Internal Medicine | Admitting: Internal Medicine

## 2019-05-22 DIAGNOSIS — R110 Nausea: Secondary | ICD-10-CM

## 2019-05-22 DIAGNOSIS — E1151 Type 2 diabetes mellitus with diabetic peripheral angiopathy without gangrene: Secondary | ICD-10-CM | POA: Diagnosis present

## 2019-05-22 DIAGNOSIS — I82622 Acute embolism and thrombosis of deep veins of left upper extremity: Secondary | ICD-10-CM | POA: Diagnosis not present

## 2019-05-22 DIAGNOSIS — E876 Hypokalemia: Secondary | ICD-10-CM | POA: Diagnosis present

## 2019-05-22 DIAGNOSIS — E870 Hyperosmolality and hypernatremia: Secondary | ICD-10-CM | POA: Diagnosis not present

## 2019-05-22 DIAGNOSIS — J9601 Acute respiratory failure with hypoxia: Secondary | ICD-10-CM | POA: Diagnosis not present

## 2019-05-22 DIAGNOSIS — K8020 Calculus of gallbladder without cholecystitis without obstruction: Secondary | ICD-10-CM

## 2019-05-22 DIAGNOSIS — E785 Hyperlipidemia, unspecified: Secondary | ICD-10-CM | POA: Diagnosis present

## 2019-05-22 DIAGNOSIS — K5900 Constipation, unspecified: Secondary | ICD-10-CM | POA: Diagnosis not present

## 2019-05-22 DIAGNOSIS — R0602 Shortness of breath: Secondary | ICD-10-CM

## 2019-05-22 DIAGNOSIS — I4892 Unspecified atrial flutter: Secondary | ICD-10-CM | POA: Diagnosis present

## 2019-05-22 DIAGNOSIS — Z833 Family history of diabetes mellitus: Secondary | ICD-10-CM

## 2019-05-22 DIAGNOSIS — J90 Pleural effusion, not elsewhere classified: Secondary | ICD-10-CM | POA: Diagnosis not present

## 2019-05-22 DIAGNOSIS — I82409 Acute embolism and thrombosis of unspecified deep veins of unspecified lower extremity: Secondary | ICD-10-CM | POA: Diagnosis not present

## 2019-05-22 DIAGNOSIS — R1084 Generalized abdominal pain: Secondary | ICD-10-CM

## 2019-05-22 DIAGNOSIS — K8001 Calculus of gallbladder with acute cholecystitis with obstruction: Secondary | ICD-10-CM | POA: Diagnosis not present

## 2019-05-22 DIAGNOSIS — K8511 Biliary acute pancreatitis with uninfected necrosis: Secondary | ICD-10-CM | POA: Diagnosis not present

## 2019-05-22 DIAGNOSIS — N183 Chronic kidney disease, stage 3 (moderate): Secondary | ICD-10-CM | POA: Diagnosis present

## 2019-05-22 DIAGNOSIS — I130 Hypertensive heart and chronic kidney disease with heart failure and stage 1 through stage 4 chronic kidney disease, or unspecified chronic kidney disease: Secondary | ICD-10-CM | POA: Diagnosis present

## 2019-05-22 DIAGNOSIS — I82612 Acute embolism and thrombosis of superficial veins of left upper extremity: Secondary | ICD-10-CM | POA: Diagnosis not present

## 2019-05-22 DIAGNOSIS — F0390 Unspecified dementia without behavioral disturbance: Secondary | ICD-10-CM | POA: Diagnosis present

## 2019-05-22 DIAGNOSIS — E1140 Type 2 diabetes mellitus with diabetic neuropathy, unspecified: Secondary | ICD-10-CM | POA: Diagnosis not present

## 2019-05-22 DIAGNOSIS — K8500 Idiopathic acute pancreatitis without necrosis or infection: Secondary | ICD-10-CM | POA: Diagnosis not present

## 2019-05-22 DIAGNOSIS — Z4659 Encounter for fitting and adjustment of other gastrointestinal appliance and device: Secondary | ICD-10-CM

## 2019-05-22 DIAGNOSIS — I10 Essential (primary) hypertension: Secondary | ICD-10-CM | POA: Diagnosis not present

## 2019-05-22 DIAGNOSIS — D62 Acute posthemorrhagic anemia: Secondary | ICD-10-CM | POA: Diagnosis not present

## 2019-05-22 DIAGNOSIS — I959 Hypotension, unspecified: Secondary | ICD-10-CM | POA: Diagnosis not present

## 2019-05-22 DIAGNOSIS — K8010 Calculus of gallbladder with chronic cholecystitis without obstruction: Secondary | ICD-10-CM | POA: Diagnosis present

## 2019-05-22 DIAGNOSIS — I5031 Acute diastolic (congestive) heart failure: Secondary | ICD-10-CM | POA: Diagnosis not present

## 2019-05-22 DIAGNOSIS — J9811 Atelectasis: Secondary | ICD-10-CM | POA: Diagnosis not present

## 2019-05-22 DIAGNOSIS — Z7189 Other specified counseling: Secondary | ICD-10-CM | POA: Diagnosis not present

## 2019-05-22 DIAGNOSIS — J189 Pneumonia, unspecified organism: Secondary | ICD-10-CM | POA: Diagnosis present

## 2019-05-22 DIAGNOSIS — R54 Age-related physical debility: Secondary | ICD-10-CM | POA: Diagnosis present

## 2019-05-22 DIAGNOSIS — Z6835 Body mass index (BMI) 35.0-35.9, adult: Secondary | ICD-10-CM

## 2019-05-22 DIAGNOSIS — R4702 Dysphasia: Secondary | ICD-10-CM | POA: Diagnosis not present

## 2019-05-22 DIAGNOSIS — E1149 Type 2 diabetes mellitus with other diabetic neurological complication: Secondary | ICD-10-CM | POA: Diagnosis present

## 2019-05-22 DIAGNOSIS — I482 Chronic atrial fibrillation, unspecified: Secondary | ICD-10-CM | POA: Diagnosis not present

## 2019-05-22 DIAGNOSIS — M2550 Pain in unspecified joint: Secondary | ICD-10-CM | POA: Diagnosis not present

## 2019-05-22 DIAGNOSIS — K8011 Calculus of gallbladder with chronic cholecystitis with obstruction: Secondary | ICD-10-CM | POA: Diagnosis not present

## 2019-05-22 DIAGNOSIS — J9621 Acute and chronic respiratory failure with hypoxia: Secondary | ICD-10-CM | POA: Diagnosis not present

## 2019-05-22 DIAGNOSIS — K859 Acute pancreatitis without necrosis or infection, unspecified: Secondary | ICD-10-CM | POA: Diagnosis not present

## 2019-05-22 DIAGNOSIS — N281 Cyst of kidney, acquired: Secondary | ICD-10-CM | POA: Diagnosis not present

## 2019-05-22 DIAGNOSIS — D729 Disorder of white blood cells, unspecified: Secondary | ICD-10-CM | POA: Diagnosis not present

## 2019-05-22 DIAGNOSIS — E874 Mixed disorder of acid-base balance: Secondary | ICD-10-CM | POA: Diagnosis not present

## 2019-05-22 DIAGNOSIS — Z8719 Personal history of other diseases of the digestive system: Secondary | ICD-10-CM

## 2019-05-22 DIAGNOSIS — Z8249 Family history of ischemic heart disease and other diseases of the circulatory system: Secondary | ICD-10-CM

## 2019-05-22 DIAGNOSIS — R1110 Vomiting, unspecified: Secondary | ICD-10-CM | POA: Diagnosis not present

## 2019-05-22 DIAGNOSIS — I5033 Acute on chronic diastolic (congestive) heart failure: Secondary | ICD-10-CM | POA: Diagnosis not present

## 2019-05-22 DIAGNOSIS — Z9889 Other specified postprocedural states: Secondary | ICD-10-CM | POA: Diagnosis not present

## 2019-05-22 DIAGNOSIS — E875 Hyperkalemia: Secondary | ICD-10-CM | POA: Diagnosis not present

## 2019-05-22 DIAGNOSIS — Z20828 Contact with and (suspected) exposure to other viral communicable diseases: Secondary | ICD-10-CM | POA: Diagnosis not present

## 2019-05-22 DIAGNOSIS — I4819 Other persistent atrial fibrillation: Secondary | ICD-10-CM | POA: Diagnosis not present

## 2019-05-22 DIAGNOSIS — T17908D Unspecified foreign body in respiratory tract, part unspecified causing other injury, subsequent encounter: Secondary | ICD-10-CM | POA: Diagnosis not present

## 2019-05-22 DIAGNOSIS — K222 Esophageal obstruction: Secondary | ICD-10-CM | POA: Diagnosis present

## 2019-05-22 DIAGNOSIS — Z803 Family history of malignant neoplasm of breast: Secondary | ICD-10-CM

## 2019-05-22 DIAGNOSIS — R6520 Severe sepsis without septic shock: Secondary | ICD-10-CM | POA: Diagnosis not present

## 2019-05-22 DIAGNOSIS — J948 Other specified pleural conditions: Secondary | ICD-10-CM | POA: Diagnosis not present

## 2019-05-22 DIAGNOSIS — I4891 Unspecified atrial fibrillation: Secondary | ICD-10-CM | POA: Diagnosis not present

## 2019-05-22 DIAGNOSIS — I5030 Unspecified diastolic (congestive) heart failure: Secondary | ICD-10-CM | POA: Diagnosis not present

## 2019-05-22 DIAGNOSIS — R400 Somnolence: Secondary | ICD-10-CM | POA: Diagnosis not present

## 2019-05-22 DIAGNOSIS — Z79899 Other long term (current) drug therapy: Secondary | ICD-10-CM

## 2019-05-22 DIAGNOSIS — R1013 Epigastric pain: Secondary | ICD-10-CM | POA: Diagnosis not present

## 2019-05-22 DIAGNOSIS — J309 Allergic rhinitis, unspecified: Secondary | ICD-10-CM | POA: Diagnosis present

## 2019-05-22 DIAGNOSIS — Z7401 Bed confinement status: Secondary | ICD-10-CM | POA: Diagnosis not present

## 2019-05-22 DIAGNOSIS — G9341 Metabolic encephalopathy: Secondary | ICD-10-CM | POA: Diagnosis not present

## 2019-05-22 DIAGNOSIS — I4821 Permanent atrial fibrillation: Secondary | ICD-10-CM | POA: Diagnosis present

## 2019-05-22 DIAGNOSIS — Z7984 Long term (current) use of oral hypoglycemic drugs: Secondary | ICD-10-CM

## 2019-05-22 DIAGNOSIS — R935 Abnormal findings on diagnostic imaging of other abdominal regions, including retroperitoneum: Secondary | ICD-10-CM | POA: Diagnosis not present

## 2019-05-22 DIAGNOSIS — E1142 Type 2 diabetes mellitus with diabetic polyneuropathy: Secondary | ICD-10-CM | POA: Diagnosis not present

## 2019-05-22 DIAGNOSIS — Z7901 Long term (current) use of anticoagulants: Secondary | ICD-10-CM | POA: Diagnosis not present

## 2019-05-22 DIAGNOSIS — N179 Acute kidney failure, unspecified: Secondary | ICD-10-CM | POA: Diagnosis present

## 2019-05-22 DIAGNOSIS — R109 Unspecified abdominal pain: Secondary | ICD-10-CM | POA: Diagnosis not present

## 2019-05-22 DIAGNOSIS — J690 Pneumonitis due to inhalation of food and vomit: Secondary | ICD-10-CM | POA: Diagnosis present

## 2019-05-22 DIAGNOSIS — R4182 Altered mental status, unspecified: Secondary | ICD-10-CM | POA: Diagnosis not present

## 2019-05-22 DIAGNOSIS — J918 Pleural effusion in other conditions classified elsewhere: Secondary | ICD-10-CM | POA: Diagnosis present

## 2019-05-22 DIAGNOSIS — R0902 Hypoxemia: Secondary | ICD-10-CM

## 2019-05-22 DIAGNOSIS — E669 Obesity, unspecified: Secondary | ICD-10-CM | POA: Diagnosis present

## 2019-05-22 DIAGNOSIS — Z515 Encounter for palliative care: Secondary | ICD-10-CM | POA: Diagnosis not present

## 2019-05-22 DIAGNOSIS — K921 Melena: Secondary | ICD-10-CM | POA: Diagnosis not present

## 2019-05-22 DIAGNOSIS — R918 Other nonspecific abnormal finding of lung field: Secondary | ICD-10-CM | POA: Diagnosis not present

## 2019-05-22 DIAGNOSIS — E860 Dehydration: Secondary | ICD-10-CM | POA: Diagnosis present

## 2019-05-22 DIAGNOSIS — T380X5A Adverse effect of glucocorticoids and synthetic analogues, initial encounter: Secondary | ICD-10-CM | POA: Diagnosis not present

## 2019-05-22 DIAGNOSIS — R52 Pain, unspecified: Secondary | ICD-10-CM

## 2019-05-22 DIAGNOSIS — K219 Gastro-esophageal reflux disease without esophagitis: Secondary | ICD-10-CM | POA: Diagnosis present

## 2019-05-22 DIAGNOSIS — J9690 Respiratory failure, unspecified, unspecified whether with hypoxia or hypercapnia: Secondary | ICD-10-CM | POA: Diagnosis not present

## 2019-05-22 DIAGNOSIS — D649 Anemia, unspecified: Secondary | ICD-10-CM | POA: Diagnosis not present

## 2019-05-22 DIAGNOSIS — K851 Biliary acute pancreatitis without necrosis or infection: Secondary | ICD-10-CM | POA: Diagnosis not present

## 2019-05-22 DIAGNOSIS — R609 Edema, unspecified: Secondary | ICD-10-CM | POA: Diagnosis not present

## 2019-05-22 DIAGNOSIS — J80 Acute respiratory distress syndrome: Secondary | ICD-10-CM | POA: Diagnosis not present

## 2019-05-22 DIAGNOSIS — Z66 Do not resuscitate: Secondary | ICD-10-CM | POA: Diagnosis not present

## 2019-05-22 DIAGNOSIS — E8881 Metabolic syndrome: Secondary | ICD-10-CM | POA: Diagnosis present

## 2019-05-22 DIAGNOSIS — R195 Other fecal abnormalities: Secondary | ICD-10-CM | POA: Diagnosis not present

## 2019-05-22 DIAGNOSIS — R000 Tachycardia, unspecified: Secondary | ICD-10-CM | POA: Diagnosis not present

## 2019-05-22 DIAGNOSIS — Z792 Long term (current) use of antibiotics: Secondary | ICD-10-CM

## 2019-05-22 DIAGNOSIS — Z800 Family history of malignant neoplasm of digestive organs: Secondary | ICD-10-CM

## 2019-05-22 DIAGNOSIS — K8000 Calculus of gallbladder with acute cholecystitis without obstruction: Secondary | ICD-10-CM | POA: Diagnosis not present

## 2019-05-22 DIAGNOSIS — Z09 Encounter for follow-up examination after completed treatment for conditions other than malignant neoplasm: Secondary | ICD-10-CM

## 2019-05-22 DIAGNOSIS — E1165 Type 2 diabetes mellitus with hyperglycemia: Secondary | ICD-10-CM | POA: Diagnosis present

## 2019-05-22 DIAGNOSIS — K529 Noninfective gastroenteritis and colitis, unspecified: Secondary | ICD-10-CM | POA: Diagnosis present

## 2019-05-22 DIAGNOSIS — I480 Paroxysmal atrial fibrillation: Secondary | ICD-10-CM | POA: Diagnosis not present

## 2019-05-22 DIAGNOSIS — A419 Sepsis, unspecified organism: Principal | ICD-10-CM | POA: Diagnosis present

## 2019-05-22 DIAGNOSIS — E1122 Type 2 diabetes mellitus with diabetic chronic kidney disease: Secondary | ICD-10-CM | POA: Diagnosis present

## 2019-05-22 HISTORY — DX: Long term (current) use of anticoagulants: Z79.01

## 2019-05-22 LAB — POCT I-STAT EG7
Acid-base deficit: 2 mmol/L (ref 0.0–2.0)
Bicarbonate: 23.8 mmol/L (ref 20.0–28.0)
Calcium, Ion: 1.1 mmol/L — ABNORMAL LOW (ref 1.15–1.40)
HCT: 47 % — ABNORMAL HIGH (ref 36.0–46.0)
Hemoglobin: 16 g/dL — ABNORMAL HIGH (ref 12.0–15.0)
O2 Saturation: 50 %
Potassium: 3.2 mmol/L — ABNORMAL LOW (ref 3.5–5.1)
Sodium: 137 mmol/L (ref 135–145)
TCO2: 25 mmol/L (ref 22–32)
pCO2, Ven: 41.3 mmHg — ABNORMAL LOW (ref 44.0–60.0)
pH, Ven: 7.368 (ref 7.250–7.430)
pO2, Ven: 28 mmHg — CL (ref 32.0–45.0)

## 2019-05-22 LAB — I-STAT CREATININE, ED: Creatinine, Ser: 1.2 mg/dL — ABNORMAL HIGH (ref 0.44–1.00)

## 2019-05-22 MED ORDER — ONDANSETRON HCL 4 MG/2ML IJ SOLN
4.0000 mg | Freq: Once | INTRAMUSCULAR | Status: AC
  Administered 2019-05-22: 4 mg via INTRAVENOUS
  Filled 2019-05-22: qty 2

## 2019-05-22 MED ORDER — FENTANYL CITRATE (PF) 100 MCG/2ML IJ SOLN
100.0000 ug | Freq: Once | INTRAMUSCULAR | Status: AC
  Administered 2019-05-22: 100 ug via INTRAVENOUS
  Filled 2019-05-22: qty 2

## 2019-05-22 MED ORDER — SODIUM CHLORIDE 0.9 % IV BOLUS (SEPSIS): 1000.0000 mL | Freq: Once | INTRAVENOUS | Status: DC

## 2019-05-22 MED ORDER — SODIUM CHLORIDE 0.9% FLUSH: 3.0000 mL | Freq: Once | INTRAVENOUS | Status: DC

## 2019-05-22 MED ORDER — SODIUM CHLORIDE 0.9 % IV BOLUS (SEPSIS)
1000.0000 mL | Freq: Once | INTRAVENOUS | Status: AC
  Administered 2019-05-22: 1000 mL via INTRAVENOUS

## 2019-05-22 NOTE — ED Triage Notes (Signed)
The pt arrived by gems from home  She has had abd pain with n and vomiting for 2 hours   Hx of the same  She has a hiatus hernia

## 2019-05-22 NOTE — ED Provider Notes (Signed)
[facility] EMERGENCY DEPARTMENT Provider Note   CSN: [id number] Arrival date & time: 05/22/19  2159    History   Chief Complaint Chief Complaint  Patient presents with   Abdominal Pain    HPI Melanie [name] is a 78 y.o. female.     The history is provided by the patient.  Abdominal Pain  Pain location:  Generalized Pain quality: aching   Pain radiates to:  Does not radiate Pain severity:  Severe Onset quality:  Sudden Duration:  3 hours Timing:  Constant Progression:  Worsening Chronicity:  New Relieved by:  Nothing Worsened by:  Movement and palpation Associated symptoms: chest pain and vomiting   Associated symptoms: no fever    Patient with history of diabetes, GERD, hypertension, atrial fibrillation on Coumadin presents with abdominal pain.  She reports approximately 3 hours ago she had sudden onset of abdominal pain with vomiting.  She also reports some chest pain.  She thinks this is her hiatal hernia Past Medical History:  Diagnosis Date   Allergic rhinitis    Arthritis    Diabetes mellitus    GERD (gastroesophageal reflux disease)    Hyperlipidemia    Hypertension    Pain in joint, ankle and foot 09/14/2013    Patient Active Problem List   Diagnosis Date Noted   Osteopenia 01/14/2018   Encounter for therapeutic drug monitoring 01/01/2018   Afib ([place]) 01/01/2018   Recurrent cystitis 10/26/2017   Family history of colon cancer 08/28/2015   Dysphagia 08/21/2015   GERD (gastroesophageal reflux disease) 06/14/2015   Onychomycosis 09/14/2013   Pain in joint, ankle and foot 09/14/2013   Annual physical exam 07/06/2012   Colon polyps 07/06/2012   HTN (hypertension) 01/14/2012   DM (diabetes mellitus) type II controlled, neurological manifestation ([place]) 01/14/2012   Neuropathy of lower extremity 01/14/2012   Neck pain 01/14/2012   Hyperlipidemia 01/14/2012   History of colonoscopy with polypectomy  11/06/2011    Past Surgical History:  Procedure Laterality Date   COLONOSCOPY     03/2018, removed polyps   FRACTURE SURGERY Left     leg   hiatal hernia surgery  2010   ROTATOR CUFF REPAIR Right 2010   TONSILLECTOMY       OB History   No obstetric history on file.      Home Medications    Prior to Admission medications   Medication Sig Start Date End Date Taking? Authorizing Provider  Ascorbic Acid (VITAMIN C) 1000 MG tablet Take 1,000 mg by mouth 2 (two) times daily.    [provider]  Calcium Carb-Cholecalciferol (CALTRATE 600+D3 SOFT PO) Take 1 tablet by mouth 2 (two) times daily.    [provider]  CRANBERRY PO Take 2 tablets by mouth daily.    [provider]  Cyanocobalamin (VITAMIN B-12) 1000 MCG SUBL Place 1 each under the tongue daily.    [provider]  diltiazem (CARDIZEM) 90 MG tablet TAKE 1 TABLET (90 MG TOTAL) BY MOUTH 2 (TWO) TIMES DAILY. 04/07/19   [name], MD  fish oil-omega-3 fatty acids 1000 MG capsule Take 2 g by mouth daily.    [provider]  fluticasone (FLONASE) 50 MCG/ACT nasal spray Place 2 sprays into both nostrils daily as needed for allergies or rhinitis.    [provider]  furosemide (LASIX) 20 MG tablet TAKE 1 TABLET BY MOUTH EVERY DAY 02/01/19   [name], MD  hydrocortisone 2.5 % cream APPLY TO  THE FACE TWICE A DAY AS NEEDED 1 WEEK ON THEN 1 WEEK OFF 04/12/18   [provider]  lisinopril-hydrochlorothiazide (PRINZIDE,ZESTORETIC) 20-12.5 MG tablet TAKE 1 TABLET BY MOUTH EVERY DAY 02/01/19   [name], MD  loperamide (IMODIUM A-D) 2 MG capsule Take by mouth as needed for diarrhea or loose stools.    [provider]  metFORMIN (GLUCOPHAGE) 500 MG tablet TAKE 1 TABLET (500 MG TOTAL) BY MOUTH 2 (TWO) TIMES DAILY WITH A MEAL. 12/27/18   [name], MD  metoprolol tartrate (LOPRESSOR) 25 MG tablet Take 1.5 tablets (37.5 mg total) by mouth  2 (two) times daily. 02/22/19   Sherran Needs, NP  omeprazole (PRILOSEC) 20 MG capsule TAKE 1 CAPSULE BY MOUTH EVERY DAY 03/21/19   [name], MD  pravastatin (PRAVACHOL) 40 MG tablet Take 1 tablet (40 mg total) by mouth daily. 02/24/19   [name], MD  Probiotic Product (PROBIOTIC PO) Take 1 capsule by mouth daily.     [provider]  sulfamethoxazole-trimethoprim (BACTRIM DS) 800-160 MG tablet Take 1 tablet by mouth 2 (two) times daily. 04/02/19   Hassell Done Mary-Margaret, FNP  vitamin E 100 UNIT capsule Take by mouth.    [provider]  warfarin (COUMADIN) 5 MG tablet TAKE AS DIRECTED BY COUMADIN CLINIC 03/25/19   Sherren Mocha, MD    Family History Family History  Problem Relation Age of Onset   Colon cancer Brother 9   Colon cancer Maternal Aunt 50   Breast cancer Paternal Grandmother 27   Breast cancer Maternal Aunt 28   Diabetes Maternal Aunt    Heart disease Mother    Stomach cancer Neg Hx     Social History Social History   Tobacco Use   Smoking status: Never Smoker   Smokeless tobacco: Never Used  Substance Use Topics   Alcohol use: No   Drug use: No     Allergies   Niacin and related; Keflex [cephalexin]; Sausage [pickled meat]; and Tape   Review of Systems Review of Systems  Constitutional: Negative for fever.  Cardiovascular: Positive for chest pain.  Gastrointestinal: Positive for abdominal pain and vomiting.  All other systems reviewed and are negative.    Physical Exam Updated Vital Signs BP 124/71    Pulse 76    Temp 97.9 F (36.6 C)    Resp 20    Ht 1.626 m (5\' 4" )    Wt 99.8 kg    SpO2 97%    BMI 37.76 kg/m   Physical Exam CONSTITUTIONAL: Elderly, ill-appearing, actively vomiting HEAD: Normocephalic/atraumatic EYES: EOMI/PERRL, no icterus ENMT: Mucous membranes moist, vomit noted in mouth with blood tinge NECK: supple no meningeal signs SPINE/BACK:entire spine nontender CV: Irregular LUNGS:  Lungs are clear to auscultation bilaterally, no apparent distress ABDOMEN: soft, diffuse moderate tenderness noted, no rebound or guarding, bowel sounds noted throughout abdomen, obese GU:no cva tenderness NEURO: Pt is awake/alert/appropriate, moves all extremitiesx4.  No facial droop.   EXTREMITIES: pulses normal/equal, full ROM SKIN: warm, color normal PSYCH: no abnormalities of mood noted, alert and oriented to situation   ED Treatments / Results  Labs (all labs ordered are listed, but only abnormal results are displayed) Labs Reviewed  COMPREHENSIVE METABOLIC PANEL - Abnormal; Notable for the following components:      Result Value   Potassium 3.3 (*)    Glucose, Bld 240 (*)    Creatinine, Ser 1.30 (*)    GFR calc non Af Amer 40 (*)  GFR calc Af Amer 46 (*)    All other components within normal limits  CBC - Abnormal; Notable for the following components:   WBC 26.7 (*)    Hemoglobin 15.4 (*)    HCT 46.3 (*)    All other components within normal limits  PROTIME-INR - Abnormal; Notable for the following components:   Prothrombin Time 23.1 (*)    INR 2.1 (*)    All other components within normal limits  LACTIC ACID, PLASMA - Abnormal; Notable for the following components:   Lactic Acid, Venous 2.3 (*)    All other components within normal limits  I-STAT CREATININE, ED - Abnormal; Notable for the following components:   Creatinine, Ser 1.20 (*)    All other components within normal limits  POCT I-STAT EG7 - Abnormal; Notable for the following components:   pCO2, Ven 41.3 (*)    pO2, Ven 28.0 (*)    Potassium 3.2 (*)    Calcium, Ion 1.10 (*)    HCT 47.0 (*)    Hemoglobin 16.0 (*)    All other components within normal limits  SARS CORONAVIRUS 2 (HOSPITAL ORDER, [facility])  TROPONIN I  LIPASE, BLOOD  URINALYSIS, ROUTINE W REFLEX MICROSCOPIC  LACTIC ACID, PLASMA    EKG EKG Interpretation  Date/Time:  Sunday May 22 2019 22:07:03  EDT Ventricular Rate:  84 PR Interval:    QRS Duration: 95 QT Interval:  391 QTC Calculation: 463 R Axis:   37 Text Interpretation:  Atrial fibrillation Borderline T abnormalities, inferior leads Baseline wander in lead(s) II Abnormal ekg Confirmed by Ripley Fraise [phone]) on 05/22/2019 11:06:20 PM   Radiology Ct Abdomen Pelvis W Contrast  Result Date: 05/23/2019 CLINICAL DATA:  78 year old female with abdominal pain, nausea vomiting. History of GERD and diabetes and hiatal hernia. EXAM: CT ABDOMEN AND PELVIS WITH CONTRAST TECHNIQUE: Multidetector CT imaging of the abdomen and pelvis was performed using the standard protocol following bolus administration of intravenous contrast. CONTRAST:  24mL OMNIPAQUE IOHEXOL 300 MG/ML  SOLN COMPARISON:  None. FINDINGS: Lower chest: Minimal bibasilar dependent atelectatic changes. There is coronary vascular calcification involving the left circumflex artery. No intra-abdominal free air. Small perihepatic ascites. Hepatobiliary: The liver is unremarkable. There is mild periportal edema. The gallbladder is distended. Small amount of layering stone noted within the gallbladder. No calcified stone identified in the central CBD. Pancreas: There is inflammatory changes of the pancreas with peripancreatic edema most consistent with acute pancreatitis. No abscess or pseudocyst. Spleen: Normal in size without focal abnormality. Adrenals/Urinary Tract: The adrenal glands are unremarkable. There is a 3 cm right renal upper pole cyst as well as additional subcentimeter renal hypodensities which are too small to characterize. There is no hydronephrosis on either side. There is symmetric enhancement and excretion of contrast by both kidneys. The visualized ureters and urinary bladder appear unremarkable. Stomach/Bowel: There is a large hiatal hernia. There is a 2.7 cm diverticulum arising from the third portion of the duodenum. There is colonic diverticulosis without active  inflammatory changes. There is no bowel obstruction or active inflammation. The appendix is normal. Vascular/Lymphatic: Moderate aortoiliac atherosclerotic disease. The IVC is unremarkable. No portal venous gas. There is no adenopathy. Reproductive: The uterus is grossly unremarkable. No pelvic mass. Other: None Musculoskeletal: Degenerative changes of the spine. No acute osseous pathology. IMPRESSION: 1. Acute pancreatitis. No abscess or pseudocyst. 2. Colonic diverticulosis. No bowel obstruction or active inflammation. Normal appendix. 3. Cholelithiasis. 4. Large hiatal hernia. Electronically  Signed   By: Anner Crete M.D.   On: 05/23/2019 00:38   Dg Chest Port 1 View  Result Date: 05/22/2019 CLINICAL DATA:  Abdominal pain, vomiting EXAM: PORTABLE CHEST 1 VIEW COMPARISON:  12/29/2016 FINDINGS: Cardiomegaly. Moderate-sized hiatal hernia. No confluent opacities, effusions or edema. No acute bony abnormality. IMPRESSION: Cardiomegaly.  Moderate-sized hiatal hernia.  No active disease. Electronically Signed   By: Rolm Baptise M.D.   On: 05/22/2019 23:37    Procedures Procedures  CRITICAL CARE Performed by: Sharyon Cable Total critical care time: 45 minutes Critical care time was exclusive of separately billable procedures and treating other patients. Critical care was necessary to treat or prevent imminent or life-threatening deterioration. Critical care was time spent personally by me on the following activities: development of treatment plan with patient and/or surrogate as well as nursing, discussions with consultants, evaluation of patient's response to treatment, examination of patient, obtaining history from patient or surrogate, ordering and performing treatments and interventions, ordering and review of laboratory studies, ordering and review of radiographic studies, pulse oximetry and re-evaluation of patient's condition. Patient with Acute pancreatitis, hypotension requiring IV fluids  and IV antibiotics  Medications Ordered in ED Medications  sodium chloride flush (NS) 0.9 % injection 3 mL (has no administration in time range)  sodium chloride 0.9 % bolus 1,000 mL (has no administration in time range)  piperacillin-tazobactam (ZOSYN) IVPB 3.375 g (has no administration in time range)  insulin aspart (novoLOG) injection 0-9 Units (has no administration in time range)  0.9 %  sodium chloride infusion (has no administration in time range)  HYDROmorphone (DILAUDID) injection 0.5-1 mg (has no administration in time range)  diltiazem (CARDIZEM) tablet 90 mg (has no administration in time range)  metoprolol tartrate (LOPRESSOR) tablet 37.5 mg (has no administration in time range)  piperacillin-tazobactam (ZOSYN) IVPB 3.375 g (has no administration in time range)  acetaminophen (TYLENOL) tablet 650 mg (has no administration in time range)    Or  acetaminophen (TYLENOL) suppository 650 mg (has no administration in time range)  ondansetron (ZOFRAN) tablet 4 mg (has no administration in time range)    Or  ondansetron (ZOFRAN) injection 4 mg (has no administration in time range)  potassium chloride 10 mEq in 100 mL IVPB (has no administration in time range)  ondansetron (ZOFRAN) injection 4 mg (4 mg Intravenous Given 05/22/19 2251)  ondansetron (ZOFRAN) injection 4 mg (4 mg Intravenous Given 05/22/19 2352)  fentaNYL (SUBLIMAZE) injection 100 mcg (100 mcg Intravenous Given 05/22/19 2333)  sodium chloride 0.9 % bolus 1,000 mL (0 mLs Intravenous Stopped 05/23/19 0140)  iohexol (OMNIPAQUE) 300 MG/ML solution 80 mL (80 mLs Intravenous Contrast Given 05/23/19 0006)  fentaNYL (SUBLIMAZE) injection 100 mcg (100 mcg Intravenous Given 05/23/19 0144)     Initial Impression / Assessment and Plan / ED Course  I have reviewed the triage vital signs and the nursing notes.  Pertinent labs & imaging results that were available during my care of the patient were reviewed by me and considered in my  medical decision making (see chart for details).        11:19 PM Presents with generalized abdominal pain and vomiting.  She is ill-appearing.  She will require CT imaging once labs are resulted. 11:56 PM Plan to proceed with CT imaging.  Patient is mildly hypotensive.  IV fluids have been ordered 1:00 AM Patient found to have acute pancreatitis.  Also noted to have cholelithiasis on CT imaging.  LFTs are normal except lipase is  pending. She has a dramatically elevated white count.  She has had intermittent episodes of hypotension. In case there is also concurrent cholecystitis, will start on IV Zosyn. Plan for right upper quadrant ultrasound and admission  Pt does not want me to call any family members 1:50 AM D/w dr Alcario Drought for admission  Final Clinical Impressions(s) / ED Diagnoses   Final diagnoses:  Pain  Idiopathic acute pancreatitis without infection or necrosis  Dehydration    ED Discharge Orders    None       Ripley Fraise, MD 05/23/19 0150

## 2019-05-22 NOTE — ED Notes (Signed)
ims started iv they gave her  zofran iv on the way here  She is actively vomiiting

## 2019-05-23 ENCOUNTER — Emergency Department (HOSPITAL_COMMUNITY): Payer: Medicare HMO

## 2019-05-23 ENCOUNTER — Inpatient Hospital Stay (HOSPITAL_COMMUNITY): Payer: Medicare HMO

## 2019-05-23 DIAGNOSIS — E1142 Type 2 diabetes mellitus with diabetic polyneuropathy: Secondary | ICD-10-CM | POA: Diagnosis not present

## 2019-05-23 DIAGNOSIS — E860 Dehydration: Secondary | ICD-10-CM | POA: Diagnosis not present

## 2019-05-23 DIAGNOSIS — R0602 Shortness of breath: Secondary | ICD-10-CM | POA: Diagnosis not present

## 2019-05-23 DIAGNOSIS — I82622 Acute embolism and thrombosis of deep veins of left upper extremity: Secondary | ICD-10-CM | POA: Diagnosis not present

## 2019-05-23 DIAGNOSIS — R4702 Dysphasia: Secondary | ICD-10-CM | POA: Diagnosis not present

## 2019-05-23 DIAGNOSIS — Z20828 Contact with and (suspected) exposure to other viral communicable diseases: Secondary | ICD-10-CM | POA: Diagnosis not present

## 2019-05-23 DIAGNOSIS — E870 Hyperosmolality and hypernatremia: Secondary | ICD-10-CM | POA: Diagnosis not present

## 2019-05-23 DIAGNOSIS — J948 Other specified pleural conditions: Secondary | ICD-10-CM | POA: Diagnosis not present

## 2019-05-23 DIAGNOSIS — I10 Essential (primary) hypertension: Secondary | ICD-10-CM | POA: Diagnosis not present

## 2019-05-23 DIAGNOSIS — I5030 Unspecified diastolic (congestive) heart failure: Secondary | ICD-10-CM | POA: Diagnosis not present

## 2019-05-23 DIAGNOSIS — I130 Hypertensive heart and chronic kidney disease with heart failure and stage 1 through stage 4 chronic kidney disease, or unspecified chronic kidney disease: Secondary | ICD-10-CM | POA: Diagnosis not present

## 2019-05-23 DIAGNOSIS — R935 Abnormal findings on diagnostic imaging of other abdominal regions, including retroperitoneum: Secondary | ICD-10-CM | POA: Diagnosis not present

## 2019-05-23 DIAGNOSIS — T17908D Unspecified foreign body in respiratory tract, part unspecified causing other injury, subsequent encounter: Secondary | ICD-10-CM | POA: Diagnosis not present

## 2019-05-23 DIAGNOSIS — K8010 Calculus of gallbladder with chronic cholecystitis without obstruction: Secondary | ICD-10-CM | POA: Diagnosis not present

## 2019-05-23 DIAGNOSIS — K921 Melena: Secondary | ICD-10-CM | POA: Diagnosis not present

## 2019-05-23 DIAGNOSIS — K8001 Calculus of gallbladder with acute cholecystitis with obstruction: Secondary | ICD-10-CM | POA: Diagnosis not present

## 2019-05-23 DIAGNOSIS — D649 Anemia, unspecified: Secondary | ICD-10-CM | POA: Diagnosis not present

## 2019-05-23 DIAGNOSIS — K859 Acute pancreatitis without necrosis or infection, unspecified: Secondary | ICD-10-CM | POA: Diagnosis not present

## 2019-05-23 DIAGNOSIS — Z7189 Other specified counseling: Secondary | ICD-10-CM | POA: Diagnosis not present

## 2019-05-23 DIAGNOSIS — R110 Nausea: Secondary | ICD-10-CM | POA: Diagnosis not present

## 2019-05-23 DIAGNOSIS — K8000 Calculus of gallbladder with acute cholecystitis without obstruction: Secondary | ICD-10-CM | POA: Diagnosis not present

## 2019-05-23 DIAGNOSIS — D729 Disorder of white blood cells, unspecified: Secondary | ICD-10-CM | POA: Diagnosis not present

## 2019-05-23 DIAGNOSIS — N281 Cyst of kidney, acquired: Secondary | ICD-10-CM | POA: Diagnosis not present

## 2019-05-23 DIAGNOSIS — E1140 Type 2 diabetes mellitus with diabetic neuropathy, unspecified: Secondary | ICD-10-CM | POA: Diagnosis not present

## 2019-05-23 DIAGNOSIS — I82409 Acute embolism and thrombosis of unspecified deep veins of unspecified lower extremity: Secondary | ICD-10-CM | POA: Diagnosis not present

## 2019-05-23 DIAGNOSIS — Z515 Encounter for palliative care: Secondary | ICD-10-CM | POA: Diagnosis not present

## 2019-05-23 DIAGNOSIS — R918 Other nonspecific abnormal finding of lung field: Secondary | ICD-10-CM | POA: Diagnosis not present

## 2019-05-23 DIAGNOSIS — G9341 Metabolic encephalopathy: Secondary | ICD-10-CM | POA: Diagnosis not present

## 2019-05-23 DIAGNOSIS — J90 Pleural effusion, not elsewhere classified: Secondary | ICD-10-CM | POA: Diagnosis not present

## 2019-05-23 DIAGNOSIS — J9621 Acute and chronic respiratory failure with hypoxia: Secondary | ICD-10-CM | POA: Diagnosis not present

## 2019-05-23 DIAGNOSIS — J9811 Atelectasis: Secondary | ICD-10-CM | POA: Diagnosis not present

## 2019-05-23 DIAGNOSIS — J918 Pleural effusion in other conditions classified elsewhere: Secondary | ICD-10-CM | POA: Diagnosis present

## 2019-05-23 DIAGNOSIS — I82612 Acute embolism and thrombosis of superficial veins of left upper extremity: Secondary | ICD-10-CM | POA: Diagnosis not present

## 2019-05-23 DIAGNOSIS — J9601 Acute respiratory failure with hypoxia: Secondary | ICD-10-CM | POA: Diagnosis not present

## 2019-05-23 DIAGNOSIS — I5031 Acute diastolic (congestive) heart failure: Secondary | ICD-10-CM | POA: Diagnosis not present

## 2019-05-23 DIAGNOSIS — J189 Pneumonia, unspecified organism: Secondary | ICD-10-CM | POA: Diagnosis not present

## 2019-05-23 DIAGNOSIS — I4891 Unspecified atrial fibrillation: Secondary | ICD-10-CM | POA: Diagnosis not present

## 2019-05-23 DIAGNOSIS — M2550 Pain in unspecified joint: Secondary | ICD-10-CM | POA: Diagnosis not present

## 2019-05-23 DIAGNOSIS — I480 Paroxysmal atrial fibrillation: Secondary | ICD-10-CM | POA: Diagnosis not present

## 2019-05-23 DIAGNOSIS — R1084 Generalized abdominal pain: Secondary | ICD-10-CM | POA: Diagnosis not present

## 2019-05-23 DIAGNOSIS — K8500 Idiopathic acute pancreatitis without necrosis or infection: Secondary | ICD-10-CM | POA: Diagnosis not present

## 2019-05-23 DIAGNOSIS — Z8719 Personal history of other diseases of the digestive system: Secondary | ICD-10-CM | POA: Diagnosis not present

## 2019-05-23 DIAGNOSIS — F0390 Unspecified dementia without behavioral disturbance: Secondary | ICD-10-CM | POA: Diagnosis present

## 2019-05-23 DIAGNOSIS — K5900 Constipation, unspecified: Secondary | ICD-10-CM | POA: Diagnosis not present

## 2019-05-23 DIAGNOSIS — K8011 Calculus of gallbladder with chronic cholecystitis with obstruction: Secondary | ICD-10-CM | POA: Diagnosis not present

## 2019-05-23 DIAGNOSIS — R609 Edema, unspecified: Secondary | ICD-10-CM | POA: Diagnosis not present

## 2019-05-23 DIAGNOSIS — Z7901 Long term (current) use of anticoagulants: Secondary | ICD-10-CM | POA: Diagnosis not present

## 2019-05-23 DIAGNOSIS — I5033 Acute on chronic diastolic (congestive) heart failure: Secondary | ICD-10-CM | POA: Diagnosis not present

## 2019-05-23 DIAGNOSIS — E874 Mixed disorder of acid-base balance: Secondary | ICD-10-CM | POA: Diagnosis not present

## 2019-05-23 DIAGNOSIS — A419 Sepsis, unspecified organism: Secondary | ICD-10-CM | POA: Diagnosis not present

## 2019-05-23 DIAGNOSIS — I4821 Permanent atrial fibrillation: Secondary | ICD-10-CM | POA: Diagnosis not present

## 2019-05-23 DIAGNOSIS — K8511 Biliary acute pancreatitis with uninfected necrosis: Secondary | ICD-10-CM | POA: Diagnosis not present

## 2019-05-23 DIAGNOSIS — R195 Other fecal abnormalities: Secondary | ICD-10-CM | POA: Diagnosis not present

## 2019-05-23 DIAGNOSIS — R899 Unspecified abnormal finding in specimens from other organs, systems and tissues: Secondary | ICD-10-CM | POA: Diagnosis not present

## 2019-05-23 DIAGNOSIS — Z7401 Bed confinement status: Secondary | ICD-10-CM | POA: Diagnosis not present

## 2019-05-23 DIAGNOSIS — E46 Unspecified protein-calorie malnutrition: Secondary | ICD-10-CM | POA: Diagnosis not present

## 2019-05-23 DIAGNOSIS — J9690 Respiratory failure, unspecified, unspecified whether with hypoxia or hypercapnia: Secondary | ICD-10-CM | POA: Diagnosis not present

## 2019-05-23 DIAGNOSIS — J690 Pneumonitis due to inhalation of food and vomit: Secondary | ICD-10-CM | POA: Diagnosis not present

## 2019-05-23 DIAGNOSIS — N183 Chronic kidney disease, stage 3 (moderate): Secondary | ICD-10-CM | POA: Diagnosis not present

## 2019-05-23 DIAGNOSIS — Z9889 Other specified postprocedural states: Secondary | ICD-10-CM | POA: Diagnosis not present

## 2019-05-23 DIAGNOSIS — R4182 Altered mental status, unspecified: Secondary | ICD-10-CM | POA: Diagnosis not present

## 2019-05-23 DIAGNOSIS — I4819 Other persistent atrial fibrillation: Secondary | ICD-10-CM | POA: Diagnosis not present

## 2019-05-23 DIAGNOSIS — R109 Unspecified abdominal pain: Secondary | ICD-10-CM | POA: Diagnosis not present

## 2019-05-23 DIAGNOSIS — E785 Hyperlipidemia, unspecified: Secondary | ICD-10-CM | POA: Diagnosis present

## 2019-05-23 DIAGNOSIS — K851 Biliary acute pancreatitis without necrosis or infection: Secondary | ICD-10-CM | POA: Diagnosis not present

## 2019-05-23 DIAGNOSIS — N179 Acute kidney failure, unspecified: Secondary | ICD-10-CM | POA: Diagnosis not present

## 2019-05-23 DIAGNOSIS — K8020 Calculus of gallbladder without cholecystitis without obstruction: Secondary | ICD-10-CM | POA: Diagnosis not present

## 2019-05-23 DIAGNOSIS — R400 Somnolence: Secondary | ICD-10-CM | POA: Diagnosis not present

## 2019-05-23 DIAGNOSIS — J80 Acute respiratory distress syndrome: Secondary | ICD-10-CM | POA: Diagnosis not present

## 2019-05-23 DIAGNOSIS — I4892 Unspecified atrial flutter: Secondary | ICD-10-CM | POA: Diagnosis present

## 2019-05-23 DIAGNOSIS — D62 Acute posthemorrhagic anemia: Secondary | ICD-10-CM | POA: Diagnosis not present

## 2019-05-23 DIAGNOSIS — I482 Chronic atrial fibrillation, unspecified: Secondary | ICD-10-CM | POA: Diagnosis not present

## 2019-05-23 HISTORY — DX: Acute respiratory failure with hypoxia: J96.01

## 2019-05-23 LAB — COMPREHENSIVE METABOLIC PANEL
ALT: 21 U/L (ref 0–44)
ALT: 22 U/L (ref 0–44)
AST: 38 U/L (ref 15–41)
AST: 31 U/L (ref 15–41)
Albumin: 4.2 g/dL (ref 3.5–5.0)
Albumin: 3.7 g/dL (ref 3.5–5.0)
Alkaline Phosphatase: 64 U/L (ref 38–126)
Alkaline Phosphatase: 42 U/L (ref 38–126)
Anion gap: 13 (ref 5–15)
Anion gap: 14 (ref 5–15)
BUN: 22 mg/dL (ref 8–23)
BUN: 18 mg/dL (ref 8–23)
CO2: 23 mmol/L (ref 22–32)
CO2: 20 mmol/L — ABNORMAL LOW (ref 22–32)
Calcium: 9.2 mg/dL (ref 8.9–10.3)
Calcium: 8.2 mg/dL — ABNORMAL LOW (ref 8.9–10.3)
Chloride: 102 mmol/L (ref 98–111)
Chloride: 104 mmol/L (ref 98–111)
Creatinine, Ser: 1.3 mg/dL — ABNORMAL HIGH (ref 0.44–1.00)
Creatinine, Ser: 1.24 mg/dL — ABNORMAL HIGH (ref 0.44–1.00)
GFR calc Af Amer: 46 mL/min — ABNORMAL LOW (ref >60)
GFR calc Af Amer: 49 mL/min — ABNORMAL LOW (ref >60)
GFR calc non Af Amer: 40 mL/min — ABNORMAL LOW (ref >60)
GFR calc non Af Amer: 42 mL/min — ABNORMAL LOW (ref >60)
Glucose, Bld: 240 mg/dL — ABNORMAL HIGH (ref 70–99)
Glucose, Bld: 236 mg/dL — ABNORMAL HIGH (ref 70–99)
Potassium: 3.3 mmol/L — ABNORMAL LOW (ref 3.5–5.1)
Potassium: 3.3 mmol/L — ABNORMAL LOW (ref 3.5–5.1)
Sodium: 138 mmol/L (ref 135–145)
Sodium: 138 mmol/L (ref 135–145)
Total Bilirubin: 0.8 mg/dL (ref 0.3–1.2)
Total Bilirubin: 1.1 mg/dL (ref 0.3–1.2)
Total Protein: 6.5 g/dL (ref 6.5–8.1)
Total Protein: 6.2 g/dL — ABNORMAL LOW (ref 6.5–8.1)

## 2019-05-23 LAB — URINALYSIS, ROUTINE W REFLEX MICROSCOPIC
Bilirubin Urine: NEGATIVE
Glucose, UA: 150 mg/dL — AB
Hgb urine dipstick: NEGATIVE
Ketones, ur: 5 mg/dL — AB
Leukocytes,Ua: NEGATIVE
Nitrite: NEGATIVE
Protein, ur: NEGATIVE mg/dL
Specific Gravity, Urine: 1.034 — ABNORMAL HIGH (ref 1.005–1.030)
pH: 5 (ref 5.0–8.0)

## 2019-05-23 LAB — GLUCOSE, CAPILLARY
Glucose-Capillary: 216 mg/dL — ABNORMAL HIGH (ref 70–99)
Glucose-Capillary: 203 mg/dL — ABNORMAL HIGH (ref 70–99)
Glucose-Capillary: 194 mg/dL — ABNORMAL HIGH (ref 70–99)
Glucose-Capillary: 180 mg/dL — ABNORMAL HIGH (ref 70–99)
Glucose-Capillary: 165 mg/dL — ABNORMAL HIGH (ref 70–99)

## 2019-05-23 LAB — CBC
HCT: 46.3 % — ABNORMAL HIGH (ref 36.0–46.0)
HCT: 49.5 % — ABNORMAL HIGH (ref 36.0–46.0)
Hemoglobin: 15.4 g/dL — ABNORMAL HIGH (ref 12.0–15.0)
Hemoglobin: 16.6 g/dL — ABNORMAL HIGH (ref 12.0–15.0)
MCH: 30.6 pg (ref 26.0–34.0)
MCH: 30 pg (ref 26.0–34.0)
MCHC: 33.3 g/dL (ref 30.0–36.0)
MCHC: 33.5 g/dL (ref 30.0–36.0)
MCV: 92 fL (ref 80.0–100.0)
MCV: 89.5 fL (ref 80.0–100.0)
Platelets: 246 10*3/uL (ref 150–400)
Platelets: 263 10*3/uL (ref 150–400)
RBC: 5.03 MIL/uL (ref 3.87–5.11)
RBC: 5.53 MIL/uL — ABNORMAL HIGH (ref 3.87–5.11)
RDW: 13 % (ref 11.5–15.5)
RDW: 12.9 % (ref 11.5–15.5)
WBC: 26.7 10*3/uL — ABNORMAL HIGH (ref 4.0–10.5)
WBC: 13.6 10*3/uL — ABNORMAL HIGH (ref 4.0–10.5)
nRBC: 0 % (ref 0.0–0.2)
nRBC: 0 % (ref 0.0–0.2)

## 2019-05-23 LAB — TROPONIN I: Troponin I: <0.03 ng/mL (ref <0.03)

## 2019-05-23 LAB — PROTIME-INR
INR: 2.1 — ABNORMAL HIGH (ref 0.8–1.2)
INR: 2.2 — ABNORMAL HIGH (ref 0.8–1.2)
Prothrombin Time: 23.1 seconds — ABNORMAL HIGH (ref 11.4–15.2)
Prothrombin Time: 24.4 seconds — ABNORMAL HIGH (ref 11.4–15.2)

## 2019-05-23 LAB — LIPASE, BLOOD: Lipase: 5920 U/L — ABNORMAL HIGH (ref 11–51)

## 2019-05-23 LAB — SARS CORONAVIRUS 2 BY RT PCR (HOSPITAL ORDER, PERFORMED IN ~~LOC~~ HOSPITAL LAB): SARS Coronavirus 2: NEGATIVE

## 2019-05-23 LAB — LACTIC ACID, PLASMA
Lactic Acid, Venous: 2.3 mmol/L (ref 0.5–1.9)
Lactic Acid, Venous: 2.8 mmol/L (ref 0.5–1.9)
Lactic Acid, Venous: 3.7 mmol/L (ref 0.5–1.9)

## 2019-05-23 MED ORDER — DILTIAZEM HCL 90 MG PO TABS
90.0000 mg | ORAL_TABLET | Freq: Two times a day (BID) | ORAL | Status: DC
  Administered 2019-05-23 – 2019-05-25 (×5): 90 mg via ORAL
  Filled 2019-05-23 (×7): qty 1

## 2019-05-23 MED ORDER — POTASSIUM CHLORIDE 10 MEQ/100ML IV SOLN
10.0000 meq | INTRAVENOUS | Status: AC
  Administered 2019-05-23 (×3): 10 meq via INTRAVENOUS
  Filled 2019-05-23 (×2): qty 100

## 2019-05-23 MED ORDER — INSULIN ASPART 100 UNIT/ML ~~LOC~~ SOLN
0.0000 [IU] | SUBCUTANEOUS | Status: DC
  Administered 2019-05-23: 3 [IU] via SUBCUTANEOUS
  Administered 2019-05-23: 2 [IU] via SUBCUTANEOUS
  Administered 2019-05-23: 3 [IU] via SUBCUTANEOUS
  Administered 2019-05-23 – 2019-05-26 (×17): 2 [IU] via SUBCUTANEOUS
  Administered 2019-05-26: 1 [IU] via SUBCUTANEOUS
  Administered 2019-05-27: 2 [IU] via SUBCUTANEOUS
  Administered 2019-05-27: 3 [IU] via SUBCUTANEOUS
  Administered 2019-05-27 (×2): 2 [IU] via SUBCUTANEOUS
  Administered 2019-05-28 (×2): 5 [IU] via SUBCUTANEOUS
  Administered 2019-05-28 (×3): 3 [IU] via SUBCUTANEOUS
  Administered 2019-05-28 – 2019-05-29 (×2): 5 [IU] via SUBCUTANEOUS
  Administered 2019-05-29: 05:00:00 7 [IU] via SUBCUTANEOUS

## 2019-05-23 MED ORDER — PIPERACILLIN-TAZOBACTAM 3.375 G IVPB 30 MIN
3.3750 g | Freq: Once | INTRAVENOUS | Status: AC
  Administered 2019-05-23: 09:00:00 3.375 g via INTRAVENOUS
  Filled 2019-05-23: qty 50

## 2019-05-23 MED ORDER — ONDANSETRON HCL 4 MG/2ML IJ SOLN
4.0000 mg | Freq: Four times a day (QID) | INTRAMUSCULAR | Status: DC | PRN
  Administered 2019-05-23 – 2019-06-12 (×18): 4 mg via INTRAVENOUS
  Filled 2019-05-23 (×18): qty 2

## 2019-05-23 MED ORDER — SODIUM CHLORIDE 0.9 % IV SOLN
INTRAVENOUS | Status: DC
  Administered 2019-05-23 – 2019-05-25 (×7): via INTRAVENOUS

## 2019-05-23 MED ORDER — POTASSIUM CHLORIDE 10 MEQ/100ML IV SOLN
10.0000 meq | INTRAVENOUS | Status: DC
  Filled 2019-05-23: qty 100

## 2019-05-23 MED ORDER — HYDROMORPHONE HCL 1 MG/ML IJ SOLN
0.5000 mg | INTRAMUSCULAR | Status: DC | PRN
  Administered 2019-05-23 – 2019-05-27 (×14): 1 mg via INTRAVENOUS
  Filled 2019-05-23 (×14): qty 1

## 2019-05-23 MED ORDER — PIPERACILLIN-TAZOBACTAM 3.375 G IVPB 30 MIN
3.3750 g | Freq: Once | INTRAVENOUS | Status: AC
  Administered 2019-05-23: 3.375 g via INTRAVENOUS
  Filled 2019-05-23: qty 50

## 2019-05-23 MED ORDER — ONDANSETRON HCL 4 MG PO TABS
4.0000 mg | ORAL_TABLET | Freq: Four times a day (QID) | ORAL | Status: DC | PRN
  Administered 2019-06-13: 4 mg via ORAL
  Filled 2019-05-23: qty 1

## 2019-05-23 MED ORDER — GADOBUTROL 1 MMOL/ML IV SOLN
9.0000 mL | Freq: Once | INTRAVENOUS | Status: AC | PRN
  Administered 2019-05-23: 9 mL via INTRAVENOUS

## 2019-05-23 MED ORDER — ACETAMINOPHEN 650 MG RE SUPP: 650.0000 mg | Freq: Four times a day (QID) | RECTAL | Status: DC | PRN

## 2019-05-23 MED ORDER — IOHEXOL 300 MG/ML  SOLN
80.0000 mL | Freq: Once | INTRAMUSCULAR | Status: AC | PRN
  Administered 2019-05-23: 80 mL via INTRAVENOUS

## 2019-05-23 MED ORDER — PIPERACILLIN-TAZOBACTAM 3.375 G IVPB
3.3750 g | Freq: Three times a day (TID) | INTRAVENOUS | Status: DC
  Administered 2019-05-24 – 2019-05-27 (×11): 3.375 g via INTRAVENOUS
  Filled 2019-05-23 (×10): qty 50

## 2019-05-23 MED ORDER — ACETAMINOPHEN 325 MG PO TABS
650.0000 mg | ORAL_TABLET | Freq: Four times a day (QID) | ORAL | Status: DC | PRN
  Administered 2019-05-25 – 2019-06-03 (×3): 650 mg via ORAL
  Filled 2019-05-23 (×4): qty 2

## 2019-05-23 MED ORDER — FENTANYL CITRATE (PF) 100 MCG/2ML IJ SOLN
100.0000 ug | Freq: Once | INTRAMUSCULAR | Status: AC
  Administered 2019-05-23: 100 ug via INTRAVENOUS
  Filled 2019-05-23: qty 2

## 2019-05-23 MED ORDER — METOPROLOL TARTRATE 25 MG PO TABS
37.5000 mg | ORAL_TABLET | Freq: Two times a day (BID) | ORAL | Status: DC
  Administered 2019-05-23 – 2019-05-25 (×5): 37.5 mg via ORAL
  Filled 2019-05-23 (×5): qty 2

## 2019-05-23 NOTE — Progress Notes (Addendum)
See H&P from this morning.  General: 78 y.o. female resting in bed in NAD Cardiovascular: tachy, +S1, S2, no m/g/r, equal pulses throughout Respiratory: CTABL, no w/r/r, normal WOB GI: BS+, epigastric TTP, no masses noted, no organomegaly noted MSK: No e/c/c Skin: No rashes, bruises, ulcerations noted Neuro: A&O x 3, no focal deficits   Acute pancreatitis     - IVF: 2L bolus in ED and NS at 125 cc/hr     - Continue zosyn     - NPO except meds     - zofran PRN nausea     - RUQ [place] w/o evidence of acute chole, however, CBD is 37mm; spoke GI, order MRCP if stone detected, call them back     - Dilaudid PRN pain  A.Fib - (A.Flutter at the moment actually)     - Hold coumadin, convert to heparin gtt when INR subtheraputic     - Continue metoprolol and cardizem PO  DM2     - Hold metformin     - Sensitive SSI Q4H  HTN     - hypotensive at presentation     - Hold losartan/HCTZ  I have reviewed MRCP plan with pt. She is in agreement.   Jonnie Finner

## 2019-05-23 NOTE — H&P (Signed)
History and Physical    Melanie [name] CWC:[id number] DOB: 05-Mar-1941 DOA: 05/22/2019  PCP: [name], MD  Patient coming from: Home  I have personally briefly reviewed patient's old medical records in [facility]  Chief Complaint: Abd pain  HPI: Melanie [name] is a 78 y.o. female with medical history significant of DM2, HTN.  Patient presents to the ED with c/o epigastric abd pain, N/V.  Symptoms onset suddenly 3 hours ago, symptoms are severe, nothing makes better or worse, pain located in epigastrium.   ED Course: COVID negative.  Lipase is over 5000, AST, ALT are nl, WBC 26k.  BP initially 76H systolic, improves to 607P-710G systolic after 2L NS bolus.  CT abd/pelvis reveals acute pancreatitis, no necrosis or pseudocyst.  Also shows cholelithiasis.   Review of Systems: As per HPI otherwise 10 point review of systems negative.   Past Medical History:  Diagnosis Date   Allergic rhinitis    Arthritis    Diabetes mellitus    GERD (gastroesophageal reflux disease)    Hyperlipidemia    Hypertension    Pain in joint, ankle and foot 09/14/2013    Past Surgical History:  Procedure Laterality Date   COLONOSCOPY     03/2018, removed polyps   FRACTURE SURGERY Left     leg   hiatal hernia surgery  2010   ROTATOR CUFF REPAIR Right 2010   TONSILLECTOMY       reports that she has never smoked. She has never used smokeless tobacco. She reports that she does not drink alcohol or use drugs.  Allergies  Allergen Reactions   Niacin And Related Rash   Keflex [Cephalexin] Other (See Comments)    Fatigue, sore throat, body aches   Sausage [Pickled Meat] Other (See Comments)    Drowsiness and dizziness   Tape Rash    Bandaids     Family History  Problem Relation Age of Onset   Colon cancer Brother 34   Colon cancer Maternal Aunt 50   Breast cancer Paternal Grandmother 25   Breast cancer Maternal Aunt 28   Diabetes Maternal Aunt     Heart disease Mother    Stomach cancer Neg Hx      Prior to Admission medications   Medication Sig Start Date End Date Taking? Authorizing Provider  Ascorbic Acid (VITAMIN C) 1000 MG tablet Take 1,000 mg by mouth 2 (two) times daily.    [provider]  Calcium Carb-Cholecalciferol (CALTRATE 600+D3 SOFT PO) Take 1 tablet by mouth 2 (two) times daily.    [provider]  CRANBERRY PO Take 2 tablets by mouth daily.    [provider]  Cyanocobalamin (VITAMIN B-12) 1000 MCG SUBL Place 1 each under the tongue daily.    [provider]  diltiazem (CARDIZEM) 90 MG tablet TAKE 1 TABLET (90 MG TOTAL) BY MOUTH 2 (TWO) TIMES DAILY. 04/07/19   [name], MD  fish oil-omega-3 fatty acids 1000 MG capsule Take 2 g by mouth daily.    [provider]  fluticasone (FLONASE) 50 MCG/ACT nasal spray Place 2 sprays into both nostrils daily as needed for allergies or rhinitis.    [provider]  furosemide (LASIX) 20 MG tablet TAKE 1 TABLET BY MOUTH EVERY DAY 02/01/19   [name], MD  hydrocortisone 2.5 % cream APPLY TO THE FACE TWICE A DAY AS NEEDED 1 WEEK ON THEN 1 WEEK OFF 04/12/18   [provider]  lisinopril-hydrochlorothiazide Reita May)  20-12.5 MG tablet TAKE 1 TABLET BY MOUTH EVERY DAY 02/01/19   [name], MD  loperamide (IMODIUM A-D) 2 MG capsule Take by mouth as needed for diarrhea or loose stools.    [provider]  metFORMIN (GLUCOPHAGE) 500 MG tablet TAKE 1 TABLET (500 MG TOTAL) BY MOUTH 2 (TWO) TIMES DAILY WITH A MEAL. 12/27/18   [name], MD  metoprolol tartrate (LOPRESSOR) 25 MG tablet Take 1.5 tablets (37.5 mg total) by mouth 2 (two) times daily. 02/22/19   Sherran Needs, NP  omeprazole (PRILOSEC) 20 MG capsule TAKE 1 CAPSULE BY MOUTH EVERY DAY 03/21/19   [name], MD  pravastatin (PRAVACHOL) 40 MG tablet Take 1 tablet (40 mg total) by mouth daily. 02/24/19   [name], MD  Probiotic Product (PROBIOTIC PO) Take 1 capsule by mouth daily.     [provider]  sulfamethoxazole-trimethoprim (BACTRIM DS) 800-160 MG tablet Take 1 tablet by mouth 2 (two) times daily. 04/02/19   Hassell Done Mary-Margaret, FNP  vitamin E 100 UNIT capsule Take by mouth.    [provider]  warfarin (COUMADIN) 5 MG tablet TAKE AS DIRECTED BY COUMADIN CLINIC 03/25/19   Sherren Mocha, MD    Physical Exam: Vitals:   05/22/19 2315 05/22/19 2330 05/22/19 2345 05/23/19 0024  BP: 127/77 124/68 91/69 (!) 143/86  Pulse: (!) 58 71 75   Resp:   (!) 29 (!) 27  Temp:      SpO2: 96% 97% (!) 89%   Weight:      Height:        Constitutional: Ill appearing, moaning in pain Eyes: PERRL, lids and conjunctivae normal ENMT: Mucous membranes are moist. Posterior pharynx clear of any exudate or lesions.Normal dentition.  Neck: normal, supple, no masses, no thyromegaly Respiratory: clear to auscultation bilaterally, no wheezing, no crackles. Normal respiratory effort. No accessory muscle use.  Cardiovascular: irregularly irregular  Abdomen: Epigastric TTP Musculoskeletal: no clubbing / cyanosis. No joint deformity upper and lower extremities. Good ROM, no contractures. Normal muscle tone.  Skin: no rashes, lesions, ulcers. No induration Neurologic: CN 2-12 grossly intact. Sensation intact, DTR normal. Strength 5/5 in all 4.  Psychiatric: Normal judgment and insight. Alert and oriented x 3. Normal mood.    Labs on Admission: I have personally reviewed following labs and imaging studies  CBC: Recent Labs  Lab 05/22/19 2323 05/22/19 2338  WBC 26.7*  --   HGB 15.4* 16.0*  HCT 46.3* 47.0*  MCV 92.0  --   PLT 246  --    Basic Metabolic Panel: Recent Labs  Lab 05/22/19 2323 05/22/19 2338  NA 138 137  K 3.3* 3.2*  CL 102  --   CO2 23  --   GLUCOSE 240*  --   BUN 22  --   CREATININE 1.30* 1.20*  CALCIUM 9.2  --    GFR: Estimated Creatinine Clearance: 45.1  mL/min (A) (by C-G formula based on SCr of 1.2 mg/dL (H)). Liver Function Tests: Recent Labs  Lab 05/22/19 2323  AST 38  ALT 21  ALKPHOS 64  BILITOT 0.8  PROT 6.5  ALBUMIN 4.2   Recent Labs  Lab 05/22/19 2323  LIPASE 5,920*   No results for input(s): AMMONIA in the last 168 hours. Coagulation Profile: Recent Labs  Lab 05/22/19 2323  INR 2.1*   Cardiac Enzymes: Recent Labs  Lab 05/22/19 2323  TROPONINI <0.03   BNP (last 3 results) No results for input(s): PROBNP in  the last 8760 hours. HbA1C: No results for input(s): HGBA1C in the last 72 hours. CBG: No results for input(s): GLUCAP in the last 168 hours. Lipid Profile: No results for input(s): CHOL, HDL, LDLCALC, TRIG, CHOLHDL, LDLDIRECT in the last 72 hours. Thyroid Function Tests: No results for input(s): TSH, T4TOTAL, FREET4, T3FREE, THYROIDAB in the last 72 hours. Anemia Panel: No results for input(s): VITAMINB12, FOLATE, FERRITIN, TIBC, IRON, RETICCTPCT in the last 72 hours. Urine analysis:    Component Value Date/Time   COLORURINE YELLOW 02/13/2009 2153   APPEARANCEUR CLOUDY (A) 02/13/2009 2153   LABSPEC 1.025 11/30/2016 1856   PHURINE 6.5 11/30/2016 1856   GLUCOSEU NEGATIVE 11/30/2016 1856   HGBUR LARGE (A) 11/30/2016 1856   BILIRUBINUR negative 11/20/2017 1447   KETONESUR 15 (A) 11/30/2016 1856   PROTEINUR negative 11/20/2017 1447   PROTEINUR >=300 (A) 11/30/2016 1856   UROBILINOGEN 0.2 11/20/2017 1447   UROBILINOGEN 1.0 11/30/2016 1856   NITRITE negative 11/20/2017 1447   NITRITE NEGATIVE 11/30/2016 1856   LEUKOCYTESUR Trace (A) 11/20/2017 1447    Radiological Exams on Admission: Ct Abdomen Pelvis W Contrast  Result Date: 05/23/2019 CLINICAL DATA:  78 year old female with abdominal pain, nausea vomiting. History of GERD and diabetes and hiatal hernia. EXAM: CT ABDOMEN AND PELVIS WITH CONTRAST TECHNIQUE: Multidetector CT imaging of the abdomen and pelvis was performed using the standard protocol  following bolus administration of intravenous contrast. CONTRAST:  18mL OMNIPAQUE IOHEXOL 300 MG/ML  SOLN COMPARISON:  None. FINDINGS: Lower chest: Minimal bibasilar dependent atelectatic changes. There is coronary vascular calcification involving the left circumflex artery. No intra-abdominal free air. Small perihepatic ascites. Hepatobiliary: The liver is unremarkable. There is mild periportal edema. The gallbladder is distended. Small amount of layering stone noted within the gallbladder. No calcified stone identified in the central CBD. Pancreas: There is inflammatory changes of the pancreas with peripancreatic edema most consistent with acute pancreatitis. No abscess or pseudocyst. Spleen: Normal in size without focal abnormality. Adrenals/Urinary Tract: The adrenal glands are unremarkable. There is a 3 cm right renal upper pole cyst as well as additional subcentimeter renal hypodensities which are too small to characterize. There is no hydronephrosis on either side. There is symmetric enhancement and excretion of contrast by both kidneys. The visualized ureters and urinary bladder appear unremarkable. Stomach/Bowel: There is a large hiatal hernia. There is a 2.7 cm diverticulum arising from the third portion of the duodenum. There is colonic diverticulosis without active inflammatory changes. There is no bowel obstruction or active inflammation. The appendix is normal. Vascular/Lymphatic: Moderate aortoiliac atherosclerotic disease. The IVC is unremarkable. No portal venous gas. There is no adenopathy. Reproductive: The uterus is grossly unremarkable. No pelvic mass. Other: None Musculoskeletal: Degenerative changes of the spine. No acute osseous pathology. IMPRESSION: 1. Acute pancreatitis. No abscess or pseudocyst. 2. Colonic diverticulosis. No bowel obstruction or active inflammation. Normal appendix. 3. Cholelithiasis. 4. Large hiatal hernia. Electronically Signed   By: Anner Crete M.D.   On:  05/23/2019 00:38   Dg Chest Port 1 View  Result Date: 05/22/2019 CLINICAL DATA:  Abdominal pain, vomiting EXAM: PORTABLE CHEST 1 VIEW COMPARISON:  12/29/2016 FINDINGS: Cardiomegaly. Moderate-sized hiatal hernia. No confluent opacities, effusions or edema. No acute bony abnormality. IMPRESSION: Cardiomegaly.  Moderate-sized hiatal hernia.  No active disease. Electronically Signed   By: Rolm Baptise M.D.   On: 05/22/2019 23:37    EKG: Independently reviewed.  Assessment/Plan Principal Problem:   Acute pancreatitis Active Problems:   HTN (hypertension)   DM (  diabetes mellitus) type II controlled, neurological manifestation ([place])   Afib ([place])    1. Acute pancreatitis - 1. IVF: 2L bolus in ED and NS at 125 cc/hr 2. Continue zosyn 3. NPO except meds 4. zofran PRN nausea 5. Repeat CBC/CMP tomorrow AM 6. Getting [place] right now to see if any further evidence of cholecystitis or duct obstruction 1. May want GI consult in AM 7. Dilaudid PRN pain 8. Tele monitor and cont pulse ox 9. Will put in SDU for the moment 2. A.Fib - (A.Flutter at the moment actually) 1. Hold coumadin, convert to heparin gtt when INR subtheraputic 2. Continue metoprolol and cardizem PO 3. If unable to take PO meds in AM then will need to use cardizem gtt / IV metoprolol 3. DM2 - 1. Hold metformin 2. Sensitive SSI Q4H 4. HTN - 1. Hold losartan/HCTZ  DVT prophylaxis: Heparin gtt after coumadin no longer theraputic Code Status: Full Family Communication: No family in room Disposition Plan: Home after admit Consults called: None Admission status: Admit to inpatient  Severity of Illness: The appropriate patient status for this patient is INPATIENT. Inpatient status is judged to be reasonable and necessary in order to provide the required intensity of service to ensure the patient's safety. The patient's presenting symptoms, physical exam findings, and initial radiographic and laboratory data in the context of their  chronic comorbidities is felt to place them at high risk for further clinical deterioration. Furthermore, it is not anticipated that the patient will be medically stable for discharge from the hospital within 2 midnights of admission. The following factors support the patient status of inpatient.   IP status for acute pancreatitis with elevated WBC, hypotension, and severe abd pain.   * I certify that at the point of admission it is my clinical judgment that the patient will require inpatient hospital care spanning beyond 2 midnights from the point of admission due to high intensity of service, high risk for further deterioration and high frequency of surveillance required.*    Jerris Keltz M. DO [facility]  How to contact the [facility] Attending or Consulting provider [place] or covering provider during after hours [place], for this patient?  1. Check the care team in [facility] and look for a) attending/consulting [facility] provider listed and b) the [facility] team listed 2. Log into [email or web address]  Amion Physician Scheduling and messaging for groups and whole hospitals  On call and physician scheduling software for group practices, residents, hospitalists and other medical providers for call, clinic, rotation and shift schedules. On[employer] is a hospital-wide system for scheduling doctors and paging doctors on call. EasyPlot is for scientific plotting and data analysis.  [email or web address]  and use [place]'s universal password to access. If you do not have the password, please contact the hospital operator.  3. Locate the [facility] provider you are looking for under [facility] and page to a number that you can be directly reached. 4. If you still have difficulty reaching the provider, please page the [facility] (Director on Call) for the Hospitalists listed on amion for assistance.  05/23/2019, 1:53 AM

## 2019-05-23 NOTE — ED Notes (Signed)
To ultrasound

## 2019-05-23 NOTE — ED Notes (Signed)
Sons number at the house  2 sons live with her  [phone]

## 2019-05-23 NOTE — Plan of Care (Signed)
  Problem: Education: Goal: Knowledge of General Education information will improve Description Including pain rating scale, medication(s)/side effects and non-pharmacologic comfort measures Outcome: Progressing   

## 2019-05-23 NOTE — Progress Notes (Signed)
ANTICOAGULATION CONSULT NOTE - Initial Consult  Pharmacy Consult for heparin Indication: atrial fibrillation  Allergies  Allergen Reactions  . Niacin And Related Rash  . Keflex [Cephalexin] Other (See Comments)    Fatigue, sore throat, body aches  . Sausage [Pickled Meat] Other (See Comments)    Drowsiness and dizziness  . Tape Rash    Bandaids     Patient Measurements: Height: 5\' 4"  (162.6 cm) Weight: 204 lb 12.9 oz (92.9 kg) IBW/kg (Calculated) : 54.7 Heparin Dosing Weight: 75kg  Vital Signs: Temp: 97.7 F (36.5 C) (06/01 0355) Temp Source: Oral (06/01 0355) BP: 141/87 (06/01 0355) Pulse Rate: 87 (06/01 0355)  Labs: Recent Labs    05/22/19 2323 05/22/19 2338 05/23/19 0441  HGB 15.4* 16.0* 16.6*  HCT 46.3* 47.0* 49.5*  PLT 246  --  263  LABPROT 23.1*  --  24.4*  INR 2.1*  --  2.2*  CREATININE 1.30* 1.20*  --   TROPONINI <0.03  --   --     Estimated Creatinine Clearance: 43.4 mL/min (A) (by C-G formula based on SCr of 1.2 mg/dL (H)).   Medical History: Past Medical History:  Diagnosis Date  . Allergic rhinitis   . Arthritis   . Diabetes mellitus   . GERD (gastroesophageal reflux disease)   . Hyperlipidemia   . Hypertension   . Pain in joint, ankle and foot 09/14/2013    Medications:  Medications Prior to Admission  Medication Sig Dispense Refill Last Dose  . Ascorbic Acid (VITAMIN C) 1000 MG tablet Take 1,000 mg by mouth 2 (two) times daily.   Taking  . Calcium Carb-Cholecalciferol (CALTRATE 600+D3 SOFT PO) Take 1 tablet by mouth 2 (two) times daily.   Taking  . CRANBERRY PO Take 2 tablets by mouth daily.   Taking  . Cyanocobalamin (VITAMIN B-12) 1000 MCG SUBL Place 1 each under the tongue daily.   Taking  . diltiazem (CARDIZEM) 90 MG tablet TAKE 1 TABLET (90 MG TOTAL) BY MOUTH 2 (TWO) TIMES DAILY. 180 tablet 1   . fish oil-omega-3 fatty acids 1000 MG capsule Take 2 g by mouth daily.   Taking  . fluticasone (FLONASE) 50 MCG/ACT nasal spray Place 2  sprays into both nostrils daily as needed for allergies or rhinitis.   Taking  . furosemide (LASIX) 20 MG tablet TAKE 1 TABLET BY MOUTH EVERY DAY 90 tablet 1 Taking  . hydrocortisone 2.5 % cream APPLY TO THE FACE TWICE A DAY AS NEEDED 1 WEEK ON THEN 1 WEEK OFF  0 Taking  . lisinopril-hydrochlorothiazide (PRINZIDE,ZESTORETIC) 20-12.5 MG tablet TAKE 1 TABLET BY MOUTH EVERY DAY 90 tablet 1 Taking  . loperamide (IMODIUM A-D) 2 MG capsule Take by mouth as needed for diarrhea or loose stools.   Taking  . metFORMIN (GLUCOPHAGE) 500 MG tablet TAKE 1 TABLET (500 MG TOTAL) BY MOUTH 2 (TWO) TIMES DAILY WITH A MEAL. 180 tablet 1 Taking  . metoprolol tartrate (LOPRESSOR) 25 MG tablet Take 1.5 tablets (37.5 mg total) by mouth 2 (two) times daily. 180 tablet 1 Taking  . omeprazole (PRILOSEC) 20 MG capsule TAKE 1 CAPSULE BY MOUTH EVERY DAY 90 capsule 1   . pravastatin (PRAVACHOL) 40 MG tablet Take 1 tablet (40 mg total) by mouth daily. 90 tablet 3 Taking  . Probiotic Product (PROBIOTIC PO) Take 1 capsule by mouth daily.    Taking  . sulfamethoxazole-trimethoprim (BACTRIM DS) 800-160 MG tablet Take 1 tablet by mouth 2 (two) times daily. 10 tablet 0   .  vitamin E 100 UNIT capsule Take by mouth.   Taking  . warfarin (COUMADIN) 5 MG tablet TAKE AS DIRECTED BY COUMADIN CLINIC 120 tablet 0    Scheduled:  . diltiazem  90 mg Oral BID  . insulin aspart  0-9 Units Subcutaneous Q4H  . metoprolol tartrate  37.5 mg Oral BID  . sodium chloride flush  3 mL Intravenous Once   Infusions:  . sodium chloride 125 mL/hr at 05/23/19 0414  . piperacillin-tazobactam    . potassium chloride 10 mEq (05/23/19 0526)  . sodium chloride      Assessment: 78yo female c/o N/V, admitted for acute pancreatitis, to transition from Coumadin to heparin while NPO; current INR 2.2 (trending up currently).  Goal of Therapy:  Heparin level 0.3-0.7 units/ml Monitor platelets by anticoagulation protocol: Yes   Plan:  Will monitor INR and  begin heparin when INR <2.  Wynona Neat, PharmD, BCPS  05/23/2019,5:37 AM

## 2019-05-23 NOTE — ED Notes (Signed)
Pt returned from c-t c/o pain still and vomiting  Nasal 02 at 2 still in place low sats from pain med

## 2019-05-23 NOTE — Progress Notes (Signed)
Pharmacy Antibiotic Note  Melanie [name] is a 78 y.o. female admitted on 05/22/2019 with intra-abdominal infection.  Pharmacy has been consulted for Zosyn dosing.  Plan: Zosyn 3.375g IV q8h (4-hour infusion).  Height: 5\' 4"  (162.6 cm) Weight: 220 lb (99.8 kg) IBW/kg (Calculated) : 54.7  Temp (24hrs), Avg:97.9 F (36.6 C), Min:97.9 F (36.6 C), Max:97.9 F (36.6 C)  Recent Labs  Lab 05/22/19 2323 05/22/19 2338  WBC 26.7*  --   CREATININE 1.30* 1.20*  LATICACIDVEN 2.3*  --     Estimated Creatinine Clearance: 45.1 mL/min (A) (by C-G formula based on SCr of 1.2 mg/dL (H)).    Allergies  Allergen Reactions  . Niacin And Related Rash  . Keflex [Cephalexin] Other (See Comments)    Fatigue, sore throat, body aches  . Sausage [Pickled Meat] Other (See Comments)    Drowsiness and dizziness  . Tape Rash    Bandaids     Thank you for allowing pharmacy to be a part of this patient's care.  Wynona Neat, PharmD, BCPS  05/23/2019 1:42 AM

## 2019-05-23 NOTE — ED Notes (Signed)
Report called to rn on 4e 

## 2019-05-23 NOTE — Progress Notes (Signed)
Pt with sustaining and nonsustained Afib RVR. Alcario Drought MD made aware. Verbal orders to continue NS @ 125 and give 1000 am dose of metoprolol and cardizem now. If patient unable to keep down PO form cardizem gtt to be started. Will continue to monitor.

## 2019-05-23 NOTE — Progress Notes (Signed)
Notified by lab critical lactic acid 2.8. Alcario Drought, MD notified. Awaiting orders. Will continue to monitor.

## 2019-05-23 NOTE — Progress Notes (Signed)
Pt arrived from ED to 4E01. Initial assessment, vitals, and CHG complete. Tele applied. CCMD notified. Pt oriented to room and call bell system. Will continue to monitor.

## 2019-05-24 LAB — LACTIC ACID, PLASMA: Lactic Acid, Venous: 2.8 mmol/L (ref 0.5–1.9)

## 2019-05-24 LAB — CBC WITH DIFFERENTIAL/PLATELET
Abs Immature Granulocytes: 0 10*3/uL (ref 0.00–0.07)
Basophils Absolute: 0.2 10*3/uL — ABNORMAL HIGH (ref 0.0–0.1)
Basophils Relative: 1 %
Eosinophils Absolute: 0 10*3/uL (ref 0.0–0.5)
Eosinophils Relative: 0 %
HCT: 48 % — ABNORMAL HIGH (ref 36.0–46.0)
Hemoglobin: 16.2 g/dL — ABNORMAL HIGH (ref 12.0–15.0)
Lymphocytes Relative: 9 %
Lymphs Abs: 1.7 10*3/uL (ref 0.7–4.0)
MCH: 30.1 pg (ref 26.0–34.0)
MCHC: 33.8 g/dL (ref 30.0–36.0)
MCV: 89.2 fL (ref 80.0–100.0)
Monocytes Absolute: 1.2 10*3/uL — ABNORMAL HIGH (ref 0.1–1.0)
Monocytes Relative: 6 %
Neutro Abs: 16.1 10*3/uL — ABNORMAL HIGH (ref 1.7–7.7)
Neutrophils Relative %: 84 %
Platelets: 220 10*3/uL (ref 150–400)
RBC: 5.38 MIL/uL — ABNORMAL HIGH (ref 3.87–5.11)
RDW: 13.8 % (ref 11.5–15.5)
WBC: 19.2 10*3/uL — ABNORMAL HIGH (ref 4.0–10.5)
nRBC: 0 % (ref 0.0–0.2)
nRBC: 0 /100 WBC

## 2019-05-24 LAB — COMPREHENSIVE METABOLIC PANEL
ALT: 45 U/L — ABNORMAL HIGH (ref 0–44)
AST: 45 U/L — ABNORMAL HIGH (ref 15–41)
Albumin: 2.9 g/dL — ABNORMAL LOW (ref 3.5–5.0)
Alkaline Phosphatase: 38 U/L (ref 38–126)
Anion gap: 12 (ref 5–15)
BUN: 31 mg/dL — ABNORMAL HIGH (ref 8–23)
CO2: 22 mmol/L (ref 22–32)
Calcium: 8.4 mg/dL — ABNORMAL LOW (ref 8.9–10.3)
Chloride: 106 mmol/L (ref 98–111)
Creatinine, Ser: 1.66 mg/dL — ABNORMAL HIGH (ref 0.44–1.00)
GFR calc Af Amer: 34 mL/min — ABNORMAL LOW (ref >60)
GFR calc non Af Amer: 29 mL/min — ABNORMAL LOW (ref >60)
Glucose, Bld: 191 mg/dL — ABNORMAL HIGH (ref 70–99)
Potassium: 4.3 mmol/L (ref 3.5–5.1)
Sodium: 140 mmol/L (ref 135–145)
Total Bilirubin: 1.4 mg/dL — ABNORMAL HIGH (ref 0.3–1.2)
Total Protein: 5.6 g/dL — ABNORMAL LOW (ref 6.5–8.1)

## 2019-05-24 LAB — GLUCOSE, CAPILLARY
Glucose-Capillary: 153 mg/dL — ABNORMAL HIGH (ref 70–99)
Glucose-Capillary: 155 mg/dL — ABNORMAL HIGH (ref 70–99)
Glucose-Capillary: 167 mg/dL — ABNORMAL HIGH (ref 70–99)
Glucose-Capillary: 168 mg/dL — ABNORMAL HIGH (ref 70–99)
Glucose-Capillary: 176 mg/dL — ABNORMAL HIGH (ref 70–99)
Glucose-Capillary: 191 mg/dL — ABNORMAL HIGH (ref 70–99)

## 2019-05-24 LAB — PROTIME-INR
INR: 4.1 (ref 0.8–1.2)
Prothrombin Time: 39.3 seconds — ABNORMAL HIGH (ref 11.4–15.2)

## 2019-05-24 LAB — LIPASE, BLOOD: Lipase: 373 U/L — ABNORMAL HIGH (ref 11–51)

## 2019-05-24 MED ORDER — SODIUM CHLORIDE 0.9% FLUSH: 10.0000 mL | INTRAVENOUS | Status: DC | PRN

## 2019-05-24 MED ORDER — SODIUM CHLORIDE 0.9% FLUSH
10.0000 mL | Freq: Two times a day (BID) | INTRAVENOUS | Status: DC
  Administered 2019-05-26 – 2019-05-28 (×2): 10 mL
  Administered 2019-05-28 – 2019-05-29 (×2): 20 mL
  Administered 2019-05-29: 10:00:00 10 mL
  Administered 2019-05-30: 10:00:00 30 mL
  Administered 2019-05-31: 40 mL
  Administered 2019-06-01 – 2019-06-03 (×4): 10 mL
  Administered 2019-06-04: 30 mL
  Administered 2019-06-04 – 2019-06-05 (×2): 10 mL
  Administered 2019-06-07: 20 mL
  Administered 2019-06-08 – 2019-06-13 (×5): 10 mL

## 2019-05-24 NOTE — Progress Notes (Signed)
ANTICOAGULATION CONSULT NOTE - -follow up  Pharmacy Consult for heparin Indication: atrial fibrillation  Allergies  Allergen Reactions  . Niacin And Related Rash  . Keflex [Cephalexin] Other (See Comments)    Fatigue, sore throat, body aches  . Sausage [Pickled Meat] Other (See Comments)    Drowsiness and dizziness  . Tape Rash    Bandaids     Patient Measurements: Height: 5\' 4"  (162.6 cm) Weight: 204 lb 12.9 oz (92.9 kg) IBW/kg (Calculated) : 54.7 Heparin Dosing Weight: 75kg  Vital Signs: Temp: 100 F (37.8 C) (06/02 0350) Temp Source: Axillary (06/02 0350) BP: 132/87 (06/02 0815) Pulse Rate: 118 (06/02 0815)  Labs: Recent Labs    05/22/19 2323 05/22/19 2338 05/23/19 0441 05/24/19 0422 05/24/19 0634  HGB 15.4* 16.0* 16.6*  --  16.2*  HCT 46.3* 47.0* 49.5*  --  48.0*  PLT 246  --  263  --  220  LABPROT 23.1*  --  24.4* 39.3*  --   INR 2.1*  --  2.2* 4.1*  --   CREATININE 1.30* 1.20* 1.24*  --  1.66*  TROPONINI <0.03  --   --   --   --     Estimated Creatinine Clearance: 31.4 mL/min (A) (by C-G formula based on SCr of 1.66 mg/dL (H)).   Medical History: Past Medical History:  Diagnosis Date  . Allergic rhinitis   . Arthritis   . Diabetes mellitus   . GERD (gastroesophageal reflux disease)   . Hyperlipidemia   . Hypertension   . Pain in joint, ankle and foot 09/14/2013    Medications:  Medications Prior to Admission  Medication Sig Dispense Refill Last Dose  . Ascorbic Acid (VITAMIN C) 1000 MG tablet Take 1,000 mg by mouth 2 (two) times daily.   Taking  . Calcium Carb-Cholecalciferol (CALTRATE 600+D3 SOFT PO) Take 1 tablet by mouth 2 (two) times daily.   Taking  . CRANBERRY PO Take 2 tablets by mouth daily.   Taking  . Cyanocobalamin (VITAMIN B-12) 1000 MCG SUBL Place 1 each under the tongue daily.   Taking  . diltiazem (CARDIZEM) 90 MG tablet TAKE 1 TABLET (90 MG TOTAL) BY MOUTH 2 (TWO) TIMES DAILY. 180 tablet 1   . fish oil-omega-3 fatty acids 1000  MG capsule Take 2 g by mouth daily.   Taking  . fluticasone (FLONASE) 50 MCG/ACT nasal spray Place 2 sprays into both nostrils daily as needed for allergies or rhinitis.   Taking  . furosemide (LASIX) 20 MG tablet TAKE 1 TABLET BY MOUTH EVERY DAY (Patient taking differently: Take 20 mg by mouth daily. ) 90 tablet 1 Taking  . hydrocortisone 2.5 % cream APPLY TO THE FACE TWICE A DAY AS NEEDED 1 WEEK ON THEN 1 WEEK OFF  0 Taking  . lisinopril-hydrochlorothiazide (PRINZIDE,ZESTORETIC) 20-12.5 MG tablet TAKE 1 TABLET BY MOUTH EVERY DAY (Patient taking differently: Take 1 tablet by mouth daily. ) 90 tablet 1 Taking  . loperamide (IMODIUM A-D) 2 MG capsule Take by mouth as needed for diarrhea or loose stools.   Taking  . metFORMIN (GLUCOPHAGE) 500 MG tablet TAKE 1 TABLET (500 MG TOTAL) BY MOUTH 2 (TWO) TIMES DAILY WITH A MEAL. 180 tablet 1 Taking  . metoprolol tartrate (LOPRESSOR) 25 MG tablet Take 1.5 tablets (37.5 mg total) by mouth 2 (two) times daily. (Patient taking differently: Take 25 mg by mouth 2 (two) times daily. ) 180 tablet 1 Taking  . omeprazole (PRILOSEC) 20 MG capsule TAKE 1 CAPSULE  BY MOUTH EVERY DAY (Patient taking differently: Take 20 mg by mouth daily. ) 90 capsule 1   . pravastatin (PRAVACHOL) 40 MG tablet Take 1 tablet (40 mg total) by mouth daily. 90 tablet 3 Taking  . Probiotic Product (PROBIOTIC PO) Take 1 capsule by mouth daily.    Taking  . sulfamethoxazole-trimethoprim (BACTRIM DS) 800-160 MG tablet Take 1 tablet by mouth 2 (two) times daily. 10 tablet 0   . vitamin E 100 UNIT capsule Take by mouth.   Taking  . warfarin (COUMADIN) 5 MG tablet TAKE AS DIRECTED BY COUMADIN CLINIC (Patient taking differently: Take 5-7.5 mg by mouth See admin instructions. Take 1 1/2 tablet (7.5 mg) by mouth on Monday, Wednesday, Friday, take 1 tablet (5 mg) on Sunday, Tuesday, Thursday, Saturday - or as directed by Coumadin Clinic) 120 tablet 0    Scheduled:  . diltiazem  90 mg Oral BID  . insulin  aspart  0-9 Units Subcutaneous Q4H  . metoprolol tartrate  37.5 mg Oral BID  . sodium chloride flush  3 mL Intravenous Once   Infusions:  . sodium chloride 125 mL/hr at 05/24/19 0552  . piperacillin-tazobactam (ZOSYN)  IV 3.375 g (05/24/19 0606)  . sodium chloride      Assessment: 78yo female c/o N/V, admitted for acute pancreatitis, to transition from Coumadin to heparin while NPO; current INR 4.1 (trending up currently). Scr increased to 1.6.  AST/ALT increased to 45/45.  H/H wnl/stable.  No need for IV heparin at this time.   Goal of Therapy:  Heparin level 0.3-0.7 units/ml Monitor platelets by anticoagulation protocol: Yes   Plan:  Will monitor INR daily and begin heparin when INR <2.  Nicole Cella, RPh Clinical Pharmacist Pager: [phone] Please check AMION for all [place] phone numbers After 10:00 PM, call [place] [phone] 05/24/2019,9:59 AM

## 2019-05-24 NOTE — Progress Notes (Signed)
[name]  PROGRESS NOTE    Melanie [name]  OQH:[id number] DOB: 1941/01/29 DOA: 05/22/2019 PCP: [name], MD   Brief Narrative:   Melanie [name] is a 78 y.o. female with medical history significant of DM2, HTN.  Patient presents to the ED with c/o epigastric abd pain, N/V.  Symptoms onset suddenly 3 hours ago, symptoms are severe, nothing makes better or worse, pain located in epigastrium.   Assessment & Plan:   Principal Problem:   Acute pancreatitis Active Problems:   HTN (hypertension)   DM (diabetes mellitus) type II controlled, neurological manifestation (HCC)   Afib (HCC)  Acute pancreatitis     - IVF: 2L bolus in ED and NS at 125 cc/hr     - Continue zosyn     - NPO except meds     - zofran PRN nausea     - RUQ [place] w/o evidence of acute chole, however, CBD is 44mm; spoke GI, order MRCP if stone detected, call them back     - Dilaudid PRN pain     - MRCP w/o stone in CBD, CBD is 29mm, gallbladder w/ sludge/stones, but no acute chole     - lipase is greatly improved     - WBC and temperature curve overall improved; continue current abx  A.Fib - (A.Flutter at the moment actually)     - Hold coumadin, convert to heparin gtt when INR subtheraputic; INR is still elevated     - Continue metoprolol and cardizem PO  DM2     - Hold metformin     - Sensitive SSI Q4H  HTN     - hypotensive at presentation     - Hold losartan/HCTZ   DVT prophylaxis: SCD Code Status: FULL   Disposition Plan: TBD   Antimicrobials:   Vanc, zosyn    Subjective: Denies complaints this AM  Objective: Vitals:   05/24/19 0008 05/24/19 0312 05/24/19 0350 05/24/19 0815  BP: (!) 134/91 139/79 123/84 132/87  Pulse: (!) 120  (!) 107 (!) 118  Resp: (!) 30 (!) 35 (!) 33 (!) 31  Temp: 100 F (37.8 C) 97.8 F (36.6 C) 100 F (37.8 C)   TempSrc: Axillary Oral Axillary   SpO2: 93% 93% 92% 94%  Weight:      Height:        Intake/Output Summary (Last 24 hours) at 05/24/2019  1507 Last data filed at 05/24/2019 0400 Gross per 24 hour  Intake 1906.8 ml  Output 500 ml  Net 1406.8 ml   Filed Weights   05/22/19 2216 05/23/19 0355  Weight: 99.8 kg 92.9 kg    Examination: General: 78 y.o. female resting in bed in NAD Cardiovascular: tachy, +S1, S2, no m/g/r, equal pulses throughout Respiratory: CTABL, no w/r/r, normal WOB GI: BS+, epigastric TTP, no masses noted, no organomegaly noted MSK: No e/c/c Skin: No rashes, bruises, ulcerations noted Neuro: A&O, no focal deficits    Data Reviewed: I have personally reviewed following labs and imaging studies.  CBC: Recent Labs  Lab 05/22/19 2323 05/22/19 2338 05/23/19 0441 05/24/19 0634  WBC 26.7*  --  13.6* 19.2*  NEUTROABS  --   --   --  16.1*  HGB 15.4* 16.0* 16.6* 16.2*  HCT 46.3* 47.0* 49.5* 48.0*  MCV 92.0  --  89.5 89.2  PLT 246  --  263 546   Basic Metabolic Panel: Recent Labs  Lab 05/22/19 2323 05/22/19 2338 05/23/19 0441 05/24/19 0634  NA 138 137  138 140  K 3.3* 3.2* 3.3* 4.3  CL 102  --  104 106  CO2 23  --  20* 22  GLUCOSE 240*  --  236* 191*  BUN 22  --  18 31*  CREATININE 1.30* 1.20* 1.24* 1.66*  CALCIUM 9.2  --  8.2* 8.4*   GFR: Estimated Creatinine Clearance: 31.4 mL/min (A) (by C-G formula based on SCr of 1.66 mg/dL (H)). Liver Function Tests: Recent Labs  Lab 05/22/19 2323 05/23/19 0441 05/24/19 0634  AST 38 31 45*  ALT 21 22 45*  ALKPHOS 64 42 38  BILITOT 0.8 1.1 1.4*  PROT 6.5 6.2* 5.6*  ALBUMIN 4.2 3.7 2.9*   Recent Labs  Lab 05/22/19 2323 05/24/19 0634  LIPASE 5,920* 373*   No results for input(s): AMMONIA in the last 168 hours. Coagulation Profile: Recent Labs  Lab 05/22/19 2323 05/23/19 0441 05/24/19 0422  INR 2.1* 2.2* 4.1*   Cardiac Enzymes: Recent Labs  Lab 05/22/19 2323  TROPONINI <0.03   BNP (last 3 results) No results for input(s): PROBNP in the last 8760 hours. HbA1C: No results for input(s): HGBA1C in the last 72  hours. CBG: Recent Labs  Lab 05/23/19 2121 05/24/19 0012 05/24/19 0354 05/24/19 0845 05/24/19 1213  GLUCAP 165* 155* 167* 176* 191*   Lipid Profile: No results for input(s): CHOL, HDL, LDLCALC, TRIG, CHOLHDL, LDLDIRECT in the last 72 hours. Thyroid Function Tests: No results for input(s): TSH, T4TOTAL, FREET4, T3FREE, THYROIDAB in the last 72 hours. Anemia Panel: No results for input(s): VITAMINB12, FOLATE, FERRITIN, TIBC, IRON, RETICCTPCT in the last 72 hours. Sepsis Labs: Recent Labs  Lab 05/22/19 2323 05/23/19 0441 05/23/19 1514 05/24/19 0634  LATICACIDVEN 2.3* 2.8* 3.7* 2.8*    Recent Results (from the past 240 hour(s))  SARS Coronavirus 2 (CEPHEID - Performed in [facility] lab), Hosp Order     Status: None   Collection Time: 05/23/19 12:34 AM  Result Value Ref Range Status   SARS Coronavirus 2 NEGATIVE NEGATIVE Final    Comment: (NOTE) If result is NEGATIVE SARS-CoV-2 target nucleic acids are NOT DETECTED. The SARS-CoV-2 RNA is generally detectable in upper and lower  respiratory specimens during the acute phase of infection. The lowest  concentration of SARS-CoV-2 viral copies this assay can detect is 250  copies / mL. A negative result does not preclude SARS-CoV-2 infection  and should not be used as the sole basis for treatment or other  patient management decisions.  A negative result may occur with  improper specimen collection / handling, submission of specimen other  than nasopharyngeal swab, presence of viral mutation(s) within the  areas targeted by this assay, and inadequate number of viral copies  (<250 copies / mL). A negative result must be combined with clinical  observations, patient history, and epidemiological information. If result is POSITIVE SARS-CoV-2 target nucleic acids are DETECTED. The SARS-CoV-2 RNA is generally detectable in upper and lower  respiratory specimens dur ing the acute phase of infection.  Positive  results are  indicative of active infection with SARS-CoV-2.  Clinical  correlation with patient history and other diagnostic information is  necessary to determine patient infection status.  Positive results do  not rule out bacterial infection or co-infection with other viruses. If result is PRESUMPTIVE POSTIVE SARS-CoV-2 nucleic acids MAY BE PRESENT.   A presumptive positive result was obtained on the submitted specimen  and confirmed on repeat testing.  While 2019 novel coronavirus  (SARS-CoV-2) nucleic acids may be  present in the submitted sample  additional confirmatory testing may be necessary for epidemiological  and / or clinical management purposes  to differentiate between  SARS-CoV-2 and other Sarbecovirus currently known to infect humans.  If clinically indicated additional testing with an alternate test  methodology [phone]) is advised. The SARS-CoV-2 RNA is generally  detectable in upper and lower respiratory sp ecimens during the acute  phase of infection. The expected result is Negative. Fact Sheet for Patients:  StrictlyIdeas.no Fact Sheet for Healthcare Providers: BankingDealers.co.za This test is not yet approved or cleared by the [place] FDA and has been authorized for detection and/or diagnosis of SARS-CoV-2 by FDA under an Emergency Use Authorization (EUA).  This EUA will remain in effect (meaning this test can be used) for the duration of the COVID-19 declaration under Section 564(b)(1) of the Act, 21 U.S.C. section 360bbb-3(b)(1), unless the authorization is terminated or revoked sooner. Performed at [facility], [place] [address] [place], [place] [address]          Radiology Studies: Ct Abdomen Pelvis W Contrast  Result Date: 05/23/2019 CLINICAL DATA:  78 year old female with abdominal pain, nausea vomiting. History of GERD and diabetes and hiatal hernia. EXAM: CT ABDOMEN AND PELVIS WITH CONTRAST TECHNIQUE:  Multidetector CT imaging of the abdomen and pelvis was performed using the standard protocol following bolus administration of intravenous contrast. CONTRAST:  21mL OMNIPAQUE IOHEXOL 300 MG/ML  SOLN COMPARISON:  None. FINDINGS: Lower chest: Minimal bibasilar dependent atelectatic changes. There is coronary vascular calcification involving the left circumflex artery. No intra-abdominal free air. Small perihepatic ascites. Hepatobiliary: The liver is unremarkable. There is mild periportal edema. The gallbladder is distended. Small amount of layering stone noted within the gallbladder. No calcified stone identified in the central CBD. Pancreas: There is inflammatory changes of the pancreas with peripancreatic edema most consistent with acute pancreatitis. No abscess or pseudocyst. Spleen: Normal in size without focal abnormality. Adrenals/Urinary Tract: The adrenal glands are unremarkable. There is a 3 cm right renal upper pole cyst as well as additional subcentimeter renal hypodensities which are too small to characterize. There is no hydronephrosis on either side. There is symmetric enhancement and excretion of contrast by both kidneys. The visualized ureters and urinary bladder appear unremarkable. Stomach/Bowel: There is a large hiatal hernia. There is a 2.7 cm diverticulum arising from the third portion of the duodenum. There is colonic diverticulosis without active inflammatory changes. There is no bowel obstruction or active inflammation. The appendix is normal. Vascular/Lymphatic: Moderate aortoiliac atherosclerotic disease. The IVC is unremarkable. No portal venous gas. There is no adenopathy. Reproductive: The uterus is grossly unremarkable. No pelvic mass. Other: None Musculoskeletal: Degenerative changes of the spine. No acute osseous pathology. IMPRESSION: 1. Acute pancreatitis. No abscess or pseudocyst. 2. Colonic diverticulosis. No bowel obstruction or active inflammation. Normal appendix. 3.  Cholelithiasis. 4. Large hiatal hernia. Electronically Signed   By: Anner Crete M.D.   On: 05/23/2019 00:38   Mr 3d Recon At Scanner  Result Date: 05/24/2019 CLINICAL DATA:  Pt unable to follow breathing instructions, best possible images. Cholelithiasis Pancreatitis, acute, first episode, etiol unknown, 2-3 days out 5ml gadavist Cholelithiasis Pancreatitis, acute, first episode, etiol unknown, 2-3 days out EXAM: MRI ABDOMEN WITHOUT AND WITH CONTRAST (INCLUDING MRCP) TECHNIQUE: Multiplanar multisequence MR imaging of the abdomen was performed both before and after the administration of intravenous contrast. Heavily T2-weighted images of the biliary and pancreatic ducts were obtained, and three-dimensional MRCP images were rendered by post processing.  CONTRAST:  9 mL Gadavist COMPARISON:  CT abdomen 05/23/2019 FINDINGS: Lower chest:  Small bilateral pleural effusions. Hepatobiliary: No intrahepatic or extrahepatic biliary duct dilatation. No filling defect within the common bile duct. Common bile duct is upper limits of normal in diameter at 6 mm. Small amount of layering sludge or gallstones within the gallbladder present. No pericholecystic fluid or gallbladder distension. Pancreas: Mild edema within the pancreas. The pancreatic contours are well maintained. There is peripancreatic fluid and fluid extending along the pericolic gutters increased from comparison CT. No organized fluid collections Postcontrast imaging demonstrates fairly uniform enhancement of the pancreatic parenchyma without evidence of necrosis. There is no pancreatic ductal dilatation. No vascular complication associated pancreatitis. Spleen: Normal spleen. Adrenals/urinary tract: Adrenal glands and kidneys are normal. Stomach/Bowel: Moderate hiatal hernia. Increased fluid from the pancreatitis extends into the hernia sac. Vascular/Lymphatic: Abdominal aortic normal caliber. No retroperitoneal periportal lymphadenopathy. Musculoskeletal:  No aggressive osseous lesion IMPRESSION: 1. Acute pancreatitis with increase in peripancreatic fluid extending along the pericolic gutters. No organized collections. No evidence of pancreatic necrosis. 2. No etiology of pancreatitis. Several gallstones within the gallbladder but no cholelithiasis. No biliary obstruction. Imaging findings are not typical of autoimmune pancreatitis. 3. Bilateral pleural effusions, basilar atelectasis and moderate hiatal hernia. Electronically Signed   By: Suzy Bouchard M.D.   On: 05/24/2019 07:46   Dg Chest Port 1 View  Result Date: 05/22/2019 CLINICAL DATA:  Abdominal pain, vomiting EXAM: PORTABLE CHEST 1 VIEW COMPARISON:  12/29/2016 FINDINGS: Cardiomegaly. Moderate-sized hiatal hernia. No confluent opacities, effusions or edema. No acute bony abnormality. IMPRESSION: Cardiomegaly.  Moderate-sized hiatal hernia.  No active disease. Electronically Signed   By: Rolm Baptise M.D.   On: 05/22/2019 23:37   Mr Abdomen Mrcp Moise Boring Contast  Result Date: 05/24/2019 CLINICAL DATA:  Pt unable to follow breathing instructions, best possible images. Cholelithiasis Pancreatitis, acute, first episode, etiol unknown, 2-3 days out 81ml gadavist Cholelithiasis Pancreatitis, acute, first episode, etiol unknown, 2-3 days out EXAM: MRI ABDOMEN WITHOUT AND WITH CONTRAST (INCLUDING MRCP) TECHNIQUE: Multiplanar multisequence MR imaging of the abdomen was performed both before and after the administration of intravenous contrast. Heavily T2-weighted images of the biliary and pancreatic ducts were obtained, and three-dimensional MRCP images were rendered by post processing. CONTRAST:  9 mL Gadavist COMPARISON:  CT abdomen 05/23/2019 FINDINGS: Lower chest:  Small bilateral pleural effusions. Hepatobiliary: No intrahepatic or extrahepatic biliary duct dilatation. No filling defect within the common bile duct. Common bile duct is upper limits of normal in diameter at 6 mm. Small amount of layering sludge  or gallstones within the gallbladder present. No pericholecystic fluid or gallbladder distension. Pancreas: Mild edema within the pancreas. The pancreatic contours are well maintained. There is peripancreatic fluid and fluid extending along the pericolic gutters increased from comparison CT. No organized fluid collections Postcontrast imaging demonstrates fairly uniform enhancement of the pancreatic parenchyma without evidence of necrosis. There is no pancreatic ductal dilatation. No vascular complication associated pancreatitis. Spleen: Normal spleen. Adrenals/urinary tract: Adrenal glands and kidneys are normal. Stomach/Bowel: Moderate hiatal hernia. Increased fluid from the pancreatitis extends into the hernia sac. Vascular/Lymphatic: Abdominal aortic normal caliber. No retroperitoneal periportal lymphadenopathy. Musculoskeletal: No aggressive osseous lesion IMPRESSION: 1. Acute pancreatitis with increase in peripancreatic fluid extending along the pericolic gutters. No organized collections. No evidence of pancreatic necrosis. 2. No etiology of pancreatitis. Several gallstones within the gallbladder but no cholelithiasis. No biliary obstruction. Imaging findings are not typical of autoimmune pancreatitis. 3. Bilateral pleural effusions, basilar atelectasis  and moderate hiatal hernia. Electronically Signed   By: Suzy Bouchard M.D.   On: 05/24/2019 07:46   US Abdomen Limited Ruq  Result Date: 05/23/2019 CLINICAL DATA:  78 year old female with right upper quadrant abdominal pain. EXAM: ULTRASOUND ABDOMEN LIMITED RIGHT UPPER QUADRANT COMPARISON:  CT of the abdomen pelvis dated 05/23/2019 FINDINGS: Evaluation is limited due to patient's body habitus. Gallbladder: The gallbladder is distended. There is sludge and stone within the gallbladder. There is no gallbladder wall thickening or pericholecystic fluid. Negative sonographic Murphy's sign. Common bile duct: Diameter: 9 mm Liver: The liver is unremarkable as  visualized. Portal vein is patent on color Doppler imaging with normal direction of blood flow towards the liver. IMPRESSION: Cholelithiasis without sonographic evidence of acute cholecystitis. Electronically Signed   By: Anner Crete M.D.   On: 05/23/2019 02:04        Scheduled Meds:  diltiazem  90 mg Oral BID   insulin aspart  0-9 Units Subcutaneous Q4H   metoprolol tartrate  37.5 mg Oral BID   sodium chloride flush  3 mL Intravenous Once   Continuous Infusions:  sodium chloride 125 mL/hr at 05/24/19 0552   piperacillin-tazobactam (ZOSYN)  IV 3.375 g (05/24/19 1243)   sodium chloride       LOS: 1 day    Time spent: 25 minutes spent in the coordination of care.     Jonnie Finner, DO [facility] Pager [phone]  If 7PM-7AM, please contact night-coverage [email or web address] Password [email or web address] 05/24/2019, 3:07 PM

## 2019-05-25 ENCOUNTER — Inpatient Hospital Stay (HOSPITAL_COMMUNITY): Payer: Medicare HMO

## 2019-05-25 ENCOUNTER — Telehealth: Payer: Self-pay | Admitting: Cardiovascular Disease

## 2019-05-25 LAB — COMPREHENSIVE METABOLIC PANEL
ALT: 29 U/L (ref 0–44)
AST: 25 U/L (ref 15–41)
Albumin: 2.4 g/dL — ABNORMAL LOW (ref 3.5–5.0)
Alkaline Phosphatase: 43 U/L (ref 38–126)
Anion gap: 11 (ref 5–15)
BUN: 32 mg/dL — ABNORMAL HIGH (ref 8–23)
CO2: 21 mmol/L — ABNORMAL LOW (ref 22–32)
Calcium: 8 mg/dL — ABNORMAL LOW (ref 8.9–10.3)
Chloride: 109 mmol/L (ref 98–111)
Creatinine, Ser: 1.35 mg/dL — ABNORMAL HIGH (ref 0.44–1.00)
GFR calc Af Amer: 44 mL/min — ABNORMAL LOW (ref >60)
GFR calc non Af Amer: 38 mL/min — ABNORMAL LOW (ref >60)
Glucose, Bld: 181 mg/dL — ABNORMAL HIGH (ref 70–99)
Potassium: 3.6 mmol/L (ref 3.5–5.1)
Sodium: 141 mmol/L (ref 135–145)
Total Bilirubin: 1 mg/dL (ref 0.3–1.2)
Total Protein: 5.6 g/dL — ABNORMAL LOW (ref 6.5–8.1)

## 2019-05-25 LAB — VITAMIN B12: Vitamin B-12: 3941 pg/mL — ABNORMAL HIGH (ref 180–914)

## 2019-05-25 LAB — CBC WITH DIFFERENTIAL/PLATELET
Abs Immature Granulocytes: 0.13 10*3/uL — ABNORMAL HIGH (ref 0.00–0.07)
Basophils Absolute: 0 10*3/uL (ref 0.0–0.1)
Basophils Relative: 0 %
Eosinophils Absolute: 0.1 10*3/uL (ref 0.0–0.5)
Eosinophils Relative: 0 %
HCT: 42.8 % (ref 36.0–46.0)
Hemoglobin: 14.2 g/dL (ref 12.0–15.0)
Immature Granulocytes: 1 %
Lymphocytes Relative: 6 %
Lymphs Abs: 0.9 10*3/uL (ref 0.7–4.0)
MCH: 30 pg (ref 26.0–34.0)
MCHC: 33.2 g/dL (ref 30.0–36.0)
MCV: 90.3 fL (ref 80.0–100.0)
Monocytes Absolute: 0.4 10*3/uL (ref 0.1–1.0)
Monocytes Relative: 3 %
Neutro Abs: 13.5 10*3/uL — ABNORMAL HIGH (ref 1.7–7.7)
Neutrophils Relative %: 90 %
Platelets: 194 10*3/uL (ref 150–400)
RBC: 4.74 MIL/uL (ref 3.87–5.11)
RDW: 14.1 % (ref 11.5–15.5)
WBC Morphology: INCREASED
WBC: 15 10*3/uL — ABNORMAL HIGH (ref 4.0–10.5)
nRBC: 0 % (ref 0.0–0.2)

## 2019-05-25 LAB — FOLATE: Folate: 9 ng/mL (ref >5.9)

## 2019-05-25 LAB — BLOOD GAS, ARTERIAL
Acid-base deficit: 2.8 mmol/L — ABNORMAL HIGH (ref 0.0–2.0)
Bicarbonate: 20.9 mmol/L (ref 20.0–28.0)
Drawn by: 347191
O2 Content: 3 L/min
O2 Saturation: 90.7 %
Patient temperature: 99.3
pCO2 arterial: 33 mmHg (ref 32.0–48.0)
pH, Arterial: 7.419 (ref 7.350–7.450)
pO2, Arterial: 62.7 mmHg — ABNORMAL LOW (ref 83.0–108.0)

## 2019-05-25 LAB — MAGNESIUM: Magnesium: 1.7 mg/dL (ref 1.7–2.4)

## 2019-05-25 LAB — GLUCOSE, CAPILLARY
Glucose-Capillary: 158 mg/dL — ABNORMAL HIGH (ref 70–99)
Glucose-Capillary: 161 mg/dL — ABNORMAL HIGH (ref 70–99)
Glucose-Capillary: 169 mg/dL — ABNORMAL HIGH (ref 70–99)
Glucose-Capillary: 178 mg/dL — ABNORMAL HIGH (ref 70–99)
Glucose-Capillary: 182 mg/dL — ABNORMAL HIGH (ref 70–99)
Glucose-Capillary: 186 mg/dL — ABNORMAL HIGH (ref 70–99)

## 2019-05-25 LAB — LACTIC ACID, PLASMA: Lactic Acid, Venous: 2 mmol/L (ref 0.5–1.9)

## 2019-05-25 LAB — LIPASE, BLOOD: Lipase: 91 U/L — ABNORMAL HIGH (ref 11–51)

## 2019-05-25 LAB — AMMONIA: Ammonia: 27 umol/L (ref 9–35)

## 2019-05-25 LAB — PROTIME-INR
INR: 3.8 — ABNORMAL HIGH (ref 0.8–1.2)
Prothrombin Time: 37.2 seconds — ABNORMAL HIGH (ref 11.4–15.2)

## 2019-05-25 LAB — T4, FREE: Free T4: 0.96 ng/dL (ref 0.82–1.77)

## 2019-05-25 LAB — TSH: TSH: 0.908 u[IU]/mL (ref 0.350–4.500)

## 2019-05-25 MED ORDER — FUROSEMIDE 10 MG/ML IJ SOLN
40.0000 mg | Freq: Once | INTRAMUSCULAR | Status: AC
  Administered 2019-05-25: 40 mg via INTRAVENOUS
  Filled 2019-05-25: qty 4

## 2019-05-25 MED ORDER — CLONIDINE HCL 0.1 MG/24HR TD PTWK
0.1000 mg | MEDICATED_PATCH | TRANSDERMAL | Status: DC
  Administered 2019-05-25: 0.1 mg via TRANSDERMAL
  Filled 2019-05-25: qty 1

## 2019-05-25 MED ORDER — DILTIAZEM HCL-DEXTROSE 100-5 MG/100ML-% IV SOLN (PREMIX)
5.0000 mg/h | INTRAVENOUS | Status: DC
  Administered 2019-05-25: 10 mg/h via INTRAVENOUS
  Administered 2019-05-25: 15:00:00 5 mg/h via INTRAVENOUS
  Administered 2019-05-26 (×2): 15 mg/h via INTRAVENOUS
  Administered 2019-05-27 (×2): 10 mg/h via INTRAVENOUS
  Administered 2019-05-27: 15 mg/h via INTRAVENOUS
  Administered 2019-05-28: 10 mg/h via INTRAVENOUS
  Administered 2019-05-28: 15 mg/h via INTRAVENOUS
  Filled 2019-05-25 (×11): qty 100

## 2019-05-25 MED ORDER — METOPROLOL TARTRATE 5 MG/5ML IV SOLN
5.0000 mg | Freq: Once | INTRAVENOUS | Status: AC
  Administered 2019-05-25: 5 mg via INTRAVENOUS
  Filled 2019-05-25: qty 5

## 2019-05-25 MED ORDER — METOPROLOL TARTRATE 5 MG/5ML IV SOLN
5.0000 mg | Freq: Four times a day (QID) | INTRAVENOUS | Status: DC | PRN
  Administered 2019-05-25 – 2019-05-27 (×4): 5 mg via INTRAVENOUS
  Filled 2019-05-25 (×6): qty 5

## 2019-05-25 NOTE — Progress Notes (Signed)
pts diltiazem increased to 81ml/hr

## 2019-05-25 NOTE — Progress Notes (Signed)
Pharmacy is unable to confirm the medications the patient was taking at home. All options have been exhausted and a resolution to the situation is not expected.   Where possible, their outpatient pharmacy(s) have been contacted for the last time prescriptions were filled and that information has been added to each medication in an Order Note (highlighted yellow below the medication).  Please contact pharmacy if further assistance is needed.   Romeo Rabon, PharmD. Mobile: [phone]. 05/25/2019,1:23 PM.

## 2019-05-25 NOTE — Telephone Encounter (Signed)
Patient daughter was calling to inform Dr. Burt Knack her mother is currently in the hospital. [id number]

## 2019-05-25 NOTE — Telephone Encounter (Signed)
thx for letting me know 

## 2019-05-25 NOTE — Progress Notes (Signed)
IV diltiazem hung as ordered at 88ml /hr. Pt bp 167/102 heart rate up to 129 on monitor. Will monitor patient. Rasha Ibe, Bettina Gavia rN

## 2019-05-25 NOTE — Progress Notes (Signed)
ANTICOAGULATION CONSULT NOTE - -follow up  Pharmacy Consult for heparin when INR <2  While coumadin on hold Indication: h/o  atrial fibrillation  Allergies  Allergen Reactions  . Niacin And Related Rash  . Keflex [Cephalexin] Other (See Comments)    Fatigue, sore throat, body aches  . Sausage [Pickled Meat] Other (See Comments)    Drowsiness and dizziness  . Tape Rash    Bandaids     Patient Measurements: Height: 5\' 4"  (162.6 cm) Weight: 204 lb 12.9 oz (92.9 kg) IBW/kg (Calculated) : 54.7 Heparin Dosing Weight: 75kg  Vital Signs: Temp: 98.7 F (37.1 C) (06/03 0405) Temp Source: Axillary (06/03 0405) BP: 151/102 (06/03 0405) Pulse Rate: 119 (06/03 0405)  Labs: Recent Labs    05/22/19 2323  05/23/19 0441 05/24/19 0422 05/24/19 0634 05/25/19 0435  HGB 15.4*   < > 16.6*  --  16.2* 14.2  HCT 46.3*   < > 49.5*  --  48.0* 42.8  PLT 246  --  263  --  220 194  LABPROT 23.1*  --  24.4* 39.3*  --  37.2*  INR 2.1*  --  2.2* 4.1*  --  3.8*  CREATININE 1.30*   < > 1.24*  --  1.66* 1.35*  TROPONINI <0.03  --   --   --   --   --    < > = values in this interval not displayed.    Estimated Creatinine Clearance: 38.6 mL/min (A) (by C-G formula based on SCr of 1.35 mg/dL (H)).   Medical History: Past Medical History:  Diagnosis Date  . Allergic rhinitis   . Arthritis   . Diabetes mellitus   . GERD (gastroesophageal reflux disease)   . Hyperlipidemia   . Hypertension   . Pain in joint, ankle and foot 09/14/2013    Medications:  Medications Prior to Admission  Medication Sig Dispense Refill Last Dose  . Ascorbic Acid (VITAMIN C) 1000 MG tablet Take 1,000 mg by mouth 2 (two) times daily.   Taking  . Calcium Carb-Cholecalciferol (CALTRATE 600+D3 SOFT PO) Take 1 tablet by mouth 2 (two) times daily.   Taking  . CRANBERRY PO Take 2 tablets by mouth daily.   Taking  . Cyanocobalamin (VITAMIN B-12) 1000 MCG SUBL Place 1 each under the tongue daily.   Taking  . diltiazem  (CARDIZEM) 90 MG tablet TAKE 1 TABLET (90 MG TOTAL) BY MOUTH 2 (TWO) TIMES DAILY. 180 tablet 1   . fish oil-omega-3 fatty acids 1000 MG capsule Take 2 g by mouth daily.   Taking  . fluticasone (FLONASE) 50 MCG/ACT nasal spray Place 2 sprays into both nostrils daily as needed for allergies or rhinitis.   Taking  . furosemide (LASIX) 20 MG tablet TAKE 1 TABLET BY MOUTH EVERY DAY (Patient taking differently: Take 20 mg by mouth daily. ) 90 tablet 1 Taking  . hydrocortisone 2.5 % cream APPLY TO THE FACE TWICE A DAY AS NEEDED 1 WEEK ON THEN 1 WEEK OFF  0 Taking  . lisinopril-hydrochlorothiazide (PRINZIDE,ZESTORETIC) 20-12.5 MG tablet TAKE 1 TABLET BY MOUTH EVERY DAY (Patient taking differently: Take 1 tablet by mouth daily. ) 90 tablet 1 Taking  . loperamide (IMODIUM A-D) 2 MG capsule Take by mouth as needed for diarrhea or loose stools.   Taking  . metFORMIN (GLUCOPHAGE) 500 MG tablet TAKE 1 TABLET (500 MG TOTAL) BY MOUTH 2 (TWO) TIMES DAILY WITH A MEAL. 180 tablet 1 Taking  . metoprolol tartrate (LOPRESSOR) 25 MG  tablet Take 1.5 tablets (37.5 mg total) by mouth 2 (two) times daily. (Patient taking differently: Take 25 mg by mouth 2 (two) times daily. ) 180 tablet 1 Taking  . omeprazole (PRILOSEC) 20 MG capsule TAKE 1 CAPSULE BY MOUTH EVERY DAY (Patient taking differently: Take 20 mg by mouth daily. ) 90 capsule 1   . pravastatin (PRAVACHOL) 40 MG tablet Take 1 tablet (40 mg total) by mouth daily. 90 tablet 3 Taking  . Probiotic Product (PROBIOTIC PO) Take 1 capsule by mouth daily.    Taking  . sulfamethoxazole-trimethoprim (BACTRIM DS) 800-160 MG tablet Take 1 tablet by mouth 2 (two) times daily. 10 tablet 0   . vitamin E 100 UNIT capsule Take by mouth.   Taking  . warfarin (COUMADIN) 5 MG tablet TAKE AS DIRECTED BY COUMADIN CLINIC (Patient taking differently: Take 5-7.5 mg by mouth See admin instructions. Take 1 1/2 tablet (7.5 mg) by mouth on Monday, Wednesday, Friday, take 1 tablet (5 mg) on Sunday,  Tuesday, Thursday, Saturday - or as directed by Coumadin Clinic) 120 tablet 0    Scheduled:  . diltiazem  90 mg Oral BID  . insulin aspart  0-9 Units Subcutaneous Q4H  . metoprolol tartrate  37.5 mg Oral BID  . sodium chloride flush  10-40 mL Intracatheter Q12H  . sodium chloride flush  3 mL Intravenous Once   Infusions:  . sodium chloride 125 mL/hr at 05/25/19 0356  . piperacillin-tazobactam (ZOSYN)  IV 3.375 g (05/25/19 0649)  . sodium chloride      Assessment: 78yo female c/o N/V, admitted for acute pancreatitis, to transition from Coumadin to heparin while NPO Today INR is 3.8. AST/ALT improved to wnl.  H/H wnl/stable. No bleeding noted. No need for IV heparin at this time.   Goal of Therapy:  Heparin level 0.3-0.7 units/ml Monitor platelets by anticoagulation protocol: Yes   Plan:  Will monitor INR daily and begin heparin when INR <2. F/u when plan to resume warfarin   Nicole Cella, [place] Clinical Pharmacist Pager: [phone] Please check AMION for all [place] phone numbers After 10:00 PM, call [place] [phone] 05/25/2019,10:21 AM

## 2019-05-25 NOTE — Progress Notes (Signed)
[name]  PROGRESS NOTE    Melanie [name]  ZOX:[id number] DOB: [birth year] DOA: 05/22/2019 PCP: [name], MD   Brief Narrative:   Melanie [name] is a 78 y.o. female with medical history significant of DM2, HTN.  Patient presents to the ED with c/o epigastric abd pain, N/V.  Symptoms onset suddenly 3 hours ago, symptoms are severe, nothing makes better or worse, pain located in epigastrium.   Assessment & Plan: Acute pancreatitis     - IVF: 2L bolus in ED and NS at 125 cc/hr     - Continue zosyn     - NPO except meds     - zofran PRN nausea     - RUQ [place] w/o evidence of acute chole, however, CBD is 62mm; spoke GI, order MRCP if stone detected, call them back     - Dilaudid PRN pain     - MRCP w/o stone in CBD, CBD is 40mm, gallbladder w/ sludge/stones, but no acute chole     - lipase is greatly improved     - WBC and temperature curve overall improved; continue current Antibiotics, will eventually require general surgery consultation for lap chole  A.Fib - (A.Flutter at the moment actually)     - Hold coumadin, convert to heparin gtt when INR subtheraputic; INR is still elevated     -Patient unable to take anything by mouth.  We will transition to IV Cardizem infusion as well as IV Lopressor as needed.  Add clonidine patch.  DM2     - Hold metformin     - Sensitive SSI Q4H  HTN     - hypotensive at presentation     - Hold losartan/HCTZ  Acute encephalopathy. Likely multifactorial. CT head unremarkable. We will get further work-up.  Check EEG. Discussed with son at her baseline the patient does have some issues with memory but this current presentation is more different than her baseline.  DVT prophylaxis: SCD Code Status: FULL   Disposition Plan: TBD   Antimicrobials:   Vanc, zosyn    Subjective: Patient is reportedly confused since yesterday.  Now not acting herself.  Repeating same then again and again.  No nausea no vomiting.  No p.o. intake at  all.  Objective: Vitals:   05/25/19 1615 05/25/19 1630 05/25/19 1700 05/25/19 1800  BP: (!) 169/86 (!) 165/91 (!) 163/86 (!) 160/114  Pulse: 81 69 (!) 123   Resp: (!) 25 (!) 41 (!) 30 (!) 28  Temp:      TempSrc:      SpO2: 94% 95% 94%   Weight:      Height:        Intake/Output Summary (Last 24 hours) at 05/25/2019 1926 Last data filed at 05/25/2019 1528 Gross per 24 hour  Intake 3309.05 ml  Output 350 ml  Net 2959.05 ml   Filed Weights   05/22/19 2216 05/23/19 0355  Weight: 99.8 kg 92.9 kg    Examination: General: lethargic and not oriented to time, place, and person. Appear in marked distress, affect emotionally labile Eyes: PERRL, Conjunctiva normal ENT: Oral Mucosa Clear, dry  Neck: NO JVD, NO Abnormal Mass Or lumps Cardiovascular: S1 and S2 Present, NO Murmur, peripheral pulses symmetrical Respiratory: normal respiratory effort, Bilateral Air entry equal and Decreased, NO use of accessory muscle, Clear to Auscultation, NO Crackles, NO wheezes Abdomen: Bowel Sound PREsent, Soft and difuse tenderness, no hernia Skin: no rashes  Extremities: no Pedal edema, no calf tenderness Neurologic: PERLA,  Motor strength 5/5 and symmetric and sensation grossly normal Gait not checked due to patient safety concerns   Data Reviewed: I have personally reviewed following labs and imaging studies.  CBC: Recent Labs  Lab 05/22/19 2323 05/22/19 2338 05/23/19 0441 05/24/19 0634 05/25/19 0435  WBC 26.7*  --  13.6* 19.2* 15.0*  NEUTROABS  --   --   --  16.1* 13.5*  HGB 15.4* 16.0* 16.6* 16.2* 14.2  HCT 46.3* 47.0* 49.5* 48.0* 42.8  MCV 92.0  --  89.5 89.2 90.3  PLT 246  --  263 220 209   Basic Metabolic Panel: Recent Labs  Lab 05/22/19 2323 05/22/19 2338 05/23/19 0441 05/24/19 0634 05/25/19 0435  NA 138 137 138 140 141  K 3.3* 3.2* 3.3* 4.3 3.6  CL 102  --  104 106 109  CO2 23  --  20* 22 21*  GLUCOSE 240*  --  236* 191* 181*  BUN 22  --  18 31* 32*  CREATININE 1.30*  1.20* 1.24* 1.66* 1.35*  CALCIUM 9.2  --  8.2* 8.4* 8.0*  MG  --   --   --   --  1.7   GFR: Estimated Creatinine Clearance: 38.6 mL/min (A) (by C-G formula based on SCr of 1.35 mg/dL (H)). Liver Function Tests: Recent Labs  Lab 05/22/19 2323 05/23/19 0441 05/24/19 0634 05/25/19 0435  AST 38 31 45* 25  ALT 21 22 45* 29  ALKPHOS 64 42 38 43  BILITOT 0.8 1.1 1.4* 1.0  PROT 6.5 6.2* 5.6* 5.6*  ALBUMIN 4.2 3.7 2.9* 2.4*   Recent Labs  Lab 05/22/19 2323 05/24/19 0634 05/25/19 1428  LIPASE 5,920* 373* 91*   Recent Labs  Lab 05/25/19 1428  AMMONIA 27   Coagulation Profile: Recent Labs  Lab 05/22/19 2323 05/23/19 0441 05/24/19 0422 05/25/19 0435  INR 2.1* 2.2* 4.1* 3.8*   Cardiac Enzymes: Recent Labs  Lab 05/22/19 2323  TROPONINI <0.03   BNP (last 3 results) No results for input(s): PROBNP in the last 8760 hours. HbA1C: No results for input(s): HGBA1C in the last 72 hours. CBG: Recent Labs  Lab 05/24/19 2025 05/24/19 2358 05/25/19 0407 05/25/19 1222 05/25/19 1617  GLUCAP 153* 182* 161* 186* 158*   Lipid Profile: No results for input(s): CHOL, HDL, LDLCALC, TRIG, CHOLHDL, LDLDIRECT in the last 72 hours. Thyroid Function Tests: Recent Labs    05/25/19 1428  TSH 0.908  FREET4 0.96   Anemia Panel: Recent Labs    05/25/19 1428  VITAMINB12 3,941*  FOLATE 9.0   Sepsis Labs: Recent Labs  Lab 05/23/19 0441 05/23/19 1514 05/24/19 0634 05/25/19 1428  LATICACIDVEN 2.8* 3.7* 2.8* 2.0*    Recent Results (from the past 240 hour(s))  SARS Coronavirus 2 (CEPHEID - Performed in [place] hospital lab), Hosp Order     Status: None   Collection Time: 05/23/19 12:34 AM  Result Value Ref Range Status   SARS Coronavirus 2 NEGATIVE NEGATIVE Final    Comment: (NOTE) If result is NEGATIVE SARS-CoV-2 target nucleic acids are NOT DETECTED. The SARS-CoV-2 RNA is generally detectable in upper and lower  respiratory specimens during the acute phase of  infection. The lowest  concentration of SARS-CoV-2 viral copies this assay can detect is 250  copies / mL. A negative result does not preclude SARS-CoV-2 infection  and should not be used as the sole basis for treatment or other  patient management decisions.  A negative result may occur with  improper specimen collection /  handling, submission of specimen other  than nasopharyngeal swab, presence of viral mutation(s) within the  areas targeted by this assay, and inadequate number of viral copies  (<250 copies / mL). A negative result must be combined with clinical  observations, patient history, and epidemiological information. If result is POSITIVE SARS-CoV-2 target nucleic acids are DETECTED. The SARS-CoV-2 RNA is generally detectable in upper and lower  respiratory specimens dur ing the acute phase of infection.  Positive  results are indicative of active infection with SARS-CoV-2.  Clinical  correlation with patient history and other diagnostic information is  necessary to determine patient infection status.  Positive results do  not rule out bacterial infection or co-infection with other viruses. If result is PRESUMPTIVE POSTIVE SARS-CoV-2 nucleic acids MAY BE PRESENT.   A presumptive positive result was obtained on the submitted specimen  and confirmed on repeat testing.  While 2019 novel coronavirus  (SARS-CoV-2) nucleic acids may be present in the submitted sample  additional confirmatory testing may be necessary for epidemiological  and / or clinical management purposes  to differentiate between  SARS-CoV-2 and other Sarbecovirus currently known to infect humans.  If clinically indicated additional testing with an alternate test  methodology [id number]) is advised. The SARS-CoV-2 RNA is generally  detectable in upper and lower respiratory sp ecimens during the acute  phase of infection. The expected result is Negative. Fact Sheet for Patients:   StrictlyIdeas.no Fact Sheet for Healthcare Providers: BankingDealers.co.za This test is not yet approved or cleared by the [place] FDA and has been authorized for detection and/or diagnosis of SARS-CoV-2 by FDA under an Emergency Use Authorization (EUA).  This EUA will remain in effect (meaning this test can be used) for the duration of the COVID-19 declaration under Section 564(b)(1) of the Act, 21 U.S.C. section 360bbb-3(b)(1), unless the authorization is terminated or revoked sooner. Performed at [facility], [place] [address], [place], [place] [address]          Radiology Studies: Ct Head Wo Contrast  Result Date: 05/25/2019 CLINICAL DATA:  Altered mental status. EXAM: CT HEAD WITHOUT CONTRAST TECHNIQUE: Contiguous axial images were obtained from the base of the skull through the vertex without intravenous contrast. COMPARISON:  12/01/2012. FINDINGS: Brain: No evidence of acute infarction, hemorrhage, hydrocephalus, extra-axial collection or mass lesion/mass effect. Generalized atrophy. Chronic microvascular ischemic change. Vascular: Calcification of the cavernous internal carotid arteries consistent with cerebrovascular atherosclerotic disease. No signs of intracranial large vessel occlusion. Skull: Calvarium intact. Sinuses/Orbits: No acute finding.  Chronic sphenoid sinus disease. Other: None IMPRESSION: Atrophy and small vessel disease. No acute intracranial findings. Similar appearance to priors. Electronically Signed   By: Staci Righter M.D.   On: 05/25/2019 14:12   Mr 3d Recon At Scanner  Result Date: 05/24/2019 CLINICAL DATA:  Pt unable to follow breathing instructions, best possible images. Cholelithiasis Pancreatitis, acute, first episode, etiol unknown, 2-3 days out 60ml gadavist Cholelithiasis Pancreatitis, acute, first episode, etiol unknown, 2-3 days out EXAM: MRI ABDOMEN WITHOUT AND WITH CONTRAST (INCLUDING MRCP)  TECHNIQUE: Multiplanar multisequence MR imaging of the abdomen was performed both before and after the administration of intravenous contrast. Heavily T2-weighted images of the biliary and pancreatic ducts were obtained, and three-dimensional MRCP images were rendered by post processing. CONTRAST:  9 mL Gadavist COMPARISON:  CT abdomen 05/23/2019 FINDINGS: Lower chest:  Small bilateral pleural effusions. Hepatobiliary: No intrahepatic or extrahepatic biliary duct dilatation. No filling defect within the common bile duct. Common bile duct is upper  limits of normal in diameter at 6 mm. Small amount of layering sludge or gallstones within the gallbladder present. No pericholecystic fluid or gallbladder distension. Pancreas: Mild edema within the pancreas. The pancreatic contours are well maintained. There is peripancreatic fluid and fluid extending along the pericolic gutters increased from comparison CT. No organized fluid collections Postcontrast imaging demonstrates fairly uniform enhancement of the pancreatic parenchyma without evidence of necrosis. There is no pancreatic ductal dilatation. No vascular complication associated pancreatitis. Spleen: Normal spleen. Adrenals/urinary tract: Adrenal glands and kidneys are normal. Stomach/Bowel: Moderate hiatal hernia. Increased fluid from the pancreatitis extends into the hernia sac. Vascular/Lymphatic: Abdominal aortic normal caliber. No retroperitoneal periportal lymphadenopathy. Musculoskeletal: No aggressive osseous lesion IMPRESSION: 1. Acute pancreatitis with increase in peripancreatic fluid extending along the pericolic gutters. No organized collections. No evidence of pancreatic necrosis. 2. No etiology of pancreatitis. Several gallstones within the gallbladder but no cholelithiasis. No biliary obstruction. Imaging findings are not typical of autoimmune pancreatitis. 3. Bilateral pleural effusions, basilar atelectasis and moderate hiatal hernia. Electronically  Signed   By: Suzy Bouchard M.D.   On: 05/24/2019 07:46   Mr Abdomen Mrcp Moise Boring Contast  Result Date: 05/24/2019 CLINICAL DATA:  Pt unable to follow breathing instructions, best possible images. Cholelithiasis Pancreatitis, acute, first episode, etiol unknown, 2-3 days out 56ml gadavist Cholelithiasis Pancreatitis, acute, first episode, etiol unknown, 2-3 days out EXAM: MRI ABDOMEN WITHOUT AND WITH CONTRAST (INCLUDING MRCP) TECHNIQUE: Multiplanar multisequence MR imaging of the abdomen was performed both before and after the administration of intravenous contrast. Heavily T2-weighted images of the biliary and pancreatic ducts were obtained, and three-dimensional MRCP images were rendered by post processing. CONTRAST:  9 mL Gadavist COMPARISON:  CT abdomen 05/23/2019 FINDINGS: Lower chest:  Small bilateral pleural effusions. Hepatobiliary: No intrahepatic or extrahepatic biliary duct dilatation. No filling defect within the common bile duct. Common bile duct is upper limits of normal in diameter at 6 mm. Small amount of layering sludge or gallstones within the gallbladder present. No pericholecystic fluid or gallbladder distension. Pancreas: Mild edema within the pancreas. The pancreatic contours are well maintained. There is peripancreatic fluid and fluid extending along the pericolic gutters increased from comparison CT. No organized fluid collections Postcontrast imaging demonstrates fairly uniform enhancement of the pancreatic parenchyma without evidence of necrosis. There is no pancreatic ductal dilatation. No vascular complication associated pancreatitis. Spleen: Normal spleen. Adrenals/urinary tract: Adrenal glands and kidneys are normal. Stomach/Bowel: Moderate hiatal hernia. Increased fluid from the pancreatitis extends into the hernia sac. Vascular/Lymphatic: Abdominal aortic normal caliber. No retroperitoneal periportal lymphadenopathy. Musculoskeletal: No aggressive osseous lesion IMPRESSION: 1. Acute  pancreatitis with increase in peripancreatic fluid extending along the pericolic gutters. No organized collections. No evidence of pancreatic necrosis. 2. No etiology of pancreatitis. Several gallstones within the gallbladder but no cholelithiasis. No biliary obstruction. Imaging findings are not typical of autoimmune pancreatitis. 3. Bilateral pleural effusions, basilar atelectasis and moderate hiatal hernia. Electronically Signed   By: Suzy Bouchard M.D.   On: 05/24/2019 07:46        Scheduled Meds:  cloNIDine  0.1 mg Transdermal Weekly   insulin aspart  0-9 Units Subcutaneous Q4H   sodium chloride flush  10-40 mL Intracatheter Q12H   sodium chloride flush  3 mL Intravenous Once   Continuous Infusions:  sodium chloride 125 mL/hr at 05/25/19 0356   diltiazem (CARDIZEM) infusion 10 mg/hr (05/25/19 1745)   piperacillin-tazobactam (ZOSYN)  IV Stopped (05/25/19 1532)   sodium chloride  LOS: 2 days    Time spent: 35 minutes spent in the coordination of care.   Author:  Berle Mull, MD [facility] 05/25/2019   To reach On-call, see care teams to locate the attending and reach out to them via [email or web address]. If 7PM-7AM, please contact night-coverage If you still have difficulty reaching the attending provider, please page the [facility] (Director on Call) for [facility] on amion for assistance.

## 2019-05-25 NOTE — Progress Notes (Signed)
RT called to pt room by RN d/t pt having increased WOB and increase in O2 needs. RT assessed pt and decided to do ABG. RT obtained ABG with the following results. RN advised to increase pt O2 d/t pts PaO2 is 62.7. RT will continue to monitor   Results for KIAN, GAMARRA (MRN [id number]) as of 05/25/2019 21:06  Ref. Range 05/25/2019 20:18  Sample type Unknown ARTERIAL DRAW  Delivery systems Unknown NASAL CANNULA  O2 Content Latest Units: L/min 3.0  pH, Arterial Latest Ref Range: 7.350 - 7.450  7.419  pCO2 arterial Latest Ref Range: 32.0 - 48.0 mmHg 33.0  pO2, Arterial Latest Ref Range: 83.0 - 108.0 mmHg 62.7 (L)  Acid-base deficit Latest Ref Range: 0.0 - 2.0 mmol/L 2.8 (H)  Bicarbonate Latest Ref Range: 20.0 - 28.0 mmol/L 20.9  O2 Saturation Latest Units: % 90.7  Patient temperature Unknown 99.3  Collection site Unknown RIGHT RADIAL  Allens test (pass/fail) Latest Ref Range: PASS  PASS

## 2019-05-26 ENCOUNTER — Inpatient Hospital Stay (HOSPITAL_COMMUNITY): Payer: Medicare HMO

## 2019-05-26 DIAGNOSIS — E860 Dehydration: Secondary | ICD-10-CM

## 2019-05-26 LAB — RAPID URINE DRUG SCREEN, HOSP PERFORMED
Amphetamines: NOT DETECTED
Barbiturates: NOT DETECTED
Benzodiazepines: NOT DETECTED
Cocaine: NOT DETECTED
Opiates: POSITIVE — AB
Tetrahydrocannabinol: NOT DETECTED

## 2019-05-26 LAB — COMPREHENSIVE METABOLIC PANEL WITH GFR
ALT: 24 U/L (ref 0–44)
AST: 22 U/L (ref 15–41)
Albumin: 2.4 g/dL — ABNORMAL LOW (ref 3.5–5.0)
Alkaline Phosphatase: 63 U/L (ref 38–126)
Anion gap: 10 (ref 5–15)
BUN: 25 mg/dL — ABNORMAL HIGH (ref 8–23)
CO2: 22 mmol/L (ref 22–32)
Calcium: 8 mg/dL — ABNORMAL LOW (ref 8.9–10.3)
Chloride: 113 mmol/L — ABNORMAL HIGH (ref 98–111)
Creatinine, Ser: 1.11 mg/dL — ABNORMAL HIGH (ref 0.44–1.00)
GFR calc Af Amer: 55 mL/min — ABNORMAL LOW (ref >60)
GFR calc non Af Amer: 48 mL/min — ABNORMAL LOW (ref >60)
Glucose, Bld: 188 mg/dL — ABNORMAL HIGH (ref 70–99)
Potassium: 3 mmol/L — ABNORMAL LOW (ref 3.5–5.1)
Sodium: 145 mmol/L (ref 135–145)
Total Bilirubin: 1.1 mg/dL (ref 0.3–1.2)
Total Protein: 5.6 g/dL — ABNORMAL LOW (ref 6.5–8.1)

## 2019-05-26 LAB — PROTIME-INR
INR: 4 — ABNORMAL HIGH (ref 0.8–1.2)
Prothrombin Time: 38.3 s — ABNORMAL HIGH (ref 11.4–15.2)

## 2019-05-26 LAB — CBC
HCT: 39.6 % (ref 36.0–46.0)
Hemoglobin: 13.4 g/dL (ref 12.0–15.0)
MCH: 30.2 pg (ref 26.0–34.0)
MCHC: 33.8 g/dL (ref 30.0–36.0)
MCV: 89.4 fL (ref 80.0–100.0)
Platelets: 210 K/uL (ref 150–400)
RBC: 4.43 MIL/uL (ref 3.87–5.11)
RDW: 14.1 % (ref 11.5–15.5)
WBC: 17 K/uL — ABNORMAL HIGH (ref 4.0–10.5)
nRBC: 0 % (ref 0.0–0.2)

## 2019-05-26 LAB — GLUCOSE, CAPILLARY
Glucose-Capillary: 189 mg/dL — ABNORMAL HIGH (ref 70–99)
Glucose-Capillary: 175 mg/dL — ABNORMAL HIGH (ref 70–99)
Glucose-Capillary: 151 mg/dL — ABNORMAL HIGH (ref 70–99)
Glucose-Capillary: 147 mg/dL — ABNORMAL HIGH (ref 70–99)
Glucose-Capillary: 158 mg/dL — ABNORMAL HIGH (ref 70–99)
Glucose-Capillary: 158 mg/dL — ABNORMAL HIGH (ref 70–99)

## 2019-05-26 MED ORDER — FUROSEMIDE 10 MG/ML IJ SOLN
40.0000 mg | Freq: Once | INTRAMUSCULAR | Status: AC
  Administered 2019-05-26: 40 mg via INTRAVENOUS
  Filled 2019-05-26: qty 4

## 2019-05-26 MED ORDER — METOPROLOL TARTRATE 5 MG/5ML IV SOLN
5.0000 mg | Freq: Once | INTRAVENOUS | Status: AC
  Administered 2019-05-26: 5 mg via INTRAVENOUS
  Filled 2019-05-26: qty 5

## 2019-05-26 MED ORDER — FUROSEMIDE 10 MG/ML IJ SOLN
60.0000 mg | Freq: Once | INTRAMUSCULAR | Status: AC
  Administered 2019-05-26: 23:00:00 60 mg via INTRAVENOUS
  Filled 2019-05-26: qty 6

## 2019-05-26 MED ORDER — LEVALBUTEROL HCL 0.63 MG/3ML IN NEBU
0.6300 mg | INHALATION_SOLUTION | Freq: Four times a day (QID) | RESPIRATORY_TRACT | Status: DC | PRN
  Administered 2019-05-26 – 2019-05-27 (×2): 0.63 mg via RESPIRATORY_TRACT
  Filled 2019-05-26 (×3): qty 3

## 2019-05-26 MED ORDER — QUETIAPINE FUMARATE 25 MG PO TABS: 12.5000 mg | ORAL_TABLET | Freq: Two times a day (BID) | ORAL | Status: DC

## 2019-05-26 NOTE — Plan of Care (Signed)
  Problem: Health Behavior/Discharge Planning: Goal: Ability to manage health-related needs will improve Outcome: Not Progressing   Problem: Clinical Measurements: Goal: Will remain free from infection Outcome: Not Progressing

## 2019-05-26 NOTE — Progress Notes (Signed)
[name]  PROGRESS NOTE    SOTIRIA KEAST  NGE:[id number] DOB: May 25, 1941 DOA: 05/22/2019 PCP: [name], MD   Brief Narrative:   CLIMMIE CRONCE is a 78 y.o. female with medical history significant of DM2, HTN.  Patient presents to the ED with c/o epigastric abd pain, N/V.  Symptoms onset suddenly 3 hours ago, symptoms are severe, nothing makes better or worse, pain located in epigastrium.   Assessment & Plan: Acute pancreatitis     - IVF: 2L bolus in ED and We will stop the IV fluids now.     - Continue zosyn     - NPO except meds     - zofran PRN nausea     - RUQ [place] w/o evidence of acute chole, however, CBD is 73mm; spoke GI, order MRCP if stone detected, call them back     - Dilaudid PRN pain     - MRCP w/o stone in CBD, CBD is 68mm, gallbladder w/ sludge/stones, but no acute chole     - lipase is greatly improved     - WBC and temperature curve overall improved; continue current Antibiotics, will eventually require general surgery consultation for lap chole  A.Fib - (A.Flutter at the moment actually)     - Hold coumadin, convert to heparin gtt when INR subtheraputic; INR is still elevated     -Patient unable to take anything by mouth.  We will transition to IV Cardizem infusion as well as IV Lopressor as needed.  Add clonidine patch.  DM2     - Hold metformin     - Sensitive SSI Q4H  HTN     - hypotensive at presentation     - Hold losartan/HCTZ  Acute encephalopathy. Likely multifactorial. CT head unremarkable. We will get further work-up.  Check EEG. Discussed with son at her baseline the patient does have some issues with memory but this current presentation is more different than her baseline. ABG negative for hypercarbia. EEG negative for any active seizures or shows metabolic encephalopathy. We will try Seroquel. QTC is not prolonged for now. If no improvement with addition of QTC we will consult neurology. UDS only positive for opiates.  Patient was  recently here in the hospital.  DVT prophylaxis: SCD Code Status: FULL   Disposition Plan: TBD   Antimicrobials:   Vanc, zosyn    Subjective: Continues to remain significantly confused.  No nausea no vomiting no fever no chills.  Objective: Vitals:   05/26/19 1700 05/26/19 1800 05/26/19 1902 05/26/19 1944  BP: (!) 154/92 (!) 154/84 (!) 112/93 (!) 152/103  Pulse: (!) 106 86 (!) 121 (!) 110  Resp: (!) 26 (!) 31 (!) 32 (!) 33  Temp: 98.7 F (37.1 C)   98.2 F (36.8 C)  TempSrc: Oral   Oral  SpO2: 96% 94% 93% 95%  Weight:      Height:        Intake/Output Summary (Last 24 hours) at 05/26/2019 2006 Last data filed at 05/26/2019 1900 Gross per 24 hour  Intake 2430.62 ml  Output 1650 ml  Net 780.62 ml   Filed Weights   05/22/19 2216 05/23/19 0355  Weight: 99.8 kg 92.9 kg    Examination: General: lethargic and not oriented to time, place, and person. Appear in marked distress, affect emotionally labile Eyes: PERRL, Conjunctiva normal ENT: Oral Mucosa Clear, dry  Neck: NO JVD, NO Abnormal Mass Or lumps Cardiovascular: S1 and S2 Present, NO Murmur, peripheral pulses symmetrical Respiratory:  normal respiratory effort, Bilateral Air entry equal and Decreased, NO use of accessory muscle, Clear to Auscultation, NO Crackles, NO wheezes Abdomen: Bowel Sound PREsent, Soft and difuse tenderness, no hernia Skin: no rashes  Extremities: no Pedal edema, no calf tenderness Neurologic: PERLA, Motor strength 5/5 and symmetric and sensation grossly normal Gait not checked due to patient safety concerns   Data Reviewed: I have personally reviewed following labs and imaging studies.  CBC: Recent Labs  Lab 05/22/19 2323 05/22/19 2338 05/23/19 0441 05/24/19 0634 05/25/19 0435 05/26/19 0814  WBC 26.7*  --  13.6* 19.2* 15.0* 17.0*  NEUTROABS  --   --   --  16.1* 13.5*  --   HGB 15.4* 16.0* 16.6* 16.2* 14.2 13.4  HCT 46.3* 47.0* 49.5* 48.0* 42.8 39.6  MCV 92.0  --  89.5 89.2 90.3  89.4  PLT 246  --  263 220 194 381   Basic Metabolic Panel: Recent Labs  Lab 05/22/19 2323 05/22/19 2338 05/23/19 0441 05/24/19 0634 05/25/19 0435 05/26/19 0814  NA 138 137 138 140 141 145  K 3.3* 3.2* 3.3* 4.3 3.6 3.0*  CL 102  --  104 106 109 113*  CO2 23  --  20* 22 21* 22  GLUCOSE 240*  --  236* 191* 181* 188*  BUN 22  --  18 31* 32* 25*  CREATININE 1.30* 1.20* 1.24* 1.66* 1.35* 1.11*  CALCIUM 9.2  --  8.2* 8.4* 8.0* 8.0*  MG  --   --   --   --  1.7  --    GFR: Estimated Creatinine Clearance: 46.9 mL/min (A) (by C-G formula based on SCr of 1.11 mg/dL (H)). Liver Function Tests: Recent Labs  Lab 05/22/19 2323 05/23/19 0441 05/24/19 0634 05/25/19 0435 05/26/19 0814  AST 38 31 45* 25 22  ALT 21 22 45* 29 24  ALKPHOS 64 42 38 43 63  BILITOT 0.8 1.1 1.4* 1.0 1.1  PROT 6.5 6.2* 5.6* 5.6* 5.6*  ALBUMIN 4.2 3.7 2.9* 2.4* 2.4*   Recent Labs  Lab 05/22/19 2323 05/24/19 0634 05/25/19 1428  LIPASE 5,920* 373* 91*   Recent Labs  Lab 05/25/19 1428  AMMONIA 27   Coagulation Profile: Recent Labs  Lab 05/22/19 2323 05/23/19 0441 05/24/19 0422 05/25/19 0435 05/26/19 0232  INR 2.1* 2.2* 4.1* 3.8* 4.0*   Cardiac Enzymes: Recent Labs  Lab 05/22/19 2323  TROPONINI <0.03   BNP (last 3 results) No results for input(s): PROBNP in the last 8760 hours. HbA1C: No results for input(s): HGBA1C in the last 72 hours. CBG: Recent Labs  Lab 05/25/19 2358 05/26/19 0409 05/26/19 0823 05/26/19 1206 05/26/19 1730  GLUCAP 178* 189* 175* 151* 147*   Lipid Profile: No results for input(s): CHOL, HDL, LDLCALC, TRIG, CHOLHDL, LDLDIRECT in the last 72 hours. Thyroid Function Tests: Recent Labs    05/25/19 1428  TSH 0.908  FREET4 0.96   Anemia Panel: Recent Labs    05/25/19 1428  VITAMINB12 3,941*  FOLATE 9.0   Sepsis Labs: Recent Labs  Lab 05/23/19 0441 05/23/19 1514 05/24/19 0634 05/25/19 1428  LATICACIDVEN 2.8* 3.7* 2.8* 2.0*    Recent Results  (from the past 240 hour(s))  SARS Coronavirus 2 (CEPHEID - Performed in [place] hospital lab), Hosp Order     Status: None   Collection Time: 05/23/19 12:34 AM  Result Value Ref Range Status   SARS Coronavirus 2 NEGATIVE NEGATIVE Final    Comment: (NOTE) If result is NEGATIVE SARS-CoV-2 target nucleic acids  are NOT DETECTED. The SARS-CoV-2 RNA is generally detectable in upper and lower  respiratory specimens during the acute phase of infection. The lowest  concentration of SARS-CoV-2 viral copies this assay can detect is 250  copies / mL. A negative result does not preclude SARS-CoV-2 infection  and should not be used as the sole basis for treatment or other  patient management decisions.  A negative result may occur with  improper specimen collection / handling, submission of specimen other  than nasopharyngeal swab, presence of viral mutation(s) within the  areas targeted by this assay, and inadequate number of viral copies  (<250 copies / mL). A negative result must be combined with clinical  observations, patient history, and epidemiological information. If result is POSITIVE SARS-CoV-2 target nucleic acids are DETECTED. The SARS-CoV-2 RNA is generally detectable in upper and lower  respiratory specimens dur ing the acute phase of infection.  Positive  results are indicative of active infection with SARS-CoV-2.  Clinical  correlation with patient history and other diagnostic information is  necessary to determine patient infection status.  Positive results do  not rule out bacterial infection or co-infection with other viruses. If result is PRESUMPTIVE POSTIVE SARS-CoV-2 nucleic acids MAY BE PRESENT.   A presumptive positive result was obtained on the submitted specimen  and confirmed on repeat testing.  While 2019 novel coronavirus  (SARS-CoV-2) nucleic acids may be present in the submitted sample  additional confirmatory testing may be necessary for epidemiological  and /  or clinical management purposes  to differentiate between  SARS-CoV-2 and other Sarbecovirus currently known to infect humans.  If clinically indicated additional testing with an alternate test  methodology [phone]) is advised. The SARS-CoV-2 RNA is generally  detectable in upper and lower respiratory sp ecimens during the acute  phase of infection. The expected result is Negative. Fact Sheet for Patients:  StrictlyIdeas.no Fact Sheet for Healthcare Providers: BankingDealers.co.za This test is not yet approved or cleared by the [place] FDA and has been authorized for detection and/or diagnosis of SARS-CoV-2 by FDA under an Emergency Use Authorization (EUA).  This EUA will remain in effect (meaning this test can be used) for the duration of the COVID-19 declaration under Section 564(b)(1) of the Act, 21 U.S.C. section 360bbb-3(b)(1), unless the authorization is terminated or revoked sooner. Performed at [facility], [place] [address], [place], [place] [address]          Radiology Studies: Ct Head Wo Contrast  Result Date: 05/25/2019 CLINICAL DATA:  Altered mental status. EXAM: CT HEAD WITHOUT CONTRAST TECHNIQUE: Contiguous axial images were obtained from the base of the skull through the vertex without intravenous contrast. COMPARISON:  12/01/2012. FINDINGS: Brain: No evidence of acute infarction, hemorrhage, hydrocephalus, extra-axial collection or mass lesion/mass effect. Generalized atrophy. Chronic microvascular ischemic change. Vascular: Calcification of the cavernous internal carotid arteries consistent with cerebrovascular atherosclerotic disease. No signs of intracranial large vessel occlusion. Skull: Calvarium intact. Sinuses/Orbits: No acute finding.  Chronic sphenoid sinus disease. Other: None IMPRESSION: Atrophy and small vessel disease. No acute intracranial findings. Similar appearance to priors. Electronically Signed    By: Staci Righter M.D.   On: 05/25/2019 14:12   Dg Chest Port 1 View  Result Date: 05/25/2019 CLINICAL DATA:  Shortness of breath EXAM: PORTABLE CHEST 1 VIEW COMPARISON:  05/22/2019 FINDINGS: The heart size is enlarged. There are new small bilateral pleural effusions which have increased in size from the prior study. There is no pneumothorax. Bibasilar airspace opacities  are noted. There is no acute osseous abnormality. There is generalized volume overload. IMPRESSION: Developing small bilateral pleural effusions with worsening volume overload. Electronically Signed   By: Constance Holster M.D.   On: 05/25/2019 21:41        Scheduled Meds:  cloNIDine  0.1 mg Transdermal Weekly   insulin aspart  0-9 Units Subcutaneous Q4H   QUEtiapine  12.5 mg Oral BID   sodium chloride flush  10-40 mL Intracatheter Q12H   Continuous Infusions:  diltiazem (CARDIZEM) infusion 15 mg/hr (05/26/19 1741)   piperacillin-tazobactam (ZOSYN)  IV 3.375 g (05/26/19 1303)     LOS: 3 days    Time spent: 35 minutes spent in the coordination of care.   Author:  Berle Mull, MD [facility] 05/26/2019   To reach On-call, see care teams to locate the attending and reach out to them via [email or web address]. If 7PM-7AM, please contact night-coverage If you still have difficulty reaching the attending provider, please page the [facility] (Director on Call) for [facility] on amion for assistance.

## 2019-05-26 NOTE — Progress Notes (Signed)
Pharmacy Antibiotic Note  Melanie [name] is a 78 y.o. female admitted on 05/22/2019 with intra-abdominal infection.  Pharmacy has been consulted for Zosyn dosing. Day# Afebrile, Tmax 99.3, WBC 17 SCr down to 1.11,  CrCl ~ 46 ml/min  Antimicrobials this admission: Zosyn 6/1>>  Microbiology: 6/1 covid: neg  Plan: Continue Zosyn 3.375g IV q8h (4-hour infusion). Monitor clinical status, renal function and culture results daily.    Height: 5\' 4"  (162.6 cm) Weight: 204 lb 12.9 oz (92.9 kg) IBW/kg (Calculated) : 54.7  Temp (24hrs), Avg:98.3 F (36.8 C), Min:97.3 F (36.3 C), Max:99.3 F (37.4 C)  Recent Labs  Lab 05/22/19 2323 05/22/19 2338 05/23/19 0441 05/23/19 1514 05/24/19 0634 05/25/19 0435 05/25/19 1428 05/26/19 0814  WBC 26.7*  --  13.6*  --  19.2* 15.0*  --  17.0*  CREATININE 1.30* 1.20* 1.24*  --  1.66* 1.35*  --  1.11*  LATICACIDVEN 2.3*  --  2.8* 3.7* 2.8*  --  2.0*  --     Estimated Creatinine Clearance: 46.9 mL/min (A) (by C-G formula based on SCr of 1.11 mg/dL (H)).    Allergies  Allergen Reactions  . Niacin And Related Rash  . Keflex [Cephalexin] Other (See Comments)    Fatigue, sore throat, body aches  . Sausage [Pickled Meat] Other (See Comments)    Drowsiness and dizziness  . Tape Rash    Bandaids     Thank you for allowing pharmacy to be a part of this patient's care.  Wynona Neat, PharmD, BCPS  05/26/2019 12:08 PM

## 2019-05-26 NOTE — Progress Notes (Addendum)
Upon shift assessment, patient had increased SOB, coughing (non-productive). HR 150's; B/P 524-799'Q systolic. Lungs sounded wet. Called rapid response and was told that this patient has been like this all day. Increased confusion (different from admission).   Called respiratory to assess this patient. ABG drawn per respiratory. See results. Called on call triad. Metoprolol 5 mg once given per order; chest xray completed. See results. Fluids reduced to 50 ml/hr d/t results of xray, one time dose of 40 mg lasixs given.

## 2019-05-26 NOTE — Progress Notes (Signed)
Patient with increased chest congestion, MD made aware. Noted orders for lasix and breathing meds. IVF dc'd per order. I will continue to monitor

## 2019-05-26 NOTE — Progress Notes (Signed)
RT Note:  RT called to patient room.  Patient Tachypneic in upper 30s.  Patient unable to complete a sentence.   Patient HR elevated (which has been occurring).  Spoke with Dr. Silas [place] concerning patient.  Explained that is was too soon for treatment and BS were Coarse crackles, sounded like fluid gathering.  Doctor ordered CXR, lasix, and pain meds.  Will continue to monitor.

## 2019-05-26 NOTE — Progress Notes (Signed)
EEG completed, results pending. 

## 2019-05-26 NOTE — Care Management Important Message (Signed)
Important Message  Patient Details  Name: Melanie [name] MRN: [id number] Date of Birth: 1941-12-02   Medicare Important Message Given:  Yes    Orbie Pyo 05/26/2019, 2:12 PM

## 2019-05-26 NOTE — Progress Notes (Signed)
Patient calling out "help me" to God. Also asking the devil to leave her alone. Patient was unable to give this RN any orientation. Patient kept saying "Amen". HR came down to 110's -120's. B/P also came down. cardizem at 15 during this time.   This RN was able to reduce the cardizem to 10. Patient was oriented to self.    Cleaned the patient up and patient kept asking if she was dead. Told the patient that she was in the hospital and breathing. Patient questioned this RN if she was really breathing. Patient was reassured several times that she was not dead and she was breathing.   Patient asked if she was really in the hospital because it looked like a mortuary to her. Reassured patient again that she was in the hospital.

## 2019-05-26 NOTE — Procedures (Signed)
ELECTROENCEPHALOGRAM REPORT   Patient: Melanie [name]       Room #: 4E01C EEG No. ID: [id number] Age: 78 y.o.        Sex: female Referring Physician: Posey Pronto Report Date:  05/26/2019        Interpreting Physician: Alexis Goodell  History: Melanie [name] is an 78 y.o. female with acute encephalopathy  Medications:  Clonidine, Insulin, Zosyn, Cardizem  Conditions of Recording:  This is a 21 channel routine scalp EEG performed with bipolar and monopolar montages arranged in accordance to the international 10/20 system of electrode placement. One channel was dedicated to EKG recording.  The patient is in the altered state.  Description:  The background activity is slow and poorly organized. It consists of a low voltage, mixture of delta and theta activity that is diffusely distributed.  There are intermittent, periodic discharges of triphasic morphology.  Some rare, short periods of relative attenuation are noted as well.  Hyperventilation and intermittent photic stimulation were not performed.   IMPRESSION: This is an abnormal electroencephalogram due to the presence of general background slowing and triphasic waves.  This is seen most commonly in encephalopathic states, most commonly, but not limited to, metabolic encephalopathies.    Alexis Goodell, MD Neurology [phone] 05/26/2019, 1:12 PM

## 2019-05-26 NOTE — Progress Notes (Signed)
Upon assessment, patient's breathing still labored and congested. Rhonchi bilateral upper lobes, crackles in bases. Patient still saying that she is dying. Will continue to monitor

## 2019-05-26 NOTE — Progress Notes (Signed)
Patient's HR elevated to 140's-150's. Increased cardizem to 15. Patient is continually counting. Asked the patient why she was counting, patient stated that,"she did not know".  Will continue to monitor

## 2019-05-26 NOTE — Progress Notes (Signed)
ANTICOAGULATION CONSULT NOTE - -follow up  Pharmacy Consult for heparin Indication: atrial fibrillation  Allergies  Allergen Reactions  . Niacin And Related Rash  . Keflex [Cephalexin] Other (See Comments)    Fatigue, sore throat, body aches  . Sausage [Pickled Meat] Other (See Comments)    Drowsiness and dizziness  . Tape Rash    Bandaids     Patient Measurements: Height: 5\' 4"  (162.6 cm) Weight: 204 lb 12.9 oz (92.9 kg) IBW/kg (Calculated) : 54.7 Heparin Dosing Weight: 75kg  Vital Signs: Temp: 97.3 F (36.3 C) (06/04 0411) Temp Source: Axillary (06/04 0411) BP: 125/63 (06/04 0500) Pulse Rate: 85 (06/04 0500)  Labs: Recent Labs    05/24/19 0422 05/24/19 0634 05/25/19 0435 05/26/19 0232  HGB  --  16.2* 14.2  --   HCT  --  48.0* 42.8  --   PLT  --  220 194  --   LABPROT 39.3*  --  37.2* 38.3*  INR 4.1*  --  3.8* 4.0*  CREATININE  --  1.66* 1.35*  --     Estimated Creatinine Clearance: 38.6 mL/min (A) (by C-G formula based on SCr of 1.35 mg/dL (H)).   Medical History: Past Medical History:  Diagnosis Date  . Allergic rhinitis   . Arthritis   . Diabetes mellitus   . GERD (gastroesophageal reflux disease)   . Hyperlipidemia   . Hypertension   . Pain in joint, ankle and foot 09/14/2013    Medications:  Medications Prior to Admission  Medication Sig Dispense Refill Last Dose  . Ascorbic Acid (VITAMIN C) 1000 MG tablet Take 1,000 mg by mouth 2 (two) times daily.   Taking  . Calcium Carb-Cholecalciferol (CALTRATE 600+D3 SOFT PO) Take 1 tablet by mouth 2 (two) times daily.   Taking  . CRANBERRY PO Take 2 tablets by mouth daily.   Taking  . Cyanocobalamin (VITAMIN B-12) 1000 MCG SUBL Place 1 each under the tongue daily.   Taking  . diltiazem (CARDIZEM) 90 MG tablet TAKE 1 TABLET (90 MG TOTAL) BY MOUTH 2 (TWO) TIMES DAILY. 180 tablet 1   . fish oil-omega-3 fatty acids 1000 MG capsule Take 2 g by mouth daily.   Taking  . fluticasone (FLONASE) 50 MCG/ACT nasal  spray Place 2 sprays into both nostrils daily as needed for allergies or rhinitis.   Taking  . furosemide (LASIX) 20 MG tablet TAKE 1 TABLET BY MOUTH EVERY DAY (Patient taking differently: Take 20 mg by mouth daily. ) 90 tablet 1 Taking  . hydrocortisone 2.5 % cream APPLY TO THE FACE TWICE A DAY AS NEEDED 1 WEEK ON THEN 1 WEEK OFF  0 Taking  . lisinopril-hydrochlorothiazide (PRINZIDE,ZESTORETIC) 20-12.5 MG tablet TAKE 1 TABLET BY MOUTH EVERY DAY (Patient taking differently: Take 1 tablet by mouth daily. ) 90 tablet 1 Taking  . loperamide (IMODIUM A-D) 2 MG capsule Take by mouth as needed for diarrhea or loose stools.   Taking  . metFORMIN (GLUCOPHAGE) 500 MG tablet TAKE 1 TABLET (500 MG TOTAL) BY MOUTH 2 (TWO) TIMES DAILY WITH A MEAL. 180 tablet 1 Taking  . metoprolol tartrate (LOPRESSOR) 25 MG tablet Take 1.5 tablets (37.5 mg total) by mouth 2 (two) times daily. (Patient taking differently: Take 25 mg by mouth 2 (two) times daily. ) 180 tablet 1 Taking  . omeprazole (PRILOSEC) 20 MG capsule TAKE 1 CAPSULE BY MOUTH EVERY DAY (Patient taking differently: Take 20 mg by mouth daily. ) 90 capsule 1   . pravastatin (  PRAVACHOL) 40 MG tablet Take 1 tablet (40 mg total) by mouth daily. 90 tablet 3 Taking  . Probiotic Product (PROBIOTIC PO) Take 1 capsule by mouth daily.    Taking  . sulfamethoxazole-trimethoprim (BACTRIM DS) 800-160 MG tablet Take 1 tablet by mouth 2 (two) times daily. 10 tablet 0   . vitamin E 100 UNIT capsule Take by mouth.   Taking  . warfarin (COUMADIN) 5 MG tablet TAKE AS DIRECTED BY COUMADIN CLINIC (Patient taking differently: Take 5-7.5 mg by mouth See admin instructions. Take 1 1/2 tablet (7.5 mg) by mouth on Monday, Wednesday, Friday, take 1 tablet (5 mg) on Sunday, Tuesday, Thursday, Saturday - or as directed by Coumadin Clinic) 120 tablet 0    Scheduled:  . cloNIDine  0.1 mg Transdermal Weekly  . insulin aspart  0-9 Units Subcutaneous Q4H  . sodium chloride flush  10-40 mL  Intracatheter Q12H   Infusions:  . sodium chloride 50 mL/hr at 05/25/19 2347  . diltiazem (CARDIZEM) infusion 15 mg/hr (05/26/19 0515)  . piperacillin-tazobactam (ZOSYN)  IV 3.375 g (05/26/19 0500)    Assessment: 78yo female c/o N/V, admitted for acute pancreatitis, to transition from Coumadin to heparin while NPO; current INR 4.0.  Scr increased to 1.6>1.35.  AST/ALT increased to 45/45.  H/H wnl/stable on 6/3. No bleeding noted.  Last dose of coumadin was PTA, however unknown date as we have been unable to confirm medication history for this patient.  The  INR remains supratherapeutic off coumadin, thus no need to start IV heparin at this time.    Goal of Therapy:  Heparin level 0.3-0.7 units/ml Monitor platelets by anticoagulation protocol: Yes   Plan:  Contiune to monitor INR daily and will begin IV heparin when INR <2.  Nicole Cella, RPh Clinical Pharmacist [phone] Please check AMION for all [place] phone numbers After 10:00 PM, call [place] [phone] 05/26/2019,8:12 AM

## 2019-05-27 ENCOUNTER — Inpatient Hospital Stay (HOSPITAL_COMMUNITY): Payer: Medicare HMO

## 2019-05-27 ENCOUNTER — Encounter: Payer: Medicare HMO | Admitting: Family Medicine

## 2019-05-27 DIAGNOSIS — I5030 Unspecified diastolic (congestive) heart failure: Secondary | ICD-10-CM

## 2019-05-27 LAB — ECHOCARDIOGRAM COMPLETE
Height: 64 in
Weight: 3276.92 oz

## 2019-05-27 LAB — PROTIME-INR
INR: 3.8 — ABNORMAL HIGH (ref 0.8–1.2)
Prothrombin Time: 36.8 seconds — ABNORMAL HIGH (ref 11.4–15.2)

## 2019-05-27 LAB — CBC WITH DIFFERENTIAL/PLATELET
Abs Immature Granulocytes: 0.21 10*3/uL — ABNORMAL HIGH (ref 0.00–0.07)
Basophils Absolute: 0.1 10*3/uL (ref 0.0–0.1)
Basophils Relative: 0 %
Eosinophils Absolute: 0 10*3/uL (ref 0.0–0.5)
Eosinophils Relative: 0 %
HCT: 44.6 % (ref 36.0–46.0)
Hemoglobin: 15.1 g/dL — ABNORMAL HIGH (ref 12.0–15.0)
Immature Granulocytes: 1 %
Lymphocytes Relative: 5 %
Lymphs Abs: 0.9 10*3/uL (ref 0.7–4.0)
MCH: 30.2 pg (ref 26.0–34.0)
MCHC: 33.9 g/dL (ref 30.0–36.0)
MCV: 89.2 fL (ref 80.0–100.0)
Monocytes Absolute: 0.8 10*3/uL (ref 0.1–1.0)
Monocytes Relative: 5 %
Neutro Abs: 15.4 10*3/uL — ABNORMAL HIGH (ref 1.7–7.7)
Neutrophils Relative %: 89 %
Platelets: 236 10*3/uL (ref 150–400)
RBC: 5 MIL/uL (ref 3.87–5.11)
RDW: 14.3 % (ref 11.5–15.5)
WBC: 17.5 10*3/uL — ABNORMAL HIGH (ref 4.0–10.5)
nRBC: 0.2 % (ref 0.0–0.2)

## 2019-05-27 LAB — COMPREHENSIVE METABOLIC PANEL
ALT: 23 U/L (ref 0–44)
AST: 17 U/L (ref 15–41)
Albumin: 2.5 g/dL — ABNORMAL LOW (ref 3.5–5.0)
Alkaline Phosphatase: 72 U/L (ref 38–126)
Anion gap: 15 (ref 5–15)
BUN: 27 mg/dL — ABNORMAL HIGH (ref 8–23)
CO2: 21 mmol/L — ABNORMAL LOW (ref 22–32)
Calcium: 8 mg/dL — ABNORMAL LOW (ref 8.9–10.3)
Chloride: 112 mmol/L — ABNORMAL HIGH (ref 98–111)
Creatinine, Ser: 1.17 mg/dL — ABNORMAL HIGH (ref 0.44–1.00)
GFR calc Af Amer: 52 mL/min — ABNORMAL LOW (ref >60)
GFR calc non Af Amer: 45 mL/min — ABNORMAL LOW (ref >60)
Glucose, Bld: 200 mg/dL — ABNORMAL HIGH (ref 70–99)
Potassium: 2.9 mmol/L — ABNORMAL LOW (ref 3.5–5.1)
Sodium: 148 mmol/L — ABNORMAL HIGH (ref 135–145)
Total Bilirubin: 1.3 mg/dL — ABNORMAL HIGH (ref 0.3–1.2)
Total Protein: 6 g/dL — ABNORMAL LOW (ref 6.5–8.1)

## 2019-05-27 LAB — GLUCOSE, CAPILLARY
Glucose-Capillary: 170 mg/dL — ABNORMAL HIGH (ref 70–99)
Glucose-Capillary: 204 mg/dL — ABNORMAL HIGH (ref 70–99)
Glucose-Capillary: 192 mg/dL — ABNORMAL HIGH (ref 70–99)
Glucose-Capillary: 186 mg/dL — ABNORMAL HIGH (ref 70–99)
Glucose-Capillary: 181 mg/dL — ABNORMAL HIGH (ref 70–99)

## 2019-05-27 LAB — MAGNESIUM: Magnesium: 1.8 mg/dL (ref 1.7–2.4)

## 2019-05-27 MED ORDER — FUROSEMIDE 10 MG/ML IJ SOLN
40.0000 mg | Freq: Once | INTRAMUSCULAR | Status: AC
  Administered 2019-05-27: 40 mg via INTRAVENOUS
  Filled 2019-05-27: qty 4

## 2019-05-27 MED ORDER — LEVALBUTEROL HCL 0.63 MG/3ML IN NEBU
0.6300 mg | INHALATION_SOLUTION | Freq: Four times a day (QID) | RESPIRATORY_TRACT | Status: DC
  Administered 2019-05-27 – 2019-05-31 (×16): 0.63 mg via RESPIRATORY_TRACT
  Filled 2019-05-27 (×17): qty 3

## 2019-05-27 MED ORDER — FUROSEMIDE 10 MG/ML IJ SOLN
80.0000 mg | Freq: Two times a day (BID) | INTRAMUSCULAR | Status: DC
  Administered 2019-05-27: 20:00:00 80 mg via INTRAVENOUS
  Filled 2019-05-27: qty 8

## 2019-05-27 MED ORDER — FUROSEMIDE 10 MG/ML IJ SOLN: 80.0000 mg | Freq: Two times a day (BID) | INTRAMUSCULAR | Status: DC

## 2019-05-27 MED ORDER — HYDROMORPHONE HCL 1 MG/ML IJ SOLN
0.5000 mg | INTRAMUSCULAR | Status: DC | PRN
  Administered 2019-05-27 – 2019-06-07 (×35): 0.5 mg via INTRAVENOUS
  Filled 2019-05-27 (×38): qty 1

## 2019-05-27 MED ORDER — FUROSEMIDE 10 MG/ML IJ SOLN
40.0000 mg | Freq: Every day | INTRAMUSCULAR | Status: DC
  Administered 2019-05-27: 08:00:00 40 mg via INTRAVENOUS
  Filled 2019-05-27: qty 4

## 2019-05-27 MED ORDER — POTASSIUM CHLORIDE 10 MEQ/100ML IV SOLN
10.0000 meq | INTRAVENOUS | Status: AC
  Administered 2019-05-27 (×5): 10 meq via INTRAVENOUS
  Filled 2019-05-27 (×5): qty 100

## 2019-05-27 MED ORDER — SODIUM CHLORIDE 0.9 % IV SOLN
3.0000 g | Freq: Three times a day (TID) | INTRAVENOUS | Status: DC
  Administered 2019-05-27 – 2019-05-28 (×3): 3 g via INTRAVENOUS
  Filled 2019-05-27 (×5): qty 3

## 2019-05-27 MED ORDER — METHYLPREDNISOLONE SODIUM SUCC 40 MG IJ SOLR
40.0000 mg | Freq: Four times a day (QID) | INTRAMUSCULAR | Status: DC
  Administered 2019-05-27 – 2019-05-29 (×7): 40 mg via INTRAVENOUS
  Filled 2019-05-27 (×8): qty 1

## 2019-05-27 MED ORDER — METOPROLOL TARTRATE 5 MG/5ML IV SOLN
5.0000 mg | Freq: Four times a day (QID) | INTRAVENOUS | Status: DC
  Administered 2019-05-27 (×2): 5 mg via INTRAVENOUS
  Filled 2019-05-27: qty 5

## 2019-05-27 NOTE — Progress Notes (Addendum)
Called son Elivia Robotham and updated on plan of care for patient.   Addedum: Patient was also able to talk to son via room phone. Told son that she was going to die.

## 2019-05-27 NOTE — Progress Notes (Signed)
Patient increased SOB, tachypneic, struggles when talking. Patient barely able to get her name out when asked. Patient could not tell this RN where she was at or her birth date.   Called respiratory to assess patient. Unable to give patient breathing d/t time frame. Breathing treatment at [place] did not give any relief.   On call notified, CXR ordered stat and completed. See results. 60 mg lasixs given.  Patient breathing somewhat better but is not as verbal as she was on 6/4 pm. Lungs still congested. Will continue to monitor

## 2019-05-27 NOTE — Plan of Care (Signed)
  Problem: Clinical Measurements: Goal: Will remain free from infection Outcome: Progressing   Problem: Education: Goal: Knowledge of General Education information will improve Description Including pain rating scale, medication(s)/side effects and non-pharmacologic comfort measures Outcome: Not Progressing   Problem: Health Behavior/Discharge Planning: Goal: Ability to manage health-related needs will improve Outcome: Not Progressing   Problem: Clinical Measurements: Goal: Ability to maintain clinical measurements within normal limits will improve Outcome: Not Progressing Goal: Respiratory complications will improve Outcome: Not Progressing Goal: Cardiovascular complication will be avoided Outcome: Not Progressing   Problem: Nutrition: Goal: Adequate nutrition will be maintained Outcome: Not Progressing

## 2019-05-27 NOTE — Significant Event (Signed)
Rapid Response Event Note  I just came by to see how Melanie [name] ("Melanie [name]") was doing. Patient was resting, she woke to verbal response, I asked her how was feeling, she mumbled and responded that she wasn't sure how she feels. Patient has been confused/altered the past few days, ABG/EEG/CTH have be done as well. HR was in the 100-130s AF, RN was in the room as well, we increased the Diltiazem infusion to 15 mg/hr from 10. Stable BPs. 92-94% on 2L, ongoing tachypnea 24-30 at times, not in acute distress. Lung sounds - coarse crackles throughout, had an CXR last night that showed worsening volume overload (increasing BL pleural effusions), patient received Lasix 60 mg IV last night, but no UOP was documented ?  Plan:  -- RN to give scheduled Lasix IV  -- Strict I/O -- Delirium Precautions --lights on/blinds open/ tv on -- during the day.  -- Aspiration Precautions -- Encourage IS/CDB - might be a challenge given altered mental status/encephalopathy.  Cori Henningsen R

## 2019-05-27 NOTE — Progress Notes (Signed)
ANTICOAGULATION CONSULT NOTE - -follow up  Pharmacy Consult for heparin Indication: atrial fibrillation  Allergies  Allergen Reactions  . Niacin And Related Rash  . Keflex [Cephalexin] Other (See Comments)    Fatigue, sore throat, body aches  . Sausage [Pickled Meat] Other (See Comments)    Drowsiness and dizziness  . Tape Rash    Bandaids     Patient Measurements: Height: 5\' 4"  (162.6 cm) Weight: 204 lb 12.9 oz (92.9 kg) IBW/kg (Calculated) : 54.7 Heparin Dosing Weight: 75kg  Vital Signs: Temp: 98.3 F (36.8 C) (06/05 0822) Temp Source: Axillary (06/05 0822) BP: 142/85 (06/05 0822) Pulse Rate: 97 (06/05 0822)  Labs: Recent Labs    05/25/19 0435 05/26/19 0232 05/26/19 0814 05/27/19 0250  HGB 14.2  --  13.4  --   HCT 42.8  --  39.6  --   PLT 194  --  210  --   LABPROT 37.2* 38.3*  --  36.8*  INR 3.8* 4.0*  --  3.8*  CREATININE 1.35*  --  1.11*  --     Estimated Creatinine Clearance: 46.9 mL/min (A) (by C-G formula based on SCr of 1.11 mg/dL (H)).   Medical History: Past Medical History:  Diagnosis Date  . Allergic rhinitis   . Arthritis   . Diabetes mellitus   . GERD (gastroesophageal reflux disease)   . Hyperlipidemia   . Hypertension   . Pain in joint, ankle and foot 09/14/2013    Medications:  Medications Prior to Admission  Medication Sig Dispense Refill Last Dose  . Ascorbic Acid (VITAMIN C) 1000 MG tablet Take 1,000 mg by mouth 2 (two) times daily.   Taking  . Calcium Carb-Cholecalciferol (CALTRATE 600+D3 SOFT PO) Take 1 tablet by mouth 2 (two) times daily.   Taking  . CRANBERRY PO Take 2 tablets by mouth daily.   Taking  . Cyanocobalamin (VITAMIN B-12) 1000 MCG SUBL Place 1 each under the tongue daily.   Taking  . diltiazem (CARDIZEM) 90 MG tablet TAKE 1 TABLET (90 MG TOTAL) BY MOUTH 2 (TWO) TIMES DAILY. 180 tablet 1   . fish oil-omega-3 fatty acids 1000 MG capsule Take 2 g by mouth daily.   Taking  . fluticasone (FLONASE) 50 MCG/ACT nasal  spray Place 2 sprays into both nostrils daily as needed for allergies or rhinitis.   Taking  . furosemide (LASIX) 20 MG tablet TAKE 1 TABLET BY MOUTH EVERY DAY (Patient taking differently: Take 20 mg by mouth daily. ) 90 tablet 1 Taking  . hydrocortisone 2.5 % cream APPLY TO THE FACE TWICE A DAY AS NEEDED 1 WEEK ON THEN 1 WEEK OFF  0 Taking  . lisinopril-hydrochlorothiazide (PRINZIDE,ZESTORETIC) 20-12.5 MG tablet TAKE 1 TABLET BY MOUTH EVERY DAY (Patient taking differently: Take 1 tablet by mouth daily. ) 90 tablet 1 Taking  . loperamide (IMODIUM A-D) 2 MG capsule Take by mouth as needed for diarrhea or loose stools.   Taking  . metFORMIN (GLUCOPHAGE) 500 MG tablet TAKE 1 TABLET (500 MG TOTAL) BY MOUTH 2 (TWO) TIMES DAILY WITH A MEAL. 180 tablet 1 Taking  . metoprolol tartrate (LOPRESSOR) 25 MG tablet Take 1.5 tablets (37.5 mg total) by mouth 2 (two) times daily. (Patient taking differently: Take 25 mg by mouth 2 (two) times daily. ) 180 tablet 1 Taking  . omeprazole (PRILOSEC) 20 MG capsule TAKE 1 CAPSULE BY MOUTH EVERY DAY (Patient taking differently: Take 20 mg by mouth daily. ) 90 capsule 1   . pravastatin (  PRAVACHOL) 40 MG tablet Take 1 tablet (40 mg total) by mouth daily. 90 tablet 3 Taking  . Probiotic Product (PROBIOTIC PO) Take 1 capsule by mouth daily.    Taking  . sulfamethoxazole-trimethoprim (BACTRIM DS) 800-160 MG tablet Take 1 tablet by mouth 2 (two) times daily. 10 tablet 0   . vitamin E 100 UNIT capsule Take by mouth.   Taking  . warfarin (COUMADIN) 5 MG tablet TAKE AS DIRECTED BY COUMADIN CLINIC (Patient taking differently: Take 5-7.5 mg by mouth See admin instructions. Take 1 1/2 tablet (7.5 mg) by mouth on Monday, Wednesday, Friday, take 1 tablet (5 mg) on Sunday, Tuesday, Thursday, Saturday - or as directed by Coumadin Clinic) 120 tablet 0    Scheduled:  . cloNIDine  0.1 mg Transdermal Weekly  . furosemide  40 mg Intravenous Daily  . insulin aspart  0-9 Units Subcutaneous Q4H   . metoprolol tartrate  5 mg Intravenous Q6H  . QUEtiapine  12.5 mg Oral BID  . sodium chloride flush  10-40 mL Intracatheter Q12H   Infusions:  . diltiazem (CARDIZEM) infusion 10 mg/hr (05/27/19 0322)  . piperacillin-tazobactam (ZOSYN)  IV 3.375 g (05/27/19 0504)    Assessment: 78yo female c/o N/V, admitted for acute pancreatitis, to transition from Coumadin to heparin while NPO; current INR 4.0.  -INR= 3.8, CBC stable  Goal of Therapy:  Heparin level 0.3-0.7 units/ml Monitor platelets by anticoagulation protocol: Yes   Plan:  Contiune to monitor INR daily and will begin IV heparin when INR <2.  Hildred Laser, PharmD Clinical Pharmacist **Pharmacist phone directory can now be found on [email or web address] ([email or web address]).  Listed under [place].

## 2019-05-27 NOTE — Progress Notes (Signed)
Placed patient on resmed BiPAP 16/6 7 L bleed in, per MD's order.

## 2019-05-27 NOTE — Progress Notes (Signed)
[name]  PROGRESS NOTE    Melanie [name]  WCB:[id number] DOB: 02-05-1941 DOA: 05/22/2019 PCP: [name], MD   Brief Narrative:   Melanie [name] is a 78 y.o. female with medical history significant of DM2, HTN.  Patient presents to the ED with c/o epigastric abd pain, N/V.  Symptoms onset suddenly 3 hours ago, symptoms are severe, nothing makes better or worse, pain located in epigastrium.   Assessment & Plan: Acute pancreatitis     - IVF: 2L bolus in ED and We will stop the IV fluids now.     - Continue zosyn     - NPO except meds     - zofran PRN nausea     - RUQ [place] w/o evidence of acute chole, however, CBD is 6mm; spoke GI, order MRCP if stone detected, call them back     - Dilaudid PRN pain     - MRCP w/o stone in CBD, CBD is 31mm, gallbladder w/ sludge/stones, but no acute chole     - lipase is greatly improved     - WBC and temperature curve overall improved; continue current Antibiotics, will eventually require general surgery consultation for lap chole  A.Fib - (A.Flutter at the moment actually)     - Hold coumadin, convert to heparin gtt when INR subtheraputic; INR is still elevated     -Patient unable to take anything by mouth.  We will transition to IV Cardizem infusion as well as IV Lopressor as needed.  Add clonidine patch.  Acute hypoxic respiratory failure. Acute on chronic diastolic CHF. Secondary to volume overload from IV fluids. We will continue with IV Lasix for now. BiPAP PRN. Monitor. Xopenex every 6 hours.  DM2     - Hold metformin     - Sensitive SSI Q4H  HTN     - hypotensive at presentation     - Hold losartan/HCTZ  Acute encephalopathy. Likely multifactorial. CT head unremarkable. We will get further work-up.  Check EEG. Discussed with son at her baseline the patient does have some issues with memory but this current presentation is more different than her baseline. ABG negative for hypercarbia. EEG negative for any active seizures  or shows metabolic encephalopathy. We will hold Seroquel. QTC is not prolonged for now. If no improvement with addition of QTC we will consult neurology. UDS only positive for opiates.  Patient was recently here in the hospital.  DVT prophylaxis: SCD Code Status: FULL   Disposition Plan: TBD   Antimicrobials:  Deniece Ree, zosyn    Subjective: Significant improvement in mentation but not back to baseline.  Patient is following commands.  Answering questions appropriately.  Later in the afternoon the patient becomes confused and becomes more respiratory distress.  No nausea or vomiting reports abdominal discomfort.  Objective: Vitals:   05/27/19 1735 05/27/19 1800 05/27/19 1831 05/27/19 1925  BP: 101/71 93/72 107/73 120/77  Pulse: 85 (!) 51 (!) 56 78  Resp: (!) 36 (!) 40 (!) 29 (!) 37  Temp:    98.6 F (37 C)  TempSrc:    Axillary  SpO2: 94% 93% 94% 94%  Weight:      Height:        Intake/Output Summary (Last 24 hours) at 05/27/2019 2103 Last data filed at 05/27/2019 1829 Gross per 24 hour  Intake 513.63 ml  Output 2025 ml  Net -1511.37 ml   Filed Weights   05/22/19 2216 05/23/19 0355  Weight: 99.8 kg 92.9 kg  Examination: General: lethargic and not oriented to time, place, and person. Appear in marked distress, affect emotionally labile Eyes: PERRL, Conjunctiva normal ENT: Oral Mucosa Clear, dry  Neck: NO JVD, NO Abnormal Mass Or lumps Cardiovascular: S1 and S2 Present, NO Murmur, peripheral pulses symmetrical Respiratory: normal respiratory effort, Bilateral Air entry equal and Decreased, NO use of accessory muscle, Clear to Auscultation, NO Crackles, NO wheezes Abdomen: Bowel Sound PREsent, Soft and difuse tenderness, no hernia Skin: no rashes  Extremities: no Pedal edema, no calf tenderness Neurologic: PERLA, Motor strength 5/5 and symmetric and sensation grossly normal Gait not checked due to patient safety concerns   Data Reviewed: I have personally reviewed  following labs and imaging studies.  CBC: Recent Labs  Lab 05/23/19 0441 05/24/19 0634 05/25/19 0435 05/26/19 0814 05/27/19 0911  WBC 13.6* 19.2* 15.0* 17.0* 17.5*  NEUTROABS  --  16.1* 13.5*  --  15.4*  HGB 16.6* 16.2* 14.2 13.4 15.1*  HCT 49.5* 48.0* 42.8 39.6 44.6  MCV 89.5 89.2 90.3 89.4 89.2  PLT 263 220 194 210 250   Basic Metabolic Panel: Recent Labs  Lab 05/23/19 0441 05/24/19 0634 05/25/19 0435 05/26/19 0814 05/27/19 0911  NA 138 140 141 145 148*  K 3.3* 4.3 3.6 3.0* 2.9*  CL 104 106 109 113* 112*  CO2 20* 22 21* 22 21*  GLUCOSE 236* 191* 181* 188* 200*  BUN 18 31* 32* 25* 27*  CREATININE 1.24* 1.66* 1.35* 1.11* 1.17*  CALCIUM 8.2* 8.4* 8.0* 8.0* 8.0*  MG  --   --  1.7  --  1.8   GFR: Estimated Creatinine Clearance: 44.5 mL/min (A) (by C-G formula based on SCr of 1.17 mg/dL (H)). Liver Function Tests: Recent Labs  Lab 05/23/19 0441 05/24/19 0634 05/25/19 0435 05/26/19 0814 05/27/19 0911  AST 31 45* 25 22 17   ALT 22 45* 29 24 23   ALKPHOS 42 38 43 63 72  BILITOT 1.1 1.4* 1.0 1.1 1.3*  PROT 6.2* 5.6* 5.6* 5.6* 6.0*  ALBUMIN 3.7 2.9* 2.4* 2.4* 2.5*   Recent Labs  Lab 05/22/19 2323 05/24/19 0634 05/25/19 1428  LIPASE 5,920* 373* 91*   Recent Labs  Lab 05/25/19 1428  AMMONIA 27   Coagulation Profile: Recent Labs  Lab 05/23/19 0441 05/24/19 0422 05/25/19 0435 05/26/19 0232 05/27/19 0250  INR 2.2* 4.1* 3.8* 4.0* 3.8*   Cardiac Enzymes: Recent Labs  Lab 05/22/19 2323  TROPONINI <0.03   BNP (last 3 results) No results for input(s): PROBNP in the last 8760 hours. HbA1C: No results for input(s): HGBA1C in the last 72 hours. CBG: Recent Labs  Lab 05/27/19 0440 05/27/19 0756 05/27/19 1129 05/27/19 1600 05/27/19 2030  GLUCAP 170* 204* 192* 186* 181*   Lipid Profile: No results for input(s): CHOL, HDL, LDLCALC, TRIG, CHOLHDL, LDLDIRECT in the last 72 hours. Thyroid Function Tests: Recent Labs    05/25/19 1428  TSH 0.908   FREET4 0.96   Anemia Panel: Recent Labs    05/25/19 1428  VITAMINB12 3,941*  FOLATE 9.0   Sepsis Labs: Recent Labs  Lab 05/23/19 0441 05/23/19 1514 05/24/19 0634 05/25/19 1428  LATICACIDVEN 2.8* 3.7* 2.8* 2.0*    Recent Results (from the past 240 hour(s))  SARS Coronavirus 2 (CEPHEID - Performed in [facility] hospital lab), Hosp Order     Status: None   Collection Time: 05/23/19 12:34 AM  Result Value Ref Range Status   SARS Coronavirus 2 NEGATIVE NEGATIVE Final    Comment: (NOTE) If result is NEGATIVE  SARS-CoV-2 target nucleic acids are NOT DETECTED. The SARS-CoV-2 RNA is generally detectable in upper and lower  respiratory specimens during the acute phase of infection. The lowest  concentration of SARS-CoV-2 viral copies this assay can detect is 250  copies / mL. A negative result does not preclude SARS-CoV-2 infection  and should not be used as the sole basis for treatment or other  patient management decisions.  A negative result may occur with  improper specimen collection / handling, submission of specimen other  than nasopharyngeal swab, presence of viral mutation(s) within the  areas targeted by this assay, and inadequate number of viral copies  (<250 copies / mL). A negative result must be combined with clinical  observations, patient history, and epidemiological information. If result is POSITIVE SARS-CoV-2 target nucleic acids are DETECTED. The SARS-CoV-2 RNA is generally detectable in upper and lower  respiratory specimens dur ing the acute phase of infection.  Positive  results are indicative of active infection with SARS-CoV-2.  Clinical  correlation with patient history and other diagnostic information is  necessary to determine patient infection status.  Positive results do  not rule out bacterial infection or co-infection with other viruses. If result is PRESUMPTIVE POSTIVE SARS-CoV-2 nucleic acids MAY BE PRESENT.   A presumptive positive result was  obtained on the submitted specimen  and confirmed on repeat testing.  While 2019 novel coronavirus  (SARS-CoV-2) nucleic acids may be present in the submitted sample  additional confirmatory testing may be necessary for epidemiological  and / or clinical management purposes  to differentiate between  SARS-CoV-2 and other Sarbecovirus currently known to infect humans.  If clinically indicated additional testing with an alternate test  methodology [phone]) is advised. The SARS-CoV-2 RNA is generally  detectable in upper and lower respiratory sp ecimens during the acute  phase of infection. The expected result is Negative. Fact Sheet for Patients:  StrictlyIdeas.no Fact Sheet for Healthcare Providers: BankingDealers.co.za This test is not yet approved or cleared by the [place] FDA and has been authorized for detection and/or diagnosis of SARS-CoV-2 by FDA under an Emergency Use Authorization (EUA).  This EUA will remain in effect (meaning this test can be used) for the duration of the COVID-19 declaration under Section 564(b)(1) of the Act, 21 U.S.C. section 360bbb-3(b)(1), unless the authorization is terminated or revoked sooner. Performed at [facility], [place] [address] [place],  [address]          Radiology Studies: Dg Chest Port 1 View  Result Date: 05/27/2019 CLINICAL DATA:  Shortness of breath and chest pressure today. EXAM: PORTABLE CHEST 1 VIEW COMPARISON:  Single-view of the chest 05/26/2019. FINDINGS: Bilateral pleural effusions and basilar atelectasis have somewhat increased. No pneumothorax. Cardiomegaly and atherosclerosis noted. IMPRESSION: Some increase in moderate to moderately large bilateral pleural effusions and basilar atelectasis. Cardiomegaly. Atherosclerosis. Electronically Signed   By: Inge Rise M.D.   On: 05/27/2019 16:27   Dg Chest Port 1 View  Result Date: 05/26/2019 CLINICAL DATA:   Increasing shortness of breath EXAM: PORTABLE CHEST 1 VIEW COMPARISON:  05/25/2019 FINDINGS: The bilateral pleural effusions have increased in size from the prior study. The heart size remains enlarged. Again noted are findings of pulmonary edema. There is no pneumothorax. Bibasilar airspace opacities are noted and are favored to represent atelectasis. IMPRESSION: Worsening volume overload as evidence by increasing bilateral pleural effusions. Electronically Signed   By: Constance Holster M.D.   On: 05/26/2019 22:52   Dg Chest [facility]  1 View  Result Date: 05/25/2019 CLINICAL DATA:  Shortness of breath EXAM: PORTABLE CHEST 1 VIEW COMPARISON:  05/22/2019 FINDINGS: The heart size is enlarged. There are new small bilateral pleural effusions which have increased in size from the prior study. There is no pneumothorax. Bibasilar airspace opacities are noted. There is no acute osseous abnormality. There is generalized volume overload. IMPRESSION: Developing small bilateral pleural effusions with worsening volume overload. Electronically Signed   By: Constance Holster M.D.   On: 05/25/2019 21:41   Dg Abd Portable 1v  Result Date: 05/27/2019 CLINICAL DATA:  Abdominal discomfort, constipation. EXAM: PORTABLE ABDOMEN - 1 VIEW COMPARISON:  None. FINDINGS: Mild dilated air-filled stomach is noted. No definite colonic or small bowel dilatation is noted. Moderate amount of stool is noted. No abnormal calcifications are noted. IMPRESSION: Mildly dilated air-filled stomach is noted. No definite evidence of large or small bowel dilatation. Moderate stool burden. Electronically Signed   By: Marijo Conception M.D.   On: 05/27/2019 11:11        Scheduled Meds: . cloNIDine  0.1 mg Transdermal Weekly  . furosemide  80 mg Intravenous BID  . insulin aspart  0-9 Units Subcutaneous Q4H  . levalbuterol  0.63 mg Nebulization Q6H  . methylPREDNISolone (SOLU-MEDROL) injection  40 mg Intravenous Q6H  . QUEtiapine  12.5 mg Oral BID   . sodium chloride flush  10-40 mL Intracatheter Q12H   Continuous Infusions: . ampicillin-sulbactam (UNASYN) IV 3 g (05/27/19 2014)  . diltiazem (CARDIZEM) infusion 10 mg/hr (05/27/19 1734)  . potassium chloride 10 mEq (05/27/19 2047)     LOS: 4 days    Time spent: 35 minutes spent in the coordination of care.   Author:  Berle Mull, MD [facility] 05/27/2019   To reach On-call, see care teams to locate the attending and reach out to them via [email or web address]. If 7PM-7AM, please contact night-coverage If you still have difficulty reaching the attending provider, please page the [facility] (Director on Call) for [facility] on amion for assistance.

## 2019-05-27 NOTE — Evaluation (Signed)
Clinical/Bedside Swallow Evaluation Patient Details  Name: NAKEYA ADINOLFI MRN: [id number] Date of Birth: [birth year]  Today's Date: 05/27/2019 Time: SLP Start Time (ACUTE ONLY): 0955 SLP Stop Time (ACUTE ONLY): 1014 SLP Time Calculation (min) (ACUTE ONLY): 19 min  Past Medical History:  Past Medical History:  Diagnosis Date  . Allergic rhinitis   . Arthritis   . Diabetes mellitus   . GERD (gastroesophageal reflux disease)   . Hyperlipidemia   . Hypertension   . Pain in joint, ankle and foot 09/14/2013   Past Surgical History:  Past Surgical History:  Procedure Laterality Date  . COLONOSCOPY     03/2018, removed polyps  . FRACTURE SURGERY Left     leg  . hiatal hernia surgery  2010  . ROTATOR CUFF REPAIR Right 2010  . TONSILLECTOMY     HPI:  78 y.o. female with medical history significant of DM2, HTN.  Patient presents to the ED with c/o epigastric abd pain, N/V.  Symptoms onset suddenly 3 hours ago, symptoms are severe, nothing makes better or worse, pain located in epigastrium. CXR on 05/26/19 indicated volume overload evidenced by bilateral pleural effusions; BSE ordered; pt NPO.    Assessment / Plan / Recommendation Clinical Impression   Pt with overt s/s of aspiration including immediate cough with thin liquids via tsp with subsequent swallows; delayed cough following sips of thin liquids via cup/straw and puree consistencies; solids and thickened liquids not attempted d/t pt's RR hovering between 33-38 and BP elevated throughout evaluation with respiratory effort/dyspnea increased with PO trials despite limiting volume and utilizing moderate verbal/visual cues during limited BSE; recommend NPO d/t risk for aspiration/respiratory status with ice chips allowed 1 at a time; ST will f/u for PO readiness while in acute setting and potential diet advancement.  SLP Visit Diagnosis: Dysphagia, pharyngeal phase (R13.13)    Aspiration Risk  Moderate aspiration risk    Diet  Recommendation   NPO; ice chips allowed  Medication Administration: Via alternative means    Other  Recommendations Oral Care Recommendations: Oral care QID   Follow up Recommendations Other (comment)(TBD)      Frequency and Duration min 2x/week  1 week       Prognosis Prognosis for Safe Diet Advancement: Fair      Swallow Study   General Date of Onset: 05/22/19 HPI: 78 y.o. female with medical history significant of DM2, HTN.  Patient presents to the ED with c/o epigastric abd pain, N/V.  Symptoms onset suddenly 3 hours ago, symptoms are severe, nothing makes better or worse, pain located in epigastrium. Type of Study: Bedside Swallow Evaluation Previous Swallow Assessment: (n/a) Diet Prior to this Study: NPO Temperature Spikes Noted: No Respiratory Status: Nasal cannula History of Recent Intubation: No Behavior/Cognition: Alert;Cooperative;Other (Comment)(anxious) Oral Cavity Assessment: Dry Oral Care Completed by SLP: Yes Oral Cavity - Dentition: Edentulous;Other (Comment)(has dentures) Vision: Functional for self-feeding Self-Feeding Abilities: Able to feed self;Needs assist Patient Positioning: Upright in bed Baseline Vocal Quality: Low vocal intensity;Breathy Volitional Cough: Strong Volitional Swallow: Able to elicit    Oral/Motor/Sensory Function Overall Oral Motor/Sensory Function: Generalized oral weakness Facial ROM: Within Functional Limits Lingual ROM: Within Functional Limits   Ice Chips Ice chips: Within functional limits Presentation: Spoon   Thin Liquid Thin Liquid: Impaired Presentation: Cup;Spoon Pharyngeal  Phase Impairments: Multiple swallows;Cough - Immediate;Cough - Delayed    Nectar Thick Nectar Thick Liquid: Not tested   Honey Thick Honey Thick Liquid: Not tested   Puree Puree: Impaired Presentation: Spoon  Pharyngeal Phase Impairments: Cough - Delayed   Solid     Solid: Not tested      Elvina Sidle, M.S., CCC-SLP 05/27/2019,11:47  AM

## 2019-05-27 NOTE — Progress Notes (Signed)
Patient given soap suds enema with positive. Patient had very large BM. States "stomach feels much better."

## 2019-05-27 NOTE — Progress Notes (Signed)
Pharmacy Antibiotic Note  Melanie [name] is a 79 y.o. female admitted on 05/22/2019 with intra-abdominal infection On zosyn and to change to unasyn -WBC= 17.5, afeb, CrCl ~ 45    Plan: -Unasyn 3gm IV q8h -Will follow renal function, cultures and clinical progress    Height: 5\' 4"  (162.6 cm) Weight: 204 lb 12.9 oz (92.9 kg) IBW/kg (Calculated) : 54.7  Temp (24hrs), Avg:98.2 F (36.8 C), Min:97.8 F (36.6 C), Max:98.7 F (37.1 C)  Recent Labs  Lab 05/22/19 2323 05/22/19 2338 05/23/19 0441 05/23/19 1514 05/24/19 0634 05/25/19 0435 05/25/19 1428 05/26/19 0814 05/27/19 0911  WBC 26.7*  --  13.6*  --  19.2* 15.0*  --  17.0* 17.5*  CREATININE 1.30* 1.20* 1.24*  --  1.66* 1.35*  --  1.11*  --   LATICACIDVEN 2.3*  --  2.8* 3.7* 2.8*  --  2.0*  --   --     Estimated Creatinine Clearance: 46.9 mL/min (A) (by C-G formula based on SCr of 1.11 mg/dL (H)).    Allergies  Allergen Reactions  . Niacin And Related Rash  . Keflex [Cephalexin] Other (See Comments)    Fatigue, sore throat, body aches  . Sausage [Pickled Meat] Other (See Comments)    Drowsiness and dizziness  . Tape Rash    Bandaids     Thank you for allowing pharmacy to be a part of this patient's care.  Hildred Laser, PharmD Clinical Pharmacist **Pharmacist phone directory can now be found on [email or web address] ([email or web address]).  Listed under [place].

## 2019-05-27 NOTE — Progress Notes (Signed)
RT entered pts room to find pt already on BIPAP dream station. Pt on Bi Level mode 16/6 with 7 Lpm bled into the system. RT administering breathing treatment at this time and will return pt back to BIPAP dream station when complete. RT will continue to monitor.

## 2019-05-27 NOTE — Progress Notes (Signed)
Working harder to breathe this afternoon. Cardizem drip now at 43ml/hr O2 now at 5L. Dr Posey Pronto informed order for  x-ray received.

## 2019-05-28 ENCOUNTER — Inpatient Hospital Stay (HOSPITAL_COMMUNITY): Payer: Medicare HMO

## 2019-05-28 LAB — BLOOD GAS, ARTERIAL
Acid-base deficit: 6.8 mmol/L — ABNORMAL HIGH (ref 0.0–2.0)
Bicarbonate: 16.6 mmol/L — ABNORMAL LOW (ref 20.0–28.0)
Delivery systems: POSITIVE
Drawn by: 55062
Expiratory PAP: 6
FIO2: 60
Inspiratory PAP: 16
Mode: POSITIVE
O2 Saturation: 94.1 %
Patient temperature: 98.6
pCO2 arterial: 25.3 mmHg — ABNORMAL LOW (ref 32.0–48.0)
pH, Arterial: 7.434 (ref 7.350–7.450)
pO2, Arterial: 74.4 mmHg — ABNORMAL LOW (ref 83.0–108.0)

## 2019-05-28 LAB — CBC
HCT: 43.5 % (ref 36.0–46.0)
Hemoglobin: 14.6 g/dL (ref 12.0–15.0)
MCH: 30 pg (ref 26.0–34.0)
MCHC: 33.6 g/dL (ref 30.0–36.0)
MCV: 89.3 fL (ref 80.0–100.0)
Platelets: 252 10*3/uL (ref 150–400)
RBC: 4.87 MIL/uL (ref 3.87–5.11)
RDW: 14.4 % (ref 11.5–15.5)
WBC: 14.9 10*3/uL — ABNORMAL HIGH (ref 4.0–10.5)
nRBC: 0.7 % — ABNORMAL HIGH (ref 0.0–0.2)

## 2019-05-28 LAB — GLUCOSE, CAPILLARY
Glucose-Capillary: 224 mg/dL — ABNORMAL HIGH (ref 70–99)
Glucose-Capillary: 233 mg/dL — ABNORMAL HIGH (ref 70–99)
Glucose-Capillary: 248 mg/dL — ABNORMAL HIGH (ref 70–99)
Glucose-Capillary: 282 mg/dL — ABNORMAL HIGH (ref 70–99)
Glucose-Capillary: 283 mg/dL — ABNORMAL HIGH (ref 70–99)
Glucose-Capillary: 292 mg/dL — ABNORMAL HIGH (ref 70–99)
Glucose-Capillary: 299 mg/dL — ABNORMAL HIGH (ref 70–99)

## 2019-05-28 LAB — BASIC METABOLIC PANEL
Anion gap: 16 — ABNORMAL HIGH (ref 5–15)
BUN: 48 mg/dL — ABNORMAL HIGH (ref 8–23)
CO2: 19 mmol/L — ABNORMAL LOW (ref 22–32)
Calcium: 7.6 mg/dL — ABNORMAL LOW (ref 8.9–10.3)
Chloride: 114 mmol/L — ABNORMAL HIGH (ref 98–111)
Creatinine, Ser: 2.07 mg/dL — ABNORMAL HIGH (ref 0.44–1.00)
GFR calc Af Amer: 26 mL/min — ABNORMAL LOW (ref >60)
GFR calc non Af Amer: 23 mL/min — ABNORMAL LOW (ref >60)
Glucose, Bld: 267 mg/dL — ABNORMAL HIGH (ref 70–99)
Potassium: 3.5 mmol/L (ref 3.5–5.1)
Sodium: 149 mmol/L — ABNORMAL HIGH (ref 135–145)

## 2019-05-28 LAB — PROTIME-INR
INR: 5.1 (ref 0.8–1.2)
Prothrombin Time: 46.2 seconds — ABNORMAL HIGH (ref 11.4–15.2)

## 2019-05-28 MED ORDER — SODIUM CHLORIDE 0.9 % IV SOLN
3.0000 g | Freq: Two times a day (BID) | INTRAVENOUS | Status: DC
  Administered 2019-05-28 – 2019-05-29 (×2): 3 g via INTRAVENOUS
  Filled 2019-05-28 (×3): qty 3

## 2019-05-28 MED ORDER — FUROSEMIDE 10 MG/ML IJ SOLN
60.0000 mg | Freq: Once | INTRAMUSCULAR | Status: AC
  Administered 2019-05-28: 60 mg via INTRAVENOUS
  Filled 2019-05-28: qty 6

## 2019-05-28 NOTE — Progress Notes (Signed)
RT NOTE: RT arrived to bedside to find patient on BIPAP dream station. Per RT and RN patient has required BIPAP dream station continuously. Patient on Bi Level mode 16/6 with 7Lpm bled into the dream station. RT administered breathing treatment and when finished RT placed patient back on BIPAP dream station. RT will continue to monitor.

## 2019-05-28 NOTE — Progress Notes (Signed)
On call tiad NP charles Bodenhimer text paged stating pt's HR 120-150's afib in Cardizem drip, RR in 40's with bi-pap and continuous loose stool. Waiting on call back.

## 2019-05-28 NOTE — Progress Notes (Signed)
Rapid response RN Mindy was by bedside around 2015 when we were cleaning patient. When Bodenheimer NP called back around [id number] order received for rectal tube, Iv lasix, portable CXR and Foley. Rapid response RN also talked with him, and made aware of patient's current situation. Rapid talked to RT and requested for machine change, Blood gas was also ordered. Once foley was placed pt instantly put out 200 cc of clear yellow urine. Pt had large black liquid stool , now on flexiseal. Rapid responce RN assisted with pt care.Pt Alert and oriented and able to answer question. Will continue to monitor.

## 2019-05-28 NOTE — Progress Notes (Signed)
SLP Cancellation Note  Patient Details Name: Melanie [name] MRN: [id number] DOB: 1941/05/22   Cancelled treatment:       Reason Eval/Treat Not Completed: Patient at procedure or test/unavailable   Elvina Sidle, M.S., CCC-SLP 05/28/2019, 1:24 PM

## 2019-05-28 NOTE — Progress Notes (Signed)
ANTICOAGULATION CONSULT NOTE - Follow-up  Pharmacy Consult for Heparin Indication: atrial fibrillation  Allergies  Allergen Reactions  . Niacin And Related Rash  . Keflex [Cephalexin] Other (See Comments)    Fatigue, sore throat, body aches  . Sausage [Pickled Meat] Other (See Comments)    Drowsiness and dizziness  . Tape Rash    Bandaids     Patient Measurements: Height: 5\' 4"  (162.6 cm) Weight: 204 lb 12.9 oz (92.9 kg) IBW/kg (Calculated) : 54.7 Heparin Dosing Weight: 75kg  Vital Signs: Temp: 98.3 F (36.8 C) (06/06 0435) Temp Source: Axillary (06/06 0435) BP: 120/89 (06/06 0435) Pulse Rate: 92 (06/06 0435)  Labs: Recent Labs    05/26/19 0232  05/26/19 0814 05/27/19 0250 05/27/19 0911 05/28/19 0249  HGB  --    < > 13.4  --  15.1* 14.6  HCT  --   --  39.6  --  44.6 43.5  PLT  --   --  210  --  236 252  LABPROT 38.3*  --   --  36.8*  --  46.2*  INR 4.0*  --   --  3.8*  --  5.1*  CREATININE  --   --  1.11*  --  1.17* 2.07*   < > = values in this interval not displayed.    Estimated Creatinine Clearance: 25.2 mL/min (A) (by C-G formula based on SCr of 2.07 mg/dL (H)).   Medical History: Past Medical History:  Diagnosis Date  . Allergic rhinitis   . Arthritis   . Diabetes mellitus   . GERD (gastroesophageal reflux disease)   . Hyperlipidemia   . Hypertension   . Pain in joint, ankle and foot 09/14/2013    Medications:  Medications Prior to Admission  Medication Sig Dispense Refill Last Dose  . Ascorbic Acid (VITAMIN C) 1000 MG tablet Take 1,000 mg by mouth 2 (two) times daily.   Taking  . Calcium Carb-Cholecalciferol (CALTRATE 600+D3 SOFT PO) Take 1 tablet by mouth 2 (two) times daily.   Taking  . CRANBERRY PO Take 2 tablets by mouth daily.   Taking  . Cyanocobalamin (VITAMIN B-12) 1000 MCG SUBL Place 1 each under the tongue daily.   Taking  . diltiazem (CARDIZEM) 90 MG tablet TAKE 1 TABLET (90 MG TOTAL) BY MOUTH 2 (TWO) TIMES DAILY. 180 tablet 1   .  fish oil-omega-3 fatty acids 1000 MG capsule Take 2 g by mouth daily.   Taking  . fluticasone (FLONASE) 50 MCG/ACT nasal spray Place 2 sprays into both nostrils daily as needed for allergies or rhinitis.   Taking  . furosemide (LASIX) 20 MG tablet TAKE 1 TABLET BY MOUTH EVERY DAY (Patient taking differently: Take 20 mg by mouth daily. ) 90 tablet 1 Taking  . hydrocortisone 2.5 % cream APPLY TO THE FACE TWICE A DAY AS NEEDED 1 WEEK ON THEN 1 WEEK OFF  0 Taking  . lisinopril-hydrochlorothiazide (PRINZIDE,ZESTORETIC) 20-12.5 MG tablet TAKE 1 TABLET BY MOUTH EVERY DAY (Patient taking differently: Take 1 tablet by mouth daily. ) 90 tablet 1 Taking  . loperamide (IMODIUM A-D) 2 MG capsule Take by mouth as needed for diarrhea or loose stools.   Taking  . metFORMIN (GLUCOPHAGE) 500 MG tablet TAKE 1 TABLET (500 MG TOTAL) BY MOUTH 2 (TWO) TIMES DAILY WITH A MEAL. 180 tablet 1 Taking  . metoprolol tartrate (LOPRESSOR) 25 MG tablet Take 1.5 tablets (37.5 mg total) by mouth 2 (two) times daily. (Patient taking differently: Take 25 mg  by mouth 2 (two) times daily. ) 180 tablet 1 Taking  . omeprazole (PRILOSEC) 20 MG capsule TAKE 1 CAPSULE BY MOUTH EVERY DAY (Patient taking differently: Take 20 mg by mouth daily. ) 90 capsule 1   . pravastatin (PRAVACHOL) 40 MG tablet Take 1 tablet (40 mg total) by mouth daily. 90 tablet 3 Taking  . Probiotic Product (PROBIOTIC PO) Take 1 capsule by mouth daily.    Taking  . sulfamethoxazole-trimethoprim (BACTRIM DS) 800-160 MG tablet Take 1 tablet by mouth 2 (two) times daily. 10 tablet 0   . vitamin E 100 UNIT capsule Take by mouth.   Taking  . warfarin (COUMADIN) 5 MG tablet TAKE AS DIRECTED BY COUMADIN CLINIC (Patient taking differently: Take 5-7.5 mg by mouth See admin instructions. Take 1 1/2 tablet (7.5 mg) by mouth on Monday, Wednesday, Friday, take 1 tablet (5 mg) on Sunday, Tuesday, Thursday, Saturday - or as directed by Coumadin Clinic) 120 tablet 0    Scheduled:  .  cloNIDine  0.1 mg Transdermal Weekly  . insulin aspart  0-9 Units Subcutaneous Q4H  . levalbuterol  0.63 mg Nebulization Q6H  . methylPREDNISolone (SOLU-MEDROL) injection  40 mg Intravenous Q6H  . QUEtiapine  12.5 mg Oral BID  . sodium chloride flush  10-40 mL Intracatheter Q12H   Infusions:  . ampicillin-sulbactam (UNASYN) IV 3 g (05/28/19 0511)  . diltiazem (CARDIZEM) infusion 10 mg/hr (05/28/19 0157)    Assessment: 78yo female c/o N/V, admitted for acute pancreatitis, to transition from Coumadin to heparin while NPO. Admit INR 2.1, last dose of Coumadin unknown. Patient has not received any doses of Coumadin while inpatient.  INR today up to 5.1. CBC remains normal. Large increase in INR overnight possibly due to NPO status.   Goal of Therapy:  Heparin level 0.3-0.7 units/ml Monitor platelets by anticoagulation protocol: Yes   Plan:  Contiune to monitor INR daily and will begin IV heparin when INR <2.  Jackson Latino, PharmD PGY1 Pharmacy Resident Phone [phone] 05/28/2019     7:28 AM

## 2019-05-28 NOTE — Progress Notes (Signed)
CRITICAL VALUE ALERT  Critical Value:  INR 5.1  Date & Time Notied:  4360 05/28/19  Provider Notified: Silas Sacramento, NP  Orders Received/Actions taken: will watch patient for now and will let pass it along to days about elevated INR

## 2019-05-28 NOTE — Progress Notes (Signed)
Pt resting in bed, mouth care given, pain medication given as requested. RR in 30's . HR got better 110-120's. New bi-pap machine replaced per RT. Blood gas and portable chest x-raycompleted . Will continue to monitor.

## 2019-05-28 NOTE — Progress Notes (Signed)
[name]  PROGRESS NOTE    Melanie [name]  ELF:[id number] DOB: May 23, 1941 DOA: 05/22/2019 PCP: [name], MD   Brief Narrative:   Melanie [name] is a 78 y.o. female with medical history significant of DM2, HTN.  Patient presents to the ED with c/o epigastric abd pain, N/V.  Symptoms onset suddenly 3 hours ago, symptoms are severe, nothing makes better or worse, pain located in epigastrium.   Assessment & Plan: Acute pancreatitis     - IVF: 2L bolus in ED and We will stop the IV fluids now.     - Continue Unasyn     - NPO except meds     - zofran PRN nausea     - RUQ [place] w/o evidence of acute chole, however, CBD is 48mm; spoke GI, order MRCP if stone detected, call them back     - Dilaudid PRN pain     - MRCP w/o stone in CBD, CBD is 19mm, gallbladder w/ sludge/stones, but no acute chole     - lipase is greatly improved     - WBC and temperature curve overall improved; continue current Antibiotics, will eventually require general surgery consultation for lap chole  A.Fib - (A.Flutter at the moment actually)     - Hold coumadin, convert to heparin gtt when INR subtheraputic; INR is still elevated     -Patient unable to take anything by mouth.  We will transition to IV Cardizem infusion as well as IV Lopressor as needed.  Add clonidine patch.  Acute hypoxic respiratory failure. Acute on chronic diastolic CHF. Secondary to volume overload from IV fluids. We will continue with IV Lasix for now. BiPAP PRN. Monitor. Xopenex every 6 hours.  Type 2 diabetes mellitus, poorly controlled with hyper and hypoglycemia     - Hold metformin     - Sensitive SSI Q4H  HTN     - hypotensive at presentation     - Hold losartan/HCTZ  Acute encephalopathy. Likely multifactorial. CT head unremarkable. We will get further work-up.  Check EEG. Discussed with son at her baseline the patient does have some issues with memory but this current presentation is more different than her baseline.  ABG negative for hypercarbia. EEG negative for any active seizures or shows metabolic encephalopathy. We will hold Seroquel. QTC is not prolonged for now. If no improvement with addition of QTC we will consult neurology. UDS only positive for opiates.  Patient was recently here in the hospital.  DVT prophylaxis: SCD Code Status: FULL   Disposition Plan: TBD   Antimicrobials:  Deniece Ree, zosyn    Subjective: Continues to have shortness of breath but no nausea no vomiting no fever no chills.  Improvement in mentation.  Objective: Vitals:   05/28/19 0951 05/28/19 1139 05/28/19 1515 05/28/19 1550  BP: 132/79 (!) 137/103 115/75 117/85  Pulse: (!) 118 (!) 121 (!) 132 (!) 145  Resp: (!) 29 (!) 32 (!) 37 (!) 24  Temp:  97.6 F (36.4 C)  98.3 F (36.8 C)  TempSrc:  Axillary  Axillary  SpO2: 95% 94% 94% 100%  Weight:      Height:        Intake/Output Summary (Last 24 hours) at 05/28/2019 1906 Last data filed at 05/28/2019 1742 Gross per 24 hour  Intake 794.62 ml  Output -  Net 794.62 ml   Filed Weights   05/22/19 2216 05/23/19 0355  Weight: 99.8 kg 92.9 kg    Examination: General: Awake, fatigue  and oriented to time, place, and person. Appear in marked distress, affect emotionally labile Eyes: PERRL, Conjunctiva normal ENT: Oral Mucosa Clear, dry  Neck: NO JVD, NO Abnormal Mass Or lumps Cardiovascular: S1 and S2 Present, NO Murmur, peripheral pulses symmetrical Respiratory: normal respiratory effort, Bilateral Air entry equal and Decreased, NO use of accessory muscle, Clear to Auscultation, NO Crackles, NO wheezes Abdomen: Bowel Sound PREsent, Soft and difuse tenderness, no hernia Skin: no rashes  Extremities: no Pedal edema, no calf tenderness Neurologic: PERLA, Motor strength 5/5 and symmetric and sensation grossly normal Gait not checked due to patient safety concerns   Data Reviewed: I have personally reviewed following labs and imaging studies.  CBC: Recent Labs   Lab 2019/06/02 0634 05/25/19 0435 05/26/19 0814 05/27/19 0911 05/28/19 0249  WBC 19.2* 15.0* 17.0* 17.5* 14.9*  NEUTROABS 16.1* 13.5*  --  15.4*  --   HGB 16.2* 14.2 13.4 15.1* 14.6  HCT 48.0* 42.8 39.6 44.6 43.5  MCV 89.2 90.3 89.4 89.2 89.3  PLT 220 194 210 236 213   Basic Metabolic Panel: Recent Labs  Lab 06-02-2019 0634 05/25/19 0435 05/26/19 0814 05/27/19 0911 05/28/19 0249  NA 140 141 145 148* 149*  K 4.3 3.6 3.0* 2.9* 3.5  CL 106 109 113* 112* 114*  CO2 22 21* 22 21* 19*  GLUCOSE 191* 181* 188* 200* 267*  BUN 31* 32* 25* 27* 48*  CREATININE 1.66* 1.35* 1.11* 1.17* 2.07*  CALCIUM 8.4* 8.0* 8.0* 8.0* 7.6*  MG  --  1.7  --  1.8  --    GFR: Estimated Creatinine Clearance: 25.2 mL/min (A) (by C-G formula based on SCr of 2.07 mg/dL (H)). Liver Function Tests: Recent Labs  Lab 05/23/19 0441 02-Jun-2019 0634 05/25/19 0435 05/26/19 0814 05/27/19 0911  AST 31 45* 25 22 17   ALT 22 45* 29 24 23   ALKPHOS 42 38 43 63 72  BILITOT 1.1 1.4* 1.0 1.1 1.3*  PROT 6.2* 5.6* 5.6* 5.6* 6.0*  ALBUMIN 3.7 2.9* 2.4* 2.4* 2.5*   Recent Labs  Lab 05/22/19 2323 06/02/19 0634 05/25/19 1428  LIPASE 5,920* 373* 91*   Recent Labs  Lab 05/25/19 1428  AMMONIA 27   Coagulation Profile: Recent Labs  Lab 06-02-2019 0422 05/25/19 0435 05/26/19 0232 05/27/19 0250 05/28/19 0249  INR 4.1* 3.8* 4.0* 3.8* 5.1*   Cardiac Enzymes: Recent Labs  Lab 05/22/19 2323  TROPONINI <0.03   BNP (last 3 results) No results for input(s): PROBNP in the last 8760 hours. HbA1C: No results for input(s): HGBA1C in the last 72 hours. CBG: Recent Labs  Lab 05/28/19 0032 05/28/19 0433 05/28/19 0810 05/28/19 1142 05/28/19 1549  GLUCAP 233* 282* 224* 248* 283*   Lipid Profile: No results for input(s): CHOL, HDL, LDLCALC, TRIG, CHOLHDL, LDLDIRECT in the last 72 hours. Thyroid Function Tests: No results for input(s): TSH, T4TOTAL, FREET4, T3FREE, THYROIDAB in the last 72 hours. Anemia Panel: No  results for input(s): VITAMINB12, FOLATE, FERRITIN, TIBC, IRON, RETICCTPCT in the last 72 hours. Sepsis Labs: Recent Labs  Lab 05/23/19 0441 05/23/19 1514 06-02-19 0634 05/25/19 1428  LATICACIDVEN 2.8* 3.7* 2.8* 2.0*    Recent Results (from the past 240 hour(s))  SARS Coronavirus 2 (CEPHEID - Performed in [place] hospital lab), Hosp Order     Status: None   Collection Time: 05/23/19 12:34 AM  Result Value Ref Range Status   SARS Coronavirus 2 NEGATIVE NEGATIVE Final    Comment: (NOTE) If result is NEGATIVE SARS-CoV-2 target nucleic acids are  NOT DETECTED. The SARS-CoV-2 RNA is generally detectable in upper and lower  respiratory specimens during the acute phase of infection. The lowest  concentration of SARS-CoV-2 viral copies this assay can detect is 250  copies / mL. A negative result does not preclude SARS-CoV-2 infection  and should not be used as the sole basis for treatment or other  patient management decisions.  A negative result may occur with  improper specimen collection / handling, submission of specimen other  than nasopharyngeal swab, presence of viral mutation(s) within the  areas targeted by this assay, and inadequate number of viral copies  (<250 copies / mL). A negative result must be combined with clinical  observations, patient history, and epidemiological information. If result is POSITIVE SARS-CoV-2 target nucleic acids are DETECTED. The SARS-CoV-2 RNA is generally detectable in upper and lower  respiratory specimens dur ing the acute phase of infection.  Positive  results are indicative of active infection with SARS-CoV-2.  Clinical  correlation with patient history and other diagnostic information is  necessary to determine patient infection status.  Positive results do  not rule out bacterial infection or co-infection with other viruses. If result is PRESUMPTIVE POSTIVE SARS-CoV-2 nucleic acids MAY BE PRESENT.   A presumptive positive result was  obtained on the submitted specimen  and confirmed on repeat testing.  While 2019 novel coronavirus  (SARS-CoV-2) nucleic acids may be present in the submitted sample  additional confirmatory testing may be necessary for epidemiological  and / or clinical management purposes  to differentiate between  SARS-CoV-2 and other Sarbecovirus currently known to infect humans.  If clinically indicated additional testing with an alternate test  methodology [phone]) is advised. The SARS-CoV-2 RNA is generally  detectable in upper and lower respiratory sp ecimens during the acute  phase of infection. The expected result is Negative. Fact Sheet for Patients:  StrictlyIdeas.no Fact Sheet for Healthcare Providers: BankingDealers.co.za This test is not yet approved or cleared by the [place] FDA and has been authorized for detection and/or diagnosis of SARS-CoV-2 by FDA under an Emergency Use Authorization (EUA).  This EUA will remain in effect (meaning this test can be used) for the duration of the COVID-19 declaration under Section 564(b)(1) of the Act, 21 U.S.C. section 360bbb-3(b)(1), unless the authorization is terminated or revoked sooner. Performed at [facility], [place] [address], [place], [place] [address]          Radiology Studies: Dg Chest Port 1 View  Result Date: 05/27/2019 CLINICAL DATA:  Shortness of breath and chest pressure today. EXAM: PORTABLE CHEST 1 VIEW COMPARISON:  Single-view of the chest 05/26/2019. FINDINGS: Bilateral pleural effusions and basilar atelectasis have somewhat increased. No pneumothorax. Cardiomegaly and atherosclerosis noted. IMPRESSION: Some increase in moderate to moderately large bilateral pleural effusions and basilar atelectasis. Cardiomegaly. Atherosclerosis. Electronically Signed   By: Inge Rise M.D.   On: 05/27/2019 16:27   Dg Chest Port 1 View  Result Date: 05/26/2019 CLINICAL DATA:   Increasing shortness of breath EXAM: PORTABLE CHEST 1 VIEW COMPARISON:  05/25/2019 FINDINGS: The bilateral pleural effusions have increased in size from the prior study. The heart size remains enlarged. Again noted are findings of pulmonary edema. There is no pneumothorax. Bibasilar airspace opacities are noted and are favored to represent atelectasis. IMPRESSION: Worsening volume overload as evidence by increasing bilateral pleural effusions. Electronically Signed   By: Constance Holster M.D.   On: 05/26/2019 22:52   Dg Abd Portable 1v  Result Date: 05/27/2019  CLINICAL DATA:  Abdominal discomfort, constipation. EXAM: PORTABLE ABDOMEN - 1 VIEW COMPARISON:  None. FINDINGS: Mild dilated air-filled stomach is noted. No definite colonic or small bowel dilatation is noted. Moderate amount of stool is noted. No abnormal calcifications are noted. IMPRESSION: Mildly dilated air-filled stomach is noted. No definite evidence of large or small bowel dilatation. Moderate stool burden. Electronically Signed   By: Marijo Conception M.D.   On: 05/27/2019 11:11        Scheduled Meds: . cloNIDine  0.1 mg Transdermal Weekly  . insulin aspart  0-9 Units Subcutaneous Q4H  . levalbuterol  0.63 mg Nebulization Q6H  . methylPREDNISolone (SOLU-MEDROL) injection  40 mg Intravenous Q6H  . QUEtiapine  12.5 mg Oral BID  . sodium chloride flush  10-40 mL Intracatheter Q12H   Continuous Infusions: . ampicillin-sulbactam (UNASYN) IV 3 g (05/28/19 1742)  . diltiazem (CARDIZEM) infusion 15 mg/hr (05/28/19 1248)     LOS: 5 days    Time spent: 35 minutes spent in the coordination of care.   Author:  Berle Mull, MD [facility] 05/28/2019   To reach On-call, see care teams to locate the attending and reach out to them via [email or web address]. If 7PM-7AM, please contact night-coverage If you still have difficulty reaching the attending provider, please page the [facility] (Director on Call) for [facility] on amion for  assistance.

## 2019-05-28 NOTE — Significant Event (Addendum)
Rapid Response Event Note  Overview:Pt on RR radar list, RR RN was rounding on pt,pt in respiratory distress.  Time Called: 2045(Pt on radar list, was rounding on pt) Arrival Time: 2045 Event Type: Respiratory  Initial Focused Assessment: Pt laying in bed, will answer questions appropriately and follow commands-skin warm and dry.  Pt with increased WOB, RR-45, SpO2-95-98% on bipap with 7L bled in, HR-120s-140s (afib), BP-110/77,  T-98.4. Pt is on 15mg  cardizem IV with prn metoprolol IV q6h for HR >100(BP has not tolerated this today). Lungs coarse t/o. Pt having loose stools and having to be cleaned up repeatedly. Unclear how much urine pt has made this shift d/t incontinence.  Interventions: ABG-7.43/25/74/16 PCXR- Lung volumes are slightly low. Bibasilar opacities may reflect moderate bilateral pleural effusions with areas of atelectasis and/or consolidation throughout the lung bases bilaterally. No evidence of pulmonary edema. Mild cardiomegaly. Upper mediastinal contours are within normal limits. Aortic atherosclerosis.-Unchanged appearance of chest.  60mg  Lasix IV x 1 Flexiseal Foley cathether Change to newer bipap machine so can see stats.  Plan of Care (if not transferred): Place flexiseal, foley(done with staff), give Lasix, notify MD of ABG and PCXR results.  Event Summary: Name of Physician Notified: Silas Sacramento, NP at 2102    at          [place], Carren Rang

## 2019-05-28 NOTE — Progress Notes (Signed)
Pharmacy Antibiotic Note  Melanie [name] is a 78 y.o. female admitted on 05/22/2019 with acute pancreatitis and possible intra-abdominal infection. RUQ [place] and MRCP without evidence of acute chole or stone in CBD. Pharmacy has been consulted for Unasyn dosing.  Today is day 6 of antibiotics. Patient remains afebrile and WBC is down to 14.9. Patient's SCr up from previous to 2.07 (CrCl ~ 25 mL/min), likely due to IV diuresis and NPO status. Will dose adjust Unasyn dose for renal function.  Plan: -Decrease Unasyn to 3gm IV q12h -Monitor renal function, clinical progress, and length of therapy   Height: 5\' 4"  (162.6 cm) Weight: 204 lb 12.9 oz (92.9 kg) IBW/kg (Calculated) : 54.7  Temp (24hrs), Avg:98.4 F (36.9 C), Min:98.3 F (36.8 C), Max:98.6 F (37 C)  Recent Labs  Lab 05/22/19 2323  05/23/19 0441 05/23/19 1514 05/24/19 0634 05/25/19 0435 05/25/19 1428 05/26/19 0814 05/27/19 0911 05/28/19 0249  WBC 26.7*  --  13.6*  --  19.2* 15.0*  --  17.0* 17.5* 14.9*  CREATININE 1.30*   < > 1.24*  --  1.66* 1.35*  --  1.11* 1.17* 2.07*  LATICACIDVEN 2.3*  --  2.8* 3.7* 2.8*  --  2.0*  --   --   --    < > = values in this interval not displayed.    Estimated Creatinine Clearance: 25.2 mL/min (A) (by C-G formula based on SCr of 2.07 mg/dL (H)).    Allergies  Allergen Reactions  . Niacin And Related Rash  . Keflex [Cephalexin] Other (See Comments)    Fatigue, sore throat, body aches  . Sausage [Pickled Meat] Other (See Comments)    Drowsiness and dizziness  . Tape Rash    Bandaids    Antimicrobials this admission:  Zosyn 6/1 >> 6/5 Unasyn 6/5 >>  Dose adjustments this admission:  6/6: Unasyn 3 g IV q8h >> q12h  Microbiology results: no cultures this admission  Thank you for allowing pharmacy to be a part of this patient's care.  Jackson Latino, PharmD PGY1 Pharmacy Resident Phone [phone] 05/28/2019     7:34 AM

## 2019-05-28 NOTE — Plan of Care (Signed)
  Problem: Clinical Measurements: Goal: Respiratory complications will improve Outcome: Progressing   Problem: Health Behavior/Discharge Planning: Goal: Ability to manage health-related needs will improve Outcome: Not Progressing   Problem: Nutrition: Goal: Adequate nutrition will be maintained Outcome: Not Progressing   

## 2019-05-28 NOTE — Progress Notes (Signed)
Patient was able to use call bell to ask for pain medicine. Patient also was oriented to self, birth date and place.

## 2019-05-29 ENCOUNTER — Inpatient Hospital Stay (HOSPITAL_COMMUNITY): Payer: Medicare HMO

## 2019-05-29 LAB — CBC
HCT: 31.3 % — ABNORMAL LOW (ref 36.0–46.0)
HCT: 32.3 % — ABNORMAL LOW (ref 36.0–46.0)
HCT: 27.4 % — ABNORMAL LOW (ref 36.0–46.0)
Hemoglobin: 10.3 g/dL — ABNORMAL LOW (ref 12.0–15.0)
Hemoglobin: 10.8 g/dL — ABNORMAL LOW (ref 12.0–15.0)
Hemoglobin: 9.2 g/dL — ABNORMAL LOW (ref 12.0–15.0)
MCH: 30 pg (ref 26.0–34.0)
MCH: 30.3 pg (ref 26.0–34.0)
MCH: 30.4 pg (ref 26.0–34.0)
MCHC: 32.9 g/dL (ref 30.0–36.0)
MCHC: 33.4 g/dL (ref 30.0–36.0)
MCHC: 33.6 g/dL (ref 30.0–36.0)
MCV: 91.3 fL (ref 80.0–100.0)
MCV: 90.5 fL (ref 80.0–100.0)
MCV: 90.4 fL (ref 80.0–100.0)
Platelets: 263 10*3/uL (ref 150–400)
Platelets: 268 10*3/uL (ref 150–400)
Platelets: 242 10*3/uL (ref 150–400)
RBC: 3.43 MIL/uL — ABNORMAL LOW (ref 3.87–5.11)
RBC: 3.57 MIL/uL — ABNORMAL LOW (ref 3.87–5.11)
RBC: 3.03 MIL/uL — ABNORMAL LOW (ref 3.87–5.11)
RDW: 14.9 % (ref 11.5–15.5)
RDW: 14.8 % (ref 11.5–15.5)
RDW: 14.9 % (ref 11.5–15.5)
WBC: 23.9 10*3/uL — ABNORMAL HIGH (ref 4.0–10.5)
WBC: 29.3 10*3/uL — ABNORMAL HIGH (ref 4.0–10.5)
WBC: 26.9 10*3/uL — ABNORMAL HIGH (ref 4.0–10.5)
nRBC: 1.7 % — ABNORMAL HIGH (ref 0.0–0.2)
nRBC: 1.6 % — ABNORMAL HIGH (ref 0.0–0.2)
nRBC: 1.3 % — ABNORMAL HIGH (ref 0.0–0.2)

## 2019-05-29 LAB — IRON AND TIBC
Iron: 68 ug/dL (ref 28–170)
Saturation Ratios: 34 % — ABNORMAL HIGH (ref 10.4–31.8)
TIBC: 200 ug/dL — ABNORMAL LOW (ref 250–450)
UIBC: 132 ug/dL

## 2019-05-29 LAB — CBC WITH DIFFERENTIAL/PLATELET
Abs Immature Granulocytes: 1.07 10*3/uL — ABNORMAL HIGH (ref 0.00–0.07)
Basophils Absolute: 0.1 10*3/uL (ref 0.0–0.1)
Basophils Relative: 0 %
Eosinophils Absolute: 0 10*3/uL (ref 0.0–0.5)
Eosinophils Relative: 0 %
HCT: 33.9 % — ABNORMAL LOW (ref 36.0–46.0)
Hemoglobin: 11.1 g/dL — ABNORMAL LOW (ref 12.0–15.0)
Immature Granulocytes: 4 %
Lymphocytes Relative: 5 %
Lymphs Abs: 1.5 10*3/uL (ref 0.7–4.0)
MCH: 29.9 pg (ref 26.0–34.0)
MCHC: 32.7 g/dL (ref 30.0–36.0)
MCV: 91.4 fL (ref 80.0–100.0)
Monocytes Absolute: 1.2 10*3/uL — ABNORMAL HIGH (ref 0.1–1.0)
Monocytes Relative: 4 %
Neutro Abs: 27.2 10*3/uL — ABNORMAL HIGH (ref 1.7–7.7)
Neutrophils Relative %: 87 %
Platelets: 303 10*3/uL (ref 150–400)
RBC: 3.71 MIL/uL — ABNORMAL LOW (ref 3.87–5.11)
RDW: 14.8 % (ref 11.5–15.5)
WBC: 31 10*3/uL — ABNORMAL HIGH (ref 4.0–10.5)
nRBC: 1.3 % — ABNORMAL HIGH (ref 0.0–0.2)

## 2019-05-29 LAB — BASIC METABOLIC PANEL
Anion gap: 17 — ABNORMAL HIGH (ref 5–15)
Anion gap: 12 (ref 5–15)
BUN: 123 mg/dL — ABNORMAL HIGH (ref 8–23)
BUN: 131 mg/dL — ABNORMAL HIGH (ref 8–23)
CO2: 18 mmol/L — ABNORMAL LOW (ref 22–32)
CO2: 20 mmol/L — ABNORMAL LOW (ref 22–32)
Calcium: 7.5 mg/dL — ABNORMAL LOW (ref 8.9–10.3)
Calcium: 7.6 mg/dL — ABNORMAL LOW (ref 8.9–10.3)
Chloride: 114 mmol/L — ABNORMAL HIGH (ref 98–111)
Chloride: 118 mmol/L — ABNORMAL HIGH (ref 98–111)
Creatinine, Ser: 3.74 mg/dL — ABNORMAL HIGH (ref 0.44–1.00)
Creatinine, Ser: 3.81 mg/dL — ABNORMAL HIGH (ref 0.44–1.00)
GFR calc Af Amer: 13 mL/min — ABNORMAL LOW (ref >60)
GFR calc Af Amer: 12 mL/min — ABNORMAL LOW (ref >60)
GFR calc non Af Amer: 11 mL/min — ABNORMAL LOW (ref >60)
GFR calc non Af Amer: 11 mL/min — ABNORMAL LOW (ref >60)
Glucose, Bld: 385 mg/dL — ABNORMAL HIGH (ref 70–99)
Glucose, Bld: 382 mg/dL — ABNORMAL HIGH (ref 70–99)
Potassium: 3.7 mmol/L (ref 3.5–5.1)
Potassium: 3.2 mmol/L — ABNORMAL LOW (ref 3.5–5.1)
Sodium: 149 mmol/L — ABNORMAL HIGH (ref 135–145)
Sodium: 150 mmol/L — ABNORMAL HIGH (ref 135–145)

## 2019-05-29 LAB — URINALYSIS, COMPLETE (UACMP) WITH MICROSCOPIC
Bacteria, UA: NONE SEEN
Bilirubin Urine: NEGATIVE
Glucose, UA: 150 mg/dL — AB
Ketones, ur: NEGATIVE mg/dL
Leukocytes,Ua: NEGATIVE
Nitrite: NEGATIVE
Protein, ur: NEGATIVE mg/dL
Specific Gravity, Urine: 1.023 (ref 1.005–1.030)
pH: 5 (ref 5.0–8.0)

## 2019-05-29 LAB — TECHNOLOGIST SMEAR REVIEW

## 2019-05-29 LAB — RETICULOCYTES
Immature Retic Fract: 44.3 % — ABNORMAL HIGH (ref 2.3–15.9)
RBC.: 3.71 MIL/uL — ABNORMAL LOW (ref 3.87–5.11)
Retic Count, Absolute: 66.4 10*3/uL (ref 19.0–186.0)
Retic Ct Pct: 1.8 % (ref 0.4–3.1)

## 2019-05-29 LAB — COMPREHENSIVE METABOLIC PANEL
ALT: 35 U/L (ref 0–44)
AST: 29 U/L (ref 15–41)
Albumin: 2.1 g/dL — ABNORMAL LOW (ref 3.5–5.0)
Alkaline Phosphatase: 79 U/L (ref 38–126)
Anion gap: 18 — ABNORMAL HIGH (ref 5–15)
BUN: 125 mg/dL — ABNORMAL HIGH (ref 8–23)
CO2: 19 mmol/L — ABNORMAL LOW (ref 22–32)
Calcium: 7.8 mg/dL — ABNORMAL LOW (ref 8.9–10.3)
Chloride: 113 mmol/L — ABNORMAL HIGH (ref 98–111)
Creatinine, Ser: 3.75 mg/dL — ABNORMAL HIGH (ref 0.44–1.00)
GFR calc Af Amer: 13 mL/min — ABNORMAL LOW (ref >60)
GFR calc non Af Amer: 11 mL/min — ABNORMAL LOW (ref >60)
Glucose, Bld: 422 mg/dL — ABNORMAL HIGH (ref 70–99)
Potassium: 3.3 mmol/L — ABNORMAL LOW (ref 3.5–5.1)
Sodium: 150 mmol/L — ABNORMAL HIGH (ref 135–145)
Total Bilirubin: 0.9 mg/dL (ref 0.3–1.2)
Total Protein: 5.4 g/dL — ABNORMAL LOW (ref 6.5–8.1)

## 2019-05-29 LAB — PROTIME-INR
INR: 8.2 (ref 0.8–1.2)
INR: 2 — ABNORMAL HIGH (ref 0.8–1.2)
Prothrombin Time: 66.9 seconds — ABNORMAL HIGH (ref 11.4–15.2)
Prothrombin Time: 22 seconds — ABNORMAL HIGH (ref 11.4–15.2)

## 2019-05-29 LAB — GLUCOSE, CAPILLARY
Glucose-Capillary: 334 mg/dL — ABNORMAL HIGH (ref 70–99)
Glucose-Capillary: 346 mg/dL — ABNORMAL HIGH (ref 70–99)
Glucose-Capillary: 400 mg/dL — ABNORMAL HIGH (ref 70–99)
Glucose-Capillary: 296 mg/dL — ABNORMAL HIGH (ref 70–99)
Glucose-Capillary: 292 mg/dL — ABNORMAL HIGH (ref 70–99)

## 2019-05-29 LAB — NA AND K (SODIUM & POTASSIUM), RAND UR
Potassium Urine: 55 mmol/L
Sodium, Ur: <10 mmol/L

## 2019-05-29 LAB — LIPASE, BLOOD: Lipase: 32 U/L (ref 11–51)

## 2019-05-29 LAB — FIBRINOGEN: Fibrinogen: 680 mg/dL — ABNORMAL HIGH (ref 210–475)

## 2019-05-29 LAB — HEMOGLOBIN A1C
Hgb A1c MFr Bld: 7.4 % — ABNORMAL HIGH (ref 4.8–5.6)
Mean Plasma Glucose: 165.68 mg/dL

## 2019-05-29 LAB — LACTIC ACID, PLASMA: Lactic Acid, Venous: 1.8 mmol/L (ref 0.5–1.9)

## 2019-05-29 LAB — BETA-HYDROXYBUTYRIC ACID: Beta-Hydroxybutyric Acid: 0.95 mmol/L — ABNORMAL HIGH (ref 0.05–0.27)

## 2019-05-29 LAB — OSMOLALITY: Osmolality: 370 mOsm/kg (ref 275–295)

## 2019-05-29 LAB — SARS CORONAVIRUS 2 BY RT PCR (HOSPITAL ORDER, PERFORMED IN ~~LOC~~ HOSPITAL LAB): SARS Coronavirus 2: NEGATIVE

## 2019-05-29 LAB — CREATININE, URINE, RANDOM: Creatinine, Urine: 92.9 mg/dL

## 2019-05-29 LAB — OSMOLALITY, URINE: Osmolality, Ur: 452 mOsm/kg (ref 300–900)

## 2019-05-29 LAB — LACTATE DEHYDROGENASE: LDH: 217 U/L — ABNORMAL HIGH (ref 98–192)

## 2019-05-29 MED ORDER — AMIODARONE HCL IN DEXTROSE 360-4.14 MG/200ML-% IV SOLN
60.0000 mg/h | INTRAVENOUS | Status: AC
  Administered 2019-05-29 (×2): 60 mg/h via INTRAVENOUS
  Filled 2019-05-29 (×2): qty 200

## 2019-05-29 MED ORDER — INSULIN ASPART 100 UNIT/ML ~~LOC~~ SOLN
0.0000 [IU] | SUBCUTANEOUS | Status: DC
  Administered 2019-05-29: 11 [IU] via SUBCUTANEOUS

## 2019-05-29 MED ORDER — SODIUM CHLORIDE 0.9 % IV SOLN
80.0000 mg | Freq: Once | INTRAVENOUS | Status: AC
  Administered 2019-05-29: 80 mg via INTRAVENOUS
  Filled 2019-05-29: qty 80

## 2019-05-29 MED ORDER — ALBUMIN HUMAN 25 % IV SOLN
12.5000 g | Freq: Once | INTRAVENOUS | Status: AC
  Administered 2019-05-29: 12.5 g via INTRAVENOUS
  Filled 2019-05-29: qty 50

## 2019-05-29 MED ORDER — INSULIN ASPART 100 UNIT/ML ~~LOC~~ SOLN
0.0000 [IU] | SUBCUTANEOUS | Status: DC
  Administered 2019-05-29: 13:00:00 20 [IU] via SUBCUTANEOUS
  Administered 2019-05-29 (×2): 11 [IU] via SUBCUTANEOUS
  Administered 2019-05-30 (×5): 7 [IU] via SUBCUTANEOUS
  Administered 2019-05-31: 09:00:00 4 [IU] via SUBCUTANEOUS
  Administered 2019-05-31: 11 [IU] via SUBCUTANEOUS
  Administered 2019-05-31: 05:00:00 4 [IU] via SUBCUTANEOUS
  Administered 2019-05-31: 12:00:00 7 [IU] via SUBCUTANEOUS
  Administered 2019-05-31: 20:00:00 4 [IU] via SUBCUTANEOUS
  Administered 2019-05-31: 01:00:00 7 [IU] via SUBCUTANEOUS
  Administered 2019-06-01 (×2): 4 [IU] via SUBCUTANEOUS
  Administered 2019-06-01: 7 [IU] via SUBCUTANEOUS
  Administered 2019-06-01: 09:00:00 4 [IU] via SUBCUTANEOUS
  Administered 2019-06-01: 21:00:00 7 [IU] via SUBCUTANEOUS
  Administered 2019-06-02: 4 [IU] via SUBCUTANEOUS
  Administered 2019-06-02: 7 [IU] via SUBCUTANEOUS
  Administered 2019-06-02 (×2): 4 [IU] via SUBCUTANEOUS
  Administered 2019-06-02: 3 [IU] via SUBCUTANEOUS
  Administered 2019-06-02 – 2019-06-03 (×2): 4 [IU] via SUBCUTANEOUS
  Administered 2019-06-03: 7 [IU] via SUBCUTANEOUS
  Administered 2019-06-03: 3 [IU] via SUBCUTANEOUS
  Administered 2019-06-03 (×2): 4 [IU] via SUBCUTANEOUS
  Administered 2019-06-04: 7 [IU] via SUBCUTANEOUS
  Administered 2019-06-04: 3 [IU] via SUBCUTANEOUS
  Administered 2019-06-04 – 2019-06-05 (×4): 11 [IU] via SUBCUTANEOUS
  Administered 2019-06-05: 4 [IU] via SUBCUTANEOUS
  Administered 2019-06-05: 7 [IU] via SUBCUTANEOUS
  Administered 2019-06-05: 15 [IU] via SUBCUTANEOUS
  Administered 2019-06-05 – 2019-06-06 (×3): 4 [IU] via SUBCUTANEOUS
  Administered 2019-06-06: 7 [IU] via SUBCUTANEOUS
  Administered 2019-06-06: 4 [IU] via SUBCUTANEOUS
  Administered 2019-06-06: 11 [IU] via SUBCUTANEOUS
  Administered 2019-06-06: 4 [IU] via SUBCUTANEOUS
  Administered 2019-06-06: 11 [IU] via SUBCUTANEOUS
  Administered 2019-06-07: 3 [IU] via SUBCUTANEOUS
  Administered 2019-06-07: 4 [IU] via SUBCUTANEOUS
  Administered 2019-06-07: 3 [IU] via SUBCUTANEOUS
  Administered 2019-06-07: 11 [IU] via SUBCUTANEOUS
  Administered 2019-06-07: 15 [IU] via SUBCUTANEOUS
  Administered 2019-06-07: 3 [IU] via SUBCUTANEOUS
  Administered 2019-06-08: 7 [IU] via SUBCUTANEOUS
  Administered 2019-06-08 (×2): 4 [IU] via SUBCUTANEOUS
  Administered 2019-06-08: 11 [IU] via SUBCUTANEOUS
  Administered 2019-06-09: 3 [IU] via SUBCUTANEOUS
  Administered 2019-06-09: 4 [IU] via SUBCUTANEOUS
  Administered 2019-06-09: 3 [IU] via SUBCUTANEOUS
  Administered 2019-06-09: 4 [IU] via SUBCUTANEOUS
  Administered 2019-06-09: 7 [IU] via SUBCUTANEOUS
  Administered 2019-06-09 – 2019-06-10 (×3): 4 [IU] via SUBCUTANEOUS
  Administered 2019-06-10 (×3): 3 [IU] via SUBCUTANEOUS
  Administered 2019-06-10 – 2019-06-11 (×3): 4 [IU] via SUBCUTANEOUS
  Administered 2019-06-12 (×3): 3 [IU] via SUBCUTANEOUS

## 2019-05-29 MED ORDER — METHYLPREDNISOLONE SODIUM SUCC 40 MG IJ SOLR
40.0000 mg | Freq: Two times a day (BID) | INTRAMUSCULAR | Status: DC
  Administered 2019-05-29 – 2019-05-30 (×3): 40 mg via INTRAVENOUS
  Filled 2019-05-29 (×3): qty 1

## 2019-05-29 MED ORDER — AMIODARONE HCL IN DEXTROSE 360-4.14 MG/200ML-% IV SOLN
30.0000 mg/h | INTRAVENOUS | Status: DC
  Administered 2019-05-29 – 2019-06-02 (×8): 30 mg/h via INTRAVENOUS
  Filled 2019-05-29 (×12): qty 200

## 2019-05-29 MED ORDER — SODIUM CHLORIDE 0.9 % IV BOLUS
500.0000 mL | Freq: Once | INTRAVENOUS | Status: AC
  Administered 2019-05-29: 500 mL via INTRAVENOUS

## 2019-05-29 MED ORDER — VITAMIN K1 10 MG/ML IJ SOLN
5.0000 mg | Freq: Once | INTRAVENOUS | Status: AC
  Administered 2019-05-29: 5 mg via INTRAVENOUS
  Filled 2019-05-29 (×2): qty 0.5

## 2019-05-29 MED ORDER — SODIUM CHLORIDE 0.9 % IV SOLN
500.0000 mg | Freq: Two times a day (BID) | INTRAVENOUS | Status: DC
  Administered 2019-05-29 – 2019-06-05 (×15): 500 mg via INTRAVENOUS
  Filled 2019-05-29 (×17): qty 0.5

## 2019-05-29 MED ORDER — LACTATED RINGERS IV BOLUS
1000.0000 mL | Freq: Once | INTRAVENOUS | Status: AC
  Administered 2019-05-29: 1000 mL via INTRAVENOUS

## 2019-05-29 MED ORDER — ALBUMIN HUMAN 5 % IV SOLN
12.5000 g | Freq: Four times a day (QID) | INTRAVENOUS | Status: AC
  Administered 2019-05-29 – 2019-05-30 (×2): 12.5 g via INTRAVENOUS
  Filled 2019-05-29 (×2): qty 250

## 2019-05-29 MED ORDER — LACTATED RINGERS IV SOLN
INTRAVENOUS | Status: DC
  Administered 2019-05-29 – 2019-05-31 (×3): via INTRAVENOUS

## 2019-05-29 MED ORDER — PANTOPRAZOLE SODIUM 40 MG IV SOLR
40.0000 mg | Freq: Two times a day (BID) | INTRAVENOUS | Status: DC
  Administered 2019-05-29 – 2019-06-05 (×15): 40 mg via INTRAVENOUS
  Filled 2019-05-29 (×15): qty 40

## 2019-05-29 MED ORDER — LORAZEPAM 2 MG/ML IJ SOLN
0.5000 mg | Freq: Four times a day (QID) | INTRAMUSCULAR | Status: DC | PRN
  Administered 2019-05-29 – 2019-06-03 (×4): 0.5 mg via INTRAVENOUS
  Filled 2019-05-29 (×4): qty 1

## 2019-05-29 NOTE — Progress Notes (Signed)
Patient was alert, responding appropriately. Was taken off BIPAP and placed on breathing treatment. Vitals stable, no distress. Clear;diminished BBS. Placed patient on nasal cannula 4LPM. RT will make RN aware. RT will continue to monitor.

## 2019-05-29 NOTE — Progress Notes (Signed)
On call [facility] NP [place] text paged with pt's SBP in 70's and 80's (77/55, 88/52). Waiting on call back.

## 2019-05-29 NOTE — Progress Notes (Signed)
[facility] Progress Note  Patient: Melanie [name] PPJ:[id number]   PCP: [name], MD DOB: 1941-08-22   DOA: 05/22/2019   DOS: 05/29/2019   Date of Service: the patient was seen and examined on 05/29/2019  Brief hospital course: Pt. with PMH of type II DM, HTN, mild dementia per family; admitted on 05/22/2019, presented with complaint of epigastric abdominal pain nausea and vomiting, was found to have acute pancreatitis as well as sepsis of unknown origin. Currently further plan is continue current treatment.  Subjective: Overnight patient had significant breathing issues.  Received IV Lasix.  Chest x-ray was repeated.  After that her blood pressure dropped on IV Cardizem.  IV Cardizem was changed to IV amiodarone.  Patient received IV fluid bolus. At the time of my evaluation in the morning patient reports abdominal pain in the epigastric region.  No nausea no vomiting.  now has a rectal tube with black loose BM.  Foley catheter inserted as well with clear urine.  Assessment and Plan: 1. Sepsis POA, unknown etiology likely intra-abdominal source Anion gap metabolic acidosis Possible community-acquired versus aspiration pneumonia Patient received IV fluids appropriately.  Then became volume overloaded. Initially treated with IV vancomycin and Zosyn.  Transition to IV Unasyn. Now leukocytosis is worsening.  As well as respiration. Chest x-ray on 05/28/2019 shows moderate bilateral pleural effusion with consolidation ABG shows chronic compensated respiratory alkalosis along with metabolic acidosis and high anion gap No bowel sounds on examination. We will perform stat CT abdomen pelvis as well as chest CT. Continue to maintain n.p.o. for now. Unfortunately no blood cultures were performed on the time of admission.  Since the patient is not responding to IV Unasyn and IV Zosyn.  Will perform blood cultures as well as We will broaden the antibiotic coverage to IV meropenem. Will  have low threshold to contact CCM Hypotension last night was in response to antihypertensive medication. Patient also has acute endorgan damage with hyperbilirubinemia and acute kidney injury.  2.  Acute gallstone pancreatitis. Initial work-up at the time of admission with ultrasound abdomen was negative for any CBD dilatation or CBD stone. MRCP was performed which was also negative for any CBD dilatation or CBD stone. Eventually patient will require cholecystectomy but currently not stable to consider that for now in the presence of ongoing sepsis.  3.  Acute kidney injury. Likely multifactorial.  Initially patient was hypovolemic in the setting of pancreatitis.  Later on patient was hyperkalemic in the setting of resuscitation efforts. Blood pressure changes with accelerated hypertension at the time of admission as well as hypotension intermittently could also contribute to AKI. Overdiuresis could also be the etiology. Now the BUN is also elevated, concern is for GI bleed. We will start the patient on gentle IV LR infusion.  4.  Acute hypoxic respiratory distress. Acute on chronic diastolic CHF in the setting of hypervolemia from IV resuscitation Bilateral pleural effusion Community-acquired versus aspiration pneumonia  Multifactorial. Bilateral pleural effusions still present on chest x-ray last night. We will perform CT scan of the chest for further evaluation. Echocardiogram showed preserved EF without any significant wall motion abnormality or valvular abnormality. We will perform ABG only if there is any evidence of mental status changes.  Not for hypoxia. BiPAP PRN but for now holding secondary to hypotension.  And the patient is able to tolerate high flow nasal cannula for now. Already on IV antibiotics.  Added IV steroids on 05/28/2019. Continue Xopenex scheduled nebulization as well as Xopenex  as needed nebulization. Received multiple rounds of IV as needed Lasix.  5.   Possible GI bleed. Acute anemia.  Likely dilutional plus hemorrhagic Supratherapeutic INR. Constipation. Presents with hemoglobin of 16.  Aggressively resuscitated.  Hemoglobin around 14 on 05/28/2019. The initial drop is secondary to dilution.  Today the hemoglobin is 10.3. Concern is for possible GI bleed with melena. With normal platelets consult for hemolysis is less likely.  We will keep the patient n.p.o.  IV Protonix every 12 hours.  Recheck stat CBC as well as type and screen. INR 8.2.  Patient is on Coumadin at home but currently INR is trending up secondary to sepsis as well as inability to tolerate p.o. Vitamin K will be given as well. Recheck INR in the afternoon. CBC every 8 hours for now. We will consult GI if the H&H continues to drop. CT abdomen pelvis stat. FOBT x3.  6.  Chronic atrial fibrillation. With RVR. Heart rate currently in 120s. On oral Cardizem and metoprolol at home. Patient was initially on Cardizem drip. IV Lopressor was added this as needed which was changed to scheduled Lopressor. Due to persistent hypertension clonidine 0.1 mg clonidine patch was also added. Since the patient is placed on BiPAP her blood pressure has remained softer. Initially discontinue scheduled IV Lopressor. Now discontinued clonidine patch as well as IV Cardizem drip. Transitioning to IV amiodarone drip for now. We will consult cardiology for further assistance once other metabolic medical work-up is completed. Echocardiogram again showed preserved EF without any significant wall motion abnormalities.  7.  Acute metabolic encephalopathy. In the setting of sepsis. CT head unremarkable. EEG did not show any evidence of acute seizures and showed metabolic encephalopathy. Metabolic work-up including TSH free T4, ammonia, B12 level normal. Mentation improved significantly from the day of admission. UDS is also only showing opiates which the patient is actually receiving in the  hospital. Discussed with son patient does have some baseline memory deficit and may be having some mild delirium in the setting of sepsis. Continue to monitor.  8.  Type 2 diabetes mellitusPoorly controlled with hyperglycemia Hemoglobin A1c 7.5 in February 24, 2019. Currently hyperglycemia secondary to steroids. Patient is on sensitive sliding scale every 4 hours.  We will increase  coverage while the patient is on steroids. Patient does have anion gap acidosis may require IV insulin.  We will recheck further work-up for now before initiating therapy for DKA  9.  Dysphagia. Poor p.o. intake. May require CorPak tube placement. Currently speech therapy has been consulted.  The patient is unable to perform or participate in care secondary to respiratory distress as well as other medical conditions. Currently n.p.o. in the setting of GI bleed. Will monitor.  10.  Obesity. Body mass index is 35.16 kg/m.  Continue to monitor for now.  11.  Hypokalemia. Replacing IV for now.  Diet: NPO in the setting of acute pancreatitis as well as GI bleed and dysphagia DVT Prophylaxis: SCD, pharmacological prophylaxis contraindicated due to Supratherapeutic INR and possibility of GI bleed  Advance goals of care discussion: Full code  Family Communication: no family was present at bedside, at the time of interview.  Discussed with son and daughter on 05/29/2019.  Disposition:  Discharge to Home   Consultants: none Procedures: none  Scheduled Meds: . insulin aspart  0-9 Units Subcutaneous Q4H  . levalbuterol  0.63 mg Nebulization Q6H  . methylPREDNISolone (SOLU-MEDROL) injection  40 mg Intravenous Q6H  . sodium chloride flush  10-40 mL  Intracatheter Q12H   Continuous Infusions: . amiodarone 30 mg/hr (05/29/19 0650)  . ampicillin-sulbactam (UNASYN) IV 3 g (05/29/19 0444)  . lactated ringers 50 mL/hr at 05/29/19 0658  . phytonadione (VITAMIN K) IV     PRN Meds: acetaminophen **OR** acetaminophen,  HYDROmorphone (DILAUDID) injection, levalbuterol, LORazepam, metoprolol tartrate, ondansetron **OR** ondansetron (ZOFRAN) IV, sodium chloride flush Antibiotics: Anti-infectives (From admission, onward)   Start     Dose/Rate Route Frequency Ordered Stop   05/28/19 1700  Ampicillin-Sulbactam (UNASYN) 3 g in sodium chloride 0.9 % 100 mL IVPB     3 g 200 mL/hr over 30 Minutes Intravenous Every 12 hours 05/28/19 0735     05/27/19 1200  Ampicillin-Sulbactam (UNASYN) 3 g in sodium chloride 0.9 % 100 mL IVPB  Status:  Discontinued     3 g 200 mL/hr over 30 Minutes Intravenous Every 8 hours 05/27/19 1044 05/28/19 0735   05/23/19 1600  piperacillin-tazobactam (ZOSYN) IVPB 3.375 g  Status:  Discontinued     3.375 g 12.5 mL/hr over 240 Minutes Intravenous Every 8 hours 05/23/19 0933 05/27/19 1044   05/23/19 0800  piperacillin-tazobactam (ZOSYN) IVPB 3.375 g     3.375 g 100 mL/hr over 30 Minutes Intravenous  Once 05/23/19 0141 05/23/19 0911   05/23/19 0100  piperacillin-tazobactam (ZOSYN) IVPB 3.375 g     3.375 g 100 mL/hr over 30 Minutes Intravenous  Once 05/23/19 0059 05/23/19 0241       Objective: Physical Exam: Vitals:   05/29/19 0400 05/29/19 0423 05/29/19 0500 05/29/19 0600  BP: 112/74 126/86 127/70 111/79  Pulse: (!) 120 (!) 34 (!) 124 (!) 126  Resp: (!) 26 (!) 27 (!) 27 (!) 26  Temp:  97.9 F (36.6 C)    TempSrc:  Axillary    SpO2:  95% 95% 95%  Weight:      Height:        Intake/Output Summary (Last 24 hours) at 05/29/2019 0754 Last data filed at 05/29/2019 0600 Gross per 24 hour  Intake 975.11 ml  Output 500 ml  Net 475.11 ml   Filed Weights   05/22/19 2216 05/23/19 0355  Weight: 99.8 kg 92.9 kg   General: alert and oriented to time, place, and person. Appear in severe distress, affect appropriate Eyes: PERRL, Conjunctiva normal ENT: Oral Mucosa Clear, dry  Neck: difficult to assess  JVD, no Abnormal Mass Or lumps Cardiovascular: S1 and S2 Present, no Murmur, peripheral  pulses symmetrical Respiratory: Increased respiratory effort, Bilateral Air entry equal and Decreased, positive use of accessory muscle, bilateral basal crackles, no wheezes Abdomen: Bowel Sound absent, Soft and epigastric tenderness, no hernia Skin: no rashes  Extremities: no Pedal edema, no calf tenderness Neurologic: normal without focal findings, mental status, speech normal, alert and oriented x3, PERLA, Motor strength 5/5 and symmetric and sensation grossly normal to light touch Gait not checked due to patient safety concerns  Data Reviewed: CBC: Recent Labs  Lab 05/24/19 0634 05/25/19 0435 05/26/19 0814 05/27/19 0911 05/28/19 0249 05/29/19 0533  WBC 19.2* 15.0* 17.0* 17.5* 14.9* 23.9*  NEUTROABS 16.1* 13.5*  --  15.4*  --   --   HGB 16.2* 14.2 13.4 15.1* 14.6 10.3*  HCT 48.0* 42.8 39.6 44.6 43.5 31.3*  MCV 89.2 90.3 89.4 89.2 89.3 91.3  PLT 220 194 210 236 252 027   Basic Metabolic Panel: Recent Labs  Lab 05/25/19 0435 05/26/19 0814 05/27/19 0911 05/28/19 0249 05/29/19 0533  NA 141 145 148* 149* 149*  K 3.6 3.0* 2.9*  3.5 3.7  CL 109 113* 112* 114* 114*  CO2 21* 22 21* 19* 18*  GLUCOSE 181* 188* 200* 267* 385*  BUN 32* 25* 27* 48* 123*  CREATININE 1.35* 1.11* 1.17* 2.07* 3.74*  CALCIUM 8.0* 8.0* 8.0* 7.6* 7.5*  MG 1.7  --  1.8  --   --     Liver Function Tests: Recent Labs  Lab 05/23/19 0441 05/24/19 0634 05/25/19 0435 05/26/19 0814 05/27/19 0911  AST 31 45* 25 22 17   ALT 22 45* 29 24 23   ALKPHOS 42 38 43 63 72  BILITOT 1.1 1.4* 1.0 1.1 1.3*  PROT 6.2* 5.6* 5.6* 5.6* 6.0*  ALBUMIN 3.7 2.9* 2.4* 2.4* 2.5*   Recent Labs  Lab 05/22/19 2323 05/24/19 0634 05/25/19 1428  LIPASE 5,920* 373* 91*   Recent Labs  Lab 05/25/19 1428  AMMONIA 27   Coagulation Profile: Recent Labs  Lab 05/25/19 0435 05/26/19 0232 05/27/19 0250 05/28/19 0249 05/29/19 0533  INR 3.8* 4.0* 3.8* 5.1* 8.2*   Cardiac Enzymes: Recent Labs  Lab 05/22/19 2323   TROPONINI <0.03   BNP (last 3 results) No results for input(s): PROBNP in the last 8760 hours. CBG: Recent Labs  Lab 05/28/19 1142 05/28/19 1549 05/28/19 1955 05/28/19 2339 05/29/19 0426  GLUCAP 248* 283* 292* 299* 334*   Studies: Dg Chest Port 1 View  Result Date: 05/28/2019 CLINICAL DATA:  78 year old female with history of shortness of breath. EXAM: PORTABLE CHEST 1 VIEW COMPARISON:  Chest x-ray 05/27/2019. FINDINGS: Lung volumes are slightly low. Bibasilar opacities may reflect moderate bilateral pleural effusions with areas of atelectasis and/or consolidation throughout the lung bases bilaterally. No evidence of pulmonary edema. Mild cardiomegaly. Upper mediastinal contours are within normal limits. Aortic atherosclerosis. IMPRESSION: 1. Unchanged radiographic appearance the chest, as above. Electronically Signed   By: Vinnie Langton M.D.   On: 05/28/2019 21:50     Time spent: The patient is critically ill with multiple organ systems failure and requires high complexity decision making for assessment and support, frequent evaluation and titration of therapies. Critical Care Time devoted to patient care services described in this note is 35 minutes  Author: Berle Mull, MD [facility] 05/29/2019 7:54 AM  To reach On-call, see care teams to locate the attending and reach out to them via [email or web address]. If 7PM-7AM, please contact night-coverage If you still have difficulty reaching the attending provider, please page the [facility] (Director on Call) for [facility] on amion for assistance.

## 2019-05-29 NOTE — Progress Notes (Signed)
Pharmacy Antibiotic Note  Melanie [name] is a 78 y.o. female admitted on 05/22/2019 with acute pancreatitis and possible intra-abdominal infection. RUQ [place] and MRCP without evidence of acute chole or stone in CBD. Today is day 7 of antibiotics. Patient remains afebrile, but WBC is up to 23.9 (of note, patient is on steroids). Patient's SCr continues to rise and is up to 3.74. Pharmacy has been consulted to switch from Unasyn to meropenem.  Plan: -Start Meropenem 500 mg IV q12h -Stop Unasyn -Monitor renal function, clinical progress, and length of therapy   Height: 5\' 4"  (162.6 cm) Weight: 204 lb 12.9 oz (92.9 kg) IBW/kg (Calculated) : 54.7  Temp (24hrs), Avg:97.9 F (36.6 C), Min:97.5 F (36.4 C), Max:98.4 F (36.9 C)  Recent Labs  Lab 05/22/19 2323  05/23/19 0441 05/23/19 1514 05/24/19 0634 05/25/19 0435 05/25/19 1428 05/26/19 0814 05/27/19 0911 05/28/19 0249 05/29/19 0533  WBC 26.7*  --  13.6*  --  19.2* 15.0*  --  17.0* 17.5* 14.9* 23.9*  CREATININE 1.30*   < > 1.24*  --  1.66* 1.35*  --  1.11* 1.17* 2.07* 3.74*  LATICACIDVEN 2.3*  --  2.8* 3.7* 2.8*  --  2.0*  --   --   --   --    < > = values in this interval not displayed.    Estimated Creatinine Clearance: 13.9 mL/min (A) (by C-G formula based on SCr of 3.74 mg/dL (H)).    Allergies  Allergen Reactions  . Niacin And Related Rash  . Keflex [Cephalexin] Other (See Comments)    Fatigue, sore throat, body aches  . Sausage [Pickled Meat] Other (See Comments)    Drowsiness and dizziness  . Tape Rash    Bandaids    Antimicrobials this admission:  Zosyn 6/1 >> 6/5 Unasyn 6/5 >>6/7 Meropenem 6/7 >>  Dose adjustments this admission:  6/6: Unasyn 3 g IV q8h >> q12h  Microbiology results: no cultures this admission  Thank you for allowing pharmacy to be a part of this patient's care.  Jackson Latino, PharmD PGY1 Pharmacy Resident Phone [phone] 05/29/2019     7:58 AM

## 2019-05-29 NOTE — Progress Notes (Signed)
SLP Cancellation Note  Patient Details Name: GLADYSE CORVIN MRN: [id number] DOB: May 14, 1941   Cancelled treatment:       Reason Eval/Treat Not Completed: Medical issues which prohibited therapy; pt with increased O2 support on VM, per RN, would be more appropriate to check back for swallowing intervention tomorrow. SLP to continue to monitor for PO readiness     [place] MA, CCC-SLP 05/29/2019, 10:18 AM

## 2019-05-29 NOTE — Progress Notes (Signed)
CRITICAL VALUE ALERT  Critical Value:  INR 8.2  Date & Time Notied: 05/29/2019, 6004  Provider Notified: text paged DR. Berle Mull   Orders Received/Actions taken: waiting on orders. Passed on to day shift .

## 2019-05-29 NOTE — Progress Notes (Signed)
   Thoracentesis ordered Covid neg 6/1  Need test within 72 hours of aerosolized procedure  New test ordered for today RN aware  Will plan for thora 6/8

## 2019-05-29 NOTE — Progress Notes (Addendum)
LR started at 50cc/hr. Clonidine patch removed from left upper arm. Pt resting, AO x4. Follows command. Will pass on to day shift.

## 2019-05-29 NOTE — Progress Notes (Signed)
Call received from Arby Barrette NP, notified of pt's BP and no urine output since dose of lasix. Pt had 200 cc output right after foley was placed nothing after that. Also notified pt just had another large black liquid stool. I had stopped Cardizem drip when he called, HR was in 120's-130's. He said he will order some slow fluid bolus and amiodarone drip. Pt is alert and answers questions. RR in 20-30's in bi-pap. Will continue to monitor.

## 2019-05-29 NOTE — Progress Notes (Signed)
Placed on 50% VM, patient breathing out of mouth more than through nose.

## 2019-05-29 NOTE — Progress Notes (Signed)
  Amiodarone Drug - Drug Interaction Consult Note  Recommendations: 1. Pt on warfarin PTA-currently on hold-will need empiric dose reduction if pt remains on Amiodarone when warfarin is re-started   2. Monitor QTc with Seroquel/Zofran use   Amiodarone is metabolized by the cytochrome P450 system and therefore has the potential to cause many drug interactions. Amiodarone has an average plasma half-life of 50 days (range 20 to 100 days).   There is potential for drug interactions to occur several weeks or months after stopping treatment and the onset of drug interactions may be slow after initiating amiodarone.   []  Statins: Increased risk of myopathy. Simvastatin- restrict dose to 20mg  daily. Other statins: counsel patients to report any muscle pain or weakness immediately.  [x]  Anticoagulants: Amiodarone can increase anticoagulant effect. Consider warfarin dose reduction. Patients should be monitored closely and the dose of anticoagulant altered accordingly, remembering that amiodarone levels take several weeks to stabilize.  []  Antiepileptics: Amiodarone can increase plasma concentration of phenytoin, the dose should be reduced. Note that small changes in phenytoin dose can result in large changes in levels. Monitor patient and counsel on signs of toxicity.  []  Beta blockers: increased risk of bradycardia, AV block and myocardial depression. Sotalol - avoid concomitant use.  []   Calcium channel blockers (diltiazem and verapamil): increased risk of bradycardia, AV block and myocardial depression.  []   Cyclosporine: Amiodarone increases levels of cyclosporine. Reduced dose of cyclosporine is recommended.  []  Digoxin dose should be halved when amiodarone is started.  []  Diuretics: increased risk of cardiotoxicity if hypokalemia occurs.  []  Oral hypoglycemic agents (glyburide, glipizide, glimepiride): increased risk of hypoglycemia. Patient's glucose levels should be monitored closely when  initiating amiodarone therapy.   [x]  Drugs that prolong the QT interval:  Torsades de pointes risk may be increased with concurrent use - avoid if possible.  Monitor QTc, also keep magnesium/potassium WNL if concurrent therapy can't be avoided. [name] Antibiotics: e.g. fluoroquinolones, erythromycin. . Antiarrhythmics: e.g. quinidine, procainamide, disopyramide, sotalol. . Antipsychotics: e.g. phenothiazines, haloperidol.  . Lithium, tricyclic antidepressants, and methadone. Thank You,  Narda Bonds  05/29/2019 12:13 AM

## 2019-05-29 NOTE — Progress Notes (Signed)
Bolus started, BP right before Amiodarone drip started was 97/65. See MAR for documentation. @0100   BP dropped to 84/52. NS bolus running at 253ml/hr. Had conversation with Arby Barrette NP and updated on pt current situation. Will continue to monitor.

## 2019-05-30 ENCOUNTER — Inpatient Hospital Stay (HOSPITAL_COMMUNITY): Payer: Medicare HMO

## 2019-05-30 ENCOUNTER — Telehealth: Payer: Self-pay | Admitting: Family Medicine

## 2019-05-30 ENCOUNTER — Encounter (HOSPITAL_COMMUNITY): Payer: Self-pay | Admitting: Student

## 2019-05-30 DIAGNOSIS — K851 Biliary acute pancreatitis without necrosis or infection: Secondary | ICD-10-CM

## 2019-05-30 DIAGNOSIS — R935 Abnormal findings on diagnostic imaging of other abdominal regions, including retroperitoneum: Secondary | ICD-10-CM

## 2019-05-30 DIAGNOSIS — Z8719 Personal history of other diseases of the digestive system: Secondary | ICD-10-CM

## 2019-05-30 DIAGNOSIS — K921 Melena: Secondary | ICD-10-CM

## 2019-05-30 DIAGNOSIS — D62 Acute posthemorrhagic anemia: Secondary | ICD-10-CM

## 2019-05-30 HISTORY — PX: IR THORACENTESIS ASP PLEURAL SPACE W/IMG GUIDE: IMG5380

## 2019-05-30 LAB — BASIC METABOLIC PANEL
Anion gap: 12 (ref 5–15)
Anion gap: 12 (ref 5–15)
Anion gap: 17 — ABNORMAL HIGH (ref 5–15)
Anion gap: 13 (ref 5–15)
BUN: 127 mg/dL — ABNORMAL HIGH (ref 8–23)
BUN: 124 mg/dL — ABNORMAL HIGH (ref 8–23)
BUN: 117 mg/dL — ABNORMAL HIGH (ref 8–23)
BUN: 108 mg/dL — ABNORMAL HIGH (ref 8–23)
CO2: 20 mmol/L — ABNORMAL LOW (ref 22–32)
CO2: 20 mmol/L — ABNORMAL LOW (ref 22–32)
CO2: 18 mmol/L — ABNORMAL LOW (ref 22–32)
CO2: 20 mmol/L — ABNORMAL LOW (ref 22–32)
Calcium: 7.8 mg/dL — ABNORMAL LOW (ref 8.9–10.3)
Calcium: 7.9 mg/dL — ABNORMAL LOW (ref 8.9–10.3)
Calcium: 8.1 mg/dL — ABNORMAL LOW (ref 8.9–10.3)
Calcium: 8 mg/dL — ABNORMAL LOW (ref 8.9–10.3)
Chloride: 116 mmol/L — ABNORMAL HIGH (ref 98–111)
Chloride: 118 mmol/L — ABNORMAL HIGH (ref 98–111)
Chloride: 116 mmol/L — ABNORMAL HIGH (ref 98–111)
Chloride: 119 mmol/L — ABNORMAL HIGH (ref 98–111)
Creatinine, Ser: 3.63 mg/dL — ABNORMAL HIGH (ref 0.44–1.00)
Creatinine, Ser: 3.56 mg/dL — ABNORMAL HIGH (ref 0.44–1.00)
Creatinine, Ser: 3.26 mg/dL — ABNORMAL HIGH (ref 0.44–1.00)
Creatinine, Ser: 2.96 mg/dL — ABNORMAL HIGH (ref 0.44–1.00)
GFR calc Af Amer: 13 mL/min — ABNORMAL LOW (ref >60)
GFR calc Af Amer: 14 mL/min — ABNORMAL LOW (ref >60)
GFR calc Af Amer: 15 mL/min — ABNORMAL LOW (ref >60)
GFR calc Af Amer: 17 mL/min — ABNORMAL LOW (ref >60)
GFR calc non Af Amer: 11 mL/min — ABNORMAL LOW (ref >60)
GFR calc non Af Amer: 12 mL/min — ABNORMAL LOW (ref >60)
GFR calc non Af Amer: 13 mL/min — ABNORMAL LOW (ref >60)
GFR calc non Af Amer: 15 mL/min — ABNORMAL LOW (ref >60)
Glucose, Bld: 321 mg/dL — ABNORMAL HIGH (ref 70–99)
Glucose, Bld: 313 mg/dL — ABNORMAL HIGH (ref 70–99)
Glucose, Bld: 321 mg/dL — ABNORMAL HIGH (ref 70–99)
Glucose, Bld: 269 mg/dL — ABNORMAL HIGH (ref 70–99)
Potassium: 2.9 mmol/L — ABNORMAL LOW (ref 3.5–5.1)
Potassium: 3 mmol/L — ABNORMAL LOW (ref 3.5–5.1)
Potassium: 2.6 mmol/L — CL (ref 3.5–5.1)
Potassium: 3.1 mmol/L — ABNORMAL LOW (ref 3.5–5.1)
Sodium: 148 mmol/L — ABNORMAL HIGH (ref 135–145)
Sodium: 150 mmol/L — ABNORMAL HIGH (ref 135–145)
Sodium: 151 mmol/L — ABNORMAL HIGH (ref 135–145)
Sodium: 152 mmol/L — ABNORMAL HIGH (ref 135–145)

## 2019-05-30 LAB — AMYLASE, PLEURAL OR PERITONEAL FLUID: Amylase, Fluid: 37 U/L

## 2019-05-30 LAB — CBC
HCT: 26 % — ABNORMAL LOW (ref 36.0–46.0)
HCT: 26.4 % — ABNORMAL LOW (ref 36.0–46.0)
HCT: 28.1 % — ABNORMAL LOW (ref 36.0–46.0)
Hemoglobin: 8.6 g/dL — ABNORMAL LOW (ref 12.0–15.0)
Hemoglobin: 8.9 g/dL — ABNORMAL LOW (ref 12.0–15.0)
Hemoglobin: 9.4 g/dL — ABNORMAL LOW (ref 12.0–15.0)
MCH: 30.3 pg (ref 26.0–34.0)
MCH: 30.4 pg (ref 26.0–34.0)
MCH: 30.4 pg (ref 26.0–34.0)
MCHC: 33.1 g/dL (ref 30.0–36.0)
MCHC: 33.7 g/dL (ref 30.0–36.0)
MCHC: 33.5 g/dL (ref 30.0–36.0)
MCV: 91.5 fL (ref 80.0–100.0)
MCV: 90.1 fL (ref 80.0–100.0)
MCV: 90.9 fL (ref 80.0–100.0)
Platelets: 233 10*3/uL (ref 150–400)
Platelets: 238 10*3/uL (ref 150–400)
Platelets: 268 10*3/uL (ref 150–400)
RBC: 2.84 MIL/uL — ABNORMAL LOW (ref 3.87–5.11)
RBC: 2.93 MIL/uL — ABNORMAL LOW (ref 3.87–5.11)
RBC: 3.09 MIL/uL — ABNORMAL LOW (ref 3.87–5.11)
RDW: 15 % (ref 11.5–15.5)
RDW: 15 % (ref 11.5–15.5)
RDW: 15 % (ref 11.5–15.5)
WBC: 24.7 10*3/uL — ABNORMAL HIGH (ref 4.0–10.5)
WBC: 27.8 10*3/uL — ABNORMAL HIGH (ref 4.0–10.5)
WBC: 36.7 10*3/uL — ABNORMAL HIGH (ref 4.0–10.5)
nRBC: 1 % — ABNORMAL HIGH (ref 0.0–0.2)
nRBC: 1.3 % — ABNORMAL HIGH (ref 0.0–0.2)
nRBC: 1.3 % — ABNORMAL HIGH (ref 0.0–0.2)

## 2019-05-30 LAB — BODY FLUID CELL COUNT WITH DIFFERENTIAL
Lymphs, Fluid: 2 %
Monocyte-Macrophage-Serous Fluid: 53 % (ref 50–90)
Neutrophil Count, Fluid: 45 % — ABNORMAL HIGH (ref 0–25)
Total Nucleated Cell Count, Fluid: 155 cu mm (ref 0–1000)

## 2019-05-30 LAB — GRAM STAIN

## 2019-05-30 LAB — GLUCOSE, CAPILLARY
Glucose-Capillary: 209 mg/dL — ABNORMAL HIGH (ref 70–99)
Glucose-Capillary: 234 mg/dL — ABNORMAL HIGH (ref 70–99)
Glucose-Capillary: 236 mg/dL — ABNORMAL HIGH (ref 70–99)
Glucose-Capillary: 236 mg/dL — ABNORMAL HIGH (ref 70–99)
Glucose-Capillary: 242 mg/dL — ABNORMAL HIGH (ref 70–99)

## 2019-05-30 LAB — GLUCOSE, PLEURAL OR PERITONEAL FLUID: Glucose, Fluid: 275 mg/dL

## 2019-05-30 LAB — PATHOLOGIST SMEAR REVIEW

## 2019-05-30 LAB — PROTEIN, PLEURAL OR PERITONEAL FLUID: Total protein, fluid: <3 g/dL

## 2019-05-30 LAB — PROTIME-INR
INR: 1.4 — ABNORMAL HIGH (ref 0.8–1.2)
Prothrombin Time: 17.3 seconds — ABNORMAL HIGH (ref 11.4–15.2)

## 2019-05-30 LAB — LACTATE DEHYDROGENASE, PLEURAL OR PERITONEAL FLUID: LD, Fluid: 121 U/L — ABNORMAL HIGH (ref 3–23)

## 2019-05-30 LAB — OCCULT BLOOD X 1 CARD TO LAB, STOOL: Fecal Occult Bld: POSITIVE — AB

## 2019-05-30 MED ORDER — LIDOCAINE HCL (PF) 1 % IJ SOLN
INTRAMUSCULAR | Status: DC | PRN
  Administered 2019-05-30: 10 mL
  Administered 2019-05-31: 20 mL

## 2019-05-30 MED ORDER — METOPROLOL TARTRATE 5 MG/5ML IV SOLN
5.0000 mg | Freq: Four times a day (QID) | INTRAVENOUS | Status: DC | PRN
  Administered 2019-05-30 – 2019-06-13 (×3): 5 mg via INTRAVENOUS
  Filled 2019-05-30 (×3): qty 5

## 2019-05-30 MED ORDER — MAGNESIUM SULFATE 2 GM/50ML IV SOLN
2.0000 g | Freq: Once | INTRAVENOUS | Status: AC
  Administered 2019-05-30: 14:00:00 2 g via INTRAVENOUS
  Filled 2019-05-30: qty 50

## 2019-05-30 MED ORDER — LIDOCAINE HCL 1 % IJ SOLN
INTRAMUSCULAR | Status: AC
  Filled 2019-05-30: qty 20

## 2019-05-30 MED ORDER — POTASSIUM CHLORIDE 10 MEQ/100ML IV SOLN
10.0000 meq | INTRAVENOUS | Status: AC
  Administered 2019-05-30 (×3): 10 meq via INTRAVENOUS
  Filled 2019-05-30 (×3): qty 100

## 2019-05-30 MED ORDER — POTASSIUM CHLORIDE 10 MEQ/100ML IV SOLN
10.0000 meq | INTRAVENOUS | Status: AC
  Administered 2019-05-30 (×4): 10 meq via INTRAVENOUS
  Filled 2019-05-30 (×4): qty 100

## 2019-05-30 NOTE — Telephone Encounter (Signed)
Please advise? I'm not sure how to respond to this

## 2019-05-30 NOTE — Care Management Important Message (Signed)
Important Message  Patient Details  Name: Melanie [name] MRN: [id number] Date of Birth: [birth year]   Medicare Important Message Given:  Yes    [name] 05/30/2019, 12:05 PM

## 2019-05-30 NOTE — Progress Notes (Addendum)
[facility] Progress Note  Patient: Melanie [name]:[id number]   PCP: [name], MD DOB: [birth year]   DOA: 05/22/2019   DOS: 05/30/2019   Date of Service: the patient was seen and examined on 05/30/2019  Brief hospital course: Pt. with PMH of type II DM, HTN, mild dementia per family; admitted on 05/22/2019, presented with complaint of epigastric abdominal pain nausea and vomiting, was found to have acute pancreatitis as well as sepsis of unknown origin. Currently further plan is continue current treatment.  Subjective: Breathing is better.  Mentation better.  No chest pain abdominal pain.  No nausea no vomiting.  Still has black bowel movement.  Assessment and Plan: 1. Sepsis POA, unknown etiology likely intra-abdominal source Anion gap metabolic acidosis Possible community-acquired versus aspiration pneumonia Patient received IV fluids appropriately.  Then became volume overloaded. Initially treated with IV vancomycin and Zosyn.  Transition to IV Unasyn. Now leukocytosis is worsening.  As well as respiration. Chest x-ray on 05/28/2019 shows moderate bilateral pleural effusion with consolidation ABG shows chronic compensated respiratory alkalosis along with metabolic acidosis and high anion gap No bowel sounds on examination. We will perform stat CT abdomen pelvis as well as chest CT. Continue to maintain n.p.o. for now. Unfortunately no blood cultures were performed on the time of admission.  Since the patient is not responding to IV Unasyn and IV Zosyn.  Will perform blood cultures as well as We will broaden the antibiotic coverage to IV meropenem. Will have low threshold to contact CCM Hypotension last night was in response to antihypertensive medication. Patient also has acute endorgan damage with hyperbilirubinemia and acute kidney injury.  2.  Acute gallstone pancreatitis. Initial work-up at the time of admission with ultrasound abdomen was negative for any CBD  dilatation or CBD stone. MRCP was performed which was also negative for any CBD dilatation or CBD stone. Eventually patient will require cholecystectomy but currently not stable to consider that for now in the presence of ongoing sepsis.  3.  Acute kidney injury. Likely multifactorial.  Initially patient was hypovolemic in the setting of pancreatitis.  Later on patient was hyperkalemic in the setting of resuscitation efforts. Blood pressure changes with accelerated hypertension at the time of admission as well as hypotension intermittently could also contribute to AKI. Overdiuresis could also be the etiology. Now the BUN is also elevated, concern is for GI bleed. We will start the patient on gentle IV LR infusion.  4.  Acute hypoxic respiratory distress. Acute on chronic diastolic CHF in the setting of hypervolemia from IV resuscitation Bilateral pleural effusion Community-acquired versus aspiration pneumonia  Multifactorial. Bilateral pleural effusions still present on chest x-ray last night. We will perform CT scan of the chest for further evaluation. Echocardiogram showed preserved EF without any significant wall motion abnormality or valvular abnormality. We will perform ABG only if there is any evidence of mental status changes.  Not for hypoxia. BiPAP PRN but for now holding secondary to hypotension.  And the patient is able to tolerate high flow nasal cannula for now. Already on IV antibiotics.  Added IV steroids on 05/28/2019. Continue Xopenex scheduled nebulization as well as Xopenex as needed nebulization. Received multiple rounds of IV as needed Lasix.  5.  Possible GI bleed. Acute blood loss anemia.  Likely dilutional plus hemorrhagic Supratherapeutic INR. Constipation. Presents with hemoglobin of 16.  Aggressively resuscitated.  Hemoglobin around 14 on 05/28/2019. The initial drop is secondary to dilution.  Today the hemoglobin is 8.9 but  stable x2 Concern is for possible GI  bleed with melena. With normal platelets hemolysis is less likely.  CT abdomen pelvis negative for any active bleeding.   Appreciate GI input.  We will keep the patient n.p.o.  IV Protonix every 12 hours. INR elevated patient is on Coumadin at home INR is trending up secondary to sepsis as well as inability to tolerate p.o. Vitamin K will be given as well.  6.  Chronic atrial fibrillation. With RVR. Heart rate currently in 120s. On oral Cardizem and metoprolol at home. Patient was initially on Cardizem drip. IV Lopressor was added this as needed which was changed to scheduled Lopressor. Due to persistent hypertension clonidine 0.1 mg clonidine patch was also added. Since the patient is placed on BiPAP her blood pressure has remained softer. Initially discontinue scheduled IV Lopressor. Now discontinued clonidine patch as well as IV Cardizem drip. Transitioning to IV amiodarone drip for now. We will consult cardiology for further assistance once other metabolic medical work-up is completed. Echocardiogram again showed preserved EF without any significant wall motion abnormalities.  7.  Acute metabolic encephalopathy. In the setting of sepsis. CT head unremarkable. EEG did not show any evidence of acute seizures and showed metabolic encephalopathy. Metabolic work-up including TSH free T4, ammonia, B12 level normal. Mentation improved significantly from the day of admission. UDS is also only showing opiates which the patient is actually receiving in the hospital. Discussed with son patient does have some baseline memory deficit and may be having some mild delirium in the setting of sepsis. Continue to monitor.  8.  Type 2 diabetes mellitusPoorly controlled with hyperglycemia Hemoglobin A1c 7.5 in February 24, 2019. Currently hyperglycemia secondary to steroids. Patient is on sensitive sliding scale every 4 hours.  We will increase  coverage while the patient is on steroids. Patient does  have anion gap acidosis may require IV insulin.  We will recheck further work-up for now before initiating therapy for DKA  9.  Dysphagia. Poor p.o. intake. May require CorPak tube placement. Currently speech therapy has been consulted.  The patient is unable to perform or participate in care secondary to respiratory distress as well as other medical conditions. Currently n.p.o. in the setting of GI bleed. Will monitor.  10.  Obesity. Body mass index is 35.16 kg/m.  Continue to monitor for now.  11.  Hypokalemia. Replacing IV for now.  Diet: NPO in the setting of acute pancreatitis as well as GI bleed and dysphagia DVT Prophylaxis: SCD, pharmacological prophylaxis contraindicated due to Supratherapeutic INR and possibility of GI bleed  Advance goals of care discussion: Full code  Family Communication: no family was present at bedside, at the time of interview.  Discussed with son on 05/29/2019.  Attempt to reach out to son x2 on 05/30/2019.  Left voicemail.  Disposition:  Discharge to to be determined.    Consultants: gastroenterology, IR Procedures: none  Scheduled Meds: . insulin aspart  0-20 Units Subcutaneous Q4H  . levalbuterol  0.63 mg Nebulization Q6H  . methylPREDNISolone (SOLU-MEDROL) injection  40 mg Intravenous BID  . pantoprazole (PROTONIX) IV  40 mg Intravenous Q12H  . sodium chloride flush  10-40 mL Intracatheter Q12H   Continuous Infusions: . amiodarone 30 mg/hr (05/30/19 0547)  . lactated ringers 50 mL/hr at 05/30/19 1100  . meropenem (MERREM) IV 500 mg (05/30/19 1006)  . potassium chloride 10 mEq (05/30/19 1639)   PRN Meds: acetaminophen **OR** acetaminophen, HYDROmorphone (DILAUDID) injection, levalbuterol, lidocaine (PF), LORazepam, metoprolol tartrate, ondansetron **  OR** ondansetron (ZOFRAN) IV, sodium chloride flush Antibiotics: Anti-infectives (From admission, onward)   Start     Dose/Rate Route Frequency Ordered Stop   05/29/19 1000  meropenem  (MERREM) 500 mg in sodium chloride 0.9 % 100 mL IVPB     500 mg 200 mL/hr over 30 Minutes Intravenous Every 12 hours 05/29/19 0805     05/28/19 1700  Ampicillin-Sulbactam (UNASYN) 3 g in sodium chloride 0.9 % 100 mL IVPB  Status:  Discontinued     3 g 200 mL/hr over 30 Minutes Intravenous Every 12 hours 05/28/19 0735 05/29/19 0805   05/27/19 1200  Ampicillin-Sulbactam (UNASYN) 3 g in sodium chloride 0.9 % 100 mL IVPB  Status:  Discontinued     3 g 200 mL/hr over 30 Minutes Intravenous Every 8 hours 05/27/19 1044 05/28/19 0735   05/23/19 1600  piperacillin-tazobactam (ZOSYN) IVPB 3.375 g  Status:  Discontinued     3.375 g 12.5 mL/hr over 240 Minutes Intravenous Every 8 hours 05/23/19 0933 05/27/19 1044   05/23/19 0800  piperacillin-tazobactam (ZOSYN) IVPB 3.375 g     3.375 g 100 mL/hr over 30 Minutes Intravenous  Once 05/23/19 0141 05/23/19 0911   05/23/19 0100  piperacillin-tazobactam (ZOSYN) IVPB 3.375 g     3.375 g 100 mL/hr over 30 Minutes Intravenous  Once 05/23/19 0059 05/23/19 0241       Objective: Physical Exam: Vitals:   05/30/19 0953 05/30/19 1232 05/30/19 1600 05/30/19 1639  BP:  (!) 143/69 (!) 151/123 (!) 149/79  Pulse: (!) 29 (!) 119 (!) 126   Resp: (!) 23 18 20  (!) 25  Temp:  97.7 F (36.5 C) (!) 97.5 F (36.4 C)   TempSrc:  Oral Axillary   SpO2: 98% 99% 94%   Weight:      Height:        Intake/Output Summary (Last 24 hours) at 05/30/2019 1718 Last data filed at 05/30/2019 1300 Gross per 24 hour  Intake 1561.84 ml  Output 2600 ml  Net -1038.16 ml   Filed Weights   05/22/19 2216 05/23/19 0355  Weight: 99.8 kg 92.9 kg   General: alert and oriented to time, place, and person. Appear in severe distress, affect appropriate Eyes: PERRL, Conjunctiva normal ENT: Oral Mucosa Clear, dry  Neck: difficult to assess  JVD, no Abnormal Mass Or lumps Cardiovascular: S1 and S2 Present, no Murmur, peripheral pulses symmetrical Respiratory: Increased respiratory effort,  Bilateral Air entry equal and Decreased, positive use of accessory muscle, bilateral basal crackles, no wheezes Abdomen: Bowel Sound absent, Soft and epigastric tenderness, no hernia Skin: no rashes  Extremities: no Pedal edema, no calf tenderness Neurologic: normal without focal findings, mental status, speech normal, alert and oriented x3, PERLA, Motor strength 5/5 and symmetric and sensation grossly normal to light touch Gait not checked due to patient safety concerns  Data Reviewed: CBC: Recent Labs  Lab 05/24/19 0634 05/25/19 0435  05/27/19 0911  05/29/19 1035 05/29/19 1617 05/29/19 2315 05/30/19 0500 05/30/19 1130  WBC 19.2* 15.0*   < > 17.5*   < > 31.0* 29.3* 26.9* 24.7* 27.8*  NEUTROABS 16.1* 13.5*  --  15.4*  --  27.2*  --   --   --   --   HGB 16.2* 14.2   < > 15.1*   < > 11.1* 10.8* 9.2* 8.6* 8.9*  HCT 48.0* 42.8   < > 44.6   < > 33.9* 32.3* 27.4* 26.0* 26.4*  MCV 89.2 90.3   < > 89.2   < >  91.4 90.5 90.4 91.5 90.1  PLT 220 194   < > 236   < > 303 268 242 233 238   < > = values in this interval not displayed.   Basic Metabolic Panel: Recent Labs  Lab 05/25/19 0435  05/27/19 0911  05/29/19 1035 05/29/19 1617 05/29/19 2315 05/30/19 0500 05/30/19 1130  NA 141   < > 148*   < > 150* 150* 148* 150* 151*  K 3.6   < > 2.9*   < > 3.3* 3.2* 2.9* 3.0* 2.6*  CL 109   < > 112*   < > 113* 118* 116* 118* 116*  CO2 21*   < > 21*   < > 19* 20* 20* 20* 18*  GLUCOSE 181*   < > 200*   < > 422* 382* 321* 313* 321*  BUN 32*   < > 27*   < > 125* 131* 127* 124* 117*  CREATININE 1.35*   < > 1.17*   < > 3.75* 3.81* 3.63* 3.56* 3.26*  CALCIUM 8.0*   < > 8.0*   < > 7.8* 7.6* 7.8* 7.9* 8.1*  MG 1.7  --  1.8  --   --   --   --   --   --    < > = values in this interval not displayed.    Liver Function Tests: Recent Labs  Lab 05/24/19 0634 05/25/19 0435 05/26/19 0814 05/27/19 0911 05/29/19 1035  AST 45* 25 22 17 29   ALT 45* 29 24 23  35  ALKPHOS 38 43 63 72 79  BILITOT 1.4* 1.0  1.1 1.3* 0.9  PROT 5.6* 5.6* 5.6* 6.0* 5.4*  ALBUMIN 2.9* 2.4* 2.4* 2.5* 2.1*   Recent Labs  Lab 05/24/19 0634 05/25/19 1428 05/29/19 1035  LIPASE 373* 91* 32   Recent Labs  Lab 05/25/19 1428  AMMONIA 27   Coagulation Profile: Recent Labs  Lab 05/27/19 0250 05/28/19 0249 05/29/19 0533 05/29/19 1617 05/30/19 0500  INR 3.8* 5.1* 8.2* 2.0* 1.4*   Cardiac Enzymes: No results for input(s): CKTOTAL, CKMB, CKMBINDEX, TROPONINI in the last 168 hours. BNP (last 3 results) No results for input(s): PROBNP in the last 8760 hours. CBG: Recent Labs  Lab 05/29/19 1959 05/30/19 0001 05/30/19 0413 05/30/19 1112 05/30/19 1625  GLUCAP 292* 236* 236* 242* 234*   Studies: Dg Chest 1 View  Result Date: 05/30/2019 CLINICAL DATA:  Status post right thoracentesis EXAM: CHEST  1 VIEW COMPARISON:  05/28/2019 FINDINGS: Cardiac shadow is stable. Aortic calcifications are again seen. Mild right basilar atelectasis is noted although resolution of right-sided pleural effusion is seen. No pneumothorax is noted. Persistent left-sided effusion is seen. IMPRESSION: No pneumothorax following right thoracentesis. Stable left pleural effusion is noted. Electronically Signed   By: Inez Catalina M.D.   On: 05/30/2019 09:43   Ir Thoracentesis Asp Pleural Space W/img Guide  Result Date: 05/30/2019 INDICATION: Patient with history of acute on chronic diastolic HF, acute hypoxic respiratory distress, and bilateral pleural effusions (R>L). Request is made for diagnostic and therapeutic right thoracentesis. EXAM: ULTRASOUND GUIDED DIAGNOSTIC AND THERAPEUTIC RIGHT THORACENTESIS MEDICATIONS: 15 mL 1% lidocaine COMPLICATIONS: None immediate. PROCEDURE: An ultrasound guided thoracentesis was thoroughly discussed with the patient and questions answered. The benefits, risks, alternatives and complications were also discussed. The patient understands and wishes to proceed with the procedure. Written consent was obtained.  Ultrasound was performed to localize and mark an adequate pocket of fluid in the right chest. The area was then  prepped and draped in the normal sterile fashion. 1% Lidocaine was used for local anesthesia. Under ultrasound guidance a 6 Fr Safe-T-Centesis catheter was introduced. Thoracentesis was performed. The catheter was removed and a dressing applied. FINDINGS: A total of approximately 1.2 L of hazy gold fluid was removed. Samples were sent to the laboratory as requested by the clinical team. IMPRESSION: Successful ultrasound guided right thoracentesis yielding 1.2 L of pleural fluid. Read by: Earley Abide, PA-C Electronically Signed   By: Jerilynn Mages.  Shick M.D.   On: 05/30/2019 09:56     Time spent: 35 minutes  Author: Berle Mull, MD [facility] 05/30/2019 5:18 PM  To reach On-call, see care teams to locate the attending and reach out to them via [email or web address]. If 7PM-7AM, please contact night-coverage If you still have difficulty reaching the attending provider, please page the [facility] (Director on Call) for [facility] on amion for assistance.

## 2019-05-30 NOTE — Consult Note (Addendum)
° °                                                                          [facility] Consult: °12:56 PM °05/30/2019 ° LOS: 7 days  ° ° °Referring Provider: Dr Pranav Patel  °Primary Care Physician:  Tabori, Katherine E, MD °Primary Gastroenterologist:  Dr. Danis ° ° °Reason for Consultation:  GIB with melena and blood loss anemia °  °HPI: Melanie [name] is a 77 y.o. female.  Hx NIDDM.  Chronic Coumadin for A. Fib (previous Eliquis but could not afford).  Chronic diarrhea.   °HP polyps on colonoscopy 2013.   °08/2015 EGD.  For dysphagia.  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 4cm HH.   °03/2018 Colonoscopy.  For diarrhea.  Decreased rectal sphincter tone.  Left sided diverticulosis.  Redundant colon.  Four, 4 to 6 mm polyps (tubular adenomas without HGD) in distal sigmoid colon, proximal descending, ascending colon, removed with a cold snare. Normal mucosa in the entire examined colon, random biopsies negative for lymphocytic/microscopic colitis. °Stool elastase study 06/2018 normal. Stool path panel 06/2018 showed Blastocystic species, was treated with 10 d oral Metronidazole without improvement in diarrhea, so Dr Danis trialed on Dicyclomine.  Diarrhea did not improve on 1 week Metformin holiday.  He questioned a dx of diabetic diarrhea, dysmotility.   IgA, TTG both negative in 10/2018.   °Takes Omeprazole 20 mg/day and generic Align type pro-biotic.  .   ° °Now admitted as of 05/22/19 with acute, presumed biliary pancreatitis. Severe acute abdominal pain, n/v. Sepsis. Bil pleural effusion, thoracentesis today, 1.2 liters fluid removed.  ? CAP vs aspiration PNA.  abx of vanc/zosyn>> Unasyn >> meropenem.  Encephalopathy.   °05/23/19 abd ultrasound: Cholelithiasis, no cholecystitis.   °05/23/19 CTAP with contrast, #1: acute pancreatitis, no pancreatic fluid collection.  Gallstones.  Normal CBD.  °05/23/19 MRCP:  Acute pancreatitis with increase in peripancreatic fluid along the pericolic gutters. No organized collections. No pancreatic necrosis.  No etiology of pancreatitis. Several gallstones within the gallbladder but no cholelithiasis (??, did radiologist mean cholecystitis??). No biliary obstruction.  Findings not typical of autoimmune pancreatitis. Bilateral pleural effusions, basilar atelectasis and moderate °hiatal hernia.   °05/29/19 CTAP without contrast, #2:  Pleural effusions, bil LL ATX.  HH with fluid up to mid/distal esophagus concerning for reflux.  CM.  Aortic atherosclerosis. Persistent acute pancreatitis, slightly increased inflammation at tail.  No necrosis or pseudocyst.  Improved secondary duodenal inflammation.  Cholelithiasis.     ° °Lipase 5920 >> 32 °Mild elevation T bili to 1.4, AST/ALT to 45/45 which have all resolved. Normal alk phos. ° °Has not been evaluated by general surgery or GI for pancreatitis.     ° °Developed melenic liquid stool yesterday, seen in the flexi-seal.  It is FOBT +.  °Hgb 16 to 13 by day 3, back up to 14.6 day 7. Has drifted from 10.8 >> 8.6 >> 8.9 in last 24 hours.   °INR up to 8.2 on 6/7, 1.4 today after Vit K 5 mg IV once.   ° °Day 2 Protonix 40 IV.  Was not getting her home dose of Omeprazole 20 mg/day until yesterday.   °BP as low as   88/50 at noon yesterday with normal or hypertensive BPs today. Pulses as high as the 130s but generally in the low 100s to 1 teens today.   °Was on Venturi mask this AM, removed and on Maud oxygen since paracentesis this AM.   ° °Pt c/o dry mouth from being NPO and denies abd pain, nausea/vomiting as was present last week.  No ETOH at home.   ° ° °  ° °Past Medical History:  °Diagnosis Date  °• Allergic rhinitis   °• Anticoagulated on Coumadin   °• Arthritis   °• Diabetes mellitus   °• GERD (gastroesophageal reflux disease)   °• Hyperlipidemia   °• Hypertension   °• Pain in joint, ankle and foot 09/14/2013  ° ° °Past Surgical History:    °Procedure Laterality Date  °• COLONOSCOPY    ° 03/2018, removed polyps  °• FRACTURE SURGERY Left   °  leg  °• hiatal hernia surgery  2010  °• IR THORACENTESIS ASP PLEURAL SPACE W/IMG GUIDE  05/30/2019  °• ROTATOR CUFF REPAIR Right 2010  °• TONSILLECTOMY    ° ° °Prior to Admission medications   °Medication Sig Start Date End Date Taking? Authorizing Provider  °Ascorbic Acid (VITAMIN C) 1000 MG tablet Take 1,000 mg by mouth 2 (two) times daily.    [provider]  °Calcium Carb-Cholecalciferol (CALTRATE 600+D3 SOFT PO) Take 1 tablet by mouth 2 (two) times daily.    [provider]  °CRANBERRY PO Take 2 tablets by mouth daily.    [provider]  °Cyanocobalamin (VITAMIN B-12) 1000 MCG SUBL Place 1 each under the tongue daily.    [provider]  °diltiazem (CARDIZEM) 90 MG tablet TAKE 1 TABLET (90 MG TOTAL) BY MOUTH 2 (TWO) TIMES DAILY. 04/07/19   Tabori, Katherine E, MD  °fish oil-omega-3 fatty acids 1000 MG capsule Take 2 g by mouth daily.    [provider]  °fluticasone (FLONASE) 50 MCG/ACT nasal spray Place 2 sprays into both nostrils daily as needed for allergies or rhinitis.    [provider]  °furosemide (LASIX) 20 MG tablet TAKE 1 TABLET BY MOUTH EVERY DAY °Patient taking differently: Take 20 mg by mouth daily.  02/01/19   Tabori, Katherine E, MD  °hydrocortisone 2.5 % cream APPLY TO THE FACE TWICE A DAY AS NEEDED 1 WEEK ON THEN 1 WEEK OFF 04/12/18   [provider]  °lisinopril-hydrochlorothiazide (PRINZIDE,ZESTORETIC) 20-12.5 MG tablet TAKE 1 TABLET BY MOUTH EVERY DAY °Patient taking differently: Take 1 tablet by mouth daily.  02/01/19   Tabori, Katherine E, MD  °loperamide (IMODIUM A-D) 2 MG capsule Take by mouth as needed for diarrhea or loose stools.    [provider]  °metFORMIN (GLUCOPHAGE) 500 MG tablet TAKE 1 TABLET (500 MG TOTAL) BY MOUTH 2 (TWO) TIMES DAILY WITH A MEAL. 12/27/18   Tabori, Katherine E, MD  °metoprolol tartrate  (LOPRESSOR) 25 MG tablet Take 1.5 tablets (37.5 mg total) by mouth 2 (two) times daily. °Patient taking differently: Take 25 mg by mouth 2 (two) times daily.  02/22/19   Carroll, Donna C, NP  °omeprazole (PRILOSEC) 20 MG capsule TAKE 1 CAPSULE BY MOUTH EVERY DAY °Patient taking differently: Take 20 mg by mouth daily.  03/21/19   Tabori, Katherine E, MD  °pravastatin (PRAVACHOL) 40 MG tablet Take 1 tablet (40 mg total) by mouth daily. 02/24/19   Tabori, Katherine E, MD  °Probiotic Product (PROBIOTIC PO) Take 1 capsule by mouth daily.       [provider]  °sulfamethoxazole-trimethoprim (BACTRIM DS) 800-160 MG tablet Take 1 tablet by mouth 2 (two) times daily. 04/02/19   Martin, Mary-Margaret, FNP  °vitamin E 100 UNIT capsule Take by mouth.    [provider]  °warfarin (COUMADIN) 5 MG tablet TAKE AS DIRECTED BY COUMADIN CLINIC °Patient taking differently: Take 5-7.5 mg by mouth See admin instructions. Take 1 1/2 tablet (7.5 mg) by mouth on Monday, Wednesday, Friday, take 1 tablet (5 mg) on Sunday, Tuesday, Thursday, Saturday - or as directed by Coumadin Clinic 03/25/19   Cooper, Michael, MD  ° ° °Scheduled Meds: °• insulin aspart  0-20 Units Subcutaneous Q4H  °• levalbuterol  0.63 mg Nebulization Q6H  °• methylPREDNISolone (SOLU-MEDROL) injection  40 mg Intravenous BID  °• pantoprazole (PROTONIX) IV  40 mg Intravenous Q12H  °• sodium chloride flush  10-40 mL Intracatheter Q12H  ° °Infusions: °• amiodarone 30 mg/hr (05/30/19 0547)  °• lactated ringers 50 mL/hr at 05/29/19 1430  °• meropenem (MERREM) IV 500 mg (05/30/19 1006)  °• potassium chloride 10 mEq (05/30/19 1142)  ° °PRN Meds: °acetaminophen **OR** acetaminophen, HYDROmorphone (DILAUDID) injection, levalbuterol, lidocaine (PF), LORazepam, metoprolol tartrate, ondansetron **OR** ondansetron (ZOFRAN) IV, sodium chloride flush ° ° °Allergies as of 05/22/2019 - Review Complete 05/22/2019  °Allergen Reaction Noted  °• Niacin and related Rash 01/14/2012  °•  Keflex [cephalexin] Other (See Comments) 05/26/2014  °• Sausage [pickled meat] Other (See Comments) 12/29/2016  °• Tape Rash 12/29/2016  ° ° °Family History  °Problem Relation Age of Onset  °• Colon cancer Brother 63  °• Colon cancer Maternal Aunt 50  °• Breast cancer Paternal Grandmother 60  °• Breast cancer Maternal Aunt 28  °• Diabetes Maternal Aunt   °• Heart disease Mother   °• Stomach cancer Neg Hx   ° ° °Social History  ° °Socioeconomic History  °• Marital status: Widowed  °  Spouse name: Not on file  °• Number of children: 3  °• Years of education: Not on file  °• Highest education level: Not on file  °Occupational History  °• Not on file  °Social Needs  °• Financial resource strain: Not on file  °• Food insecurity:  °  Worry: Not on file  °  Inability: Not on file  °• Transportation needs:  °  Medical: Not on file  °  Non-medical: Not on file  °Tobacco Use  °• Smoking status: Never Smoker  °• Smokeless tobacco: Never Used  °Substance and Sexual Activity  °• Alcohol use: No  °• Drug use: No  °• Sexual activity: Not on file  °Lifestyle  °• Physical activity:  °  Days per week: Not on file  °  Minutes per session: Not on file  °• Stress: Not on file  °Relationships  °• Social connections:  °  Talks on phone: Not on file  °  Gets together: Not on file  °  Attends religious service: Not on file  °  Active member of club or organization: Not on file  °  Attends meetings of clubs or organizations: Not on file  °  Relationship status: Not on file  °• Intimate partner violence:  °  Fear of current or ex partner: Not on file  °  Emotionally abused: Not on file  °  Physically abused: Not on file  °  Forced sexual activity: Not on file  °Other Topics Concern  °• Not on file  °Social History Narrative  °• Not on file  ° ° °  on file    Active member of club or organization: Not on file    Attends meetings of clubs or organizations: Not on file    Relationship status: Not on file   Intimate partner violence:    Fear of current or ex partner: Not on file    Emotionally abused: Not on file    Physically abused: Not on file    Forced sexual activity: Not on file  Other Topics Concern   Not on file  Social History Narrative   Not on file    REVIEW OF SYSTEMS: Constitutional: Generally sedentary but is able to walk up and down a 20 to 30 foot driveway a few times a week. ENT:  No nose bleeds Pulm: Currently still feels a bit short of  breath though this is improved following thoracentesis this morning. CV: No chest pain.  Patient does not feel her irregular heartbeat or tachycardia. GU:  No hematuria, no frequency GI: Per HPI. Heme: Other than the dark stools, has not had any unusual/excessive bleeding or bruising. Transfusions: No record of previous blood transfusions. Neuro:  No headaches, no peripheral tingling or numbness.  Nuys dizziness.  No history of seizures. Derm:  No itching, no sores. Endocrine:  No sweats or chills.  No polyuria or dysuria Immunization: Reviewed.  She is up-to-date on Pneumovax, flu shot Travel:  None beyond local counties in last few months.    PHYSICAL EXAM: Vital signs in last 24 hours: Vitals:   05/30/19 0953 05/30/19 1232  BP:  (!) 143/69  Pulse: (!) 29 (!) 119  Resp: (!) 23 18  Temp:  97.7 F (36.5 C)  SpO2: 98% 99%   Wt Readings from Last 3 Encounters:  05/23/19 92.9 kg  02/24/19 88.9 kg  02/22/19 88.5 kg    General: Obese, pleasant, ill-appearing WF. Head: No facial asymmetry or swelling.  No signs of head trauma.  Facial rubor on the cheeks. Eyes: No scleral icterus, no conjunctival pallor.  EOMI. Ears: No obvious hearing deficit. Nose: No congestion or discharge. Mouth: Dry mouth.  Tongue midline.  Mucosa pink, clear. Neck: No JVD, no masses, no thyromegaly. Lungs: + Cough, labored breathing.  Rales in both bases. Heart: Irregularly irregular, rate 120.  No MRG.  S1, S2 present. Abdomen: Soft.  Not tender or distended.  Obese.  No HSM, masses, bruits, hernias.  Bowel sounds active..   Rectal: DRE not performed.  There is a Flexi-Seal in place which contains about 150 to 200 mL's of very dark brown liquid stool.  Not burgundy. Musc/Skeltl: No obvious joint deformities other than some mild kyphosis. Extremities: Slight, nonpitting pedal edema.  Both the hands and feet are cool to the touch but not mottled or cyanotic. Neurologic: Alert.  Oriented to place, year, her  birthdate.  Follows commands.  No obvious limb weakness or tremors. Skin: Other than the erythema on her facial cheeks, no unusual or worrisome dermatologic findings. Tattoos: None Nodes: No cervical adenopathy. Psych: Calm, cooperative.  Intake/Output from previous day: 06/07 0701 - 06/08 0700 In: 1561.8 [I.V.:1043.5; IV Piggyback:518.3] Out: 800 [Urine:800] Intake/Output this shift: Total I/O In: 0  Out: 1800 [Urine:600; Other:1200]  LAB RESULTS: Recent Labs    05/29/19 2315 05/30/19 0500 05/30/19 1130  WBC 26.9* 24.7* 27.8*  HGB 9.2* 8.6* 8.9*  HCT 27.4* 26.0* 26.4*  PLT 242 233 238   BMET Lab Results  Component Value Date   NA 150 (H) 05/30/2019   NA 148 (H) 05/29/2019  CREATININE 3.56 (H) 05/30/2019  ° CREATININE 3.63 (H) 05/29/2019  ° CREATININE 3.81 (H) 05/29/2019  ° CALCIUM 7.9 (L) 05/30/2019  ° CALCIUM 7.8 (L) 05/29/2019  ° CALCIUM 7.6 (L) 05/29/2019  ° °LFT °Recent Labs  °  05/29/19 °1035  °PROT 5.4*  °ALBUMIN 2.1*  °AST 29  °ALT 35  °ALKPHOS 79  °BILITOT 0.9  ° °PT/INR °Lab Results  °Component Value Date  ° INR 1.4 (H) 05/30/2019  ° INR 2.0 (H) 05/29/2019  ° INR 8.2 (HH) 05/29/2019  ° °Hepatitis Panel °No results for input(s): HEPBSAG, HCVAB, HEPAIGM, HEPBIGM in the last 72 hours. °C-Diff °No components found for: CDIFF °Lipase  °   °Component Value Date/Time  ° LIPASE 32 05/29/2019 1035  ° ° °Drugs of Abuse  °   °Component Value Date/Time  ° LABOPIA POSITIVE (A) 05/26/2019 1800  ° COCAINSCRNUR NONE DETECTED 05/26/2019 1800  ° LABBENZ NONE DETECTED 05/26/2019 1800  °  AMPHETMU NONE DETECTED 05/26/2019 1800  ° THCU NONE DETECTED 05/26/2019 1800  ° LABBARB NONE DETECTED 05/26/2019 1800  °  ° °RADIOLOGY STUDIES: °Ct Abdomen Pelvis Wo Contrast ° °Result Date: 05/29/2019 °CLINICAL DATA:  77-year-old female currently mid with acute pancreatitis and sepsis of unknown origin. Significant breathing issues overnight as well as increasing epigastric abdominal pain, fever and leukocytosis. EXAM: CT CHEST, ABDOMEN AND PELVIS WITHOUT CONTRAST TECHNIQUE: Multidetector CT imaging of the chest, abdomen and pelvis was performed following the standard protocol without IV contrast. COMPARISON:  Most recent prior CT scan of the abdomen and pelvis 05/23/2019 FINDINGS: CT CHEST FINDINGS Cardiovascular: Limited evaluation in the absence of intravenous contrast. Atherosclerotic calcifications present along the thoracic aorta. Persistent biatrial enlargement. Calcification of the aortic valve and mitral valve annulus. No pericardial effusion. Mediastinum/Nodes: Unremarkable esophagus. No suspicious mediastinal mass or adenopathy. There is a moderate hiatal hernia. Fluid is present in the mid and distal esophagus. Lungs/Pleura: Moderate to large layering right pleural effusion and moderate layering left pleural effusion with associated bibasilar atelectasis. No evidence of pulmonary edema. The aerated portions of the lungs remain relatively clear. A few small scattered foci of ground-glass attenuation opacity likely reflect areas of trace atelectasis. There is mild atelectasis in the inferior aspect of the right middle lobe. Musculoskeletal: No acute fracture or aggressive appearing lytic or blastic osseous lesion. CT ABDOMEN PELVIS FINDINGS Hepatobiliary: Normal hepatic contour and morphology. No discrete hepatic lesion. No intra or extrahepatic biliary ductal dilatation. High attenuation material layers within the gallbladder lumen consistent with small stones. No gallbladder distention or pericholecystic  fluid. Pancreas: Similar appearance of the pancreas which is edematous and poorly defined consistent with pancreatitis. Perhaps slightly increased peripancreatic fluid and edema adjacent to the pancreatic tail compared to recent prior imaging. The remainder of the edema along the body and head appears similar. No new pseudocyst or gas to suggest pancreatic necrosis. Secondary inflammatory thickening of the first and second portions of the duodenum again noted. Incidental note is made of a duodenal diverticulum, unchanged. Spleen: Normal in size without focal abnormality. Adrenals/Urinary Tract: The adrenal glands are unremarkable. No evidence of hydronephrosis or nephrolithiasis. Stable simple right renal cyst. The ureters are unremarkable. A Foley catheter is present within the collapsed bladder. Stomach/Bowel: A rectal tube is present. Colonic diverticular disease without CT evidence of active inflammation. No evidence of bowel obstruction. Vascular/Lymphatic: Atherosclerotic calcifications throughout the abdominal aorta. No aneurysm. Limited evaluation in the absence of intravenous contrast. No suspicious mediastinal lymphadenopathy. Reproductive: Uterus and bilateral adnexa are unremarkable. Other:   Incidental note is made of a duodenal diverticulum, unchanged. Spleen: Normal in size without focal abnormality. Adrenals/Urinary Tract: The adrenal glands are unremarkable. No evidence of hydronephrosis or nephrolithiasis. Stable simple right renal cyst. The ureters are unremarkable. A Foley catheter is present within the collapsed bladder. Stomach/Bowel: A rectal tube is present. Colonic diverticular disease without CT evidence of active inflammation. No evidence of bowel obstruction. Vascular/Lymphatic: Atherosclerotic calcifications throughout the abdominal aorta. No aneurysm. Limited evaluation in the absence of intravenous contrast. No suspicious mediastinal lymphadenopathy. Reproductive: Uterus and bilateral adnexa are unremarkable. Other: No intraperitoneal ascites or intra-abdominal abscess collection identified. Musculoskeletal: No acute fracture or aggressive appearing lytic or blastic osseous lesion. Mild dependent body wall edema. IMPRESSION: CT CHEST 1. Moderate to large bilateral pleural effusions slightly larger on the right than the left with associated bilateral lower lobe atelectasis. This may be contributing to the patient's underlying shortness of breath. 2. Otherwise, the lungs are fairly clear. No evidence of significant pneumonia or pneumonitis. 3. Moderate hiatal hernia with fluid filling the mid and distal esophagus. This is concerning for reflux versus poor esophageal motility and could represent a potential aspiration risk. 4.  Persistent cardiomegaly with biatrial enlargement. 5.  Aortic Atherosclerosis (ICD10-170.0) CT ABD/PELVIS 1. Persistent findings of acute/subacute pancreatitis. Perhaps slightly more inflammatory stranding and fluid adjacent to the pancreatic tail but no new pseudocyst or findings to suggest pancreatic necrosis. 2. Persistent but perhaps slightly improved secondary inflammatory thickening of the proximal duodenum. 3. No evidence of abdominal ascites, abscess or other acute abnormality. 4. Cholelithiasis without imaging findings to suggest acute cholecystitis. 5. Rectal tube and Foley catheter in place. 6. Additional ancillary findings as above without significant interval change. Signed, Criselda Peaches, MD, [facility] Electronically Signed   By: Jacqulynn Cadet M.D.   On: 05/29/2019 09:03   Dg Chest 1 View  Result Date: 05/30/2019 CLINICAL DATA:  Status post right thoracentesis EXAM: CHEST  1 VIEW COMPARISON:  05/28/2019 FINDINGS: Cardiac shadow is stable. Aortic calcifications are again seen. Mild right basilar atelectasis is noted although resolution of right-sided pleural effusion is seen. No pneumothorax is noted. Persistent left-sided effusion is seen. IMPRESSION: No pneumothorax following right thoracentesis. Stable left pleural effusion is noted. Electronically Signed   By: Inez Catalina M.D.   On: 05/30/2019 09:43   Ct Chest Wo Contrast  Result Date: 05/29/2019 CLINICAL DATA:  78 year old female currently mid with acute pancreatitis and sepsis of unknown origin. Significant breathing issues overnight as well as increasing epigastric abdominal pain, fever and leukocytosis. EXAM: CT CHEST, ABDOMEN AND PELVIS WITHOUT CONTRAST TECHNIQUE: Multidetector CT imaging of the chest, abdomen and pelvis was performed following the standard protocol without IV contrast. COMPARISON:  Most recent prior CT scan of the abdomen and pelvis 05/23/2019  FINDINGS: CT CHEST FINDINGS Cardiovascular: Limited evaluation in the absence of intravenous contrast. Atherosclerotic calcifications present along the thoracic aorta. Persistent biatrial enlargement. Calcification of the aortic valve and mitral valve annulus. No pericardial effusion. Mediastinum/Nodes: Unremarkable esophagus. No suspicious mediastinal mass or adenopathy. There is a moderate hiatal hernia. Fluid is present in the mid and distal esophagus. Lungs/Pleura: Moderate to large layering right pleural effusion and moderate layering left pleural effusion with associated bibasilar atelectasis. No evidence of pulmonary edema. The aerated portions of the lungs remain relatively clear. A few small scattered foci of ground-glass attenuation opacity likely reflect areas of trace atelectasis. There is mild atelectasis in the inferior aspect of the right middle  lobe. Musculoskeletal: No acute fracture or aggressive appearing lytic or blastic osseous lesion. CT ABDOMEN PELVIS FINDINGS Hepatobiliary: Normal hepatic contour and morphology. No discrete hepatic lesion. No intra or extrahepatic biliary ductal dilatation. High attenuation material layers within the gallbladder lumen consistent with small stones. No gallbladder distention or pericholecystic fluid. Pancreas: Similar appearance of the pancreas which is edematous and poorly defined consistent with pancreatitis. Perhaps slightly increased peripancreatic fluid and edema adjacent to the pancreatic tail compared to recent prior imaging. The remainder of the edema along the body and head appears similar. No new pseudocyst or gas to suggest pancreatic necrosis. Secondary inflammatory thickening of the first and second portions of the duodenum again noted. Incidental note is made of a duodenal diverticulum, unchanged. Spleen: Normal in size without focal abnormality. Adrenals/Urinary Tract: The adrenal glands are unremarkable. No evidence of hydronephrosis or  nephrolithiasis. Stable simple right renal cyst. The ureters are unremarkable. A Foley catheter is present within the collapsed bladder. Stomach/Bowel: A rectal tube is present. Colonic diverticular disease without CT evidence of active inflammation. No evidence of bowel obstruction. Vascular/Lymphatic: Atherosclerotic calcifications throughout the abdominal aorta. No aneurysm. Limited evaluation in the absence of intravenous contrast. No suspicious mediastinal lymphadenopathy. Reproductive: Uterus and bilateral adnexa are unremarkable. Other: No intraperitoneal ascites or intra-abdominal abscess collection identified. Musculoskeletal: No acute fracture or aggressive appearing lytic or blastic osseous lesion. Mild dependent body wall edema. IMPRESSION: CT CHEST 1. Moderate to large bilateral pleural effusions slightly larger on the right than the left with associated bilateral lower lobe atelectasis. This may be contributing to the patient's underlying shortness of breath. 2. Otherwise, the lungs are fairly clear. No evidence of significant pneumonia or pneumonitis. 3. Moderate hiatal hernia with fluid filling the mid and distal esophagus. This is concerning for reflux versus poor esophageal motility and could represent a potential aspiration risk. 4. Persistent cardiomegaly with biatrial enlargement. 5.  Aortic Atherosclerosis (ICD10-170.0) CT ABD/PELVIS 1. Persistent findings of acute/subacute pancreatitis. Perhaps slightly more inflammatory stranding and fluid adjacent to the pancreatic tail but no new pseudocyst or findings to suggest pancreatic necrosis. 2. Persistent but perhaps slightly improved secondary inflammatory thickening of the proximal duodenum. 3. No evidence of abdominal ascites, abscess or other acute abnormality. 4. Cholelithiasis without imaging findings to suggest acute cholecystitis. 5. Rectal tube and Foley catheter in place. 6. Additional ancillary findings as above without significant  interval change. Signed, Criselda Peaches, MD, [facility] [facility] Radiology Electronically Signed   By: Jacqulynn Cadet M.D.   On: 05/29/2019 09:03   Dg Chest Port 1 View  Result Date: 05/28/2019 CLINICAL DATA:  78 year old female with history of shortness of breath. EXAM: PORTABLE CHEST 1 VIEW COMPARISON:  Chest x-ray 05/27/2019. FINDINGS: Lung volumes are slightly low. Bibasilar opacities may reflect moderate bilateral pleural effusions with areas of atelectasis and/or consolidation throughout the lung bases bilaterally. No evidence of pulmonary edema. Mild cardiomegaly. Upper mediastinal contours are within normal limits. Aortic atherosclerosis. IMPRESSION: 1. Unchanged radiographic appearance the chest, as above. Electronically Signed   By: Vinnie Langton M.D.   On: 05/28/2019 21:50   Ir Thoracentesis Asp Pleural Space W/img Guide  Result Date: 05/30/2019 INDICATION: Patient with history of acute on chronic diastolic HF, acute hypoxic respiratory distress, and bilateral pleural effusions (R>L). Request is made for diagnostic and therapeutic right thoracentesis. EXAM: ULTRASOUND GUIDED DIAGNOSTIC AND THERAPEUTIC RIGHT THORACENTESIS MEDICATIONS: 15 mL 1% lidocaine COMPLICATIONS: None immediate. PROCEDURE: An ultrasound guided thoracentesis was thoroughly discussed with  the patient and questions answered. The benefits, risks, alternatives and complications were also discussed. The patient understands and wishes to proceed with the procedure. Written consent was obtained. Ultrasound was performed to localize and mark an adequate pocket of fluid in the right chest. The area was then prepped and draped in the normal sterile fashion. 1% Lidocaine was used for local anesthesia. Under ultrasound guidance a 6 Fr Safe-T-Centesis catheter was introduced. Thoracentesis was performed. The catheter was removed and a dressing applied. FINDINGS: A total of approximately  1.2 L of hazy gold fluid was removed. Samples were sent to the laboratory as requested by the clinical team. IMPRESSION: Successful ultrasound guided right thoracentesis yielding 1.2 L of pleural fluid. Read by: Earley Abide, PA-C Electronically Signed   By: Jerilynn Mages.  Shick M.D.   On: 05/30/2019 09:56     IMPRESSION:   *   Upper GIB with Melena in setting of elevated INR to 8.2.      *   ABL anemia.  No blood transfusions to date.  *    INR 8.2 >> 1.4 after Vit K.  Chronic Coumadin for A fib, on hold.   *   Sepsis, presumed due to acute pancreatitis, presumed biliary in setting of uncomplicated GB stones. WBCs remain elevated at 24 K, though down from 31.    On Meropenem.   Has not been seen by a surgeon yet.  *    bil pleural effusion in setting of volume overload.  S/P thoracentesis left pleural effusion this AM.  Breathing remains compromised.    *   Afib with RVR.     *    Hypokalemia.  3 runs IV K ordered.    *    AKI.  Baseline stage 3 CKD.   PLAN:     *   EGD is set for tomorrow at 10:45 AM.   In the meantime continue the Protonix 40 IV BID.   She can have clear liquids today but n.p.o. after midnight.  *   q 8 CBC.  BMET in AM.    Azucena Freed  05/30/2019, 12:56 PM Phone [phone]

## 2019-05-30 NOTE — Progress Notes (Signed)
Pt more awake this am. Has been sleeping since I came in to my shift. AO x4. Aware that she has test today. Pt said " switch me to easy mask", bi-pap removed, ventri mask applied. Sating 96%. Pt's LUE swollen, midline appeared to be leaking. Midline dressing changed. connection tightened. Elevated in pillow, currently saline locked.  Foley cath was leaking some urine. Peri care given and 2 more cc added to foley balloon. Pt is watching tv peacefully at this time. Will continue to monitor.

## 2019-05-30 NOTE — Progress Notes (Signed)
Initial Nutrition Assessment  RD working remotely.  DOCUMENTATION CODES:   Obesity unspecified  INTERVENTION:   -RD will follow for diet advancement and supplement as appropriate -If prolonged NPO status is anticipated, recommend initiating enteral nutrition support via post-pyloric feeding tube (Cortrak tube placement team available on Mondays and Fridays; otherwise can be placed by IR). Consider:  Initiate Osmolite 1.5 @ 25 ml/hr via post-pyloric feeding tube and increase by 10 ml every 8 hours to goal rate of 45 ml/hr.   30 ml Prostat BID.    Tube feeding regimen provides 1820 kcal (100% of needs), 98 grams of protein, and 823 ml of H2O.   NUTRITION DIAGNOSIS:   Inadequate oral intake related to altered GI function as evidenced by NPO status.  GOAL:   Patient will meet greater than or equal to 90% of their needs  MONITOR:   Diet advancement, Labs, Weight trends, Skin, I & O's  REASON FOR ASSESSMENT:   Low Braden    ASSESSMENT:   Melanie [name] is a 78 y.o. female with medical history significant of DM2, HTN.  Patient presents to the ED with c/o epigastric abd pain, N/V.  Symptoms onset suddenly 3 hours ago, symptoms are severe, nothing makes better or worse, pain located in epigastrium.  Pt admitted with acute pancreatitis. Per MD notes, no necrosis or pseudocyst, but CT also revealed cholelithiasis.   6/1- s/p MRCP- no stone revealed 6/5- s/p BSE- recommend continued NPO status; s/p soap suds enema with BM 6/6- CXR revealed moderate bilateral pleural effusions 6/7- BSE cancelled secondary to increased O2 support 6/8- s/p rt thoracentesis (1.2 L of hazy gold fluid removed)  Reviewed I/O's: +762 ml x 24 hours and +6.3 L since admission  UOP: 800 ml x 24 hours  Per chart review, pt with intermittent confusion during hospitalization, however, this has improved today.   Reviewed wt hx; noted no wt loss over the past year.   Plan for SLP to re-evaluate for  today. Pt has been NPO over the past 7 days secondary to bowel rest for pancreatitis. If prolonged NPO status is anticipated (especially if pt unable to be advanced to a PO diet), consider initiation of nutrition support (preferably enteral feeding via post-pyloric feeding tube).   Medications reviewed and include solumedrol.   Lab Results  Component Value Date   HGBA1C 7.4 (H) 05/29/2019   PTA DM medications are 500 mg metformin BID.   Labs reviewed: Na: 150, K: 3.0 (on IV supplementation), CBGS: 242 (inpatient orders for glycemic control are 0-20 units insulin aspart every 4 hours).   NUTRITION - FOCUSED PHYSICAL EXAM:    Most Recent Value  Orbital Region  Unable to assess  Upper Arm Region  Unable to assess  Thoracic and Lumbar Region  Unable to assess  Buccal Region  Unable to assess  Temple Region  Unable to assess  Clavicle Bone Region  Unable to assess  Clavicle and Acromion Bone Region  Unable to assess  Scapular Bone Region  Unable to assess  Dorsal Hand  Unable to assess  Patellar Region  Unable to assess  Anterior Thigh Region  Unable to assess  Posterior Calf Region  Unable to assess  Edema (RD Assessment)  Unable to assess  Hair  Unable to assess  Eyes  Unable to assess  Mouth  Unable to assess  Skin  Unable to assess  Nails  Unable to assess       Diet Order:   Diet Order  Diet NPO time specified Except for: Sips with Meds  Diet effective now              EDUCATION NEEDS:   Not appropriate for education at this time  Skin:  Skin Assessment: Reviewed RN Assessment  Last BM:  05/30/19 (via rectal tube)  Height:   Ht Readings from Last 1 Encounters:  05/23/19 5\' 4"  (1.626 m)    Weight:   Wt Readings from Last 1 Encounters:  05/23/19 92.9 kg    Ideal Body Weight:  54.5 kg  BMI:  Body mass index is 35.16 kg/m.  Estimated Nutritional Needs:   Kcal:  1700-1900  Protein:  95-110 grams  Fluid:  1.7-1.9 L    Anzel Kearse A.  Jimmye Norman, RD, LDN, [place] Registered Dietitian II Certified Diabetes Care and Education Specialist Pager: [phone] After hours Pager: [phone]

## 2019-05-30 NOTE — Procedures (Signed)
PROCEDURE SUMMARY:  Successful image-guided right thoracentesis. Yielded 1.2 liters of hazy gold fluid. Patient tolerated procedure well. No immediate complications. EBL = 0 mL.  Specimen was sent for labs. CXR ordered.  Claris Pong Louk PA-C 05/30/2019 9:34 AM

## 2019-05-30 NOTE — Telephone Encounter (Signed)
Per note PCP received hospital provider called and discussed pt care with family. Encounter being closed.

## 2019-05-30 NOTE — Progress Notes (Signed)
Inpatient Diabetes Program Recommendations  AACE/ADA: New Consensus Statement on Inpatient Glycemic Control   Target Ranges:  Prepandial:   less than 140 mg/dL      Peak postprandial:   less than 180 mg/dL (1-2 hours)      Critically ill patients:  140 - 180 mg/dL   Results for Melanie [name], Melanie [name] (MRN [id number]) as of 05/30/2019 10:25  Ref. Range 05/29/2019 08:41 05/29/2019 11:58 05/29/2019 15:47 05/29/2019 19:59 05/30/2019 00:01 05/30/2019 04:13  Glucose-Capillary Latest Ref Range: 70 - 99 mg/dL 346 (H)  Novolog 11 units 400 (H)  Novolog 20 units 296 (H)  Novolog 11 units 292 (H)  Novolog 11 units 236 (H)  Novolog 7 units 236 (H)  Novolog 7 units   Review of Glycemic Control  Diabetes history: DM2 Outpatient Diabetes medications: Metformin 500 mg BID Current orders for Inpatient glycemic control: Novolog 0-20 units Q4H; Solumedrol 40 mg BID  Inpatient Diabetes Program Recommendations:   Insulin - Basal: If steroids are continued, please consider ordering Lantus 18 units Q24H starting now (based on 92.9 kg x 0.2 units).  Thanks, Barnie Alderman, RN, MSN, CDE Diabetes Coordinator Inpatient Diabetes Program [phone] (Team Pager from 8am to 5pm)

## 2019-05-30 NOTE — Telephone Encounter (Signed)
Pt's son called in asking if we could give him an update on what is going on with his mom while she is in the hospital. He and his girlfriend Kieth Brightly have reached out to the hospital for an update but haven't had any luck getting a call back.  Please call [phone]

## 2019-05-30 NOTE — Progress Notes (Signed)
CRITICAL VALUE ALERT  Critical Value: potassium   Date & Time Notied:  05/30/2019@ 1320  Provider Notified: Posey Pronto MD   Orders Received/Actions taken: Potasium chloride IV infusing

## 2019-05-30 NOTE — Telephone Encounter (Signed)
I will have to check on how best to respond as this is an odd situation

## 2019-05-30 NOTE — Progress Notes (Signed)
SLP Cancellation Note  Patient Details Name: Melanie [name] MRN: [id number] DOB: [birth year]   Cancelled treatment:       Reason Eval/Treat Not Completed: Medical issues which prohibited therapy. Discussed pt with MD, who said that he prefers for pt to be kept NPO pending GI consult. Will f/u after their consult to determine if clear liquids can be attempted safely.    [name] [name] 05/30/2019, 1:03 PM  [name], M.A. [place] Acute Environmental education officer [phone] Office [phone]

## 2019-05-30 NOTE — Care Management Important Message (Signed)
Important Message  Patient Details  Name: CHRISSIE DACQUISTO MRN: [id number] Date of Birth: 1941/02/09   Medicare Important Message Given:  Yes    [name] 05/30/2019, 12:03 PM

## 2019-05-30 NOTE — Progress Notes (Signed)
Updates given to patient's son via the phone.

## 2019-05-30 NOTE — Progress Notes (Signed)
  Speech Language Pathology Treatment: Dysphagia  Patient Details Name: Melanie [name] MRN: [id number] DOB: [birth year] Today's Date: 05/30/2019 Time: 0940-7680 SLP Time Calculation (min) (ACUTE ONLY): 20 min  Assessment / Plan / Recommendation Clinical Impression  Pt is alert and engaged in PO trials but disoriented to situation and easily distractible. She has difficulty retaining information even from within this session, and does not recall other providers that have come to see her. Mod cues were provided for pt to allow ice chips to melt as opposed to swallowing them whole, but it was the water that elicited more coughing and changes in her respirations. More solid consistencies were not offered given that she is only cleared for clear liquids pending possible EGD on next date. Education was provided about concern for decreased airway protection and recommendation to remain NPO except for ice chips, although reinforcement will be needed. Pt is likely more appropriate for MBS at this point in time, but would hold completion pending completion of EGD and results. Discussed plan with MD.    HPI HPI: 78 y.o. female with medical history significant of DM2, HTN.  Patient presents to the ED with c/o epigastric abd pain, N/V.  Symptoms onset suddenly 3 hours ago, symptoms are severe, nothing makes better or worse, pain located in epigastrium.      SLP Plan  Continue with current plan of care       Recommendations  Diet recommendations: NPO;Other(comment)(ice chips with staff after oral care) Medication Administration: Via alternative means                Oral Care Recommendations: Oral care QID;Oral care prior to ice chip/H20 Follow up Recommendations: (tba) SLP Visit Diagnosis: Dysphagia, unspecified (R13.10) Plan: Continue with current plan of care       GO                Melanie [name] 05/30/2019, 3:16 PM  [name], M.A. [place] Acute Environmental education officer  [phone] Office [phone]

## 2019-05-31 ENCOUNTER — Encounter (HOSPITAL_COMMUNITY): Payer: Self-pay | Admitting: Student

## 2019-05-31 ENCOUNTER — Inpatient Hospital Stay (HOSPITAL_COMMUNITY): Payer: Medicare HMO

## 2019-05-31 ENCOUNTER — Encounter (HOSPITAL_COMMUNITY): Payer: Self-pay | Admitting: Certified Registered"

## 2019-05-31 ENCOUNTER — Encounter (HOSPITAL_COMMUNITY)
Admission: EM | Disposition: A | Discharge status: Hospice | Payer: Self-pay | Source: Home / Self Care | Attending: Internal Medicine

## 2019-05-31 DIAGNOSIS — R195 Other fecal abnormalities: Secondary | ICD-10-CM

## 2019-05-31 DIAGNOSIS — K851 Biliary acute pancreatitis without necrosis or infection: Secondary | ICD-10-CM

## 2019-05-31 DIAGNOSIS — Z7901 Long term (current) use of anticoagulants: Secondary | ICD-10-CM

## 2019-05-31 DIAGNOSIS — J9601 Acute respiratory failure with hypoxia: Secondary | ICD-10-CM

## 2019-05-31 DIAGNOSIS — T17908D Unspecified foreign body in respiratory tract, part unspecified causing other injury, subsequent encounter: Secondary | ICD-10-CM

## 2019-05-31 DIAGNOSIS — R0602 Shortness of breath: Secondary | ICD-10-CM | POA: Diagnosis present

## 2019-05-31 HISTORY — PX: ESOPHAGOGASTRODUODENOSCOPY: SHX1529

## 2019-05-31 HISTORY — PX: IR THORACENTESIS ASP PLEURAL SPACE W/IMG GUIDE: IMG5380

## 2019-05-31 LAB — CBC
HCT: 26.7 % — ABNORMAL LOW (ref 36.0–46.0)
Hemoglobin: 8.8 g/dL — ABNORMAL LOW (ref 12.0–15.0)
MCH: 30.2 pg (ref 26.0–34.0)
MCHC: 33 g/dL (ref 30.0–36.0)
MCV: 91.8 fL (ref 80.0–100.0)
Platelets: 229 10*3/uL (ref 150–400)
RBC: 2.91 MIL/uL — ABNORMAL LOW (ref 3.87–5.11)
RDW: 15.3 % (ref 11.5–15.5)
WBC: 29.4 10*3/uL — ABNORMAL HIGH (ref 4.0–10.5)
nRBC: 2 % — ABNORMAL HIGH (ref 0.0–0.2)

## 2019-05-31 LAB — BLOOD GAS, ARTERIAL
Acid-Base Excess: 0.5 mmol/L (ref 0.0–2.0)
Acid-base deficit: 2.1 mmol/L — ABNORMAL HIGH (ref 0.0–2.0)
Bicarbonate: 24.6 mmol/L (ref 20.0–28.0)
Bicarbonate: 21.3 mmol/L (ref 20.0–28.0)
Delivery systems: POSITIVE
Drawn by: 24686
Drawn by: 535471
Expiratory PAP: 8
FIO2: 90
Inspiratory PAP: 16
O2 Content: 5 L/min
O2 Saturation: 91.6 %
O2 Saturation: 91.6 %
Patient temperature: 98.6
Patient temperature: 97.3
RATE: 18 resp/min
pCO2 arterial: 39.4 mmHg (ref 32.0–48.0)
pCO2 arterial: 29.4 mmHg — ABNORMAL LOW (ref 32.0–48.0)
pH, Arterial: 7.412 (ref 7.350–7.450)
pH, Arterial: 7.469 — ABNORMAL HIGH (ref 7.350–7.450)
pO2, Arterial: 62.5 mmHg — ABNORMAL LOW (ref 83.0–108.0)
pO2, Arterial: 59.3 mmHg — ABNORMAL LOW (ref 83.0–108.0)

## 2019-05-31 LAB — GRAM STAIN

## 2019-05-31 LAB — GLUCOSE, CAPILLARY
Glucose-Capillary: 175 mg/dL — ABNORMAL HIGH (ref 70–99)
Glucose-Capillary: 183 mg/dL — ABNORMAL HIGH (ref 70–99)
Glucose-Capillary: 190 mg/dL — ABNORMAL HIGH (ref 70–99)
Glucose-Capillary: 194 mg/dL — ABNORMAL HIGH (ref 70–99)
Glucose-Capillary: 210 mg/dL — ABNORMAL HIGH (ref 70–99)
Glucose-Capillary: 238 mg/dL — ABNORMAL HIGH (ref 70–99)

## 2019-05-31 LAB — COMPREHENSIVE METABOLIC PANEL
ALT: 22 U/L (ref 0–44)
AST: 22 U/L (ref 15–41)
Albumin: 2.8 g/dL — ABNORMAL LOW (ref 3.5–5.0)
Alkaline Phosphatase: 68 U/L (ref 38–126)
Anion gap: 11 (ref 5–15)
BUN: 92 mg/dL — ABNORMAL HIGH (ref 8–23)
CO2: 21 mmol/L — ABNORMAL LOW (ref 22–32)
Calcium: 8.6 mg/dL — ABNORMAL LOW (ref 8.9–10.3)
Chloride: 123 mmol/L — ABNORMAL HIGH (ref 98–111)
Creatinine, Ser: 2.39 mg/dL — ABNORMAL HIGH (ref 0.44–1.00)
GFR calc Af Amer: 22 mL/min — ABNORMAL LOW (ref >60)
GFR calc non Af Amer: 19 mL/min — ABNORMAL LOW (ref >60)
Glucose, Bld: 227 mg/dL — ABNORMAL HIGH (ref 70–99)
Potassium: 3.7 mmol/L (ref 3.5–5.1)
Sodium: 155 mmol/L — ABNORMAL HIGH (ref 135–145)
Total Bilirubin: 0.6 mg/dL (ref 0.3–1.2)
Total Protein: 5.7 g/dL — ABNORMAL LOW (ref 6.5–8.1)

## 2019-05-31 LAB — PATHOLOGIST SMEAR REVIEW

## 2019-05-31 LAB — BODY FLUID CELL COUNT WITH DIFFERENTIAL
Eos, Fluid: 0 %
Lymphs, Fluid: 13 %
Monocyte-Macrophage-Serous Fluid: 81 % (ref 50–90)
Neutrophil Count, Fluid: 6 % (ref 0–25)
Total Nucleated Cell Count, Fluid: 380 cu mm (ref 0–1000)

## 2019-05-31 LAB — MAGNESIUM: Magnesium: 2.6 mg/dL — ABNORMAL HIGH (ref 1.7–2.4)

## 2019-05-31 LAB — TRIGLYCERIDES, BODY FLUIDS: Triglycerides, Fluid: 64 mg/dL

## 2019-05-31 LAB — LACTATE DEHYDROGENASE: LDH: 320 U/L — ABNORMAL HIGH (ref 98–192)

## 2019-05-31 LAB — HAPTOGLOBIN: Haptoglobin: 373 mg/dL — ABNORMAL HIGH (ref 42–346)

## 2019-05-31 LAB — PROTIME-INR
INR: 1.4 — ABNORMAL HIGH (ref 0.8–1.2)
Prothrombin Time: 17.1 seconds — ABNORMAL HIGH (ref 11.4–15.2)

## 2019-05-31 SURGERY — ESOPHAGOGASTRODUODENOSCOPY (EGD) WITH PROPOFOL
Anesthesia: Monitor Anesthesia Care

## 2019-05-31 MED ORDER — LIDOCAINE HCL 1 % IJ SOLN
INTRAMUSCULAR | Status: AC
  Filled 2019-05-31: qty 20

## 2019-05-31 MED ORDER — SODIUM CHLORIDE 3 % IN NEBU
4.0000 mL | INHALATION_SOLUTION | RESPIRATORY_TRACT | Status: AC | PRN
  Filled 2019-05-31: qty 4

## 2019-05-31 MED ORDER — ALBUMIN HUMAN 25 % IV SOLN
12.5000 g | Freq: Once | INTRAVENOUS | Status: AC
  Administered 2019-05-31: 12:00:00 12.5 g via INTRAVENOUS
  Filled 2019-05-31: qty 50

## 2019-05-31 MED ORDER — METHYLPREDNISOLONE SODIUM SUCC 40 MG IJ SOLR
40.0000 mg | Freq: Every day | INTRAMUSCULAR | Status: DC
  Administered 2019-05-31: 40 mg via INTRAVENOUS
  Filled 2019-05-31: qty 1

## 2019-05-31 MED ORDER — ALBUMIN HUMAN 25 % IV SOLN
12.5000 g | Freq: Once | INTRAVENOUS | Status: DC
  Filled 2019-05-31: qty 50

## 2019-05-31 MED ORDER — IPRATROPIUM-ALBUTEROL 0.5-2.5 (3) MG/3ML IN SOLN
3.0000 mL | Freq: Four times a day (QID) | RESPIRATORY_TRACT | Status: DC
  Administered 2019-05-31 – 2019-06-03 (×13): 3 mL via RESPIRATORY_TRACT
  Filled 2019-05-31 (×12): qty 3

## 2019-05-31 NOTE — Progress Notes (Addendum)
RT went to assess patient and try to take her off bipap along with her breathing treatment. Patient was sating 88% on the bipap with a huge leak. Adjusted mask and patient sats came up. Patient was then tried on high flow nasal cannula and patient was sating in the lower 80's. Patient was on venti mask yesterday so RT tried venti mask with no rise in sats. Patient is now on bipap 12/6 60% with sats being 93%. Patient is alert saying she wants water. Told her we have to get her oxygen up first. RT will continue to monitor. RN at bedside and is aware.

## 2019-05-31 NOTE — Progress Notes (Signed)
Patient transported to IR and back to room 4E01 on bipap without complications.

## 2019-05-31 NOTE — Progress Notes (Signed)
Patient taken off bipap and onto venti mask. Patient is tolerating well and sating 98%. RT will monitor if needed to go back on bipap. RN aware.

## 2019-05-31 NOTE — Progress Notes (Signed)
RN called and said patient was sating in the lower 70's about 30 minutes after taken off bipap. RT arrived and patient was on bipap 12/7 80% and sats were in the lower 80's. RT put bipap on 16/8 100% and sats did rise to high 90's. Patient didn't look in distress when RT arrived. Rapid response nurse, floor RN, and MD at bedside. Chest x-ray was taken. CCM was consulted.

## 2019-05-31 NOTE — Procedures (Signed)
Procedure: [place] chest bilateral Indication: hypoxemia and recent thoracenteses Findings: - bilateral lung sliding - trivial pleural effusions - significant atelectasis noted  Complications: none

## 2019-05-31 NOTE — Progress Notes (Signed)
Patient was having issues of drop in sats.  Made changes to bipap however unable to obtain appropriate sat reading on monitor.  MD paged for RT to obtain an ABG to get true reading.  Rapid response RN also called in order to place patient on their radar.  Will continue to monitor.

## 2019-05-31 NOTE — Significant Event (Signed)
Rapid Response Event Note  Overview: Time Called: 1638 Arrival Time: 1640 Event Type: Respiratory  Initial Focused Assessment: Patient with desat ealier today, pcxr with left lung white out. Thoracentesis done (650cc removed) Post thoracentesis xray improved. Patient tolerating bipap 12/7 50% Trial pt off bipap: placed on 50% venturi, within 30 min O2 desat 72% Per RN patient also with decreased LOC at that time.  Interventions: Placed on Bipap O2 sats did not improve the 1st few minutes. RT at bedside placed pt on 16/8 FiO2 at 100%   O2 sats improved to 100% Weaned Bipap to 14/8  FiO2 50%  O2 sats 94-96% Patient fully alert and interactive  BP 128/63  AF107-120s  RR25  O2 sat 94% on 50% Dr Posey Pronto at bedside to assess patient PCXR done  CCM consulted, Dr Tamala Julian at bedside Trial off Bipap, placed on 50% venturi  O2 desat 77-81% Mouth care Attempted heated HF  O2 sats still 78-82% IS 250, flutter valve,  Productive cough Assisted pt to sitting on the side of bed.  O2 sats improved to 96-100% on heated HF.  Sat upright for about 10 -15 min before she became tired.  Assisted back to lying in bed, placed on right side.  O2 sats maintaining 92%  Plan of Care (if not transferred): IS and flutter valve Bipap PRN Heated HF when off Bipap Mobilize pt  Event Summary: Name of Physician Notified: Posey Pronto at [place]  Name of Consulting Physician Notified: CCM at [place]     Event End Time: [place]  Raliegh Ip

## 2019-05-31 NOTE — Progress Notes (Signed)
CCM consulted. CCM MD said to take patient off bipap and to the venti mask. Patient was fine at first, then sats started to drop. Patient still asking for water. MD gave patient water and ice chips. Heated high flow started at 60L 100%. Patient tolerated well. MD and RN at bedside. RN and rapid response nurse got patient up to the side of the bed and patient tolerated well and sats went to high 90's. RT will continue to monitor.

## 2019-05-31 NOTE — Procedures (Signed)
PROCEDURE SUMMARY:  Successful image-guided left thoracentesis. Yielded 650 milliliters of hazy gold fluid. Patient tolerated procedure well. No immediate complications. EBL = 0 mL.  Specimen was sent for labs. CXR ordered.  Trynity Skousen PA-C 05/31/2019 1:40 PM

## 2019-05-31 NOTE — Progress Notes (Signed)
RT try and assess patient again to take off bipap. Sats are 100% and patient is up talking and on her tablet. Patient taken off bipap and put on venti mask. Patient is tolerating well. RN aware. RT will continue to monitor.

## 2019-05-31 NOTE — Progress Notes (Signed)
Inpatient Diabetes Program Recommendations  AACE/ADA: New Consensus Statement on Inpatient Glycemic Control  Target Ranges:  Prepandial:   less than 140 mg/dL      Peak postprandial:   less than 180 mg/dL (1-2 hours)      Critically ill patients:  140 - 180 mg/dL   Results for [name], [name] (MRN [id number]) as of 05/31/2019 10:49  Ref. Range 05/30/2019 04:13 05/30/2019 11:12 05/30/2019 16:25 05/30/2019 20:52 05/31/2019 00:10 05/31/2019 04:25 05/31/2019 07:54  Glucose-Capillary Latest Ref Range: 70 - 99 mg/dL 236 (H)  Novolog 7 units 242 (H)  Novolog 7 units 234 (H)  Novolog 7 units 209 (H)  Novolog 7 units 210 (H)  Novolog 7 units 183 (H)  Novolog 4 units 194 (H)  Novolog 4 units   Review of Glycemic Control  Diabetes history: DM2 Outpatient Diabetes medications: Metformin 500 mg BID Current orders for Inpatient glycemic control: Novolog 0-20 units Q4H; Solumedrol 40 mg daily  Inpatient Diabetes Program Recommendations:   Insulin - Basal: If steroids are continued, please consider ordering Lantus 9 units Q24H starting now (based on 92.9 kg x 0.1 units).  NOTE: Noted Solumedrol decreased from BID to daily this morning. Patient has received a total of Novolog 22 units for correction over the past 12 hours.   Thanks, Barnie Alderman, RN, MSN, CDE Diabetes Coordinator Inpatient Diabetes Program [phone] (Team Pager from 8am to 5pm)

## 2019-05-31 NOTE — Progress Notes (Signed)
SLP Cancellation Note  Patient Details Name: Melanie [name] MRN: [id number] DOB: 08-Aug-1941   Cancelled treatment:       Reason Eval/Treat Not Completed: Medical issues which prohibited therapy. Pt requiring BiPAP today. Per RN, also likely to have another thoracentesis. Will continue to follow as medically appropriate.   [name] Teandre Hamre 05/31/2019, 11:04 AM  [name], M.A. [place] Acute Environmental education officer [phone] Office [phone]

## 2019-05-31 NOTE — Consult Note (Signed)
NAME:  Melanie [name], MRN:  [id number], DOB:  08-May-1941, LOS: 8 ADMISSION DATE:  05/22/2019, CONSULTATION DATE:  05/31/19 REFERRING MD:  Posey Pronto - [facility], CHIEF COMPLAINT:  Respiratory Failure  Brief History   78 yo F with worsening hypoxia and increasing O2 requirement this afternoon. Admitted with acute pancreatitis, developed bilat pleural effusions (s/p R thora 6/8 and L thora 6/9)  On BiPap  History of present illness   78 yo F PMH HTN DM2 HLD Arthritis who presents to ED 6/1 with abdominal and epigastric pain. Symptoms began 3 hours prior to presentation. Associated symptoms include n/v. Initial labs and CT a/p in ED consistent with acute pancreatitis. Hospital course complicated by bilateral pleural effusions, s/p R sided thoracentesis 6/8 and s/p L sided thoracentesis 6/9. On 6/9 s/p procedure, patient exhibited increasing oxygen requirement, requiring NRB and then BiPAP.   PCCM asked to consult on patient for acute respiratory failure with hypoxia on BiPAP.   Past Medical History  HTN DM2 HLD Arthritis   Significant Hospital Events   6/1 Admitted  6/8 R sided thora 6/9 L sided thora  Consults:  PCCM   Procedures:  6/8 R sided thoracentesis 6/9 L sided thoracentesis   Significant Diagnostic Tests:  CXR 6/9> s/p L sided thoracentesis compared to previous. Some small patchy infiltrate at R base. Small pleural effusion at L Lung base. Atelectasis   Micro Data:  6/1 SARS Cov-2> negative  6/7 SARS Cov 2> negative 6/7 BCx> NGTD  6/8 R sided pleural fluid> no organism seen.  6/9 L sided pleural fluid No organism seen   Antimicrobials:  Meropenem for IAI 6/7>>   Interim history/subjective:   s/p L sided thoracentesis today Increasing oxygen requirement throughout the afternoon   Objective   Blood pressure 137/75, pulse (!) 117, temperature (!) 97.3 F (36.3 C), temperature source Axillary, resp. rate (!) 21, height 5\' 4"  (1.626 m), weight 94.3 kg, SpO2 100 %.     FiO2 (%):  [50 %] 50 %   Intake/Output Summary (Last 24 hours) at 05/31/2019 1745 Last data filed at 05/31/2019 0715 Gross per 24 hour  Intake 883.6 ml  Output 950 ml  Net -66.4 ml   Filed Weights   05/22/19 2216 05/23/19 0355 05/31/19 0438  Weight: 99.8 kg 92.9 kg 94.3 kg    Examination: General: WDWN older adult female, reclined in bed on BiPAP, NAD  HENT: NCAT. BiPAP in place with good seal. Anicteric sclera. Trachea midline. Pink mucus membranes Lungs:  No accessory muscle recruitment. Symmetrical chest wall expansion. Course breath sounds.  Cardiovascular: IRIR. s1s2 no rgm.  Abdomen: Soft, round. + bowel sounds Extremities: Symmetrical bulk and tone. No cyanosis. No clubbing.  Cool to touch  Neuro: AAO following commands. PERRL Skin: pale, clean, dry, without rash  GU: due to void   Resolved Hospital Problem list    Assessment & Plan:   Acute Respiratory Failure with Hypoxia requiring BiPAP -bilateral pleural effusions s/p thoracentesis (etiology possibly third spacing related to  Pancreatitis) -Bedside US evaluation does not show significant recurrence of effusions  P - Lower extremity venous dopplers - Humidified High Flow or PRN BiPAP for SpO2 goal > 80%  - CPT q4 hours - Duonebs - Hypertonic saline nebs - Flutter valve when off BiPAP  - ECHO bubble study to evaluate for possible shunt  - OOB to chair - AM CXR  - We will follow up tomorrow (6/10)      Rest Per Primary  Best practice:  Diet: NPO  Pain/Anxiety/Delirium protocol (if indicated): APAP, PRN dilaudid  VAP protocol (if indicated): n/a DVT prophylaxis: SCDs GI prophylaxis: protonix  Glucose control: SSI  Mobility: ad lib  Code Status: Full  Family Communication: Per Primary Disposition: Maintain stepdown   Labs   CBC: Recent Labs  Lab 05/25/19 0435  05/27/19 0911  05/29/19 1035  05/29/19 2315 05/30/19 0500 05/30/19 1130 05/30/19 1700 05/31/19 0319  WBC 15.0*   < > 17.5*   < >  31.0*   < > 26.9* 24.7* 27.8* 36.7* 29.4*  NEUTROABS 13.5*  --  15.4*  --  27.2*  --   --   --   --   --   --   HGB 14.2   < > 15.1*   < > 11.1*   < > 9.2* 8.6* 8.9* 9.4* 8.8*  HCT 42.8   < > 44.6   < > 33.9*   < > 27.4* 26.0* 26.4* 28.1* 26.7*  MCV 90.3   < > 89.2   < > 91.4   < > 90.4 91.5 90.1 90.9 91.8  PLT 194   < > 236   < > 303   < > 242 233 238 268 229   < > = values in this interval not displayed.    Basic Metabolic Panel: Recent Labs  Lab 05/25/19 0435  05/27/19 0911  05/29/19 2315 05/30/19 0500 05/30/19 1130 05/30/19 1700 05/31/19 1028  NA 141   < > 148*   < > 148* 150* 151* 152* 155*  K 3.6   < > 2.9*   < > 2.9* 3.0* 2.6* 3.1* 3.7  CL 109   < > 112*   < > 116* 118* 116* 119* 123*  CO2 21*   < > 21*   < > 20* 20* 18* 20* 21*  GLUCOSE 181*   < > 200*   < > 321* 313* 321* 269* 227*  BUN 32*   < > 27*   < > 127* 124* 117* 108* 92*  CREATININE 1.35*   < > 1.17*   < > 3.63* 3.56* 3.26* 2.96* 2.39*  CALCIUM 8.0*   < > 8.0*   < > 7.8* 7.9* 8.1* 8.0* 8.6*  MG 1.7  --  1.8  --   --   --   --   --  2.6*   < > = values in this interval not displayed.   GFR: Estimated Creatinine Clearance: 21.9 mL/min (A) (by C-G formula based on SCr of 2.39 mg/dL (H)). Recent Labs  Lab 05/25/19 1428  05/29/19 1035  05/30/19 0500 05/30/19 1130 05/30/19 1700 05/31/19 0319  WBC  --    < > 31.0*   < > 24.7* 27.8* 36.7* 29.4*  LATICACIDVEN 2.0*  --  1.8  --   --   --   --   --    < > = values in this interval not displayed.    Liver Function Tests: Recent Labs  Lab 05/25/19 0435 05/26/19 0814 05/27/19 0911 05/29/19 1035 05/31/19 1028  AST 25 22 17 29 22   ALT 29 24 23  35 22  ALKPHOS 43 63 72 79 68  BILITOT 1.0 1.1 1.3* 0.9 0.6  PROT 5.6* 5.6* 6.0* 5.4* 5.7*  ALBUMIN 2.4* 2.4* 2.5* 2.1* 2.8*   Recent Labs  Lab 05/25/19 1428 05/29/19 1035  LIPASE 91* 32   Recent Labs  Lab 05/25/19 1428  AMMONIA 27    ABG  Component Value Date/Time   PHART 7.469 (H) 05/31/2019 1255    PCO2ART 29.4 (L) 05/31/2019 1255   PO2ART 59.3 (L) 05/31/2019 1255   HCO3 21.3 05/31/2019 1255   TCO2 25 05/22/2019 2338   ACIDBASEDEF 2.1 (H) 05/31/2019 1255   O2SAT 91.6 05/31/2019 1255     Coagulation Profile: Recent Labs  Lab 05/28/19 0249 05/29/19 0533 05/29/19 1617 05/30/19 0500 05/31/19 0319  INR 5.1* 8.2* 2.0* 1.4* 1.4*    Cardiac Enzymes: No results for input(s): CKTOTAL, CKMB, CKMBINDEX, TROPONINI in the last 168 hours.  HbA1C: Hgb A1c MFr Bld  Date/Time Value Ref Range Status  05/29/2019 10:35 AM 7.4 (H) 4.8 - 5.6 % Final    Comment:    (NOTE) Pre diabetes:          5.7%-6.4% Diabetes:              >6.4% Glycemic control for   <7.0% adults with diabetes   02/24/2019 11:13 AM 7.5 (H) 4.6 - 6.5 % Final    Comment:    Glycemic Control Guidelines for People with Diabetes:Non Diabetic:  <6%Goal of Therapy: <7%Additional Action Suggested:  >8%     CBG: Recent Labs  Lab 05/31/19 0010 05/31/19 0425 05/31/19 0754 05/31/19 1214 05/31/19 1603  GLUCAP 210* 183* 194* 238* 190*    Review of Systems:   Endorses SOB  Past Medical History  She,  has a past medical history of Allergic rhinitis, Anticoagulated on Coumadin, Arthritis, Diabetes mellitus, GERD (gastroesophageal reflux disease), Hyperlipidemia, Hypertension, and Pain in joint, ankle and foot (09/14/2013).   Surgical History    Past Surgical History:  Procedure Laterality Date  . COLONOSCOPY     03/2018, removed polyps  . FRACTURE SURGERY Left     leg  . hiatal hernia surgery  2010  . IR THORACENTESIS ASP PLEURAL SPACE W/IMG GUIDE  05/30/2019  . IR THORACENTESIS ASP PLEURAL SPACE W/IMG GUIDE  05/31/2019  . ROTATOR CUFF REPAIR Right 2010  . TONSILLECTOMY       Social History   reports that she has never smoked. She has never used smokeless tobacco. She reports that she does not drink alcohol or use drugs.   Family History   Her family history includes Breast cancer (age of onset: 19) in her  maternal aunt; Breast cancer (age of onset: 14) in her paternal grandmother; Colon cancer (age of onset: 65) in her maternal aunt; Colon cancer (age of onset: 57) in her brother; Diabetes in her maternal aunt; Heart disease in her mother. There is no history of Stomach cancer.   Allergies Allergies  Allergen Reactions  . Niacin And Related Rash  . Keflex [Cephalexin] Other (See Comments)    Fatigue, sore throat, body aches  . Sausage [Pickled Meat] Other (See Comments)    Drowsiness and dizziness  . Tape Rash    Bandaids      Home Medications  Prior to Admission medications   Medication Sig Start Date End Date Taking? Authorizing Provider  Ascorbic Acid (VITAMIN C) 1000 MG tablet Take 1,000 mg by mouth 2 (two) times daily.    [provider]  Calcium Carb-Cholecalciferol (CALTRATE 600+D3 SOFT PO) Take 1 tablet by mouth 2 (two) times daily.    [provider]  CRANBERRY PO Take 2 tablets by mouth daily.    [provider]  Cyanocobalamin (VITAMIN B-12) 1000 MCG SUBL Place 1 each under the tongue daily.    [provider]  diltiazem (CARDIZEM) 90 MG  tablet TAKE 1 TABLET (90 MG TOTAL) BY MOUTH 2 (TWO) TIMES DAILY. 04/07/19   [name], MD  fish oil-omega-3 fatty acids 1000 MG capsule Take 2 g by mouth daily.    [provider]  fluticasone (FLONASE) 50 MCG/ACT nasal spray Place 2 sprays into both nostrils daily as needed for allergies or rhinitis.    [provider]  furosemide (LASIX) 20 MG tablet TAKE 1 TABLET BY MOUTH EVERY DAY Patient taking differently: Take 20 mg by mouth daily.  02/01/19   [name], MD  hydrocortisone 2.5 % cream APPLY TO THE FACE TWICE A DAY AS NEEDED 1 WEEK ON THEN 1 WEEK OFF 04/12/18   [provider]  lisinopril-hydrochlorothiazide (PRINZIDE,ZESTORETIC) 20-12.5 MG tablet TAKE 1 TABLET BY MOUTH EVERY DAY Patient taking differently: Take 1 tablet by mouth daily.  02/01/19   [name], MD  loperamide (IMODIUM A-D) 2 MG capsule Take by mouth as needed for diarrhea or loose stools.    [provider]  metFORMIN (GLUCOPHAGE) 500 MG tablet TAKE 1 TABLET (500 MG TOTAL) BY MOUTH 2 (TWO) TIMES DAILY WITH A MEAL. 12/27/18   [name], MD  metoprolol tartrate (LOPRESSOR) 25 MG tablet Take 1.5 tablets (37.5 mg total) by mouth 2 (two) times daily. Patient taking differently: Take 25 mg by mouth 2 (two) times daily.  02/22/19   Sherran Needs, NP  omeprazole (PRILOSEC) 20 MG capsule TAKE 1 CAPSULE BY MOUTH EVERY DAY Patient taking differently: Take 20 mg by mouth daily.  03/21/19   [name], MD  pravastatin (PRAVACHOL) 40 MG tablet Take 1 tablet (40 mg total) by mouth daily. 02/24/19   [name], MD  Probiotic Product (PROBIOTIC PO) Take 1 capsule by mouth daily.     [provider]  sulfamethoxazole-trimethoprim (BACTRIM DS) 800-160 MG tablet Take 1 tablet by mouth 2 (two) times daily. 04/02/19   Hassell Done Mary-Margaret, FNP  vitamin E 100 UNIT capsule Take by mouth.    [provider]  warfarin (COUMADIN) 5 MG tablet TAKE AS DIRECTED BY COUMADIN CLINIC Patient taking differently: Take 5-7.5 mg by mouth See admin instructions. Take 1 1/2 tablet (7.5 mg) by mouth on Monday, Wednesday, Friday, take 1 tablet (5 mg) on Sunday, Tuesday, Thursday, Saturday - or as directed by Coumadin Clinic 03/25/19   Sherren Mocha, MD      Eliseo Gum MSN, AGACNP-BC [place] [phone] If no answer, [phone] 05/31/2019, 5:46 PM

## 2019-05-31 NOTE — Progress Notes (Signed)
Daily Rounding Note  05/31/2019, 9:07 AM  LOS: 8 days   SUBJECTIVE:   Chief complaint:  Anemia.   FOBT +, dark stool.  Acute (biliary?) pancreatitis.   At 740 this AM, resp therapist documented sats of low 80s on high flow [place] oxygen.  No improvement with Venturi mask so placed back on Bipap at which point sats up to 93%.   Stools to flexiseal continue dark brown, watery but not bloody or burgundy.   No abd pain.  No nausea. Thirsty with dry mouth.   Dr Posey Pronto repeated order for IV albumin this AM.    OBJECTIVE:         Vital signs in last 24 hours:    Temp:  [97.2 F (36.2 C)-97.7 F (36.5 C)] 97.3 F (36.3 C) (06/09 0400) Pulse Rate:  [29-137] 78 (06/09 0810) Resp:  [13-35] 20 (06/09 0810) BP: (104-151)/(40-123) 104/40 (06/09 0810) SpO2:  [90 %-100 %] 91 % (06/09 0810) FiO2 (%):  [50 %] 50 % (06/09 0731) Weight:  [94.3 kg] 94.3 kg (06/09 0438) Last BM Date: 05/30/19 Filed Weights   05/22/19 2216 05/23/19 0355 05/31/19 0438  Weight: 99.8 kg 92.9 kg 94.3 kg   General: looks ill.  Alert, slightly uncomfortable but not in pain.  bipap mask in place   Heart: irreg in low 100s Chest: clear in front.  Slightly labored breathing with bipap in place but able to speak. Abdomen: soft, NT, ND.  No bruises.  Watery, dark brown stool (color of unsweetened chocolate) in flexi-seal.    Extremities: slight pedal edema Neuro/Psych:  Oriented x 3.  Appropriate, follows commands.    Intake/Output from previous day: 06/08 0701 - 06/09 0700 In: 1866.1 [I.V.:1181.3; IV Piggyback:684.8] Out: 2750 [Urine:1450; Stool:100]  Intake/Output this shift: Total I/O In: 250 [Other:250] Out: -   Lab Results: Recent Labs    05/30/19 1130 05/30/19 1700 05/31/19 0319  WBC 27.8* 36.7* 29.4*  HGB 8.9* 9.4* 8.8*  HCT 26.4* 28.1* 26.7*  PLT 238 268 229   BMET Recent Labs    05/30/19 0500 05/30/19 1130 05/30/19 1700  NA 150* 151* 152*   K 3.0* 2.6* 3.1*  CL 118* 116* 119*  CO2 20* 18* 20*  GLUCOSE 313* 321* 269*  BUN 124* 117* 108*  CREATININE 3.56* 3.26* 2.96*  CALCIUM 7.9* 8.1* 8.0*   LFT Recent Labs    05/29/19 1035  PROT 5.4*  ALBUMIN 2.1*  AST 29  ALT 35  ALKPHOS 79  BILITOT 0.9   PT/INR Recent Labs    05/30/19 0500 05/31/19 0319  LABPROT 17.3* 17.1*  INR 1.4* 1.4*   Hepatitis Panel No results for input(s): HEPBSAG, HCVAB, HEPAIGM, HEPBIGM in the last 72 hours.  Studies/Results: Dg Chest 1 View  Result Date: 05/30/2019 CLINICAL DATA:  Status post right thoracentesis EXAM: CHEST  1 VIEW COMPARISON:  05/28/2019 FINDINGS: Cardiac shadow is stable. Aortic calcifications are again seen. Mild right basilar atelectasis is noted although resolution of right-sided pleural effusion is seen. No pneumothorax is noted. Persistent left-sided effusion is seen. IMPRESSION: No pneumothorax following right thoracentesis. Stable left pleural effusion is noted. Electronically Signed   By: Inez Catalina M.D.   On: 05/30/2019 09:43   Dg Chest Port 1 View  Result Date: 05/31/2019 CLINICAL DATA:  Shortness of breath EXAM: PORTABLE CHEST 1 VIEW COMPARISON:  05/30/2019 FINDINGS: Worsening aeration on the left with near complete opacification of the left hemithorax, likely a  combination of effusion and atelectasis. Patchy airspace disease noted in the right perihilar region. No visible effusion on the right. No acute bony abnormality. IMPRESSION: Worsening aeration on the left, likely a combination of effusion and airspace disease/atelectasis with near complete opacification of the left hemithorax. Patchy right perihilar opacities. Electronically Signed   By: Rolm Baptise M.D.   On: 05/31/2019 08:48   Ir Thoracentesis Asp Pleural Space W/img Guide  Result Date: 05/30/2019 INDICATION: Patient with history of acute on chronic diastolic HF, acute hypoxic respiratory distress, and bilateral pleural effusions (R>L). Request is made for  diagnostic and therapeutic right thoracentesis. EXAM: ULTRASOUND GUIDED DIAGNOSTIC AND THERAPEUTIC RIGHT THORACENTESIS MEDICATIONS: 15 mL 1% lidocaine COMPLICATIONS: None immediate. PROCEDURE: An ultrasound guided thoracentesis was thoroughly discussed with the patient and questions answered. The benefits, risks, alternatives and complications were also discussed. The patient understands and wishes to proceed with the procedure. Written consent was obtained. Ultrasound was performed to localize and mark an adequate pocket of fluid in the right chest. The area was then prepped and draped in the normal sterile fashion. 1% Lidocaine was used for local anesthesia. Under ultrasound guidance a 6 Fr Safe-T-Centesis catheter was introduced. Thoracentesis was performed. The catheter was removed and a dressing applied. FINDINGS: A total of approximately 1.2 L of hazy gold fluid was removed. Samples were sent to the laboratory as requested by the clinical team. IMPRESSION: Successful ultrasound guided right thoracentesis yielding 1.2 L of pleural fluid. Read by: Earley Abide, PA-C Electronically Signed   By: Jerilynn Mages.  Shick M.D.   On: 05/30/2019 09:56    Scheduled Meds: . insulin aspart  0-20 Units Subcutaneous Q4H  . levalbuterol  0.63 mg Nebulization Q6H  . methylPREDNISolone (SOLU-MEDROL) injection  40 mg Intravenous Daily  . pantoprazole (PROTONIX) IV  40 mg Intravenous Q12H  . sodium chloride flush  10-40 mL Intracatheter Q12H   Continuous Infusions: . albumin human    . amiodarone 30 mg/hr (05/31/19 0553)  . lactated ringers 50 mL/hr at 05/31/19 0554  . meropenem (MERREM) IV 500 mg (05/30/19 2126)   PRN Meds:.acetaminophen **OR** acetaminophen, HYDROmorphone (DILAUDID) injection, levalbuterol, lidocaine (PF), LORazepam, metoprolol tartrate, ondansetron **OR** ondansetron (ZOFRAN) IV, sodium chloride flush   ASSESMENT:   *   Upper GIB with Melena in setting of elevated INR to 8.2.   IV BID Protonix in  place.  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 H pylori labs pndg.      *   ABL anemia.  No blood transfusions to date.  hgb stable.    *   Resp failure.  Bil pleural effusion in setting of volume overload. S/P 6/9 L thoracentesis.  Resp status remains compromised.    *    INR 8.2 >> 1.4 after Vit K.  Chronic Coumadin for A fib, on hold.   *   Sepsis, presumed due to acute pancreatitis, presumed biliary in setting of uncomplicated GB stones. WBCs remain sign elevated and labile 24.7 >> 27.8 >> 36.7 >> 29.4 in last 24 hours.  No fevers.  On Meropenem.   Has not been seen by a surgeon yet.  *   Afib with RVR.  Heart rate 70s to 90s in last few hours.   *    Hypokalemia.  Improved but not resolved with yest runs of K.    Hypernatremia.    *   Dysphagia.  NPO per SLP rec.      *    AKI.  Baseline stage 3  CKD.   *   DM 2.  Sugars elevated in setting of Solumedrol.    *    Hx chronic diarrhea of unclear cause found on 03/2018 Colonoscopy.  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     PLAN   *  cxld the EGD for today, pt to unstable and GI bleed non-urgent.    *   CBC in AM.    *   If stable from anemia standpoint, may be able to avoid endoscopy.  Could switch to po PPI tomorrow.    *   Given her dysphagia, may need to place coretrack to allow for enteral nutrition.      Azucena Freed  05/31/2019, 9:07 AM Phone [phone]

## 2019-06-01 ENCOUNTER — Inpatient Hospital Stay (HOSPITAL_COMMUNITY): Payer: Medicare HMO

## 2019-06-01 ENCOUNTER — Other Ambulatory Visit (HOSPITAL_COMMUNITY): Payer: Medicare HMO

## 2019-06-01 ENCOUNTER — Other Ambulatory Visit: Payer: Self-pay

## 2019-06-01 ENCOUNTER — Encounter (HOSPITAL_COMMUNITY): Payer: Self-pay | Admitting: General Practice

## 2019-06-01 DIAGNOSIS — I82409 Acute embolism and thrombosis of unspecified deep veins of unspecified lower extremity: Secondary | ICD-10-CM

## 2019-06-01 DIAGNOSIS — K8010 Calculus of gallbladder with chronic cholecystitis without obstruction: Secondary | ICD-10-CM

## 2019-06-01 DIAGNOSIS — R109 Unspecified abdominal pain: Secondary | ICD-10-CM

## 2019-06-01 DIAGNOSIS — R0602 Shortness of breath: Secondary | ICD-10-CM

## 2019-06-01 DIAGNOSIS — K859 Acute pancreatitis without necrosis or infection, unspecified: Secondary | ICD-10-CM

## 2019-06-01 DIAGNOSIS — K8020 Calculus of gallbladder without cholecystitis without obstruction: Secondary | ICD-10-CM | POA: Diagnosis present

## 2019-06-01 DIAGNOSIS — J9601 Acute respiratory failure with hypoxia: Secondary | ICD-10-CM

## 2019-06-01 DIAGNOSIS — I4891 Unspecified atrial fibrillation: Secondary | ICD-10-CM

## 2019-06-01 LAB — COMPREHENSIVE METABOLIC PANEL
ALT: 22 U/L (ref 0–44)
AST: 19 U/L (ref 15–41)
Albumin: 2.7 g/dL — ABNORMAL LOW (ref 3.5–5.0)
Alkaline Phosphatase: 59 U/L (ref 38–126)
Anion gap: 11 (ref 5–15)
BUN: 73 mg/dL — ABNORMAL HIGH (ref 8–23)
CO2: 22 mmol/L (ref 22–32)
Calcium: 8.5 mg/dL — ABNORMAL LOW (ref 8.9–10.3)
Chloride: 123 mmol/L — ABNORMAL HIGH (ref 98–111)
Creatinine, Ser: 1.97 mg/dL — ABNORMAL HIGH (ref 0.44–1.00)
GFR calc Af Amer: 28 mL/min — ABNORMAL LOW (ref >60)
GFR calc non Af Amer: 24 mL/min — ABNORMAL LOW (ref >60)
Glucose, Bld: 192 mg/dL — ABNORMAL HIGH (ref 70–99)
Potassium: 3.6 mmol/L (ref 3.5–5.1)
Sodium: 156 mmol/L — ABNORMAL HIGH (ref 135–145)
Total Bilirubin: 0.7 mg/dL (ref 0.3–1.2)
Total Protein: 5.4 g/dL — ABNORMAL LOW (ref 6.5–8.1)

## 2019-06-01 LAB — LACTIC ACID, PLASMA
Lactic Acid, Venous: 2 mmol/L (ref 0.5–1.9)
Lactic Acid, Venous: 2.3 mmol/L (ref 0.5–1.9)

## 2019-06-01 LAB — CBC WITH DIFFERENTIAL/PLATELET
Abs Immature Granulocytes: 0.79 10*3/uL — ABNORMAL HIGH (ref 0.00–0.07)
Basophils Absolute: 0.1 10*3/uL (ref 0.0–0.1)
Basophils Relative: 0 %
Eosinophils Absolute: 0 10*3/uL (ref 0.0–0.5)
Eosinophils Relative: 0 %
HCT: 29.8 % — ABNORMAL LOW (ref 36.0–46.0)
Hemoglobin: 9.6 g/dL — ABNORMAL LOW (ref 12.0–15.0)
Immature Granulocytes: 3 %
Lymphocytes Relative: 3 %
Lymphs Abs: 0.7 10*3/uL (ref 0.7–4.0)
MCH: 29.9 pg (ref 26.0–34.0)
MCHC: 32.2 g/dL (ref 30.0–36.0)
MCV: 92.8 fL (ref 80.0–100.0)
Monocytes Absolute: 0.5 10*3/uL (ref 0.1–1.0)
Monocytes Relative: 2 %
Neutro Abs: 24.9 10*3/uL — ABNORMAL HIGH (ref 1.7–7.7)
Neutrophils Relative %: 92 %
Platelets: 251 10*3/uL (ref 150–400)
RBC: 3.21 MIL/uL — ABNORMAL LOW (ref 3.87–5.11)
RDW: 15.7 % — ABNORMAL HIGH (ref 11.5–15.5)
WBC: 27 10*3/uL — ABNORMAL HIGH (ref 4.0–10.5)
nRBC: 2.2 % — ABNORMAL HIGH (ref 0.0–0.2)

## 2019-06-01 LAB — BASIC METABOLIC PANEL
Anion gap: 9 (ref 5–15)
Anion gap: 10 (ref 5–15)
BUN: 64 mg/dL — ABNORMAL HIGH (ref 8–23)
BUN: 67 mg/dL — ABNORMAL HIGH (ref 8–23)
CO2: 20 mmol/L — ABNORMAL LOW (ref 22–32)
CO2: 21 mmol/L — ABNORMAL LOW (ref 22–32)
Calcium: 8.1 mg/dL — ABNORMAL LOW (ref 8.9–10.3)
Calcium: 8.3 mg/dL — ABNORMAL LOW (ref 8.9–10.3)
Chloride: 126 mmol/L — ABNORMAL HIGH (ref 98–111)
Chloride: 123 mmol/L — ABNORMAL HIGH (ref 98–111)
Creatinine, Ser: 1.9 mg/dL — ABNORMAL HIGH (ref 0.44–1.00)
Creatinine, Ser: 1.78 mg/dL — ABNORMAL HIGH (ref 0.44–1.00)
GFR calc Af Amer: 29 mL/min — ABNORMAL LOW (ref >60)
GFR calc Af Amer: 31 mL/min — ABNORMAL LOW (ref >60)
GFR calc non Af Amer: 25 mL/min — ABNORMAL LOW (ref >60)
GFR calc non Af Amer: 27 mL/min — ABNORMAL LOW (ref >60)
Glucose, Bld: 240 mg/dL — ABNORMAL HIGH (ref 70–99)
Glucose, Bld: 276 mg/dL — ABNORMAL HIGH (ref 70–99)
Potassium: 3.7 mmol/L (ref 3.5–5.1)
Potassium: 3.9 mmol/L (ref 3.5–5.1)
Sodium: 155 mmol/L — ABNORMAL HIGH (ref 135–145)
Sodium: 154 mmol/L — ABNORMAL HIGH (ref 135–145)

## 2019-06-01 LAB — CHOLESTEROL, BODY FLUID: Cholesterol, Fluid: 68 mg/dL

## 2019-06-01 LAB — H PYLORI, IGM, IGG, IGA AB
H Pylori IgG: 0.25 Index Value (ref 0.00–0.79)
H. Pylogi, Iga Abs: 10 units — ABNORMAL HIGH (ref 0.0–8.9)
H. Pylogi, Igm Abs: <9 units (ref 0.0–8.9)

## 2019-06-01 LAB — GLUCOSE, CAPILLARY
Glucose-Capillary: 110 mg/dL — ABNORMAL HIGH (ref 70–99)
Glucose-Capillary: 157 mg/dL — ABNORMAL HIGH (ref 70–99)
Glucose-Capillary: 159 mg/dL — ABNORMAL HIGH (ref 70–99)
Glucose-Capillary: 166 mg/dL — ABNORMAL HIGH (ref 70–99)
Glucose-Capillary: 217 mg/dL — ABNORMAL HIGH (ref 70–99)
Glucose-Capillary: 230 mg/dL — ABNORMAL HIGH (ref 70–99)

## 2019-06-01 LAB — MAGNESIUM: Magnesium: 2.5 mg/dL — ABNORMAL HIGH (ref 1.7–2.4)

## 2019-06-01 LAB — PROTIME-INR
INR: 1.4 — ABNORMAL HIGH (ref 0.8–1.2)
Prothrombin Time: 16.6 seconds — ABNORMAL HIGH (ref 11.4–15.2)

## 2019-06-01 MED ORDER — LIDOCAINE VISCOUS HCL 2 % MT SOLN
OROMUCOSAL | Status: AC
  Filled 2019-06-01: qty 15

## 2019-06-01 MED ORDER — DEXTROSE 5 % IV SOLN
INTRAVENOUS | Status: DC
  Administered 2019-06-01: 12:00:00 via INTRAVENOUS

## 2019-06-01 MED ORDER — SODIUM CHLORIDE 0.9 % IV BOLUS
250.0000 mL | Freq: Once | INTRAVENOUS | Status: AC
  Administered 2019-06-01: 250 mL via INTRAVENOUS

## 2019-06-01 MED ORDER — DILTIAZEM LOAD VIA INFUSION
15.0000 mg | Freq: Once | INTRAVENOUS | Status: AC
  Administered 2019-06-01: 15 mg via INTRAVENOUS
  Filled 2019-06-01: qty 15

## 2019-06-01 MED ORDER — ENSURE ENLIVE PO LIQD
237.0000 mL | Freq: Two times a day (BID) | ORAL | Status: DC
  Administered 2019-06-01 – 2019-06-03 (×2): 237 mL via ORAL

## 2019-06-01 MED ORDER — DILTIAZEM HCL-DEXTROSE 100-5 MG/100ML-% IV SOLN (PREMIX)
5.0000 mg/h | INTRAVENOUS | Status: DC
  Administered 2019-06-01: 20:00:00 12.5 mg/h via INTRAVENOUS
  Administered 2019-06-01: 7.5 mg/h via INTRAVENOUS
  Administered 2019-06-01: 12.5 mg/h via INTRAVENOUS
  Administered 2019-06-01: 12:00:00 5 mg/h via INTRAVENOUS
  Administered 2019-06-01: 16:00:00 10 mg/h via INTRAVENOUS
  Administered 2019-06-02 (×2): 12.5 mg/h via INTRAVENOUS
  Administered 2019-06-02 – 2019-06-04 (×5): 10 mg/h via INTRAVENOUS
  Administered 2019-06-05: 6 mg/h via INTRAVENOUS
  Filled 2019-06-01 (×9): qty 100

## 2019-06-01 NOTE — Progress Notes (Signed)
Patient sat up on the side of the bed and tolerated well. Got a back rub while sitting there. Patient enjoyed.   Emelda Fear, RN

## 2019-06-01 NOTE — Progress Notes (Signed)
CRITICAL VALUE ALERT  Critical Value:  Lactic acid 2.3  Date & Time Notied:  6/09 1213  Provider Notified: Dr. Posey Pronto paged via [place]  Orders Received/Actions taken:

## 2019-06-01 NOTE — Progress Notes (Signed)
Updated patient's son on patient status.

## 2019-06-01 NOTE — Progress Notes (Signed)
Bilateral lower extremity venous duplex completed. Refer to "CV Proc" under chart review to view preliminary results.  06/01/2019 4:44 PM Maudry Mayhew, MHA, RVT, RDCS, RDMS

## 2019-06-01 NOTE — Progress Notes (Addendum)
[facility] Progress Note  Patient: Melanie [name] UVO:[id number]   PCP: [name], MD DOB: 1941-07-16   DOA: 05/22/2019   DOS: 05/31/2019   Date of Service: the patient was seen and examined on 05/31/2019  Brief hospital course: Pt. with PMH of type II DM, HTN, mild dementia per family; admitted on 05/22/2019, presented with complaint of epigastric abdominal pain nausea and vomiting, was found to have acute pancreatitis as well as sepsis of unknown origin. Currently further plan is continue current treatment.  Subjective: In the afternoon becomes more short of breath.  No nausea no vomiting.  Assessment and Plan: 1. Sepsis POA, unknown etiology likely intra-abdominal source Anion gap metabolic acidosis Possible community-acquired versus aspiration pneumonia Patient received IV fluids appropriately.  Then became volume overloaded. Initially treated with IV vancomycin and Zosyn.  Transition to IV Unasyn. Now leukocytosis is worsening.  As well as respiration. Chest x-ray on 05/28/2019 shows moderate bilateral pleural effusion with consolidation.  CT abdomen shows no evidence of acute abnormality.  CT chest shows bilateral pleural effusion. ABG shows chronic compensated respiratory alkalosis along with metabolic acidosis and high anion gap Continue to maintain n.p.o. for now. Unfortunately no blood cultures were performed on the time of admission.  Since the patient is not responding to IV Unasyn and IV Zosyn.  Will perform blood cultures as well as We will broaden the antibiotic coverage to IV meropenem.  2.  Acute gallstone pancreatitis. Initial work-up at the time of admission with ultrasound abdomen was negative for any CBD dilatation or CBD stone. MRCP was performed which was also negative for any CBD dilatation or CBD stone. Eventually patient will require cholecystectomy but currently not stable to consider that for now in the presence of ongoing sepsis.  3.  Acute kidney  injury. Likely multifactorial.  Initially patient was hypovolemic in the setting of pancreatitis.  Later on patient was hyperkalemic in the setting of resuscitation efforts. Blood pressure changes with accelerated hypertension at the time of admission as well as hypotension intermittently could also contribute to AKI. Overdiuresis could also be the etiology. Now the BUN is also elevated, concern is for GI bleed. We will start the patient on gentle IV LR infusion.  4.  Acute hypoxic respiratory distress. Acute on chronic diastolic CHF in the setting of hypervolemia from IV resuscitation Bilateral pleural effusion Community-acquired versus aspiration pneumonia  Multifactorial. Bilateral pleural effusions still present on chest x-ray last night. We will perform CT scan of the chest for further evaluation. Echocardiogram showed preserved EF without any significant wall motion abnormality or valvular abnormality. We will perform ABG only if there is any evidence of mental status changes.  Not for hypoxia. BiPAP PRN but for now holding secondary to hypotension.  And the patient is able to tolerate high flow nasal cannula for now. Already on IV antibiotics.  Added IV steroids on 05/28/2019. Continue Xopenex scheduled nebulization as well as Xopenex as needed nebulization. Received multiple rounds of IV as needed Lasix. CCM consult for further assistance. Appreciate input. Patient started on high flow nasal cannula.  Chest PT.  5.  Possible GI bleed. Acute blood loss anemia.  Likely dilutional plus hemorrhagic Supratherapeutic INR. Constipation. Presents with hemoglobin of 16.  Aggressively resuscitated.  Hemoglobin around 14 on 05/28/2019. The initial drop is secondary to dilution.  hemoglobin is stable Concern is for possible GI bleed with melena. With normal platelets hemolysis is less likely.  CT abdomen pelvis negative for any active bleeding.  Appreciate GI input.  We will keep the  patient n.p.o.  IV Protonix every 12 hours. INR elevated patient is on Coumadin at home INR is trending up secondary to sepsis as well as inability to tolerate p.o. Vitamin K will be given as well.  6.  Chronic atrial fibrillation. With RVR. Heart rate currently in 120s. On oral Cardizem and metoprolol at home. Patient was initially on Cardizem drip. IV Lopressor was added this as needed which was changed to scheduled Lopressor. Due to persistent hypertension clonidine 0.1 mg clonidine patch was also added. Since the patient is placed on BiPAP her blood pressure has remained softer. Initially discontinue scheduled IV Lopressor. Now discontinued clonidine patch as well as IV Cardizem drip. Transitioning to IV amiodarone drip for now. We will consult cardiology for further assistance once other metabolic medical work-up is completed. Echocardiogram again showed preserved EF without any significant wall motion abnormalities.  7.  Acute metabolic encephalopathy. In the setting of sepsis. CT head unremarkable. EEG did not show any evidence of acute seizures and showed metabolic encephalopathy. Metabolic work-up including TSH free T4, ammonia, B12 level normal. Mentation improved significantly from the day of admission. UDS is also only showing opiates which the patient is actually receiving in the hospital. Discussed with son patient does have some baseline memory deficit and may be having some mild delirium in the setting of sepsis. Continue to monitor.  8.  Type 2 diabetes mellitusPoorly controlled with hyperglycemia Hemoglobin A1c 7.5 in February 24, 2019. Currently hyperglycemia secondary to steroids. Patient is on sensitive sliding scale every 4 hours.  We will increase  coverage while the patient is on steroids. Patient does have anion gap acidosis may require IV insulin.  We will recheck further work-up for now before initiating therapy for DKA  9.  Dysphagia. Poor p.o. intake. May  require CorPak tube placement. Currently speech therapy has been consulted.  The patient is unable to perform or participate in care secondary to respiratory distress as well as other medical conditions. Currently n.p.o. in the setting of GI bleed. Will monitor.  10.  Obesity. Body mass index is 35.68 kg/m.  Continue to monitor for now.  11.  Hypokalemia. Replacing IV for now.  Diet: NPO in the setting of acute pancreatitis as well as GI bleed and dysphagia DVT Prophylaxis: SCD, pharmacological prophylaxis contraindicated due to Supratherapeutic INR and possibility of GI bleed  Advance goals of care discussion: Full code  Family Communication: no family was present at bedside, at the time of interview.  Discussed with son on 05/29/2019.  Attempt to reach out to son x2 on 05/30/2019.  Left voicemail.  Disposition:  Discharge to to be determined.    Consultants: gastroenterology, IR Procedures: none  Scheduled Meds:  Continuous Infusions:  PRN Meds: acetaminophen **OR** acetaminophen, HYDROmorphone (DILAUDID) injection, lidocaine (PF), LORazepam, metoprolol tartrate, ondansetron **OR** ondansetron (ZOFRAN) IV, sodium chloride flush, sodium chloride HYPERTONIC Antibiotics: Anti-infectives (From admission, onward)   Start     Dose/Rate Route Frequency Ordered Stop   05/29/19 1000  meropenem (MERREM) 500 mg in sodium chloride 0.9 % 100 mL IVPB     500 mg 200 mL/hr over 30 Minutes Intravenous Every 12 hours 05/29/19 0805     05/28/19 1700  Ampicillin-Sulbactam (UNASYN) 3 g in sodium chloride 0.9 % 100 mL IVPB  Status:  Discontinued     3 g 200 mL/hr over 30 Minutes Intravenous Every 12 hours 05/28/19 0735 05/29/19 0805   05/27/19 1200  Ampicillin-Sulbactam (  UNASYN) 3 g in sodium chloride 0.9 % 100 mL IVPB  Status:  Discontinued     3 g 200 mL/hr over 30 Minutes Intravenous Every 8 hours 05/27/19 1044 05/28/19 0735   05/23/19 1600  piperacillin-tazobactam (ZOSYN) IVPB 3.375 g  Status:   Discontinued     3.375 g 12.5 mL/hr over 240 Minutes Intravenous Every 8 hours 05/23/19 0933 05/27/19 1044   05/23/19 0800  piperacillin-tazobactam (ZOSYN) IVPB 3.375 g     3.375 g 100 mL/hr over 30 Minutes Intravenous  Once 05/23/19 0141 05/23/19 0911   05/23/19 0100  piperacillin-tazobactam (ZOSYN) IVPB 3.375 g     3.375 g 100 mL/hr over 30 Minutes Intravenous  Once 05/23/19 0059 05/23/19 0241       Objective: Physical Exam:  Filed Weights   05/22/19 2216 05/23/19 0355 05/31/19 0438  Weight: 99.8 kg 92.9 kg 94.3 kg   General: alert and oriented to time, place, and person. Appear in severe distress, affect appropriate Eyes: PERRL, Conjunctiva normal ENT: Oral Mucosa Clear, dry  Neck: difficult to assess  JVD, no Abnormal Mass Or lumps Cardiovascular: S1 and S2 Present, no Murmur, peripheral pulses symmetrical Respiratory: Increased respiratory effort, Bilateral Air entry equal and Decreased, positive use of accessory muscle, bilateral basal crackles, no wheezes Abdomen: Bowel Sound absent, Soft and epigastric tenderness, no hernia Skin: no rashes  Extremities: no Pedal edema, no calf tenderness Neurologic: normal without focal findings, mental status, speech normal, alert and oriented x3, PERLA, Motor strength 5/5 and symmetric and sensation grossly normal to light touch Gait not checked due to patient safety concerns  Data Reviewed: CBC: Lab 05/27/19 0911  05/29/19 1035  05/30/19 0500 05/30/19 1130 05/30/19 1700 05/31/19 0319  WBC 17.5*   < > 31.0*   < > 24.7* 27.8* 36.7* 29.4*  NEUTROABS 15.4*  --  27.2*  --   --   --   --   --   HGB 15.1*   < > 11.1*   < > 8.6* 8.9* 9.4* 8.8*  HCT 44.6   < > 33.9*   < > 26.0* 26.4* 28.1* 26.7*  MCV 89.2   < > 91.4   < > 91.5 90.1 90.9 91.8  PLT 236   < > 303   < > 233 238 268 676   Basic Metabolic Panel: Lab 72/09/47 0911  05/30/19 1130 05/30/19 1700 05/31/19 1028  NA 148*   < > 151* 152* 155*  K 2.9*   < > 2.6* 3.1* 3.7   CL 112*   < > 116* 119* 123*  CO2 21*   < > 18* 20* 21*  GLUCOSE 200*   < > 321* 269* 227*  BUN 27*   < > 117* 108* 92*  CREATININE 1.17*   < > 3.26* 2.96* 2.39*  CALCIUM 8.0*   < > 8.1* 8.0* 8.6*  MG 1.8  --   --   --  2.6*    Liver Function Tests: Lab 05/26/19 0814 05/27/19 0911 05/29/19 1035 05/31/19 1028  AST 22 17 29 22   ALT 24 23 35 22  ALKPHOS 63 72 79 68  BILITOT 1.1 1.3* 0.9 0.6  PROT 5.6* 6.0* 5.4* 5.7*  ALBUMIN 2.4* 2.5* 2.1* 2.8*   Recent Labs  Lab 05/29/19 1035  LIPASE 32   No results for input(s): AMMONIA in the last 168 hours. Coagulation Profile: Lab 05/29/19 0533 05/29/19 1617 05/30/19 0500 05/31/19 0319  INR 8.2* 2.0* 1.4* 1.4*   Cardiac Enzymes:  No results for input(s): CKTOTAL, CKMB, CKMBINDEX, TROPONINI in the last 168 hours. BNP (last 3 results) No results for input(s): PROBNP in the last 8760 hours.  Studies:    Time spent: 35 minutes  Author: Berle Mull, MD [facility] 05/31/2019 7:21 PM  To reach On-call, see care teams to locate the attending and reach out to them via [email or web address]. If 7PM-7AM, please contact night-coverage If you still have difficulty reaching the attending provider, please page the [facility] (Director on Call) for [facility] on amion for assistance.

## 2019-06-01 NOTE — Progress Notes (Addendum)
Daily Rounding Note  06/01/2019, 9:49 AM  LOS: 9 days   SUBJECTIVE:   Chief complaint:     Acute resp failure, Hypoxemia late afternoon into evening yesterday.   Feels tired.   Remains NPO  OBJECTIVE:         Vital signs in last 24 hours:    Temp:  [97 F (36.1 C)-97.3 F (36.3 C)] 97 F (36.1 C) (06/10 0419) Pulse Rate:  [27-146] 118 (06/10 0801) Resp:  [20-30] 22 (06/10 0801) BP: (127-149)/(75-110) 149/76 (06/10 0801) SpO2:  [69 %-100 %] 96 % (06/10 0801) FiO2 (%):  [50 %-100 %] 100 % (06/10 0801) Last BM Date: 05/31/19 Filed Weights   05/22/19 2216 05/23/19 0355 05/31/19 0438  Weight: 99.8 kg 92.9 kg 94.3 kg   General: looks better but not well.  alert   Heart: RRR Chest: shallow breathing, course BS throughout.  SOB with speaking.  Hiss of high flow [place] oxygen audible from several feet away.   Abdomen: soft, active BS, NT.  Dark liquid stool in flexi-seal, not melena  Extremities: edema UE, R > L.  No LE edema Neuro/Psych:  Alert, pleasant, cooperative, appropriate.  Moves all 4s  Intake/Output from previous day: 06/09 0701 - 06/10 0700 In: 543.6 [I.V.:133.6; IV Piggyback:160] Out: 0   Intake/Output this shift: Total I/O In: -  Out: 700 [Urine:700]  Lab Results: Recent Labs    05/30/19 1700 05/31/19 0319 06/01/19 0641  WBC 36.7* 29.4* 27.0*  HGB 9.4* 8.8* 9.6*  HCT 28.1* 26.7* 29.8*  PLT 268 229 251   BMET Recent Labs    05/30/19 1700 05/31/19 1028 06/01/19 0641  NA 152* 155* 156*  K 3.1* 3.7 3.6  CL 119* 123* 123*  CO2 20* 21* 22  GLUCOSE 269* 227* 192*  BUN 108* 92* 73*  CREATININE 2.96* 2.39* 1.97*  CALCIUM 8.0* 8.6* 8.5*   LFT Recent Labs    05/29/19 1035 05/31/19 1028 06/01/19 0641  PROT 5.4* 5.7* 5.4*  ALBUMIN 2.1* 2.8* 2.7*  AST 29 22 19   ALT 35 22 22  ALKPHOS 79 68 59  BILITOT 0.9 0.6 0.7   PT/INR Recent Labs    05/31/19 0319 06/01/19 0641  LABPROT 17.1*  16.6*  INR 1.4* 1.4*   Hepatitis Panel No results for input(s): HEPBSAG, HCVAB, HEPAIGM, HEPBIGM in the last 72 hours.  Studies/Results: Dg Chest 1 View  Result Date: 05/31/2019 CLINICAL DATA:  Status post left thoracentesis. EXAM: CHEST  1 VIEW COMPARISON:  Radiograph of same day. FINDINGS: Mild cardiomegaly is noted. Left pleural effusion is nearly resolved status post thoracentesis. No definite pneumothorax is noted. Stable right lung opacities are noted. Bony thorax is unremarkable. IMPRESSION: Left pleural effusion is nearly resolved status post thoracentesis. No pneumothorax is noted. Electronically Signed   By: Marijo Conception M.D.   On: 05/31/2019 13:55   Dg Chest Port 1 View  Result Date: 06/01/2019 CLINICAL DATA:  Shortness of breath EXAM: PORTABLE CHEST 1 VIEW COMPARISON:  05/31/2019 FINDINGS: Cardiac shadow is stable. Aortic calcifications are again seen. Lungs are well aerated bilaterally. Small pleural effusions are again noted. Patchy infiltrate is noted within the right lung. IMPRESSION: Stable appearance when compare with previous day. Electronically Signed   By: Inez Catalina M.D.   On: 06/01/2019 09:14   Dg Chest Port 1 View  Result Date: 05/31/2019 CLINICAL DATA:  Oxygen desaturation, diabetes mellitus, hypertension EXAM: PORTABLE CHEST 1 VIEW COMPARISON:  Portable exam 1707 hours compared to 05/31/2019 at 1335 hours FINDINGS: Upper normal heart size. Mediastinal contours and pulmonary vascularity normal. Persistent atelectasis and minimal pleural effusion at LEFT base. Minimal patchy infiltrate at RIGHT base. Remaining lungs clear. No pneumothorax identified, though portions of apices medially are obscured by patient's chin. Bones demineralized. IMPRESSION: Persistent LEFT pleural effusion and basilar atelectasis. Mild RIGHT basilar infiltrate. Electronically Signed   By: Lavonia Dana M.D.   On: 05/31/2019 17:28   Dg Chest Port 1 View  Result Date: 05/31/2019 CLINICAL DATA:   Shortness of breath EXAM: PORTABLE CHEST 1 VIEW COMPARISON:  05/30/2019 FINDINGS: Worsening aeration on the left with near complete opacification of the left hemithorax, likely a combination of effusion and atelectasis. Patchy airspace disease noted in the right perihilar region. No visible effusion on the right. No acute bony abnormality. IMPRESSION: Worsening aeration on the left, likely a combination of effusion and airspace disease/atelectasis with near complete opacification of the left hemithorax. Patchy right perihilar opacities. Electronically Signed   By: Rolm Baptise M.D.   On: 05/31/2019 08:48   Ir Thoracentesis Asp Pleural Space W/img Guide  Result Date: 05/31/2019 INDICATION: Patient with history of acute on chronic diastolic HF, acute hypoxic respiratory distress, and bilateral pleural effusions (right side drained 05/30/2019 yielding 1.2 L). Request is made for diagnostic and therapeutic left thoracentesis. EXAM: ULTRASOUND GUIDED DIAGNOSTIC AND THERAPEUTIC LEFT THORACENTESIS MEDICATIONS: 15 mL 1% lidocaine COMPLICATIONS: None immediate. PROCEDURE: An ultrasound guided thoracentesis was thoroughly discussed with the patient and questions answered. The benefits, risks, alternatives and complications were also discussed. The patient understands and wishes to proceed with the procedure. Written consent was obtained. Ultrasound was performed to localize and mark an adequate pocket of fluid in the left chest. The area was then prepped and draped in the normal sterile fashion. 1% Lidocaine was used for local anesthesia. Under ultrasound guidance a 6 Fr Safe-T-Centesis catheter was introduced. Thoracentesis was performed. The catheter was removed and a dressing applied. FINDINGS: A total of approximately 650 mL of hazy gold fluid was removed. Samples were sent to the laboratory as requested by the clinical team. IMPRESSION: Successful ultrasound guided left thoracentesis yielding 650 mL of pleural fluid.  Read by: Earley Abide, PA-C Electronically Signed   By: Jerilynn Mages.  Shick M.D.   On: 05/31/2019 14:27   Scheduled Meds: . diltiazem  15 mg Intravenous Once  . insulin aspart  0-20 Units Subcutaneous Q4H  . ipratropium-albuterol  3 mL Nebulization Q6H  . lidocaine      . pantoprazole (PROTONIX) IV  40 mg Intravenous Q12H  . sodium chloride flush  10-40 mL Intracatheter Q12H   Continuous Infusions: . amiodarone 30 mg/hr (06/01/19 0630)  . diltiazem (CARDIZEM) infusion    . meropenem (MERREM) IV 500 mg (05/31/19 2123)   PRN Meds:.acetaminophen **OR** acetaminophen, HYDROmorphone (DILAUDID) injection, lidocaine (PF), LORazepam, metoprolol tartrate, ondansetron **OR** ondansetron (ZOFRAN) IV, sodium chloride flush, sodium chloride HYPERTONIC   ASSESMENT:   *  UGIB with dark ?melenic, stool in setting INR 8.2. Waiting on H pylori serologies.       *   Normocytic anemia, acute.  Hgb stable. Not iron dificient.     *   Coagulopathy remains corrected.    *   Acute, presumed biliary, pancreatitis.  Meropenem day 4, WBCs improved.    *   Pleural effusions.  Thoracentesis  6/8 and 6/9.   Pending studies include Echo bubble study, LE dopplers.    *  Afib, RVR.    *    Dysphagia, previous esoph strictures. NPO per SLP rec.     *   Chronic diarrhea, cause indeterminate.  03/2018 Colonoscopy.  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  *   AKI, improved.  Baseline CKD 3.    *   Hypernatremia.     PLAN   *  Hopefully SLP can clear her to take po.  If not, need to consider Cor track feeding tube.  Once getting po or FT place, can switch PPI to enteric.      Melanie [name]  06/01/2019, 9:49 AM Phone [phone]   Attending physician's note   I have taken an interval history, reviewed the chart and examined the patient. I agree with the Advanced Practitioner's note, impression and recommendations.  Hypoxemic respiratory failure, on BiPAP at night and high flow nasal  cannula during daytime Acute pancreatitis, elevated leukocytosis and lactic acid Hemoglobin remained stable after correction of INR.  No plan for endoscopic evaluation at this point Continue IV fluids and pain control Speech pathologist evaluated patient this a.m. Okay to do sips of water/clears as tolerated  We will continue to follow  K. Denzil Magnuson , MD [phone]

## 2019-06-01 NOTE — Consult Note (Signed)
Cardiology Consult    Patient ID: Melanie [name] MRN: [id number], DOB/AGE: 08/06/1941   Admit date: 05/22/2019 Date of Consult: 06/01/2019  Primary Physician: [name], MD Primary Cardiologist: Sherren Mocha, MD Requesting Provider: Berle Mull, MD  Patient Profile    Melanie [name] is a 78 y.o. 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 Posey Pronto.  History of Present Illness    Melanie [name] is a 78 year old female with the above history who is followed by Dr. Burt Knack. Patient was last seen by Roderic Palau, NP, in the [facility] on 02/22/2019 at which time she was in rate controlled flutter. Patient asymptomatic and denied any cardiac symptoms at that time. Lopressor was increased from 25mg  to 37.5mg  twice daily. Cardizem 90mg  twice daily and Coumadin were continued.   Patient presented to ED on 05/22/2019 via EMS for severe abdominal pain and vomiting. CT revealed acute pancreatitis as well as cholelithiasis. Patient was admitted and started on IV fluids and IV Zosyn. Lipase was over 5000. AST and ALT normal. RUQ ultrasound showed cholelithiasis but no evidence of acute cholecystitis. MRCP showed acute pancreatitis but no evidence of pancreatic necrosis. Also showed several gallstones within the gallbladder but no cholelithiasis and no biliary obstruction. Hospital course complicated by acute encephalopathy, AKI, and acute hypoxic respiratory failure felt to be secondary to acute on chronic diastolic CHF due to IV fluids. Patient was started on IV Lasix on 05/25/2019. Echo on 05/27/2019 showed LVEF of >65%.  Per Rapid Response note on 05/28/2019, patient had increased work of breathing with respirations of 45 and SpO2 of 95 to 98% on BiPAP. Repeat chest x-ray showed bibasilar opacities that may  reflect moderate bilateral pleural effusions with areas of atelectasis and/or consolidation. Systolic BP ended up dropping to the 70's to 80's. IV Cardizem for atrial fibrillation/flutter with RVR was discontinued and patient was started on slow fluid bolus and IV Amiodarone. Patient ended up have right thoracentesis yielding 1.2 L of pleural fluid on 05/30/2019 and a left thoracentesis yielding 650 mL of pleural fluid on 05/31/2019. Following procedure, patient exhibited increasing O2 requirements requiring non-rebreather mask and then BiPAP. PCCM was consulted to assist with acute respiratory failure with hypoxia. Patient started on high flow O2. Echo with bubble study has been ordered to evaluate for possible shunt.  Patient also noted to have black loose stools the past several days. Hemoccult was positive. GI consulted for GI bleed. EGD was initially planned for yesterday but this was cancelled due to worsening respiratory status. Cardiology was consulted for atrial fibrillation. Currently on IV Amiodarone. IV Cardizem restarted today. Was on IV Heparin but this has been discontinued due to concern of active GI bleed.   Prior to hospitalization, patient was doing well from a cardiac standpoint. At the time of this evaluation, patient is on high flow O2 at 40%. She states she is very tired and wants to take a nap. Her abdominal pain has improved and she denies any nausea/vomiting. She was started on a clear liquid diet today. She reports feeling better after thoracentesis. She still has some shortness of breath at rest and is sleeping on an incline but states breathing has improved. She does have some mild lower extremity edema. She initially reported some chest discomfort but when asked to explain she said, "I just feel exhausted."  Patient notes some palpitations and lightheadedness/dizziness but no syncope. She is sill have loose black stools but no frank bleeding. No hemoptysis or hematuria.   Past Medical  History   Past Medical History:  Diagnosis Date   Allergic rhinitis    Anticoagulated on Coumadin    Arthritis    Diabetes mellitus    GERD (gastroesophageal reflux disease)    Hyperlipidemia    Hypertension    Pain in joint, ankle and foot 09/14/2013    Past Surgical History:  Procedure Laterality Date   COLONOSCOPY     03/2018, removed polyps   FRACTURE SURGERY Left     leg   hiatal hernia surgery  2010   IR THORACENTESIS ASP PLEURAL SPACE W/IMG GUIDE  05/30/2019   IR THORACENTESIS ASP PLEURAL SPACE W/IMG GUIDE  05/31/2019   ROTATOR CUFF REPAIR Right 2010   TONSILLECTOMY       Allergies  Allergies  Allergen Reactions   Niacin And Related Rash   Keflex [Cephalexin] Other (See Comments)    Fatigue, sore throat, body aches   Sausage [Pickled Meat] Other (See Comments)    Drowsiness and dizziness   Tape Rash    Bandaids     Inpatient Medications     feeding supplement (ENSURE ENLIVE)  237 mL Oral BID BM   insulin aspart  0-20 Units Subcutaneous Q4H   ipratropium-albuterol  3 mL Nebulization Q6H   lidocaine       pantoprazole (PROTONIX) IV  40 mg Intravenous Q12H   sodium chloride flush  10-40 mL Intracatheter Q12H    Family History    Family History  Problem Relation Age of Onset   Colon cancer Brother 40   Colon cancer Maternal Aunt 50   Breast cancer Paternal Grandmother 28   Breast cancer Maternal Aunt 28   Diabetes Maternal Aunt    Heart disease Mother    Stomach cancer Neg Hx    She indicated that the status of her mother is unknown. She indicated that her brother is alive. She indicated that the status of her paternal grandmother is unknown. She indicated that one of her three maternal aunts is deceased. She indicated that the status of her neg hx is unknown.   Social History    Social History   Socioeconomic History   Marital status: Widowed    Spouse name: Not on file   Number of children: 3   Years of  education: Not on file   Highest education level: Not on file  Occupational History   Not on file  Social Needs   Financial resource strain: Not on file   Food insecurity:    Worry: Not on file    Inability: Not on file   Transportation needs:    Medical: Not on file    Non-medical: Not on file  Tobacco Use   Smoking status: Never Smoker   Smokeless tobacco: Never Used  Substance and Sexual Activity   Alcohol use: No   Drug use: No   Sexual activity: Not on file  Lifestyle   Physical activity:    Days per week: Not on file    Minutes per session: Not on file   Stress: Not on file  Relationships   Social connections:    Talks on phone: Not on file    Gets together: Not on file    Attends religious service: Not on file    Active member of club or organization: Not on file  Attends meetings of clubs or organizations: Not on file    Relationship status: Not on file   Intimate partner violence:    Fear of current or ex partner: Not on file    Emotionally abused: Not on file    Physically abused: Not on file    Forced sexual activity: Not on file  Other Topics Concern   Not on file  Social History Narrative   Not on file     Review of Systems    Please see HPI. Otherwise, negative.  Physical Exam    Blood pressure 131/78, pulse 83, temperature (!) 97 F (36.1 C), temperature source Axillary, resp. rate (!) 25, height 5\' 4"  (1.626 m), weight 94.3 kg, SpO2 96 %.  General: 78 y.o. female resting comfortably on high flow O2 but in no acute distress. HEENT: Normal  Neck: Supple.  Lungs: Mild increased work of breathing. Course breath sounds throughout with crackles noted in bases. No wheezing. Heart: Tachycardic with irregularly irregular rhythm. Distinct S1 and S2. No significant murmurs, gallops, or rubs.  Abdomen: Soft, non-distended, and non-tender to palpation. Bowel sounds present. Extremities: Mild bilateral lower extremity edema. Radial and  distal pedal pulses 2+ and equal bilaterally. Skin: Warm and dry. Neuro: Alert and oriented x3. No focal deficits. Moves all extremities spontaneously but very weak. Psych: Normal affect. Responds appropriately.  Labs    Troponin (Point of Care Test) No results for input(s): TROPIPOC in the last 72 hours. No results for input(s): CKTOTAL, CKMB, TROPONINI in the last 72 hours. Lab Results  Component Value Date   WBC 27.0 (H) 06/01/2019   HGB 9.6 (L) 06/01/2019   HCT 29.8 (L) 06/01/2019   MCV 92.8 06/01/2019   PLT 251 06/01/2019    Recent Labs  Lab 06/01/19 0641  NA 156*  K 3.6  CL 123*  CO2 22  BUN 73*  CREATININE 1.97*  CALCIUM 8.5*  PROT 5.4*  BILITOT 0.7  ALKPHOS 59  ALT 22  AST 19  GLUCOSE 192*   Lab Results  Component Value Date   CHOL 245 (H) 11/25/2018   HDL 36.30 (L) 11/25/2018   LDLCALC 144 (H) 09/16/2017   TRIG 230.0 (H) 11/25/2018   No results found for: [facility]   Radiology Studies    Ct Abdomen Pelvis Wo Contrast  Result Date: 05/29/2019 CLINICAL DATA:  78 year old female currently mid with acute pancreatitis and sepsis of unknown origin. Significant breathing issues overnight as well as increasing epigastric abdominal pain, fever and leukocytosis. EXAM: CT CHEST, ABDOMEN AND PELVIS WITHOUT CONTRAST TECHNIQUE: Multidetector CT imaging of the chest, abdomen and pelvis was performed following the standard protocol without IV contrast. COMPARISON:  Most recent prior CT scan of the abdomen and pelvis 05/23/2019 FINDINGS: CT CHEST FINDINGS Cardiovascular: Limited evaluation in the absence of intravenous contrast. Atherosclerotic calcifications present along the thoracic aorta. Persistent biatrial enlargement. Calcification of the aortic valve and mitral valve annulus. No pericardial effusion. Mediastinum/Nodes: Unremarkable esophagus. No suspicious mediastinal mass or adenopathy. There is a moderate hiatal hernia. Fluid is present in the mid and distal esophagus.  Lungs/Pleura: Moderate to large layering right pleural effusion and moderate layering left pleural effusion with associated bibasilar atelectasis. No evidence of pulmonary edema. The aerated portions of the lungs remain relatively clear. A few small scattered foci of ground-glass attenuation opacity likely reflect areas of trace atelectasis. There is mild atelectasis in the inferior aspect of the right middle lobe. Musculoskeletal: No acute fracture or aggressive appearing lytic or  blastic osseous lesion. CT ABDOMEN PELVIS FINDINGS Hepatobiliary: Normal hepatic contour and morphology. No discrete hepatic lesion. No intra or extrahepatic biliary ductal dilatation. High attenuation material layers within the gallbladder lumen consistent with small stones. No gallbladder distention or pericholecystic fluid. Pancreas: Similar appearance of the pancreas which is edematous and poorly defined consistent with pancreatitis. Perhaps slightly increased peripancreatic fluid and edema adjacent to the pancreatic tail compared to recent prior imaging. The remainder of the edema along the body and head appears similar. No new pseudocyst or gas to suggest pancreatic necrosis. Secondary inflammatory thickening of the first and second portions of the duodenum again noted. Incidental note is made of a duodenal diverticulum, unchanged. Spleen: Normal in size without focal abnormality. Adrenals/Urinary Tract: The adrenal glands are unremarkable. No evidence of hydronephrosis or nephrolithiasis. Stable simple right renal cyst. The ureters are unremarkable. A Foley catheter is present within the collapsed bladder. Stomach/Bowel: A rectal tube is present. Colonic diverticular disease without CT evidence of active inflammation. No evidence of bowel obstruction. Vascular/Lymphatic: Atherosclerotic calcifications throughout the abdominal aorta. No aneurysm. Limited evaluation in the absence of intravenous contrast. No suspicious mediastinal  lymphadenopathy. Reproductive: Uterus and bilateral adnexa are unremarkable. Other: No intraperitoneal ascites or intra-abdominal abscess collection identified. Musculoskeletal: No acute fracture or aggressive appearing lytic or blastic osseous lesion. Mild dependent body wall edema. IMPRESSION: CT CHEST 1. Moderate to large bilateral pleural effusions slightly larger on the right than the left with associated bilateral lower lobe atelectasis. This may be contributing to the patient's underlying shortness of breath. 2. Otherwise, the lungs are fairly clear. No evidence of significant pneumonia or pneumonitis. 3. Moderate hiatal hernia with fluid filling the mid and distal esophagus. This is concerning for reflux versus poor esophageal motility and could represent a potential aspiration risk. 4. Persistent cardiomegaly with biatrial enlargement. 5.  Aortic Atherosclerosis (ICD10-170.0) CT ABD/PELVIS 1. Persistent findings of acute/subacute pancreatitis. Perhaps slightly more inflammatory stranding and fluid adjacent to the pancreatic tail but no new pseudocyst or findings to suggest pancreatic necrosis. 2. Persistent but perhaps slightly improved secondary inflammatory thickening of the proximal duodenum. 3. No evidence of abdominal ascites, abscess or other acute abnormality. 4. Cholelithiasis without imaging findings to suggest acute cholecystitis. 5. Rectal tube and Foley catheter in place. 6. Additional ancillary findings as above without significant interval change. Signed, Criselda Peaches, MD, [facility] Radiology Electronically Signed   By: Jacqulynn Cadet M.D.   On: 05/29/2019 09:03   Dg Chest 1 View  Result Date: 05/31/2019 CLINICAL DATA:  Status post left thoracentesis. EXAM: CHEST  1 VIEW COMPARISON:  Radiograph of same day. FINDINGS: Mild cardiomegaly is noted. Left pleural effusion is nearly resolved status post thoracentesis. No definite  pneumothorax is noted. Stable right lung opacities are noted. Bony thorax is unremarkable. IMPRESSION: Left pleural effusion is nearly resolved status post thoracentesis. No pneumothorax is noted. Electronically Signed   By: Marijo Conception M.D.   On: 05/31/2019 13:55   Dg Chest 1 View  Result Date: 05/30/2019 CLINICAL DATA:  Status post right thoracentesis EXAM: CHEST  1 VIEW COMPARISON:  05/28/2019 FINDINGS: Cardiac shadow is stable. Aortic calcifications are again seen. Mild right basilar atelectasis is noted although resolution of right-sided pleural effusion is seen. No pneumothorax is noted. Persistent left-sided effusion is seen. IMPRESSION: No pneumothorax following right thoracentesis. Stable left pleural effusion is noted. Electronically Signed   By: Inez Catalina M.D.   On: 05/30/2019 09:43  Ct Head Wo Contrast  Result Date: 05/25/2019 CLINICAL DATA:  Altered mental status. EXAM: CT HEAD WITHOUT CONTRAST TECHNIQUE: Contiguous axial images were obtained from the base of the skull through the vertex without intravenous contrast. COMPARISON:  12/01/2012. FINDINGS: Brain: No evidence of acute infarction, hemorrhage, hydrocephalus, extra-axial collection or mass lesion/mass effect. Generalized atrophy. Chronic microvascular ischemic change. Vascular: Calcification of the cavernous internal carotid arteries consistent with cerebrovascular atherosclerotic disease. No signs of intracranial large vessel occlusion. Skull: Calvarium intact. Sinuses/Orbits: No acute finding.  Chronic sphenoid sinus disease. Other: None IMPRESSION: Atrophy and small vessel disease. No acute intracranial findings. Similar appearance to priors. Electronically Signed   By: Staci Righter M.D.   On: 05/25/2019 14:12   Ct Chest Wo Contrast  Result Date: 05/29/2019 CLINICAL DATA:  78 year old female currently mid with acute pancreatitis and sepsis of unknown origin. Significant breathing issues overnight as well as increasing  epigastric abdominal pain, fever and leukocytosis. EXAM: CT CHEST, ABDOMEN AND PELVIS WITHOUT CONTRAST TECHNIQUE: Multidetector CT imaging of the chest, abdomen and pelvis was performed following the standard protocol without IV contrast. COMPARISON:  Most recent prior CT scan of the abdomen and pelvis 05/23/2019 FINDINGS: CT CHEST FINDINGS Cardiovascular: Limited evaluation in the absence of intravenous contrast. Atherosclerotic calcifications present along the thoracic aorta. Persistent biatrial enlargement. Calcification of the aortic valve and mitral valve annulus. No pericardial effusion. Mediastinum/Nodes: Unremarkable esophagus. No suspicious mediastinal mass or adenopathy. There is a moderate hiatal hernia. Fluid is present in the mid and distal esophagus. Lungs/Pleura: Moderate to large layering right pleural effusion and moderate layering left pleural effusion with associated bibasilar atelectasis. No evidence of pulmonary edema. The aerated portions of the lungs remain relatively clear. A few small scattered foci of ground-glass attenuation opacity likely reflect areas of trace atelectasis. There is mild atelectasis in the inferior aspect of the right middle lobe. Musculoskeletal: No acute fracture or aggressive appearing lytic or blastic osseous lesion. CT ABDOMEN PELVIS FINDINGS Hepatobiliary: Normal hepatic contour and morphology. No discrete hepatic lesion. No intra or extrahepatic biliary ductal dilatation. High attenuation material layers within the gallbladder lumen consistent with small stones. No gallbladder distention or pericholecystic fluid. Pancreas: Similar appearance of the pancreas which is edematous and poorly defined consistent with pancreatitis. Perhaps slightly increased peripancreatic fluid and edema adjacent to the pancreatic tail compared to recent prior imaging. The remainder of the edema along the body and head appears similar. No new pseudocyst or gas to suggest pancreatic  necrosis. Secondary inflammatory thickening of the first and second portions of the duodenum again noted. Incidental note is made of a duodenal diverticulum, unchanged. Spleen: Normal in size without focal abnormality. Adrenals/Urinary Tract: The adrenal glands are unremarkable. No evidence of hydronephrosis or nephrolithiasis. Stable simple right renal cyst. The ureters are unremarkable. A Foley catheter is present within the collapsed bladder. Stomach/Bowel: A rectal tube is present. Colonic diverticular disease without CT evidence of active inflammation. No evidence of bowel obstruction. Vascular/Lymphatic: Atherosclerotic calcifications throughout the abdominal aorta. No aneurysm. Limited evaluation in the absence of intravenous contrast. No suspicious mediastinal lymphadenopathy. Reproductive: Uterus and bilateral adnexa are unremarkable. Other: No intraperitoneal ascites or intra-abdominal abscess collection identified. Musculoskeletal: No acute fracture or aggressive appearing lytic or blastic osseous lesion. Mild dependent body wall edema. IMPRESSION: CT CHEST 1. Moderate to large bilateral pleural effusions slightly larger on the right than the left with associated bilateral lower lobe atelectasis. This may be contributing to the patient's underlying shortness of breath. 2. Otherwise, the  lungs are fairly clear. No evidence of significant pneumonia or pneumonitis. 3. Moderate hiatal hernia with fluid filling the mid and distal esophagus. This is concerning for reflux versus poor esophageal motility and could represent a potential aspiration risk. 4. Persistent cardiomegaly with biatrial enlargement. 5.  Aortic Atherosclerosis (ICD10-170.0) CT ABD/PELVIS 1. Persistent findings of acute/subacute pancreatitis. Perhaps slightly more inflammatory stranding and fluid adjacent to the pancreatic tail but no new pseudocyst or findings to suggest pancreatic necrosis. 2. Persistent but perhaps slightly improved  secondary inflammatory thickening of the proximal duodenum. 3. No evidence of abdominal ascites, abscess or other acute abnormality. 4. Cholelithiasis without imaging findings to suggest acute cholecystitis. 5. Rectal tube and Foley catheter in place. 6. Additional ancillary findings as above without significant interval change. Signed, Criselda Peaches, MD, [facility] Radiology Electronically Signed   By: Jacqulynn Cadet M.D.   On: 05/29/2019 09:03   Ct Abdomen Pelvis W Contrast  Result Date: 05/23/2019 CLINICAL DATA:  78 year old female with abdominal pain, nausea vomiting. History of GERD and diabetes and hiatal hernia. EXAM: CT ABDOMEN AND PELVIS WITH CONTRAST TECHNIQUE: Multidetector CT imaging of the abdomen and pelvis was performed using the standard protocol following bolus administration of intravenous contrast. CONTRAST:  57mL OMNIPAQUE IOHEXOL 300 MG/ML  SOLN COMPARISON:  None. FINDINGS: Lower chest: Minimal bibasilar dependent atelectatic changes. There is coronary vascular calcification involving the left circumflex artery. No intra-abdominal free air. Small perihepatic ascites. Hepatobiliary: The liver is unremarkable. There is mild periportal edema. The gallbladder is distended. Small amount of layering stone noted within the gallbladder. No calcified stone identified in the central CBD. Pancreas: There is inflammatory changes of the pancreas with peripancreatic edema most consistent with acute pancreatitis. No abscess or pseudocyst. Spleen: Normal in size without focal abnormality. Adrenals/Urinary Tract: The adrenal glands are unremarkable. There is a 3 cm right renal upper pole cyst as well as additional subcentimeter renal hypodensities which are too small to characterize. There is no hydronephrosis on either side. There is symmetric enhancement and excretion of contrast by both kidneys. The visualized ureters and urinary bladder  appear unremarkable. Stomach/Bowel: There is a large hiatal hernia. There is a 2.7 cm diverticulum arising from the third portion of the duodenum. There is colonic diverticulosis without active inflammatory changes. There is no bowel obstruction or active inflammation. The appendix is normal. Vascular/Lymphatic: Moderate aortoiliac atherosclerotic disease. The IVC is unremarkable. No portal venous gas. There is no adenopathy. Reproductive: The uterus is grossly unremarkable. No pelvic mass. Other: None Musculoskeletal: Degenerative changes of the spine. No acute osseous pathology. IMPRESSION: 1. Acute pancreatitis. No abscess or pseudocyst. 2. Colonic diverticulosis. No bowel obstruction or active inflammation. Normal appendix. 3. Cholelithiasis. 4. Large hiatal hernia. Electronically Signed   By: Anner Crete M.D.   On: 05/23/2019 00:38   Mr 3d Recon At Scanner  Result Date: 05/24/2019 CLINICAL DATA:  Pt unable to follow breathing instructions, best possible images. Cholelithiasis Pancreatitis, acute, first episode, etiol unknown, 2-3 days out 97ml gadavist Cholelithiasis Pancreatitis, acute, first episode, etiol unknown, 2-3 days out EXAM: MRI ABDOMEN WITHOUT AND WITH CONTRAST (INCLUDING MRCP) TECHNIQUE: Multiplanar multisequence MR imaging of the abdomen was performed both before and after the administration of intravenous contrast. Heavily T2-weighted images of the biliary and pancreatic ducts were obtained, and three-dimensional MRCP images were rendered by post processing. CONTRAST:  9 mL Gadavist COMPARISON:  CT abdomen 05/23/2019 FINDINGS: Lower chest:  Small bilateral pleural effusions. Hepatobiliary: No intrahepatic  or extrahepatic biliary duct dilatation. No filling defect within the common bile duct. Common bile duct is upper limits of normal in diameter at 6 mm. Small amount of layering sludge or gallstones within the gallbladder present. No pericholecystic fluid or gallbladder distension.  Pancreas: Mild edema within the pancreas. The pancreatic contours are well maintained. There is peripancreatic fluid and fluid extending along the pericolic gutters increased from comparison CT. No organized fluid collections Postcontrast imaging demonstrates fairly uniform enhancement of the pancreatic parenchyma without evidence of necrosis. There is no pancreatic ductal dilatation. No vascular complication associated pancreatitis. Spleen: Normal spleen. Adrenals/urinary tract: Adrenal glands and kidneys are normal. Stomach/Bowel: Moderate hiatal hernia. Increased fluid from the pancreatitis extends into the hernia sac. Vascular/Lymphatic: Abdominal aortic normal caliber. No retroperitoneal periportal lymphadenopathy. Musculoskeletal: No aggressive osseous lesion IMPRESSION: 1. Acute pancreatitis with increase in peripancreatic fluid extending along the pericolic gutters. No organized collections. No evidence of pancreatic necrosis. 2. No etiology of pancreatitis. Several gallstones within the gallbladder but no cholelithiasis. No biliary obstruction. Imaging findings are not typical of autoimmune pancreatitis. 3. Bilateral pleural effusions, basilar atelectasis and moderate hiatal hernia. Electronically Signed   By: Suzy Bouchard M.D.   On: 05/24/2019 07:46   Dg Chest Port 1 View  Result Date: 06/01/2019 CLINICAL DATA:  Shortness of breath EXAM: PORTABLE CHEST 1 VIEW COMPARISON:  05/31/2019 FINDINGS: Cardiac shadow is stable. Aortic calcifications are again seen. Lungs are well aerated bilaterally. Small pleural effusions are again noted. Patchy infiltrate is noted within the right lung. IMPRESSION: Stable appearance when compare with previous day. Electronically Signed   By: Inez Catalina M.D.   On: 06/01/2019 09:14   Dg Chest Port 1 View  Result Date: 05/31/2019 CLINICAL DATA:  Oxygen desaturation, diabetes mellitus, hypertension EXAM: PORTABLE CHEST 1 VIEW COMPARISON:  Portable exam 1707 hours  compared to 05/31/2019 at 1335 hours FINDINGS: Upper normal heart size. Mediastinal contours and pulmonary vascularity normal. Persistent atelectasis and minimal pleural effusion at LEFT base. Minimal patchy infiltrate at RIGHT base. Remaining lungs clear. No pneumothorax identified, though portions of apices medially are obscured by patient's chin. Bones demineralized. IMPRESSION: Persistent LEFT pleural effusion and basilar atelectasis. Mild RIGHT basilar infiltrate. Electronically Signed   By: Lavonia Dana M.D.   On: 05/31/2019 17:28   Dg Chest Port 1 View  Result Date: 05/31/2019 CLINICAL DATA:  Shortness of breath EXAM: PORTABLE CHEST 1 VIEW COMPARISON:  05/30/2019 FINDINGS: Worsening aeration on the left with near complete opacification of the left hemithorax, likely a combination of effusion and atelectasis. Patchy airspace disease noted in the right perihilar region. No visible effusion on the right. No acute bony abnormality. IMPRESSION: Worsening aeration on the left, likely a combination of effusion and airspace disease/atelectasis with near complete opacification of the left hemithorax. Patchy right perihilar opacities. Electronically Signed   By: Rolm Baptise M.D.   On: 05/31/2019 08:48   Dg Chest Port 1 View  Result Date: 05/28/2019 CLINICAL DATA:  78 year old female with history of shortness of breath. EXAM: PORTABLE CHEST 1 VIEW COMPARISON:  Chest x-ray 05/27/2019. FINDINGS: Lung volumes are slightly low. Bibasilar opacities may reflect moderate bilateral pleural effusions with areas of atelectasis and/or consolidation throughout the lung bases bilaterally. No evidence of pulmonary edema. Mild cardiomegaly. Upper mediastinal contours are within normal limits. Aortic atherosclerosis. IMPRESSION: 1. Unchanged radiographic appearance the chest, as above. Electronically Signed   By: Vinnie Langton M.D.   On: 05/28/2019 21:50   Dg Chest [facility]  Result Date: 05/27/2019 CLINICAL DATA:   Shortness of breath and chest pressure today. EXAM: PORTABLE CHEST 1 VIEW COMPARISON:  Single-view of the chest 05/26/2019. FINDINGS: Bilateral pleural effusions and basilar atelectasis have somewhat increased. No pneumothorax. Cardiomegaly and atherosclerosis noted. IMPRESSION: Some increase in moderate to moderately large bilateral pleural effusions and basilar atelectasis. Cardiomegaly. Atherosclerosis. Electronically Signed   By: Inge Rise M.D.   On: 05/27/2019 16:27   Dg Chest Port 1 View  Result Date: 05/26/2019 CLINICAL DATA:  Increasing shortness of breath EXAM: PORTABLE CHEST 1 VIEW COMPARISON:  05/25/2019 FINDINGS: The bilateral pleural effusions have increased in size from the prior study. The heart size remains enlarged. Again noted are findings of pulmonary edema. There is no pneumothorax. Bibasilar airspace opacities are noted and are favored to represent atelectasis. IMPRESSION: Worsening volume overload as evidence by increasing bilateral pleural effusions. Electronically Signed   By: Constance Holster M.D.   On: 05/26/2019 22:52   Dg Chest Port 1 View  Result Date: 05/25/2019 CLINICAL DATA:  Shortness of breath EXAM: PORTABLE CHEST 1 VIEW COMPARISON:  05/22/2019 FINDINGS: The heart size is enlarged. There are new small bilateral pleural effusions which have increased in size from the prior study. There is no pneumothorax. Bibasilar airspace opacities are noted. There is no acute osseous abnormality. There is generalized volume overload. IMPRESSION: Developing small bilateral pleural effusions with worsening volume overload. Electronically Signed   By: Constance Holster M.D.   On: 05/25/2019 21:41   Dg Chest Port 1 View  Result Date: 05/22/2019 CLINICAL DATA:  Abdominal pain, vomiting EXAM: PORTABLE CHEST 1 VIEW COMPARISON:  12/29/2016 FINDINGS: Cardiomegaly. Moderate-sized hiatal hernia. No confluent opacities, effusions or edema. No acute bony abnormality. IMPRESSION:  Cardiomegaly.  Moderate-sized hiatal hernia.  No active disease. Electronically Signed   By: Rolm Baptise M.D.   On: 05/22/2019 23:37   Dg Abd Portable 1v  Result Date: 05/27/2019 CLINICAL DATA:  Abdominal discomfort, constipation. EXAM: PORTABLE ABDOMEN - 1 VIEW COMPARISON:  None. FINDINGS: Mild dilated air-filled stomach is noted. No definite colonic or small bowel dilatation is noted. Moderate amount of stool is noted. No abnormal calcifications are noted. IMPRESSION: Mildly dilated air-filled stomach is noted. No definite evidence of large or small bowel dilatation. Moderate stool burden. Electronically Signed   By: Marijo Conception M.D.   On: 05/27/2019 11:11   Mr Abdomen Mrcp Moise Boring Contast  Result Date: 05/24/2019 CLINICAL DATA:  Pt unable to follow breathing instructions, best possible images. Cholelithiasis Pancreatitis, acute, first episode, etiol unknown, 2-3 days out 57ml gadavist Cholelithiasis Pancreatitis, acute, first episode, etiol unknown, 2-3 days out EXAM: MRI ABDOMEN WITHOUT AND WITH CONTRAST (INCLUDING MRCP) TECHNIQUE: Multiplanar multisequence MR imaging of the abdomen was performed both before and after the administration of intravenous contrast. Heavily T2-weighted images of the biliary and pancreatic ducts were obtained, and three-dimensional MRCP images were rendered by post processing. CONTRAST:  9 mL Gadavist COMPARISON:  CT abdomen 05/23/2019 FINDINGS: Lower chest:  Small bilateral pleural effusions. Hepatobiliary: No intrahepatic or extrahepatic biliary duct dilatation. No filling defect within the common bile duct. Common bile duct is upper limits of normal in diameter at 6 mm. Small amount of layering sludge or gallstones within the gallbladder present. No pericholecystic fluid or gallbladder distension. Pancreas: Mild edema within the pancreas. The pancreatic contours are well maintained. There is peripancreatic fluid and fluid extending along the pericolic gutters increased from  comparison CT. No organized fluid collections Postcontrast imaging demonstrates fairly uniform  enhancement of the pancreatic parenchyma without evidence of necrosis. There is no pancreatic ductal dilatation. No vascular complication associated pancreatitis. Spleen: Normal spleen. Adrenals/urinary tract: Adrenal glands and kidneys are normal. Stomach/Bowel: Moderate hiatal hernia. Increased fluid from the pancreatitis extends into the hernia sac. Vascular/Lymphatic: Abdominal aortic normal caliber. No retroperitoneal periportal lymphadenopathy. Musculoskeletal: No aggressive osseous lesion IMPRESSION: 1. Acute pancreatitis with increase in peripancreatic fluid extending along the pericolic gutters. No organized collections. No evidence of pancreatic necrosis. 2. No etiology of pancreatitis. Several gallstones within the gallbladder but no cholelithiasis. No biliary obstruction. Imaging findings are not typical of autoimmune pancreatitis. 3. Bilateral pleural effusions, basilar atelectasis and moderate hiatal hernia. Electronically Signed   By: Suzy Bouchard M.D.   On: 05/24/2019 07:46   US Abdomen Limited Ruq  Result Date: 05/23/2019 CLINICAL DATA:  78 year old female with right upper quadrant abdominal pain. EXAM: ULTRASOUND ABDOMEN LIMITED RIGHT UPPER QUADRANT COMPARISON:  CT of the abdomen pelvis dated 05/23/2019 FINDINGS: Evaluation is limited due to patient's body habitus. Gallbladder: The gallbladder is distended. There is sludge and stone within the gallbladder. There is no gallbladder wall thickening or pericholecystic fluid. Negative sonographic Murphy's sign. Common bile duct: Diameter: 9 mm Liver: The liver is unremarkable as visualized. Portal vein is patent on color Doppler imaging with normal direction of blood flow towards the liver. IMPRESSION: Cholelithiasis without sonographic evidence of acute cholecystitis. Electronically Signed   By: Anner Crete M.D.   On: 05/23/2019 02:04   Ir  Thoracentesis Asp Pleural Space W/img Guide  Result Date: 05/31/2019 INDICATION: Patient with history of acute on chronic diastolic HF, acute hypoxic respiratory distress, and bilateral pleural effusions (right side drained 05/30/2019 yielding 1.2 L). Request is made for diagnostic and therapeutic left thoracentesis. EXAM: ULTRASOUND GUIDED DIAGNOSTIC AND THERAPEUTIC LEFT THORACENTESIS MEDICATIONS: 15 mL 1% lidocaine COMPLICATIONS: None immediate. PROCEDURE: An ultrasound guided thoracentesis was thoroughly discussed with the patient and questions answered. The benefits, risks, alternatives and complications were also discussed. The patient understands and wishes to proceed with the procedure. Written consent was obtained. Ultrasound was performed to localize and mark an adequate pocket of fluid in the left chest. The area was then prepped and draped in the normal sterile fashion. 1% Lidocaine was used for local anesthesia. Under ultrasound guidance a 6 Fr Safe-T-Centesis catheter was introduced. Thoracentesis was performed. The catheter was removed and a dressing applied. FINDINGS: A total of approximately 650 mL of hazy gold fluid was removed. Samples were sent to the laboratory as requested by the clinical team. IMPRESSION: Successful ultrasound guided left thoracentesis yielding 650 mL of pleural fluid. Read by: Earley Abide, PA-C Electronically Signed   By: Jerilynn Mages.  Shick M.D.   On: 05/31/2019 14:27   Ir Thoracentesis Asp Pleural Space W/img Guide  Result Date: 05/30/2019 INDICATION: Patient with history of acute on chronic diastolic HF, acute hypoxic respiratory distress, and bilateral pleural effusions (R>L). Request is made for diagnostic and therapeutic right thoracentesis. EXAM: ULTRASOUND GUIDED DIAGNOSTIC AND THERAPEUTIC RIGHT THORACENTESIS MEDICATIONS: 15 mL 1% lidocaine COMPLICATIONS: None immediate. PROCEDURE: An ultrasound guided thoracentesis was thoroughly discussed with the patient and questions  answered. The benefits, risks, alternatives and complications were also discussed. The patient understands and wishes to proceed with the procedure. Written consent was obtained. Ultrasound was performed to localize and mark an adequate pocket of fluid in the right chest. The area was then prepped and draped in the normal sterile fashion. 1% Lidocaine was used for local anesthesia. Under ultrasound guidance a  6 Fr Safe-T-Centesis catheter was introduced. Thoracentesis was performed. The catheter was removed and a dressing applied. FINDINGS: A total of approximately 1.2 L of hazy gold fluid was removed. Samples were sent to the laboratory as requested by the clinical team. IMPRESSION: Successful ultrasound guided right thoracentesis yielding 1.2 L of pleural fluid. Read by: Earley Abide, PA-C Electronically Signed   By: Jerilynn Mages.  Shick M.D.   On: 05/30/2019 09:56    EKG     EKG: EKG was personally reviewed and demonstrates: Atrial fibrillation with ventricular rate of 105 bpm and PVCs.   Telemetry: Telemetry was personally reviewed and demonstrates: Atrial fibrillation with ventricular rates in the 100's to 130's. 3 beats of non-sustained VT also noted.   Cardiac Imaging    Echocardiogram 05/27/2019: 1. The left ventricle has hyperdynamic systolic function, with an ejection fraction of >65%. The cavity size was decreased. Left ventricular diastolic Doppler parameters are indeterminate.  2. Left atrial size was not well visualized.  3. LA appears to be at least moderate to severely enlarged Not well seen.  4. Right atrial size was mildly dilated.  5. The mitral valve is abnormal. Mild thickening of the mitral valve leaflet. There is moderate mitral annular calcification present.  6. The aortic valve is tricuspid. Moderate thickening of the aortic valve. Mild calcification of the aortic valve.  7. The left ventricular function has unchanged.  8. The interatrial septum was not assessed.  Assessment & Plan     Atrial Fibrillation/Flutter with RVR - Patient admitted with acute pancreatitis after presenting with severe abdominal pain and vomiting. Hospital course complicated by encephalopathy, AKI with creatinine peaking at 3.81, acute respiratory failure requiring BiPAP. Cardiology consulted today for evaluation of atrial fibrillation with RVR. - Telemetry shows atrial fibrillation with ventricular rates in the 100's to 130's. Suspect rapid rates being driven by acute illness. - Magnesium 2.5 today. - Potassium 3.6.  - Currently on IV Amiodarone and IV Cardizem. Continue for now with plans to transition make to home meds when patient able to take PO. - Patient on Coumadin at home. She was initially started on IV Heparin here but this was stopped due to GI bleed. Restart when OK with GI.  Acute Hypoxic Respiratory Failure with Bilateral Pleura Effusion - Acute respiratory failure initially felt to be due to acute diastolic CHF due to IV fluids. Echo from 05/27/2019 showed LVEF of >65%. - Patient s/p right thoracentesis on 05/30/2019 yielding 1.2 L of pleural fluid on 05/30/2019 and a left thoracentesis on 05/31/2019 yielding 650 mL of pleural fluid. - Currently on high flow O2. - Management per PCCM.  GI Bleed - Patient noted to have black loose stools. Hemoccult positive. GI consulted. EGD initially planned for yesterday but this was canceled due to respiratory status. - Hemoglobin 9.6 today. - GI following.   Acute Pancreatitis - WBC 27.0 today. - Management per primary team.  AKI - Serum creatinine peaked at 3.81 on 05/29/2019. Baseline around 1.0 to 1.2.  - Creatinine improving and 1.9 today.  - Continue to avoid nephrotoxic agents. - Daily BMET  Otherwise, per primary team.  Signed, Darreld Mclean, PA-C 06/01/2019, 4:36 PM Pager: [phone] For questions or updates, please contact   Please consult [email or web address] for contact info under Cardiology/STEMI.

## 2019-06-01 NOTE — Plan of Care (Signed)
  Problem: Health Behavior/Discharge Planning: Goal: Ability to manage health-related needs will improve Outcome: Not Progressing   Problem: Clinical Measurements: Goal: Respiratory complications will improve Outcome: Not Progressing   Problem: Activity: Goal: Risk for activity intolerance will decrease Outcome: Not Progressing   Problem: Nutrition: Goal: Adequate nutrition will be maintained Outcome: Not Progressing

## 2019-06-01 NOTE — Progress Notes (Signed)
  Speech Language Pathology Treatment: Dysphagia  Patient Details Name: Melanie [name] MRN: [id number] DOB: 1941-08-12 Today's Date: 06/01/2019 Time: 1035-1100 SLP Time Calculation (min) (ACUTE ONLY): 25 min  Assessment / Plan / Recommendation Clinical Impression  Pt continues with increasing 02 requirements; today maintaining sats on HFNC at 40 liters.  Pt is more alert and cognizant of her condition than when last seen by SLP services.  She has been NPO; plan for cortrak deferred (interferes with BiPAP, which she has been using at night).  Pt was offered trials of applesauce and thin liquids, which were tolerated with no overt s/s of aspiration.  We discussed in basic terms the importance of pt eating/drinking slowly, taking frequent rest breaks, allowing time to "catch her breath" and putting food/drink aside when she begins coughing during intake. She verbalizes and demonstrates understanding.  Risks of aspiration are present with HFNC, but burdens of continuing NPO status are greater.  Now that her MS has improved, pt can minimize risk and monitor her performance.  D/W Dr. Tamala Julian, critical care. Advance POs to full liquids at this time; give meds whole in puree. SLP will follow.   HPI HPI: 78 y.o. female with medical history significant of DM2, HTN.  Patient presented to the ED with c/o epigastric abd pain, N/V.  Dx sepsis of unknown origin, acute pancreatitis, bilateral pleural effusions, acute respiratory distress.  S/p R sided thoracentesis 6/8 and s/p L sided thoracentesis 6/9. On 6/9 s/p procedure, patient exhibited increasing oxygen requirement, requiring NRB and then BiPAP.        SLP Plan  Continue with current plan of care       Recommendations  Diet recommendations: Other(comment)(full liquids) Liquids provided via: Cup;Straw Medication Administration: Whole meds with puree Supervision: Patient able to self feed;Intermittent supervision to cue for compensatory  strategies Compensations: Slow rate;Small sips/bites Postural Changes and/or Swallow Maneuvers: Seated upright 90 degrees                Oral Care Recommendations: Oral care BID Follow up Recommendations: Other (comment)(tbd) SLP Visit Diagnosis: Dysphagia, unspecified (R13.10) Plan: Continue with current plan of care       GO                Juan Quam Laurice 06/01/2019, 11:11 AM  Estill Bamberg L. Tivis Ringer, [place] Office number [phone]

## 2019-06-01 NOTE — Progress Notes (Signed)
Pharmacy Antibiotic Note  Melanie [name] is a 78 y.o. female admitted on 05/22/2019 with acute pancreatitis and possible intra-abdominal infection. RUQ [place] and MRCP without evidence of acute chole or stone in CBD. Pt has had prolonged course of antibiotics currently on day #4 of meropenem. Repeat cultures negative thus far, SCr improving, WBC trending down with steroids D/Cd.   Plan: -Continue Meropenem 500 mg IV q12h -Monitor renal function, clinical progress, and length of therapy   Height: 5\' 4"  (162.6 cm) Weight: 207 lb 14.3 oz (94.3 kg) IBW/kg (Calculated) : 54.7  Temp (24hrs), Avg:97.2 F (36.2 C), Min:97 F (36.1 C), Max:97.3 F (36.3 C)  Recent Labs  Lab 05/25/19 1428  05/29/19 1035  05/29/19 2315 05/30/19 0500 05/30/19 1130 05/30/19 1700 05/31/19 0319 05/31/19 1028 06/01/19 0454 06/01/19 0641  WBC  --    < > 31.0*   < > 26.9* 24.7* 27.8* 36.7* 29.4*  --   --  27.0*  CREATININE  --    < > 3.75*   < > 3.63* 3.56* 3.26* 2.96*  --  2.39*  --   --   LATICACIDVEN 2.0*  --  1.8  --   --   --   --   --   --   --  2.0*  --    < > = values in this interval not displayed.    Estimated Creatinine Clearance: 21.9 mL/min (A) (by C-G formula based on SCr of 2.39 mg/dL (H)).    Allergies  Allergen Reactions  . Niacin And Related Rash  . Keflex [Cephalexin] Other (See Comments)    Fatigue, sore throat, body aches  . Sausage [Pickled Meat] Other (See Comments)    Drowsiness and dizziness  . Tape Rash    Bandaids    Antimicrobials this admission:  Zosyn 6/1 >> 6/5 Unasyn 6/5 >>6/7 Meropenem 6/7 >>  Dose adjustments this admission:  6/6: Unasyn 3 g IV q8h >> q12h  Microbiology results:  6/7 BCx: NGTD 6/8 R pleural: NGTD  Thank you for allowing pharmacy to be a part of this patient's care.  Arrie Senate, PharmD, BCPS Clinical Pharmacist [phone] Please check AMION for all [place] numbers 06/01/2019

## 2019-06-01 NOTE — Progress Notes (Signed)
Lactic Acid is 2 Text page K. Schorr N.P. results.

## 2019-06-01 NOTE — Progress Notes (Signed)
Pt on HFNC during the day and BIPAP PRN at night as required per pt need. RT will place pt on BIPAP if needed tonight. RT will continue to monitor.

## 2019-06-01 NOTE — Progress Notes (Signed)
[facility] Progress Note  Patient: Melanie [name] OZD:[id number]   PCP: [name], MD DOB: [birth year]   DOA: 05/22/2019   DOS: 06/01/2019   Date of Service: the patient was seen and examined on 06/01/2019  Brief hospital course: Pt. with PMH of type II DM, HTN, mild dementia per family; admitted on 05/22/2019, presented with complaint of epigastric abdominal pain nausea and vomiting, was found to have acute pancreatitis as well as sepsis of unknown origin. Currently further plan is continue current treatment.  Subjective: Continues to have shortness of breath but no nausea no vomiting.  No chest pain abdominal pain.  Mouth is dry.  Wants to eat or drink.  Still has black in her bowel movement.  Tolerating high flow nasal cannula in the morning.  Assessment and Plan: 1. Sepsis POA, unknown etiology likely intra-abdominal source Anion gap metabolic acidosis Possible community-acquired versus aspiration pneumonia Patient received IV fluids appropriately.  Then became volume overloaded. Initially treated with IV vancomycin and Zosyn.  Transition to IV Unasyn. Chest x-ray on 05/28/2019 shows moderate bilateral pleural effusion with consolidation ABG shows chronic compensated respiratory alkalosis along with metabolic acidosis and high anion gap Unfortunately no blood cultures were performed on the time of admission.   Worsening leukocytosis as well as respiratory status, patient not responding to IV Unasyn and IV Zosyn. Blood cultures repeated, currently no growth. On meropenem day 4.  We will continue it for 3 more days.  2.  Acute gallstone pancreatitis. Initial work-up at the time of admission with ultrasound abdomen was negative for any CBD dilatation or CBD stone. MRCP was performed which was also negative for any CBD dilatation or CBD stone. Eventually patient will require cholecystectomy but currently not stable to consider that for now in the presence of ongoing sepsis.  3.  Acute kidney injury. multifactorial.   Initially patient was hypovolemic in the setting of pancreatitis.  Later on patient was hyperkalemic in the setting of resuscitation efforts. Blood pressure changes with accelerated hypertension at the time of admission as well as hypotension intermittently could also contribute to AKI. Overdiuresis could also be the etiology. Now the BUN is also elevated, concern is for GI bleed. Responding to gentle IV hydration as well as crystalloid infusion with albumin. Continue to observe  4.  Acute hypoxic respiratory distress. Acute on chronic diastolic CHF in the setting of hypervolemia from IV resuscitation Bilateral pleural effusion Community-acquired versus aspiration pneumonia Atelectasis versus mucous plugging. Multifactorial. Bilateral pleural effusions On chest x-ray S/P thoracentesis bilaterally.  Work-up only shows transudative effusion. Cultures so far negative.  Echocardiogram showed preserved EF without any significant wall motion abnormality or valvular abnormality. Bubble study ordered by CCM.  ABG showed significant hypoxia without any evidence of hypercarbia. BiPAP PRN but for now holding secondary to hypotension.  And the patient is able to tolerate high flow nasal cannula for now. Already on IV antibiotics.  Added IV steroids on 05/28/2019.  Will taper. Continue Xopenex scheduled nebulization as well as Xopenex as needed nebulization. Received multiple rounds of IV as needed Lasix. Continue vest and chest PT. Hypertonic saline nebulization as well.  5.  Possible GI bleed. Acute blood loss anemia.  Likely dilutional plus hemorrhagic Supratherapeutic INR. Constipation. Presents with hemoglobin of 16.  Aggressively resuscitated.  Hemoglobin around 14 on 05/28/2019. The initial drop is secondary to dilution.  Now hemoglobin stable Concern is for possible GI bleed with melena. With normal platelets hemolysis is less likely.  CT  abdomen  pelvis negative for any active bleeding.   Appreciate GI input.  We will keep the patient n.p.o.  IV Protonix every 12 hours. Supratherapeutic INR reversed with vitamin K. Currently INR subtherapeutic and H&H stable. No plan for endoscopic evaluation.  6.  Chronic atrial fibrillation. With RVR. Heart rate currently in 120s. On oral Cardizem and metoprolol at home. Patient was initially on Cardizem drip. IV Lopressor was added this as needed which was changed to scheduled Lopressor. Due to persistent hypertension clonidine 0.1 mg clonidine patch was also added. After the patient was placed on BiPAP her blood pressure dropped. Patient was transitioned to IV amiodarone drip for now. Due to concern for GI bleed patient's INR was reversed with vitamin K. Currently the patient is on amiodarone drip with persistent tachycardia.  Cardizem drip was added. Cardiology consult for further assistance with management of A. fib with RVR. Echocardiogram again showed preserved EF without any significant wall motion abnormalities.  7.  Acute metabolic encephalopathy.?  Baseline dementia per family In the setting of sepsis. CT head unremarkable. EEG did not show any evidence of acute seizures and showed metabolic encephalopathy. Metabolic work-up including TSH free T4, ammonia, B12 level normal. Mentation improved significantly from the day of admission. UDS is also only showing opiates which the patient is actually receiving in the hospital. Discussed with son patient does have some baseline memory deficit and may be having some mild delirium in the setting of sepsis. Continue to monitor.  8.  Type 2 diabetes mellitusPoorly controlled with hyperglycemia Hemoglobin A1c 7.5 in February 24, 2019. Currently hyperglycemia secondary to steroids. D5 added for hypernatremia.  9.  Dysphagia. Poor p.o. intake. Currently speech therapy has been consulted. Started on full liquid diet.  10.  Obesity.  Body mass index is 35.68 kg/m.  Continue to monitor for now.  11.  Hypokalemia. Replaced  12.  Hypernatremia. In the setting of poor p.o. intake as well as dehydration Corrected sodium as high as 160. Currently trending downwards. D5 added by CCM. Monitor.  Diet: Clear liquid diet DVT Prophylaxis: SCD, pharmacological prophylaxis contraindicated due to Supratherapeutic INR and possibility of GI bleed  Advance goals of care discussion: Full code  Family Communication: no family was present at bedside, at the time of interview.  Discussed with son on 05/31/2019.   Disposition:  Discharge to to be determined.    Consultants: gastroenterology, IR, cardiology, pulmonary Procedures:  Right thoracentesis Left thoracentesis Echocardiogram  Scheduled Meds: . feeding supplement (ENSURE ENLIVE)  237 mL Oral BID BM  . insulin aspart  0-20 Units Subcutaneous Q4H  . ipratropium-albuterol  3 mL Nebulization Q6H  . lidocaine      . pantoprazole (PROTONIX) IV  40 mg Intravenous Q12H  . sodium chloride flush  10-40 mL Intracatheter Q12H   Continuous Infusions: . amiodarone 30 mg/hr (06/01/19 1822)  . diltiazem (CARDIZEM) infusion 12.5 mg/hr (06/01/19 1810)  . meropenem (MERREM) IV 500 mg (06/01/19 1022)   PRN Meds: acetaminophen **OR** acetaminophen, HYDROmorphone (DILAUDID) injection, lidocaine (PF), LORazepam, metoprolol tartrate, ondansetron **OR** ondansetron (ZOFRAN) IV, sodium chloride flush, sodium chloride HYPERTONIC Antibiotics: Anti-infectives (From admission, onward)   Start     Dose/Rate Route Frequency Ordered Stop   05/29/19 1000  meropenem (MERREM) 500 mg in sodium chloride 0.9 % 100 mL IVPB     500 mg 200 mL/hr over 30 Minutes Intravenous Every 12 hours 05/29/19 0805     05/28/19 1700  Ampicillin-Sulbactam (UNASYN) 3 g in sodium chloride 0.9 % 100  mL IVPB  Status:  Discontinued     3 g 200 mL/hr over 30 Minutes Intravenous Every 12 hours 05/28/19 0735 05/29/19 0805    05/27/19 1200  Ampicillin-Sulbactam (UNASYN) 3 g in sodium chloride 0.9 % 100 mL IVPB  Status:  Discontinued     3 g 200 mL/hr over 30 Minutes Intravenous Every 8 hours 05/27/19 1044 05/28/19 0735   05/23/19 1600  piperacillin-tazobactam (ZOSYN) IVPB 3.375 g  Status:  Discontinued     3.375 g 12.5 mL/hr over 240 Minutes Intravenous Every 8 hours 05/23/19 0933 05/27/19 1044   05/23/19 0800  piperacillin-tazobactam (ZOSYN) IVPB 3.375 g     3.375 g 100 mL/hr over 30 Minutes Intravenous  Once 05/23/19 0141 05/23/19 0911   05/23/19 0100  piperacillin-tazobactam (ZOSYN) IVPB 3.375 g     3.375 g 100 mL/hr over 30 Minutes Intravenous  Once 05/23/19 0059 05/23/19 0241       Objective: Physical Exam: Vitals:   06/01/19 1313 06/01/19 1326 06/01/19 1400 06/01/19 1430  BP: (!) 155/90 (!) 155/90 131/78   Pulse:  (!) 127 83   Resp: (!) 30 (!) 21 20 (!) 25  Temp:      TempSrc:      SpO2: 94% 95% 96%   Weight:      Height:        Intake/Output Summary (Last 24 hours) at 06/01/2019 1926 Last data filed at 06/01/2019 1806 Gross per 24 hour  Intake 293.6 ml  Output 1250 ml  Net -956.4 ml   Filed Weights   05/22/19 2216 05/23/19 0355 05/31/19 0438  Weight: 99.8 kg 92.9 kg 94.3 kg   General: alert and oriented to time, place, and person. Appear in severe distress, affect appropriate Eyes: PERRL, Conjunctiva normal ENT: Oral Mucosa Clear, dry  Neck: difficult to assess  JVD, no Abnormal Mass Or lumps Cardiovascular: S1 and S2 Present, no Murmur, peripheral pulses symmetrical Respiratory: Increased respiratory effort, Bilateral Air entry equal and Decreased, positive use of accessory muscle, bilateral basal crackles, no wheezes Abdomen: Bowel Sound absent, Soft and epigastric tenderness, no hernia Skin: no rashes  Extremities: no Pedal edema, no calf tenderness Neurologic: normal without focal findings, mental status, speech normal, alert and oriented x3, PERLA, Motor strength 5/5 and  symmetric and sensation grossly normal to light touch Gait not checked due to patient safety concerns  Data Reviewed: CBC: Recent Labs  Lab 05/27/19 0911  05/29/19 1035  05/30/19 0500 05/30/19 1130 05/30/19 1700 05/31/19 0319 06/01/19 0641  WBC 17.5*   < > 31.0*   < > 24.7* 27.8* 36.7* 29.4* 27.0*  NEUTROABS 15.4*  --  27.2*  --   --   --   --   --  24.9*  HGB 15.1*   < > 11.1*   < > 8.6* 8.9* 9.4* 8.8* 9.6*  HCT 44.6   < > 33.9*   < > 26.0* 26.4* 28.1* 26.7* 29.8*  MCV 89.2   < > 91.4   < > 91.5 90.1 90.9 91.8 92.8  PLT 236   < > 303   < > 233 238 268 229 251   < > = values in this interval not displayed.   Basic Metabolic Panel: Recent Labs  Lab 05/27/19 0911  05/30/19 1130 05/30/19 1700 05/31/19 1028 06/01/19 0641 06/01/19 1635  NA 148*   < > 151* 152* 155* 156* 155*  K 2.9*   < > 2.6* 3.1* 3.7 3.6 3.7  CL 112*   < >  116* 119* 123* 123* 126*  CO2 21*   < > 18* 20* 21* 22 20*  GLUCOSE 200*   < > 321* 269* 227* 192* 240*  BUN 27*   < > 117* 108* 92* 73* 64*  CREATININE 1.17*   < > 3.26* 2.96* 2.39* 1.97* 1.90*  CALCIUM 8.0*   < > 8.1* 8.0* 8.6* 8.5* 8.1*  MG 1.8  --   --   --  2.6* 2.5*  --    < > = values in this interval not displayed.    Liver Function Tests: Recent Labs  Lab 05/26/19 0814 05/27/19 0911 05/29/19 1035 05/31/19 1028 06/01/19 0641  AST 22 17 29 22 19   ALT 24 23 35 22 22  ALKPHOS 63 72 79 68 59  BILITOT 1.1 1.3* 0.9 0.6 0.7  PROT 5.6* 6.0* 5.4* 5.7* 5.4*  ALBUMIN 2.4* 2.5* 2.1* 2.8* 2.7*   Recent Labs  Lab 05/29/19 1035  LIPASE 32   No results for input(s): AMMONIA in the last 168 hours. Coagulation Profile: Recent Labs  Lab 05/29/19 0533 05/29/19 1617 05/30/19 0500 05/31/19 0319 06/01/19 0641  INR 8.2* 2.0* 1.4* 1.4* 1.4*   Cardiac Enzymes: No results for input(s): CKTOTAL, CKMB, CKMBINDEX, TROPONINI in the last 168 hours. BNP (last 3 results) No results for input(s): PROBNP in the last 8760 hours. CBG: Recent Labs  Lab  06/01/19 0005 06/01/19 0415 06/01/19 0806 06/01/19 1151 06/01/19 1633  GLUCAP 110* 166* 157* 159* 217*   Studies: Dg Chest Port 1 View  Result Date: 06/01/2019 CLINICAL DATA:  Shortness of breath EXAM: PORTABLE CHEST 1 VIEW COMPARISON:  05/31/2019 FINDINGS: Cardiac shadow is stable. Aortic calcifications are again seen. Lungs are well aerated bilaterally. Small pleural effusions are again noted. Patchy infiltrate is noted within the right lung. IMPRESSION: Stable appearance when compare with previous day. Electronically Signed   By: Inez Catalina M.D.   On: 06/01/2019 09:14   Vas [place] Lower Extremity Venous (dvt)  Result Date: 06/01/2019  Lower Venous Study Indications: Swelling, and SOB.  Limitations: Body habitus and patient required to sit in upright position secondary to shortness of breath and hypoxia. Comparison Study: No prior study. Performing Technologist: Maudry Mayhew MHA, RDMS, RVT, RDCS  Examination Guidelines: A complete evaluation includes B-mode imaging, spectral Doppler, color Doppler, and power Doppler as needed of all accessible portions of each vessel. Bilateral testing is considered an integral part of a complete examination. Limited examinations for reoccurring indications may be performed as noted.  +---------+---------------+---------+-----------+----------+------------------+ RIGHT    CompressibilityPhasicitySpontaneityPropertiesSummary            +---------+---------------+---------+-----------+----------+------------------+ CFV                                                   Not visualized     +---------+---------------+---------+-----------+----------+------------------+ SFJ                                                   Not visualized     +---------+---------------+---------+-----------+----------+------------------+ FV Prox                          Yes  patent by color                                                           Doppler            +---------+---------------+---------+-----------+----------+------------------+ FV Mid                           Yes                  patent by color                                                          Doppler            +---------+---------------+---------+-----------+----------+------------------+ FV Distal                        Yes                  patent by color                                                          Doppler            +---------+---------------+---------+-----------+----------+------------------+ PFV                                                   Not visualized     +---------+---------------+---------+-----------+----------+------------------+ POP      Full           Yes      Yes                                     +---------+---------------+---------+-----------+----------+------------------+ PTV      Full                    Yes                                     +---------+---------------+---------+-----------+----------+------------------+ PERO     Full                    Yes                                     +---------+---------------+---------+-----------+----------+------------------+ Unable to perform all compression maneuvers secondary to patient position.  +---------+---------------+---------+-----------+----------+------------------+ LEFT     CompressibilityPhasicitySpontaneityPropertiesSummary            +---------+---------------+---------+-----------+----------+------------------+ CFV  Not visualized     +---------+---------------+---------+-----------+----------+------------------+ SFJ                                                   Not visualized     +---------+---------------+---------+-----------+----------+------------------+ FV Prox                          Yes                  patent by color                                                           Doppler            +---------+---------------+---------+-----------+----------+------------------+ FV Mid                           Yes                  patent by color                                                          Doppler            +---------+---------------+---------+-----------+----------+------------------+ FV Distal                        Yes                  patent by color                                                          Doppler            +---------+---------------+---------+-----------+----------+------------------+ PFV                                                   Not visualized     +---------+---------------+---------+-----------+----------+------------------+ POP      Full           Yes      Yes                                     +---------+---------------+---------+-----------+----------+------------------+ PTV      Full                    Yes                                     +---------+---------------+---------+-----------+----------+------------------+  PERO     Full                    Yes                                     +---------+---------------+---------+-----------+----------+------------------+ Unable to perform all compression maneuvers secondary to patient position.    Summary: Right: There is no evidence of deep vein thrombosis in the lower extremity. However, portions of this examination were limited- see technologist comments above. No cystic structure found in the popliteal fossa. Left: There is no evidence of deep vein thrombosis in the lower extremity. However, portions of this examination were limited- see technologist comments above. No cystic structure found in the popliteal fossa.  *See table(s) above for measurements and observations. Electronically signed by Curt Jews MD on 06/01/2019 at 4:57:22 PM.    Final      Time spent: 35 minutes   Author: Berle Mull, MD [facility] 06/01/2019 7:26 PM  To reach On-call, see care teams to locate the attending and reach out to them via [email or web address]. If 7PM-7AM, please contact night-coverage If you still have difficulty reaching the attending provider, please page the [facility] (Director on Call) for [facility] on amion for assistance.

## 2019-06-01 NOTE — Progress Notes (Addendum)
NAME:  Melanie [name], MRN:  [id number], DOB:  04-10-1941, LOS: 9 ADMISSION DATE:  05/22/2019, CONSULTATION DATE:  05/31/19 REFERRING MD:  Posey Pronto - [facility], CHIEF COMPLAINT:  Respiratory Failure  Brief History   78 yo F with worsening hypoxia and increasing O2 requirement this afternoon. Admitted with acute pancreatitis, developed bilat pleural effusions (s/p R thora 6/8 and L thora 6/9)  History of present illness   78 yo F PMH HTN DM2 HLD Arthritis who presents to ED 6/1 with abdominal and epigastric pain. Symptoms began 3 hours prior to presentation. Associated symptoms include n/v. Initial labs and CT a/p in ED consistent with acute pancreatitis. Hospital course complicated by bilateral pleural effusions, s/p R sided thoracentesis 6/8 and s/p L sided thoracentesis 6/9. On 6/9 s/p procedure, patient exhibited increasing oxygen requirement, requiring NRB and then BiPAP.   PCCM asked to consult on patient for acute respiratory failure with hypoxia on BiPAP.   Past Medical History  HTN DM2 HLD Arthritis   Significant Hospital Events   6/1 Admitted  6/8 R sided thora 6/9 L sided thora  Consults:  PCCM   Procedures:  6/8 R sided thoracentesis 6/9 L sided thoracentesis   Significant Diagnostic Tests:  CXR 6/9> s/p L sided thoracentesis compared to previous. Some small patchy infiltrate at R base. Small pleural effusion at L Lung base. Atelectasis   CXR 6/10> Stable from prior  Micro Data:  6/1 SARS Cov-2> negative  6/7 SARS Cov 2> negative 6/7 BCx> NGTD  6/8 R sided pleural fluid> no organism seen.  6/9 L sided pleural fluid No organism seen   Antimicrobials:  Meropenem for IAI 6/7>>   Interim history/subjective:  Off BiPAP this morning and on humidified High Flow, SpO2 98% at time of exam. Nursing concerns regarding abrupt desaturation events when patient is not on BiPAP or HighFlow  Per nursing, patient is planned for Cortrak placement today  Patient states feeling  "fair, pretty good" when asked how she is feeling. She states she wants to "get in the comfy chair."   Objective   Blood pressure (!) 149/76, pulse (!) 118, temperature (!) 97 F (36.1 C), temperature source Axillary, resp. rate (!) 22, height 5\' 4"  (1.626 m), weight 94.3 kg, SpO2 96 %.    FiO2 (%):  [50 %-100 %] 100 %   Intake/Output Summary (Last 24 hours) at 06/01/2019 0916 Last data filed at 06/01/2019 0630 Gross per 24 hour  Intake 293.6 ml  Output 0 ml  Net 293.6 ml   Filed Weights   05/22/19 2216 05/23/19 0355 05/31/19 0438  Weight: 99.8 kg 92.9 kg 94.3 kg    Examination: General: WDWN older adult female, in bed on HighFlow, eating ice chips in NAD  HENT: NCAT. Pink mmm. Patent nares. Trachea midline. Anicteric sclera.  Lungs:  Shallow respirations. Symmetrical chest wall expansion. No accessory muscle recruitment. Course breath sounds throughout.  Cardiovascular: IRIR s1s2 no rgm. 1+ peripheral pulses  Abdomen: Soft, round, non-distended. + bowel sounds.  Extremities: Symmetrical bulk and tone. No cyanosis. No clubbing.  Neuro: AAAOx3 following commands. PERRL.  Skin: Pale, clean, dry, warm, intact. Ecchymosis R hand.  GU: Clear straw urine   Resolved Hospital Problem list    Assessment & Plan:   Acute Respiratory Failure with hypoxia -bilateral pleural effusions s/p thoracentesis on 6/8 and 6/9. Etiology possibly third spacing related to acute pancreatitis  - bedside US 6/9 does not show significant recurrence of pleural effusions  - CXR 6/10 shows  small bilateral pleural effusions  - Patient continues to desaturate quickly when not on BiPAP or High Flow. No change in mentation associated with desaturation. Patient recovers after therapy resumed. P - Lower extremity venous dopplers ordered - ECHO bubble study ordered - Continue humidified high flow vs PRN BiPAP for SpO2 goal > 80% - CPT q4hrs - IS, flutter valve q1 hour - Duonebs, hypertonic saline nebs - OOB to  chair,  - AM CXR - Patient does not exhibit true ICU needs at this time. We do however appreciate that that her nursing needs are greater than most progressive patient and have discussed with the primary team.  Rest Per Primary    Thank you for consulting PCCM. We have discussed the patient with the primary team and will sign off. Please re-engage if needed.    Best practice:  Diet: NPO Pain/Anxiety/Delirium protocol (if indicated): APAP, PRN dilaudid  VAP protocol (if indicated): n/a DVT prophylaxis: SCDs GI prophylaxis: protonix  Glucose control: SSI  Mobility: Bed to chair  Code Status: Full  Family Communication: Per Primary Disposition: Progressive   Labs   CBC: Recent Labs  Lab 05/27/19 0911  05/29/19 1035  05/30/19 0500 05/30/19 1130 05/30/19 1700 05/31/19 0319 06/01/19 0641  WBC 17.5*   < > 31.0*   < > 24.7* 27.8* 36.7* 29.4* 27.0*  NEUTROABS 15.4*  --  27.2*  --   --   --   --   --  24.9*  HGB 15.1*   < > 11.1*   < > 8.6* 8.9* 9.4* 8.8* 9.6*  HCT 44.6   < > 33.9*   < > 26.0* 26.4* 28.1* 26.7* 29.8*  MCV 89.2   < > 91.4   < > 91.5 90.1 90.9 91.8 92.8  PLT 236   < > 303   < > 233 238 268 229 251   < > = values in this interval not displayed.    Basic Metabolic Panel: Recent Labs  Lab 05/27/19 0911  05/30/19 0500 05/30/19 1130 05/30/19 1700 05/31/19 1028 06/01/19 0641  NA 148*   < > 150* 151* 152* 155* 156*  K 2.9*   < > 3.0* 2.6* 3.1* 3.7 3.6  CL 112*   < > 118* 116* 119* 123* 123*  CO2 21*   < > 20* 18* 20* 21* 22  GLUCOSE 200*   < > 313* 321* 269* 227* 192*  BUN 27*   < > 124* 117* 108* 92* 73*  CREATININE 1.17*   < > 3.56* 3.26* 2.96* 2.39* 1.97*  CALCIUM 8.0*   < > 7.9* 8.1* 8.0* 8.6* 8.5*  MG 1.8  --   --   --   --  2.6* 2.5*   < > = values in this interval not displayed.   GFR: Estimated Creatinine Clearance: 26.6 mL/min (A) (by C-G formula based on SCr of 1.97 mg/dL (H)). Recent Labs  Lab 05/25/19 1428  05/29/19 1035  05/30/19 1130  05/30/19 1700 05/31/19 0319 06/01/19 0454 06/01/19 0641  WBC  --    < > 31.0*   < > 27.8* 36.7* 29.4*  --  27.0*  LATICACIDVEN 2.0*  --  1.8  --   --   --   --  2.0*  --    < > = values in this interval not displayed.    Liver Function Tests: Recent Labs  Lab 05/26/19 0814 05/27/19 0911 05/29/19 1035 05/31/19 1028 06/01/19 0641  AST 22 17 29  22  19  ALT 24 23 35 22 22  ALKPHOS 63 72 79 68 59  BILITOT 1.1 1.3* 0.9 0.6 0.7  PROT 5.6* 6.0* 5.4* 5.7* 5.4*  ALBUMIN 2.4* 2.5* 2.1* 2.8* 2.7*   Recent Labs  Lab 05/25/19 1428 05/29/19 1035  LIPASE 91* 32   Recent Labs  Lab 05/25/19 1428  AMMONIA 27    ABG    Component Value Date/Time   PHART 7.469 (H) 05/31/2019 1255   PCO2ART 29.4 (L) 05/31/2019 1255   PO2ART 59.3 (L) 05/31/2019 1255   HCO3 21.3 05/31/2019 1255   TCO2 25 05/22/2019 2338   ACIDBASEDEF 2.1 (H) 05/31/2019 1255   O2SAT 91.6 05/31/2019 1255     Coagulation Profile: Recent Labs  Lab 05/29/19 0533 05/29/19 1617 05/30/19 0500 05/31/19 0319 06/01/19 0641  INR 8.2* 2.0* 1.4* 1.4* 1.4*    Cardiac Enzymes: No results for input(s): CKTOTAL, CKMB, CKMBINDEX, TROPONINI in the last 168 hours.  HbA1C: Hgb A1c MFr Bld  Date/Time Value Ref Range Status  05/29/2019 10:35 AM 7.4 (H) 4.8 - 5.6 % Final    Comment:    (NOTE) Pre diabetes:          5.7%-6.4% Diabetes:              >6.4% Glycemic control for   <7.0% adults with diabetes   02/24/2019 11:13 AM 7.5 (H) 4.6 - 6.5 % Final    Comment:    Glycemic Control Guidelines for People with Diabetes:Non Diabetic:  <6%Goal of Therapy: <7%Additional Action Suggested:  >8%     CBG: Recent Labs  Lab 05/31/19 1603 05/31/19 1959 06/01/19 0005 06/01/19 0415 06/01/19 0806  GLUCAP 190* 175* 110* [place] MSN, AGACNP-BC [place] [phone] If no answer, [phone] 06/01/2019, 9:21 AM

## 2019-06-01 NOTE — Progress Notes (Signed)
Pt taken off Bipap at this time. Placed on HFNC 40L/100%.  Pt tolerating well, Spo2 96%.  Will try to wean fio2 down as tolerated. RT will monitor

## 2019-06-02 ENCOUNTER — Inpatient Hospital Stay (HOSPITAL_COMMUNITY): Payer: Medicare HMO

## 2019-06-02 DIAGNOSIS — N179 Acute kidney failure, unspecified: Secondary | ICD-10-CM

## 2019-06-02 DIAGNOSIS — A419 Sepsis, unspecified organism: Principal | ICD-10-CM

## 2019-06-02 DIAGNOSIS — K8001 Calculus of gallbladder with acute cholecystitis with obstruction: Secondary | ICD-10-CM

## 2019-06-02 DIAGNOSIS — K8500 Idiopathic acute pancreatitis without necrosis or infection: Secondary | ICD-10-CM

## 2019-06-02 DIAGNOSIS — I959 Hypotension, unspecified: Secondary | ICD-10-CM

## 2019-06-02 DIAGNOSIS — N183 Chronic kidney disease, stage 3 (moderate): Secondary | ICD-10-CM

## 2019-06-02 DIAGNOSIS — J90 Pleural effusion, not elsewhere classified: Secondary | ICD-10-CM

## 2019-06-02 DIAGNOSIS — J9601 Acute respiratory failure with hypoxia: Secondary | ICD-10-CM

## 2019-06-02 LAB — BASIC METABOLIC PANEL
Anion gap: 10 (ref 5–15)
Anion gap: 8 (ref 5–15)
Anion gap: 7 (ref 5–15)
BUN: 56 mg/dL — ABNORMAL HIGH (ref 8–23)
BUN: 49 mg/dL — ABNORMAL HIGH (ref 8–23)
BUN: 48 mg/dL — ABNORMAL HIGH (ref 8–23)
CO2: 21 mmol/L — ABNORMAL LOW (ref 22–32)
CO2: 25 mmol/L (ref 22–32)
CO2: 23 mmol/L (ref 22–32)
Calcium: 8.4 mg/dL — ABNORMAL LOW (ref 8.9–10.3)
Calcium: 8.5 mg/dL — ABNORMAL LOW (ref 8.9–10.3)
Calcium: 8.3 mg/dL — ABNORMAL LOW (ref 8.9–10.3)
Chloride: 126 mmol/L — ABNORMAL HIGH (ref 98–111)
Chloride: 124 mmol/L — ABNORMAL HIGH (ref 98–111)
Chloride: 126 mmol/L — ABNORMAL HIGH (ref 98–111)
Creatinine, Ser: 1.76 mg/dL — ABNORMAL HIGH (ref 0.44–1.00)
Creatinine, Ser: 1.68 mg/dL — ABNORMAL HIGH (ref 0.44–1.00)
Creatinine, Ser: 1.6 mg/dL — ABNORMAL HIGH (ref 0.44–1.00)
GFR calc Af Amer: 32 mL/min — ABNORMAL LOW (ref >60)
GFR calc Af Amer: 34 mL/min — ABNORMAL LOW (ref >60)
GFR calc Af Amer: 36 mL/min — ABNORMAL LOW (ref >60)
GFR calc non Af Amer: 27 mL/min — ABNORMAL LOW (ref >60)
GFR calc non Af Amer: 29 mL/min — ABNORMAL LOW (ref >60)
GFR calc non Af Amer: 31 mL/min — ABNORMAL LOW (ref >60)
Glucose, Bld: 174 mg/dL — ABNORMAL HIGH (ref 70–99)
Glucose, Bld: 206 mg/dL — ABNORMAL HIGH (ref 70–99)
Glucose, Bld: 160 mg/dL — ABNORMAL HIGH (ref 70–99)
Potassium: 3.9 mmol/L (ref 3.5–5.1)
Potassium: 4.1 mmol/L (ref 3.5–5.1)
Potassium: 4.1 mmol/L (ref 3.5–5.1)
Sodium: 157 mmol/L — ABNORMAL HIGH (ref 135–145)
Sodium: 157 mmol/L — ABNORMAL HIGH (ref 135–145)
Sodium: 156 mmol/L — ABNORMAL HIGH (ref 135–145)

## 2019-06-02 LAB — GLUCOSE, CAPILLARY
Glucose-Capillary: 138 mg/dL — ABNORMAL HIGH (ref 70–99)
Glucose-Capillary: 146 mg/dL — ABNORMAL HIGH (ref 70–99)
Glucose-Capillary: 160 mg/dL — ABNORMAL HIGH (ref 70–99)
Glucose-Capillary: 163 mg/dL — ABNORMAL HIGH (ref 70–99)
Glucose-Capillary: 175 mg/dL — ABNORMAL HIGH (ref 70–99)
Glucose-Capillary: 199 mg/dL — ABNORMAL HIGH (ref 70–99)
Glucose-Capillary: 211 mg/dL — ABNORMAL HIGH (ref 70–99)

## 2019-06-02 LAB — LACTIC ACID, PLASMA: Lactic Acid, Venous: 1.7 mmol/L (ref 0.5–1.9)

## 2019-06-02 LAB — PROTIME-INR
INR: 1.4 — ABNORMAL HIGH (ref 0.8–1.2)
Prothrombin Time: 17.4 seconds — ABNORMAL HIGH (ref 11.4–15.2)

## 2019-06-02 MED ORDER — METOPROLOL TARTRATE 25 MG PO TABS
25.0000 mg | ORAL_TABLET | Freq: Four times a day (QID) | ORAL | Status: DC
  Administered 2019-06-02 – 2019-06-13 (×40): 25 mg via ORAL
  Filled 2019-06-02 (×40): qty 1

## 2019-06-02 NOTE — Progress Notes (Signed)
PROGRESS NOTE    Melanie [name]  SAY:[id number] DOB: 27-Jun-1941 DOA: 05/22/2019 PCP: [name], MD   Brief Narrative:  78 year old WF PMHx dementia, diabetes type 2 uncontrolled with complication, DM neuropathy, HLD, essential HTN, A. fib on warfarin, dysphagia, cholelithiasis, colon polyps,  Admitted on 05/22/2019, presented with complaint of epigastric abdominal pain nausea and vomiting, was found to have acute pancreatitis as well as sepsis of unknown origin. Currently further plan is continue current treatment.    Subjective: A/O x4, positive S OB, negative CP, negative abdominal pain.  Uncomfortable   Assessment & Plan:   Principal Problem:   Acute pancreatitis Active Problems:   HTN (hypertension)   DM (diabetes mellitus) type II controlled, neurological manifestation (HCC)   Afib (HCC)   SOB (shortness of breath)   Abdominal discomfort   Acute respiratory failure with hypoxia (HCC)   Cholelithiasis  Sepsis unknown etiology - Intra-abdominal source Vs CAP vs aspiration pneumonia -Negative cultures on admission - Patiently treated with IV vancomycin and Zosyn--> IV Unasyn - 6/6 CXR moderate bilateral pleural effusion with consolidation -ABG shows chronic compensated respiratory alkalosis along with metabolic acidosis and high anion gap - Patient now transition to meropenem with mild improvement in leukocytosis    Acute gallstone pancreatitis Initial work-up at the time of admission with ultrasound abdomen was negative for any CBD dilatation or CBD stone. MRCP was performed which was also negative for any CBD dilatation or CBD stone. Eventually patient will require cholecystectomy but currently not stable to consider that for now in the presence of ongoing sepsis.   Acute on CKD stage III (baseline Cr 1.13 ) multifactorial.   Initially patient was hypovolemic in the setting of pancreatitis.  Later on patient was hyperkalemic in the setting of  resuscitation efforts. Blood pressure changes with accelerated hypertension at the time of admission as well as hypotension intermittently could also contribute to AKI. Overdiuresis could also be the etiology. Now the BUN is also elevated, concern is for GI bleed. Responding to gentle IV hydration as well as crystalloid infusion with albumin. Continue to observe   Acute respiratory failure with hypoxia  Acute on chronic diastolic CHF Heart rate currently in 120s. On oral Cardizem and metoprolol at home. Patient was initially on Cardizem drip. IV Lopressor was added this as needed which was changed to scheduled Lopressor. Due to persistent hypertension clonidine 0.1 mg clonidine patch was also added. After the patient was placed on BiPAP her blood pressure dropped. Patient was transitioned to IV amiodarone drip for now. Due to concern for GI bleed patient's INR was reversed with vitamin K. Currently the patient is on amiodarone drip with persistent tachycardia.  Cardizem drip was added. Cardiology consult for further assistance with management of A. fib with RVR. Echocardiogram again showed preserved EF without any significant wall motion abnormalities.    Chronic atrial fibrillation with RVR  Bilateral pleural effusion  CAP vs aspiration pneumonia vs atelectasis vs mucous plugging   Multifactorial. Bilateral pleural effusions On chest x-ray S/P thoracentesis bilaterally.  Work-up only shows transudative effusion. Cultures so far negative.   Echocardiogram showed preserved EF without any significant wall motion abnormality or valvular abnormality. Bubble study ordered by CCM.   ABG showed significant hypoxia without any evidence of hypercarbia. BiPAP PRN but for now holding secondary to hypotension.  And the patient is able to tolerate high flow nasal cannula for now. Already on IV antibiotics.  Added IV steroids on 05/28/2019.  Will taper. Continue Xopenex  scheduled  nebulization as well as Xopenex as needed nebulization. Received multiple rounds of IV as needed Lasix. Continue vest and chest PT. Hypertonic saline nebulization as well.   GI bleed? - Multifactorial, secondary to supratherapeutic INR, delusional, hemorrhagic.  supratherapeutic INR   Presents with hemoglobin of 16.  Aggressively resuscitated.  Hemoglobin around 14 on 05/28/2019. The initial drop is secondary to dilution.  Now hemoglobin stable Concern is for possible GI bleed with melena. With normal platelets hemolysis is less likely.  CT abdomen pelvis negative for any active bleeding.   Appreciate GI input.   We will keep the patient n.p.o.  IV Protonix every 12 hours. Supratherapeutic INR reversed with vitamin K. Currently INR subtherapeutic and H&H stable. No plan for endoscopic evaluation.   Acute metabolic encephalopathy/dementia  In the setting of sepsis. CT head unremarkable. EEG did not show any evidence of acute seizures and showed metabolic encephalopathy. Metabolic work-up including TSH free T4, ammonia, B12 level normal. Mentation improved significantly from the day of admission. UDS is also only showing opiates which the patient is actually receiving in the hospital. Discussed with son patient does have some baseline memory deficit and may be having some mild delirium in the setting of sepsis. Continue to monitor.   Diabetes type 2 uncontrolled with complication  Hemoglobin A1c 7.5 in February 24, 2019. Currently hyperglycemia secondary to steroids. D5 added for hypernatremia.   Dysphasia  poor p.o. intake. Currently speech therapy has been consulted. Started on full liquid diet.   Obesity Body mass index is 35.68 kg/m.  Continue to monitor for now.   Hypokalemia Replaced   Hypernatremia  In the setting of poor p.o. intake as well as dehydration Corrected sodium as high as 160. Currently trending downwards. D5 added by CCM. Monitor.    DVT  prophylaxis: SCD Code Status: Full Family Communication: None Disposition Plan: TBD   Consultants:  GI Cardiology PCCM  Procedures/Significant Events:  6/11 PCXR:-Improved airspace disease in the RIGHT lung. - Persistent LEFT effusion and basilar atelectasis. -Hiatal hernia    I have personally reviewed and interpreted all radiology studies and my findings are as above.  VENTILATOR SETTINGS:    Cultures   Antimicrobials: Anti-infectives (From admission, onward)   Start     Ordered Stop   05/29/19 1000  meropenem (MERREM) 500 mg in sodium chloride 0.9 % 100 mL IVPB     05/29/19 0805     05/28/19 1700  Ampicillin-Sulbactam (UNASYN) 3 g in sodium chloride 0.9 % 100 mL IVPB  Status:  Discontinued     05/28/19 0735 05/29/19 0805   05/27/19 1200  Ampicillin-Sulbactam (UNASYN) 3 g in sodium chloride 0.9 % 100 mL IVPB  Status:  Discontinued     05/27/19 1044 05/28/19 0735   05/23/19 1600  piperacillin-tazobactam (ZOSYN) IVPB 3.375 g  Status:  Discontinued     05/23/19 0933 05/27/19 1044   05/23/19 0800  piperacillin-tazobactam (ZOSYN) IVPB 3.375 g     05/23/19 0141 05/23/19 0911   05/23/19 0100  piperacillin-tazobactam (ZOSYN) IVPB 3.375 g     05/23/19 0059 05/23/19 0241       Devices    LINES / TUBES:      Continuous Infusions:  diltiazem (CARDIZEM) infusion 12.5 mg/hr (06/02/19 1045)   meropenem (MERREM) IV 500 mg (06/02/19 1032)     Objective: Vitals:   06/02/19 0337 06/02/19 0700 06/02/19 0800 06/02/19 0855  BP: 137/70  (!) 140/91   Pulse: (!) 110  (!) 126  Resp: (!) 29  (!) 26   Temp:   (!) 97.3 F (36.3 C) 98.5 F (36.9 C)  TempSrc:   Oral Axillary  SpO2:  96% 94%   Weight:      Height:        Intake/Output Summary (Last 24 hours) at 06/02/2019 1048 Last data filed at 06/02/2019 0844 Gross per 24 hour  Intake 997.02 ml  Output 1025 ml  Net -27.98 ml   Filed Weights   05/23/19 0355 05/31/19 0438 06/02/19 0300  Weight: 92.9 kg 94.3 kg  94 kg    Examination:  General: A/O x4, positive acute respiratory distress Eyes: negative scleral hemorrhage, negative anisocoria, negative icterus ENT: Negative Runny nose, negative gingival bleeding, Neck:  Negative scars, masses, torticollis, lymphadenopathy, JVD Lungs: Tachypneic, decreased breath sounds diffusely,  without wheezes or crackles Cardiovascular: Tachycardic, without murmur gallop or rub normal S1 and S2 Abdomen: Obese, negative abdominal pain, nondistended, positive soft, bowel sounds, no rebound, no ascites, no appreciable mass Extremities: No significant cyanosis, clubbing.  Anasarca Skin: Negative rashes, lesions, ulcers Psychiatric:  Negative depression, positive anxiety, negative fatigue, negative mania  Central nervous system:  Cranial nerves II through XII intact, tongue/uvula midline, all extremities muscle strength 5/5, sensation intact throughout, negative dysarthria, negative expressive aphasia, negative receptive aphasia.  .     Data Reviewed: Care during the described time interval was provided by me .  I have reviewed this patient's available data, including medical history, events of note, physical examination, and all test results as part of my evaluation.   CBC: Recent Labs  Lab 05/27/19 0911  05/29/19 1035  05/30/19 0500 05/30/19 1130 05/30/19 1700 05/31/19 0319 06/01/19 0641  WBC 17.5*   < > 31.0*   < > 24.7* 27.8* 36.7* 29.4* 27.0*  NEUTROABS 15.4*  --  27.2*  --   --   --   --   --  24.9*  HGB 15.1*   < > 11.1*   < > 8.6* 8.9* 9.4* 8.8* 9.6*  HCT 44.6   < > 33.9*   < > 26.0* 26.4* 28.1* 26.7* 29.8*  MCV 89.2   < > 91.4   < > 91.5 90.1 90.9 91.8 92.8  PLT 236   < > 303   < > 233 238 268 229 251   < > = values in this interval not displayed.   Basic Metabolic Panel: Recent Labs  Lab 05/27/19 0911  05/31/19 1028 06/01/19 0641 06/01/19 1635 06/01/19 2122 06/02/19 0814  NA 148*   < > 155* 156* 155* 154* 157*  K 2.9*   < > 3.7 3.6  3.7 3.9 3.9  CL 112*   < > 123* 123* 126* 123* 126*  CO2 21*   < > 21* 22 20* 21* 21*  GLUCOSE 200*   < > 227* 192* 240* 276* 174*  BUN 27*   < > 92* 73* 64* 67* 56*  CREATININE 1.17*   < > 2.39* 1.97* 1.90* 1.78* 1.76*  CALCIUM 8.0*   < > 8.6* 8.5* 8.1* 8.3* 8.4*  MG 1.8  --  2.6* 2.5*  --   --   --    < > = values in this interval not displayed.   GFR: Estimated Creatinine Clearance: 29.8 mL/min (A) (by C-G formula based on SCr of 1.76 mg/dL (H)). Liver Function Tests: Recent Labs  Lab 05/27/19 0911 05/29/19 1035 05/31/19 1028 06/01/19 0641  AST 17 29 22 19   ALT 23 35  22 22  ALKPHOS 72 79 68 59  BILITOT 1.3* 0.9 0.6 0.7  PROT 6.0* 5.4* 5.7* 5.4*  ALBUMIN 2.5* 2.1* 2.8* 2.7*   Recent Labs  Lab 05/29/19 1035  LIPASE 32   No results for input(s): AMMONIA in the last 168 hours. Coagulation Profile: Recent Labs  Lab 05/29/19 1617 05/30/19 0500 05/31/19 0319 06/01/19 0641 06/02/19 0814  INR 2.0* 1.4* 1.4* 1.4* 1.4*   Cardiac Enzymes: No results for input(s): CKTOTAL, CKMB, CKMBINDEX, TROPONINI in the last 168 hours. BNP (last 3 results) No results for input(s): PROBNP in the last 8760 hours. HbA1C: No results for input(s): HGBA1C in the last 72 hours. CBG: Recent Labs  Lab 06/01/19 1633 06/01/19 1947 06/02/19 0003 06/02/19 0508 06/02/19 0827  GLUCAP 217* 230* 211* 199* 163*   Lipid Profile: No results for input(s): CHOL, HDL, LDLCALC, TRIG, CHOLHDL, LDLDIRECT in the last 72 hours. Thyroid Function Tests: No results for input(s): TSH, T4TOTAL, FREET4, T3FREE, THYROIDAB in the last 72 hours. Anemia Panel: No results for input(s): VITAMINB12, FOLATE, FERRITIN, TIBC, IRON, RETICCTPCT in the last 72 hours. Urine analysis:    Component Value Date/Time   COLORURINE YELLOW 05/29/2019 1115   APPEARANCEUR CLOUDY (A) 05/29/2019 1115   LABSPEC 1.023 05/29/2019 1115   PHURINE 5.0 05/29/2019 1115   GLUCOSEU 150 (A) 05/29/2019 1115   HGBUR SMALL (A) 05/29/2019  1115   BILIRUBINUR NEGATIVE 05/29/2019 1115   BILIRUBINUR negative 11/20/2017 [place] 05/29/2019 1115   PROTEINUR NEGATIVE 05/29/2019 1115   UROBILINOGEN 0.2 11/20/2017 1447   UROBILINOGEN 1.0 11/30/2016 1856   NITRITE NEGATIVE 05/29/2019 1115   LEUKOCYTESUR NEGATIVE 05/29/2019 1115   Sepsis Labs: @LABRCNTIP (procalcitonin:4,lacticidven:4)  ) Recent Results (from the past 240 hour(s))  Culture, blood (routine x 2)     Status: None (Preliminary result)   Collection Time: 05/29/19 10:26 AM   Specimen: BLOOD  Result Value Ref Range Status   Specimen Description BLOOD LEFT ANTECUBITAL  Final   Special Requests AEROBIC BOTTLE ONLY Blood Culture adequate volume  Final   Culture   Final    NO GROWTH 3 DAYS Performed at [facility], [place] [address], [place], [place] [address]    Report Status PENDING  Incomplete  Culture, blood (routine x 2)     Status: None (Preliminary result)   Collection Time: 05/29/19 10:35 AM   Specimen: BLOOD LEFT ARM  Result Value Ref Range Status   Specimen Description BLOOD LEFT ARM  Final   Special Requests AEROBIC BOTTLE ONLY Blood Culture adequate volume  Final   Culture   Final    NO GROWTH 3 DAYS Performed at [facility], [place] [address] [place], [place] [address]    Report Status PENDING  Incomplete  SARS Coronavirus 2 (CEPHEID - Performed in [place] hospital lab), Hosp Order     Status: None   Collection Time: 05/29/19 10:43 AM   Specimen: Nasopharyngeal Swab  Result Value Ref Range Status   SARS Coronavirus 2 NEGATIVE NEGATIVE Final    Comment: (NOTE) If result is NEGATIVE SARS-CoV-2 target nucleic acids are NOT DETECTED. The SARS-CoV-2 RNA is generally detectable in upper and lower  respiratory specimens during the acute phase of infection. The lowest  concentration of SARS-CoV-2 viral copies this assay can detect is 250  copies / mL. A negative result does not preclude SARS-CoV-2 infection  and should not  be used as the sole basis for treatment or other  patient management  decisions.  A negative result may occur with  improper specimen collection / handling, submission of specimen other  than nasopharyngeal swab, presence of viral mutation(s) within the  areas targeted by this assay, and inadequate number of viral copies  (<250 copies / mL). A negative result must be combined with clinical  observations, patient history, and epidemiological information. If result is POSITIVE SARS-CoV-2 target nucleic acids are DETECTED. The SARS-CoV-2 RNA is generally detectable in upper and lower  respiratory specimens dur ing the acute phase of infection.  Positive  results are indicative of active infection with SARS-CoV-2.  Clinical  correlation with patient history and other diagnostic information is  necessary to determine patient infection status.  Positive results do  not rule out bacterial infection or co-infection with other viruses. If result is PRESUMPTIVE POSTIVE SARS-CoV-2 nucleic acids MAY BE PRESENT.   A presumptive positive result was obtained on the submitted specimen  and confirmed on repeat testing.  While 2019 novel coronavirus  (SARS-CoV-2) nucleic acids may be present in the submitted sample  additional confirmatory testing may be necessary for epidemiological  and / or clinical management purposes  to differentiate between  SARS-CoV-2 and other Sarbecovirus currently known to infect humans.  If clinically indicated additional testing with an alternate test  methodology [phone]) is advised. The SARS-CoV-2 RNA is generally  detectable in upper and lower respiratory sp ecimens during the acute  phase of infection. The expected result is Negative. Fact Sheet for Patients:  StrictlyIdeas.no Fact Sheet for Healthcare Providers: BankingDealers.co.za This test is not yet approved or cleared by the [place] FDA and has been authorized  for detection and/or diagnosis of SARS-CoV-2 by FDA under an Emergency Use Authorization (EUA).  This EUA will remain in effect (meaning this test can be used) for the duration of the COVID-19 declaration under Section 564(b)(1) of the Act, 21 U.S.C. section 360bbb-3(b)(1), unless the authorization is terminated or revoked sooner. Performed at [facility], [place] [address] [place], [place] [address]   Gram stain     Status: None   Collection Time: 05/30/19  9:23 AM   Specimen: Pleural, Right; Pleural Fluid  Result Value Ref Range Status   Specimen Description PLEURAL RIGHT  Final   Special Requests NONE  Final   Gram Stain   Final    FEW WBC PRESENT,BOTH PMN AND MONONUCLEAR NO ORGANISMS SEEN Performed at [facility], [place] [address] [place], [place] [address]    Report Status 05/30/2019 FINAL  Final  Culture, body fluid-bottle     Status: None (Preliminary result)   Collection Time: 05/30/19  9:23 AM   Specimen: Pleura  Result Value Ref Range Status   Specimen Description PLEURAL RIGHT  Final   Special Requests NONE  Final   Culture   Final    NO GROWTH 2 DAYS Performed at [place]  [address] [place], [place] [address]    Report Status PENDING  Incomplete  Culture, body fluid-bottle     Status: None (Preliminary result)   Collection Time: 05/31/19  2:02 PM   Specimen: Pleura  Result Value Ref Range Status   Specimen Description PLEURAL  Final   Special Requests FLUID  Final   Culture   Final    NO GROWTH < 24 HOURS Performed at [facility], [place] [address], [place], [place] [address]    Report Status PENDING  Incomplete  Gram stain     Status: None  Collection Time: 05/31/19  2:02 PM   Specimen: Pleura  Result Value Ref Range Status   Specimen Description PLEURAL  Final   Special Requests FLUID  Final   Gram Stain   Final    WBC PRESENT,BOTH PMN AND MONONUCLEAR NO ORGANISMS SEEN CYTOSPIN SMEAR Performed at [place], [address] [place], [place] [address]    Report Status 05/31/2019 FINAL  Final         Radiology Studies: Dg Chest 1 View  Result Date: 05/31/2019 CLINICAL DATA:  Status post left thoracentesis. EXAM: CHEST  1 VIEW COMPARISON:  Radiograph of same day. FINDINGS: Mild cardiomegaly is noted. Left pleural effusion is nearly resolved status post thoracentesis. No definite pneumothorax is noted. Stable right lung opacities are noted. Bony thorax is unremarkable. IMPRESSION: Left pleural effusion is nearly resolved status post thoracentesis. No pneumothorax is noted. Electronically Signed   By: Marijo Conception M.D.   On: 05/31/2019 13:55   Dg Chest Port 1 View  Result Date: 06/02/2019 CLINICAL DATA:  Pleural effusion ,sob EXAM: PORTABLE CHEST 1 VIEW COMPARISON:  Chest radiograph 06/01/2019 FINDINGS: Similar large cardiac silhouette. LEFT effusion and probable basilar atelectasis unchanged. Hiatal hernia noted behind the heart. There is improvement in the patchy airspace disease seen in the RIGHT lung. IMPRESSION: 1. Improved airspace disease in the RIGHT lung. 2. Persistent LEFT effusion and basilar atelectasis. 3. Hiatal hernia Electronically Signed   By: Suzy Bouchard M.D.   On: 06/02/2019 08:46   Dg Chest Port 1 View  Result Date: 06/01/2019 CLINICAL DATA:  Shortness of breath EXAM: PORTABLE CHEST 1 VIEW COMPARISON:  05/31/2019 FINDINGS: Cardiac shadow is stable. Aortic calcifications are again seen. Lungs are well aerated bilaterally. Small pleural effusions are again noted. Patchy infiltrate is noted within the right lung. IMPRESSION: Stable appearance when compare with previous day. Electronically Signed   By: Inez Catalina M.D.   On: 06/01/2019 09:14   Dg Chest Port 1 View  Result Date: 05/31/2019 CLINICAL DATA:  Oxygen desaturation, diabetes mellitus, hypertension EXAM: PORTABLE CHEST 1 VIEW COMPARISON:  Portable exam 1707 hours compared to 05/31/2019 at 1335 hours FINDINGS: Upper  normal heart size. Mediastinal contours and pulmonary vascularity normal. Persistent atelectasis and minimal pleural effusion at LEFT base. Minimal patchy infiltrate at RIGHT base. Remaining lungs clear. No pneumothorax identified, though portions of apices medially are obscured by patient's chin. Bones demineralized. IMPRESSION: Persistent LEFT pleural effusion and basilar atelectasis. Mild RIGHT basilar infiltrate. Electronically Signed   By: Lavonia Dana M.D.   On: 05/31/2019 17:28   Vas [place] Lower Extremity Venous (dvt)  Result Date: 06/01/2019  Lower Venous Study Indications: Swelling, and SOB.  Limitations: Body habitus and patient required to sit in upright position secondary to shortness of breath and hypoxia. Comparison Study: No prior study. Performing Technologist: Maudry Mayhew MHA, RDMS, RVT, RDCS  Examination Guidelines: A complete evaluation includes B-mode imaging, spectral Doppler, color Doppler, and power Doppler as needed of all accessible portions of each vessel. Bilateral testing is considered an integral part of a complete examination. Limited examinations for reoccurring indications may be performed as noted.  +---------+---------------+---------+-----------+----------+------------------+  RIGHT     Compressibility Phasicity Spontaneity Properties Summary             +---------+---------------+---------+-----------+----------+------------------+  CFV  Not visualized      +---------+---------------+---------+-----------+----------+------------------+  SFJ                                                        Not visualized      +---------+---------------+---------+-----------+----------+------------------+  FV Prox                             Yes                    patent by color                                                                 Doppler              +---------+---------------+---------+-----------+----------+------------------+  FV Mid                              Yes                    patent by color                                                                 Doppler             +---------+---------------+---------+-----------+----------+------------------+  FV Distal                           Yes                    patent by color                                                                 Doppler             +---------+---------------+---------+-----------+----------+------------------+  PFV                                                        Not visualized      +---------+---------------+---------+-----------+----------+------------------+  POP       Full            Yes       Yes                                        +---------+---------------+---------+-----------+----------+------------------+  PTV       Full                      Yes                                        +---------+---------------+---------+-----------+----------+------------------+  PERO      Full                      Yes                                        +---------+---------------+---------+-----------+----------+------------------+ Unable to perform all compression maneuvers secondary to patient position.  +---------+---------------+---------+-----------+----------+------------------+  LEFT      Compressibility Phasicity Spontaneity Properties Summary             +---------+---------------+---------+-----------+----------+------------------+  CFV                                                        Not visualized      +---------+---------------+---------+-----------+----------+------------------+  SFJ                                                        Not visualized      +---------+---------------+---------+-----------+----------+------------------+  FV Prox                             Yes                    patent by color                                                                  Doppler             +---------+---------------+---------+-----------+----------+------------------+  FV Mid                              Yes                    patent by color                                                                 Doppler             +---------+---------------+---------+-----------+----------+------------------+  FV Distal  Yes                    patent by color                                                                 Doppler             +---------+---------------+---------+-----------+----------+------------------+  PFV                                                        Not visualized      +---------+---------------+---------+-----------+----------+------------------+  POP       Full            Yes       Yes                                        +---------+---------------+---------+-----------+----------+------------------+  PTV       Full                      Yes                                        +---------+---------------+---------+-----------+----------+------------------+  PERO      Full                      Yes                                        +---------+---------------+---------+-----------+----------+------------------+ Unable to perform all compression maneuvers secondary to patient position.    Summary: Right: There is no evidence of deep vein thrombosis in the lower extremity. However, portions of this examination were limited- see technologist comments above. No cystic structure found in the popliteal fossa. Left: There is no evidence of deep vein thrombosis in the lower extremity. However, portions of this examination were limited- see technologist comments above. No cystic structure found in the popliteal fossa.  *See table(s) above for measurements and observations. Electronically signed by Curt Jews MD on 06/01/2019 at 4:57:22 PM.    Final    Ir Thoracentesis Asp Pleural Space W/img Guide  Result  Date: 05/31/2019 INDICATION: Patient with history of acute on chronic diastolic HF, acute hypoxic respiratory distress, and bilateral pleural effusions (right side drained 05/30/2019 yielding 1.2 L). Request is made for diagnostic and therapeutic left thoracentesis. EXAM: ULTRASOUND GUIDED DIAGNOSTIC AND THERAPEUTIC LEFT THORACENTESIS MEDICATIONS: 15 mL 1% lidocaine COMPLICATIONS: None immediate. PROCEDURE: An ultrasound guided thoracentesis was thoroughly discussed with the patient and questions answered. The benefits, risks, alternatives and complications were also discussed. The patient understands and wishes to proceed with the procedure. Written consent was obtained. Ultrasound was performed to localize and mark an adequate pocket of fluid in the left chest. The area was then prepped and  draped in the normal sterile fashion. 1% Lidocaine was used for local anesthesia. Under ultrasound guidance a 6 Fr Safe-T-Centesis catheter was introduced. Thoracentesis was performed. The catheter was removed and a dressing applied. FINDINGS: A total of approximately 650 mL of hazy gold fluid was removed. Samples were sent to the laboratory as requested by the clinical team. IMPRESSION: Successful ultrasound guided left thoracentesis yielding 650 mL of pleural fluid. Read by: Earley Abide, PA-C Electronically Signed   By: Jerilynn Mages.  Shick M.D.   On: 05/31/2019 14:27        Scheduled Meds:  feeding supplement (ENSURE ENLIVE)  237 mL Oral BID BM   insulin aspart  0-20 Units Subcutaneous Q4H   ipratropium-albuterol  3 mL Nebulization Q6H   metoprolol tartrate  25 mg Oral Q6H   pantoprazole (PROTONIX) IV  40 mg Intravenous Q12H   sodium chloride flush  10-40 mL Intracatheter Q12H   Continuous Infusions:  diltiazem (CARDIZEM) infusion 12.5 mg/hr (06/02/19 1045)   meropenem (MERREM) IV 500 mg (06/02/19 1032)     LOS: 10 days   The patient is critically ill with multiple organ systems failure and requires high  complexity decision making for assessment and support, frequent evaluation and titration of therapies, application of advanced monitoring technologies and extensive interpretation of multiple databases. Critical Care Time devoted to patient care services described in this note  Time spent: 40 minutes     Aayan Haskew, Geraldo Docker, MD [facility] Pager [phone]  If 7PM-7AM, please contact night-coverage [email or web address] Password [facility] 06/02/2019, 10:48 AM

## 2019-06-02 NOTE — Progress Notes (Signed)
Tried to wean pt down to 30L, pt's SpO2 dropped to 85%, was placed back on 40L and SpO2 came back up to 93-94%

## 2019-06-02 NOTE — Progress Notes (Signed)
RT was starting pts CPT and pt stated she did not want it that it makes her head hurt and her tummy hurt. RT did not perform CPT at this time, will try again later if pt is awake. Pt not in need of BIPAP PRN at this time. Pt respiratory status is stable at this time on HFNC 30 Lpm 70%. RT will continue to monitor.

## 2019-06-02 NOTE — Progress Notes (Signed)
RT called to bedside for pt desatting into low to mid 80's. RT removed HFNC ( 40 Lpm, 100%) and placed pt on BIPAP V60 on 20/12 with FIO2 of 90%. Pt sats are currently 91% on these settings. RT will continue to monitor.

## 2019-06-02 NOTE — Progress Notes (Signed)
Inpatient Diabetes Program Recommendations  AACE/ADA: New Consensus Statement on Inpatient Glycemic Control (2015)  Target Ranges:  Prepandial:   less than 140 mg/dL      Peak postprandial:   less than 180 mg/dL (1-2 hours)      Critically ill patients:  140 - 180 mg/dL   Lab Results  Component Value Date   GLUCAP 163 (H) 06/02/2019   HGBA1C 7.4 (H) 05/29/2019    Review of Glycemic Control Results for Melanie [name], Melanie [name] (MRN [id number]) as of 06/02/2019 12:19  Ref. Range 06/01/2019 16:33 06/01/2019 19:47 06/02/2019 00:03 06/02/2019 05:08 06/02/2019 08:27  Glucose-Capillary Latest Ref Range: 70 - 99 mg/dL 217 (H) 230 (H) 211 (H) 199 (H) 163 (H)  Diabetes history:DM2 Outpatient Diabetes medications:Metformin 500 mg BID Current orders for Inpatient glycemic control:Novolog 0-20 units Q4H  Inpatient Diabetes Program Recommendations:   Blood sugars>goal.  Consider adding Levemir 5 units bid.   Thanks,  Adah Perl, RN, BC-ADM Inpatient Diabetes Coordinator Pager [phone] (8a-5p)

## 2019-06-02 NOTE — Progress Notes (Signed)
*  PRELIMINARY RESULTS* Echocardiogram 2D limited w/ Bubble study Melanie [name] 06/02/2019, 3:21 PM

## 2019-06-02 NOTE — Progress Notes (Signed)
Pt is complaining of current chest pain, CPT was held at this time.

## 2019-06-02 NOTE — Progress Notes (Signed)
  Speech Language Pathology Treatment: Dysphagia  Patient Details Name: Melanie [name] MRN: [id number] DOB: 02/01/1941 Today's Date: 06/02/2019 Time: 1027-2536 SLP Time Calculation (min) (ACUTE ONLY): 12 min  Assessment / Plan / Recommendation Clinical Impression  Skilled treatment session focused on dysphagia goals. SLP received pt upright in bed with supper tray in front of her. SLP assisted pt with self-feeding thin liquids via cup and pudding. Pt with effective oral clearing, swift appearance of swallow and no overt s/s of aspiration. Rest "pauses" given to prevent fatigue. Pt's sats remained stable. This Probation officer made pt's nurse aware of need to assist pt with remained of meal and recommendation to remain on current diet. ST to follow.    HPI HPI: 78 y.o. female with medical history significant of DM2, HTN.  Patient presented to the ED with c/o epigastric abd pain, N/V.  Dx sepsis of unknown origin, acute pancreatitis, bilateral pleural effusions, acute respiratory distress.  S/p R sided thoracentesis 6/8 and s/p L sided thoracentesis 6/9. On 6/9 s/p procedure, patient exhibited increasing oxygen requirement, requiring NRB and then BiPAP.        SLP Plan  Continue with current plan of care       Recommendations  Diet recommendations: Thin liquid(full liquids) Liquids provided via: Cup;Straw Medication Administration: Whole meds with puree Supervision: Staff to assist with self feeding Compensations: Slow rate;Small sips/bites Postural Changes and/or Swallow Maneuvers: Seated upright 90 degrees                Oral Care Recommendations: Oral care BID Follow up Recommendations: (TBD) SLP Visit Diagnosis: Dysphagia, unspecified (R13.10) Plan: Continue with current plan of care       [place] 06/02/2019, 6:35 PM

## 2019-06-02 NOTE — Progress Notes (Signed)
Progress Note  Patient Name: Melanie [name] Date of Encounter: 06/02/2019  Primary Cardiologist: Sherren Mocha, MD   Subjective   She is happy to be eating some this morning. Notes some chest pain which is tender to touch. She reports a cough with some sputum which she attributes to aspiration.   Inpatient Medications    Scheduled Meds:  feeding supplement (ENSURE ENLIVE)  237 mL Oral BID BM   insulin aspart  0-20 Units Subcutaneous Q4H   ipratropium-albuterol  3 mL Nebulization Q6H   pantoprazole (PROTONIX) IV  40 mg Intravenous Q12H   sodium chloride flush  10-40 mL Intracatheter Q12H   Continuous Infusions:  amiodarone 30 mg/hr (06/02/19 0434)   diltiazem (CARDIZEM) infusion 12.5 mg/hr (06/02/19 0240)   meropenem (MERREM) IV 500 mg (06/01/19 2145)   PRN Meds: acetaminophen **OR** acetaminophen, HYDROmorphone (DILAUDID) injection, lidocaine (PF), LORazepam, metoprolol tartrate, ondansetron **OR** ondansetron (ZOFRAN) IV, sodium chloride flush, sodium chloride HYPERTONIC   Vital Signs    Vitals:   06/02/19 0254 06/02/19 0300 06/02/19 0337 06/02/19 0700  BP:  137/70 137/70   Pulse: (!) 112 (!) 147 (!) 110   Resp: (!) 28 (!) 28 (!) 29   Temp: 97.8 F (36.6 C) 97.8 F (36.6 C)    TempSrc: Oral     SpO2: 90% (!) 87%  96%  Weight:  94 kg    Height:        Intake/Output Summary (Last 24 hours) at 06/02/2019 0839 Last data filed at 06/01/2019 2249 Gross per 24 hour  Intake 637.02 ml  Output 1725 ml  Net -1087.98 ml   Filed Weights   05/23/19 0355 05/31/19 0438 06/02/19 0300  Weight: 92.9 kg 94.3 kg 94 kg    Telemetry    Atrial fibrillation with RVR - rates primarily 100s-120s - Personally Reviewed   Physical Exam   GEN: Sitting upright in bed eating breakfast in no acute distress.   Cardiac: IRIR, no murmurs, rubs, or gallops. TTP or right/central chest wall Respiratory: limited due to patient immobility.Scattered rhonchi on exam anteriorly GI:  NABS, Soft, nontender, non-distended  MS: mild non-pitting edema; No deformity. Neuro:  Nonfocal, moving all extremities spontaneously Psych: Normal affect   Labs    Chemistry Recent Labs  Lab 05/29/19 1035  05/31/19 1028 06/01/19 0641 06/01/19 1635 06/01/19 2122  NA 150*   < > 155* 156* 155* 154*  K 3.3*   < > 3.7 3.6 3.7 3.9  CL 113*   < > 123* 123* 126* 123*  CO2 19*   < > 21* 22 20* 21*  GLUCOSE 422*   < > 227* 192* 240* 276*  BUN 125*   < > 92* 73* 64* 67*  CREATININE 3.75*   < > 2.39* 1.97* 1.90* 1.78*  CALCIUM 7.8*   < > 8.6* 8.5* 8.1* 8.3*  PROT 5.4*  --  5.7* 5.4*  --   --   ALBUMIN 2.1*  --  2.8* 2.7*  --   --   AST 29  --  22 19  --   --   ALT 35  --  22 22  --   --   ALKPHOS 79  --  68 59  --   --   BILITOT 0.9  --  0.6 0.7  --   --   GFRNONAA 11*   < > 19* 24* 25* 27*  GFRAA 13*   < > 22* 28* 29* 31*  ANIONGAP 18*   < >  11 11 9 10    < > = values in this interval not displayed.     Hematology Recent Labs  Lab 05/30/19 1700 05/31/19 0319 06/01/19 0641  WBC 36.7* 29.4* 27.0*  RBC 3.09* 2.91* 3.21*  HGB 9.4* 8.8* 9.6*  HCT 28.1* 26.7* 29.8*  MCV 90.9 91.8 92.8  MCH 30.4 30.2 29.9  MCHC 33.5 33.0 32.2  RDW 15.0 15.3 15.7*  PLT 268 229 251    Cardiac EnzymesNo results for input(s): TROPONINI in the last 168 hours. No results for input(s): TROPIPOC in the last 168 hours.   BNPNo results for input(s): BNP, PROBNP in the last 168 hours.   DDimer No results for input(s): DDIMER in the last 168 hours.   Radiology    Dg Chest 1 View  Result Date: 05/31/2019 CLINICAL DATA:  Status post left thoracentesis. EXAM: CHEST  1 VIEW COMPARISON:  Radiograph of same day. FINDINGS: Mild cardiomegaly is noted. Left pleural effusion is nearly resolved status post thoracentesis. No definite pneumothorax is noted. Stable right lung opacities are noted. Bony thorax is unremarkable. IMPRESSION: Left pleural effusion is nearly resolved status post thoracentesis. No  pneumothorax is noted. Electronically Signed   By: Marijo Conception M.D.   On: 05/31/2019 13:55   Dg Chest Port 1 View  Result Date: 06/01/2019 CLINICAL DATA:  Shortness of breath EXAM: PORTABLE CHEST 1 VIEW COMPARISON:  05/31/2019 FINDINGS: Cardiac shadow is stable. Aortic calcifications are again seen. Lungs are well aerated bilaterally. Small pleural effusions are again noted. Patchy infiltrate is noted within the right lung. IMPRESSION: Stable appearance when compare with previous day. Electronically Signed   By: Inez Catalina M.D.   On: 06/01/2019 09:14   Dg Chest Port 1 View  Result Date: 05/31/2019 CLINICAL DATA:  Oxygen desaturation, diabetes mellitus, hypertension EXAM: PORTABLE CHEST 1 VIEW COMPARISON:  Portable exam 1707 hours compared to 05/31/2019 at 1335 hours FINDINGS: Upper normal heart size. Mediastinal contours and pulmonary vascularity normal. Persistent atelectasis and minimal pleural effusion at LEFT base. Minimal patchy infiltrate at RIGHT base. Remaining lungs clear. No pneumothorax identified, though portions of apices medially are obscured by patient's chin. Bones demineralized. IMPRESSION: Persistent LEFT pleural effusion and basilar atelectasis. Mild RIGHT basilar infiltrate. Electronically Signed   By: Lavonia Dana M.D.   On: 05/31/2019 17:28   Vas [place] Lower Extremity Venous (dvt)  Result Date: 06/01/2019  Lower Venous Study Indications: Swelling, and SOB.  Limitations: Body habitus and patient required to sit in upright position secondary to shortness of breath and hypoxia. Comparison Study: No prior study. Performing Technologist: Maudry Mayhew MHA, RDMS, RVT, RDCS  Examination Guidelines: A complete evaluation includes B-mode imaging, spectral Doppler, color Doppler, and power Doppler as needed of all accessible portions of each vessel. Bilateral testing is considered an integral part of a complete examination. Limited examinations for reoccurring indications may be  performed as noted.  +---------+---------------+---------+-----------+----------+------------------+  RIGHT     Compressibility Phasicity Spontaneity Properties Summary             +---------+---------------+---------+-----------+----------+------------------+  CFV                                                        Not visualized      +---------+---------------+---------+-----------+----------+------------------+  SFJ  Not visualized      +---------+---------------+---------+-----------+----------+------------------+  FV Prox                             Yes                    patent by color                                                                 Doppler             +---------+---------------+---------+-----------+----------+------------------+  FV Mid                              Yes                    patent by color                                                                 Doppler             +---------+---------------+---------+-----------+----------+------------------+  FV Distal                           Yes                    patent by color                                                                 Doppler             +---------+---------------+---------+-----------+----------+------------------+  PFV                                                        Not visualized      +---------+---------------+---------+-----------+----------+------------------+  POP       Full            Yes       Yes                                        +---------+---------------+---------+-----------+----------+------------------+  PTV       Full                      Yes                                        +---------+---------------+---------+-----------+----------+------------------+  PERO      Full                      Yes                                        +---------+---------------+---------+-----------+----------+------------------+ Unable  to perform all compression maneuvers secondary to patient position.  +---------+---------------+---------+-----------+----------+------------------+  LEFT      Compressibility Phasicity Spontaneity Properties Summary             +---------+---------------+---------+-----------+----------+------------------+  CFV                                                        Not visualized      +---------+---------------+---------+-----------+----------+------------------+  SFJ                                                        Not visualized      +---------+---------------+---------+-----------+----------+------------------+  FV Prox                             Yes                    patent by color                                                                 Doppler             +---------+---------------+---------+-----------+----------+------------------+  FV Mid                              Yes                    patent by color                                                                 Doppler             +---------+---------------+---------+-----------+----------+------------------+  FV Distal                           Yes                    patent by color  Doppler             +---------+---------------+---------+-----------+----------+------------------+  PFV                                                        Not visualized      +---------+---------------+---------+-----------+----------+------------------+  POP       Full            Yes       Yes                                        +---------+---------------+---------+-----------+----------+------------------+  PTV       Full                      Yes                                        +---------+---------------+---------+-----------+----------+------------------+  PERO      Full                      Yes                                         +---------+---------------+---------+-----------+----------+------------------+ Unable to perform all compression maneuvers secondary to patient position.    Summary: Right: There is no evidence of deep vein thrombosis in the lower extremity. However, portions of this examination were limited- see technologist comments above. No cystic structure found in the popliteal fossa. Left: There is no evidence of deep vein thrombosis in the lower extremity. However, portions of this examination were limited- see technologist comments above. No cystic structure found in the popliteal fossa.  *See table(s) above for measurements and observations. Electronically signed by Curt Jews MD on 06/01/2019 at 4:57:22 PM.    Final    Ir Thoracentesis Asp Pleural Space W/img Guide  Result Date: 05/31/2019 INDICATION: Patient with history of acute on chronic diastolic HF, acute hypoxic respiratory distress, and bilateral pleural effusions (right side drained 05/30/2019 yielding 1.2 L). Request is made for diagnostic and therapeutic left thoracentesis. EXAM: ULTRASOUND GUIDED DIAGNOSTIC AND THERAPEUTIC LEFT THORACENTESIS MEDICATIONS: 15 mL 1% lidocaine COMPLICATIONS: None immediate. PROCEDURE: An ultrasound guided thoracentesis was thoroughly discussed with the patient and questions answered. The benefits, risks, alternatives and complications were also discussed. The patient understands and wishes to proceed with the procedure. Written consent was obtained. Ultrasound was performed to localize and mark an adequate pocket of fluid in the left chest. The area was then prepped and draped in the normal sterile fashion. 1% Lidocaine was used for local anesthesia. Under ultrasound guidance a 6 Fr Safe-T-Centesis catheter was introduced. Thoracentesis was performed. The catheter was removed and a dressing applied. FINDINGS: A total of approximately 650 mL of hazy gold fluid was removed. Samples were sent to the laboratory as requested by the  clinical team. IMPRESSION: Successful ultrasound guided left thoracentesis yielding 650 mL of pleural fluid. Read by: Earley Abide, PA-C Electronically Signed   By: Jerilynn Mages.  Shick M.D.  On: 05/31/2019 14:27    Cardiac Studies   Echocardiogram 05/27/2019: IMPRESSIONS    1. The left ventricle has hyperdynamic systolic function, with an ejection fraction of >65%. The cavity size was decreased. Left ventricular diastolic Doppler parameters are indeterminate.  2. Left atrial size was not well visualized.  3. LA appears to be at least moderate to severely enlarged Not well seen.  4. Right atrial size was mildly dilated.  5. The mitral valve is abnormal. Mild thickening of the mitral valve leaflet. There is moderate mitral annular calcification present.  6. The aortic valve is tricuspid. Moderate thickening of the aortic valve. Mild calcification of the aortic valve.  7. The left ventricular function has unchanged.  8. The interatrial septum was not assessed.  Patient Profile     78 y.o. 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 Atrial fibrillation with RVR: patient presented with severe abdominal pain and imaging concerning for acute pancreatitis. She is NPO and currently on IV amiodarone and IV diltiazem for rhythm/rate control. Home coumadin held and IV heparin discontinued due to concerns for an active GI bleed.  - Will stop amiodarone as rhythm control unlikely to be achieved  - Start metoprolol tartrate 25mg  q6hr - Continue diltiazem gtt and wean as HR improves - Plan to resume anticoagulation once cleared to do so by GI given CHADSVASC=6  2. Acute hypoxic respiratory failure with bilateral pleural effusions: felt to be 2/2 acute diastolic CHF in the setting of IVF resuscitation. Echo 05/27/2019 with EF  >65%. S/p throracentesis yielding 1.2L from right side and 663mL from left side. On high flow O2 via [place].  - Continue management per PCCM/ primary team  3. Acute pancreatitis: diet advanced to liquids today.  - Continue management per GI/primary team  4. GI bleed: patient with melena, +occult stool. GI following with no plans for endoscopic procedures at this time. Home coumadin on hold - Resume anticoagulation when cleared to do so by GI given elevated stroke risk.   5. AKI: Cr peaked at 3.81 05/29/2019, improved to 1.78 today (baseline 1.2).  - Continue to monitor closely and avoid nephrotoxic agents.     For questions or updates, please contact [place] Please consult [email or web address] for contact info under Cardiology/STEMI.      Signed, Abigail Butts, PA-C  06/02/2019, 8:39 AM   [phone]

## 2019-06-02 NOTE — Progress Notes (Signed)
SLP Cancellation Note  Patient Details Name: Melanie [name] MRN: [id number] DOB: October 04, 1941   Cancelled treatment:    Pt declined, stating she wasn't feeling well, HR high and she wanted to lie back.  Called RN to relay her request for pain meds.  Will continue efforts to f/u for swallowing.       Juan Quam Laurice 06/02/2019, 10:12 AM

## 2019-06-03 DIAGNOSIS — R6520 Severe sepsis without septic shock: Secondary | ICD-10-CM

## 2019-06-03 DIAGNOSIS — R400 Somnolence: Secondary | ICD-10-CM

## 2019-06-03 DIAGNOSIS — E1140 Type 2 diabetes mellitus with diabetic neuropathy, unspecified: Secondary | ICD-10-CM

## 2019-06-03 LAB — PROTIME-INR
INR: 1.3 — ABNORMAL HIGH (ref 0.8–1.2)
Prothrombin Time: 16.3 seconds — ABNORMAL HIGH (ref 11.4–15.2)

## 2019-06-03 LAB — RESPIRATORY PANEL BY PCR

## 2019-06-03 LAB — CULTURE, BLOOD (ROUTINE X 2)
Culture: NO GROWTH
Culture: NO GROWTH
Special Requests: ADEQUATE
Special Requests: ADEQUATE

## 2019-06-03 LAB — GLUCOSE, CAPILLARY
Glucose-Capillary: 157 mg/dL — ABNORMAL HIGH (ref 70–99)
Glucose-Capillary: 158 mg/dL — ABNORMAL HIGH (ref 70–99)
Glucose-Capillary: 202 mg/dL — ABNORMAL HIGH (ref 70–99)
Glucose-Capillary: 163 mg/dL — ABNORMAL HIGH (ref 70–99)
Glucose-Capillary: 118 mg/dL — ABNORMAL HIGH (ref 70–99)

## 2019-06-03 LAB — COMPREHENSIVE METABOLIC PANEL
ALT: 32 U/L (ref 0–44)
AST: 21 U/L (ref 15–41)
Albumin: 2.2 g/dL — ABNORMAL LOW (ref 3.5–5.0)
Alkaline Phosphatase: 93 U/L (ref 38–126)
Anion gap: 12 (ref 5–15)
BUN: 48 mg/dL — ABNORMAL HIGH (ref 8–23)
CO2: 20 mmol/L — ABNORMAL LOW (ref 22–32)
Calcium: 8.2 mg/dL — ABNORMAL LOW (ref 8.9–10.3)
Chloride: 124 mmol/L — ABNORMAL HIGH (ref 98–111)
Creatinine, Ser: 1.62 mg/dL — ABNORMAL HIGH (ref 0.44–1.00)
GFR calc Af Amer: 35 mL/min — ABNORMAL LOW (ref >60)
GFR calc non Af Amer: 30 mL/min — ABNORMAL LOW (ref >60)
Glucose, Bld: 169 mg/dL — ABNORMAL HIGH (ref 70–99)
Potassium: 4.2 mmol/L (ref 3.5–5.1)
Sodium: 156 mmol/L — ABNORMAL HIGH (ref 135–145)
Total Bilirubin: 0.8 mg/dL (ref 0.3–1.2)
Total Protein: 4.9 g/dL — ABNORMAL LOW (ref 6.5–8.1)

## 2019-06-03 LAB — LIPASE, BLOOD: Lipase: 53 U/L — ABNORMAL HIGH (ref 11–51)

## 2019-06-03 LAB — CBC
HCT: 25.2 % — ABNORMAL LOW (ref 36.0–46.0)
Hemoglobin: 7.9 g/dL — ABNORMAL LOW (ref 12.0–15.0)
MCH: 30.6 pg (ref 26.0–34.0)
MCHC: 31.3 g/dL (ref 30.0–36.0)
MCV: 97.7 fL (ref 80.0–100.0)
Platelets: 90 10*3/uL — ABNORMAL LOW (ref 150–400)
RBC: 2.58 MIL/uL — ABNORMAL LOW (ref 3.87–5.11)
RDW: 16.8 % — ABNORMAL HIGH (ref 11.5–15.5)
WBC: 19.1 10*3/uL — ABNORMAL HIGH (ref 4.0–10.5)
nRBC: 0.7 % — ABNORMAL HIGH (ref 0.0–0.2)

## 2019-06-03 LAB — AMYLASE: Amylase: 79 U/L (ref 28–100)

## 2019-06-03 LAB — HEPARIN LEVEL (UNFRACTIONATED): Heparin Unfractionated: 0.2 IU/mL — ABNORMAL LOW (ref 0.30–0.70)

## 2019-06-03 LAB — MAGNESIUM: Magnesium: 2.3 mg/dL (ref 1.7–2.4)

## 2019-06-03 MED ORDER — HEPARIN (PORCINE) 25000 UT/250ML-% IV SOLN
1100.0000 [IU]/h | INTRAVENOUS | Status: AC
  Administered 2019-06-03: 1100 [IU]/h via INTRAVENOUS
  Administered 2019-06-04: 1300 [IU]/h via INTRAVENOUS
  Administered 2019-06-05: 1250 [IU]/h via INTRAVENOUS
  Filled 2019-06-03 (×3): qty 250

## 2019-06-03 MED ORDER — JEVITY 1.2 CAL PO LIQD
1000.0000 mL | ORAL | Status: DC
  Filled 2019-06-03: qty 1000

## 2019-06-03 MED ORDER — DEXTROSE 5 % IV SOLN
INTRAVENOUS | Status: DC
  Administered 2019-06-04: via INTRAVENOUS
  Administered 2019-06-05: 40 mL/h via INTRAVENOUS
  Administered 2019-06-06 – 2019-06-08 (×4): via INTRAVENOUS

## 2019-06-03 MED ORDER — PRO-STAT SUGAR FREE PO LIQD
30.0000 mL | Freq: Two times a day (BID) | ORAL | Status: DC
  Administered 2019-06-04 – 2019-06-09 (×7): 30 mL
  Filled 2019-06-03 (×11): qty 30

## 2019-06-03 MED ORDER — METHYLPREDNISOLONE SODIUM SUCC 40 MG IJ SOLR
40.0000 mg | Freq: Two times a day (BID) | INTRAMUSCULAR | Status: DC
  Administered 2019-06-03 – 2019-06-06 (×6): 40 mg via INTRAVENOUS
  Filled 2019-06-03 (×6): qty 1

## 2019-06-03 MED ORDER — ENSURE ENLIVE PO LIQD: 237.0000 mL | Freq: Three times a day (TID) | ORAL | Status: DC

## 2019-06-03 MED ORDER — OSMOLITE 1.5 CAL PO LIQD
1000.0000 mL | ORAL | Status: DC
  Filled 2019-06-03 (×5): qty 1000

## 2019-06-03 MED ORDER — ALBUMIN HUMAN 25 % IV SOLN
50.0000 g | Freq: Once | INTRAVENOUS | Status: AC
  Administered 2019-06-03: 50 g via INTRAVENOUS
  Filled 2019-06-03: qty 200

## 2019-06-03 MED ORDER — ENSURE ENLIVE PO LIQD
237.0000 mL | Freq: Two times a day (BID) | ORAL | Status: DC
  Administered 2019-06-03 – 2019-06-06 (×2): 237 mL via ORAL

## 2019-06-03 MED ORDER — ADULT MULTIVITAMIN W/MINERALS CH
1.0000 | ORAL_TABLET | Freq: Every day | ORAL | Status: DC
  Administered 2019-06-03 – 2019-06-08 (×6): 1 via ORAL
  Filled 2019-06-03 (×6): qty 1

## 2019-06-03 NOTE — Progress Notes (Signed)
Progress Note  Patient Name: Melanie [name] Date of Encounter: 06/03/2019  Primary Cardiologist: Sherren Mocha, MD   Subjective   On my interview this AM, she is very sleepy. Moaning but does not respond to conversation. Wakes up more with sternal rub but still not conversant. Spoke with RN, who stated patient had been complaining of discomfort, received 0.5 mg ativan IV this AM.   Inpatient Medications    Scheduled Meds:  feeding supplement (ENSURE ENLIVE)  237 mL Oral TID BM   insulin aspart  0-20 Units Subcutaneous Q4H   ipratropium-albuterol  3 mL Nebulization Q6H   metoprolol tartrate  25 mg Oral Q6H   multivitamin with minerals  1 tablet Oral Daily   pantoprazole (PROTONIX) IV  40 mg Intravenous Q12H   sodium chloride flush  10-40 mL Intracatheter Q12H   Continuous Infusions:  albumin human     diltiazem (CARDIZEM) infusion 10 mg/hr (06/03/19 0600)   meropenem (MERREM) IV 500 mg (06/02/19 2136)   PRN Meds: acetaminophen **OR** acetaminophen, HYDROmorphone (DILAUDID) injection, lidocaine (PF), LORazepam, metoprolol tartrate, ondansetron **OR** ondansetron (ZOFRAN) IV, sodium chloride flush, sodium chloride HYPERTONIC   Vital Signs    Vitals:   06/03/19 0500 06/03/19 0718 06/03/19 0823 06/03/19 0934  BP:   127/62 106/83  Pulse:  (!) 108 (!) 125   Resp:  (!) 22 (!) 22 (!) 32  Temp:   97.8 F (36.6 C)   TempSrc:   Oral   SpO2:  (!) 88% (!) 82% 95%  Weight: 94.2 kg     Height:        Intake/Output Summary (Last 24 hours) at 06/03/2019 1018 Last data filed at 06/03/2019 0830 Gross per 24 hour  Intake 360 ml  Output 1000 ml  Net -640 ml   Filed Weights   05/31/19 0438 06/02/19 0300 06/03/19 0500  Weight: 94.3 kg 94 kg 94.2 kg    Telemetry    Atrial fibrillation with RVR - rates <100 while sleeping, in the low 100s when awake - Personally Reviewed  Physical Exam   GEN: somnolent, moaning, not responding to voice, moans louder with sternal  rub NECK: No JVD visualized CARDIAC: irregularly irregular S1 and S2, no murmurs, rubs, gallops. Radial and DP pulses 2+ bilaterally. RESPIRATORY:  No wheezing but scatted rhonchi  ABDOMEN: Soft, non-tender, non-distended MUSCULOSKELETAL:  Trace LE bilateral edema; No deformity  SKIN: Warm and dry NEUROLOGIC:  somnolent PSYCHIATRIC:  somnolent   Labs    Chemistry Recent Labs  Lab 05/31/19 1028 06/01/19 0641  06/02/19 1845 06/02/19 2227 06/03/19 0301  NA 155* 156*   < > 157* 156* 156*  K 3.7 3.6   < > 4.1 4.1 4.2  CL 123* 123*   < > 124* 126* 124*  CO2 21* 22   < > 25 23 20*  GLUCOSE 227* 192*   < > 206* 160* 169*  BUN 92* 73*   < > 49* 48* 48*  CREATININE 2.39* 1.97*   < > 1.68* 1.60* 1.62*  CALCIUM 8.6* 8.5*   < > 8.5* 8.3* 8.2*  PROT 5.7* 5.4*  --   --   --  4.9*  ALBUMIN 2.8* 2.7*  --   --   --  2.2*  AST 22 19  --   --   --  21  ALT 22 22  --   --   --  32  ALKPHOS 68 59  --   --   --  93  BILITOT 0.6 0.7  --   --   --  0.8  GFRNONAA 19* 24*   < > 29* 31* 30*  GFRAA 22* 28*   < > 34* 36* 35*  ANIONGAP 11 11   < > 8 7 12    < > = values in this interval not displayed.     Hematology Recent Labs  Lab 05/31/19 0319 06/01/19 0641 06/03/19 0301  WBC 29.4* 27.0* 19.1*  RBC 2.91* 3.21* 2.58*  HGB 8.8* 9.6* 7.9*  HCT 26.7* 29.8* 25.2*  MCV 91.8 92.8 97.7  MCH 30.2 29.9 30.6  MCHC 33.0 32.2 31.3  RDW 15.3 15.7* 16.8*  PLT 229 251 90*    Cardiac EnzymesNo results for input(s): TROPONINI in the last 168 hours. No results for input(s): TROPIPOC in the last 168 hours.   BNPNo results for input(s): BNP, PROBNP in the last 168 hours.   DDimer No results for input(s): DDIMER in the last 168 hours.   Radiology    Dg Chest Port 1 View  Result Date: 06/02/2019 CLINICAL DATA:  Pleural effusion ,sob EXAM: PORTABLE CHEST 1 VIEW COMPARISON:  Chest radiograph 06/01/2019 FINDINGS: Similar large cardiac silhouette. LEFT effusion and probable basilar atelectasis unchanged.  Hiatal hernia noted behind the heart. There is improvement in the patchy airspace disease seen in the RIGHT lung. IMPRESSION: 1. Improved airspace disease in the RIGHT lung. 2. Persistent LEFT effusion and basilar atelectasis. 3. Hiatal hernia Electronically Signed   By: Suzy Bouchard M.D.   On: 06/02/2019 08:46   Vas [place] Lower Extremity Venous (dvt)  Result Date: 06/01/2019  Lower Venous Study Indications: Swelling, and SOB.  Limitations: Body habitus and patient required to sit in upright position secondary to shortness of breath and hypoxia. Comparison Study: No prior study. Performing Technologist: Maudry Mayhew MHA, RDMS, RVT, RDCS  Examination Guidelines: A complete evaluation includes B-mode imaging, spectral Doppler, color Doppler, and power Doppler as needed of all accessible portions of each vessel. Bilateral testing is considered an integral part of a complete examination. Limited examinations for reoccurring indications may be performed as noted.  +---------+---------------+---------+-----------+----------+------------------+  RIGHT     Compressibility Phasicity Spontaneity Properties Summary             +---------+---------------+---------+-----------+----------+------------------+  CFV                                                        Not visualized      +---------+---------------+---------+-----------+----------+------------------+  SFJ                                                        Not visualized      +---------+---------------+---------+-----------+----------+------------------+  FV Prox                             Yes                    patent by color  Doppler             +---------+---------------+---------+-----------+----------+------------------+  FV Mid                              Yes                    patent by color                                                                 Doppler              +---------+---------------+---------+-----------+----------+------------------+  FV Distal                           Yes                    patent by color                                                                 Doppler             +---------+---------------+---------+-----------+----------+------------------+  PFV                                                        Not visualized      +---------+---------------+---------+-----------+----------+------------------+  POP       Full            Yes       Yes                                        +---------+---------------+---------+-----------+----------+------------------+  PTV       Full                      Yes                                        +---------+---------------+---------+-----------+----------+------------------+  PERO      Full                      Yes                                        +---------+---------------+---------+-----------+----------+------------------+ Unable to perform all compression maneuvers secondary to patient position.  +---------+---------------+---------+-----------+----------+------------------+  LEFT      Compressibility Phasicity Spontaneity Properties Summary             +---------+---------------+---------+-----------+----------+------------------+  CFV  Not visualized      +---------+---------------+---------+-----------+----------+------------------+  SFJ                                                        Not visualized      +---------+---------------+---------+-----------+----------+------------------+  FV Prox                             Yes                    patent by color                                                                 Doppler             +---------+---------------+---------+-----------+----------+------------------+  FV Mid                              Yes                    patent by color                                                                  Doppler             +---------+---------------+---------+-----------+----------+------------------+  FV Distal                           Yes                    patent by color                                                                 Doppler             +---------+---------------+---------+-----------+----------+------------------+  PFV                                                        Not visualized      +---------+---------------+---------+-----------+----------+------------------+  POP       Full            Yes       Yes                                        +---------+---------------+---------+-----------+----------+------------------+  PTV       Full                      Yes                                        +---------+---------------+---------+-----------+----------+------------------+  PERO      Full                      Yes                                        +---------+---------------+---------+-----------+----------+------------------+ Unable to perform all compression maneuvers secondary to patient position.    Summary: Right: There is no evidence of deep vein thrombosis in the lower extremity. However, portions of this examination were limited- see technologist comments above. No cystic structure found in the popliteal fossa. Left: There is no evidence of deep vein thrombosis in the lower extremity. However, portions of this examination were limited- see technologist comments above. No cystic structure found in the popliteal fossa.  *See table(s) above for measurements and observations. Electronically signed by Curt Jews MD on 06/01/2019 at 4:57:22 PM.    Final     Cardiac Studies   Echocardiogram 05/27/2019:  1. The left ventricle has hyperdynamic systolic function, with an ejection fraction of >65%. The cavity size was decreased. Left ventricular diastolic Doppler parameters are indeterminate.  2. Left atrial size was not well visualized.  3. LA  appears to be at least moderate to severely enlarged Not well seen.  4. Right atrial size was mildly dilated.  5. The mitral valve is abnormal. Mild thickening of the mitral valve leaflet. There is moderate mitral annular calcification present.  6. The aortic valve is tricuspid. Moderate thickening of the aortic valve. Mild calcification of the aortic valve.  7. The left ventricular function has unchanged.  8. The interatrial septum was not assessed.  Patient Profile     78 y.o. 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 Amio stopped. Now somnolent after a dose of ativan with borderline BP - heart rate driven by acute illness - HR is improving but still above goal. However, with ativan/somnolence and now borderline hypotension, no room to increase medications currently - tolerating metoprolol tartrate 25mg  q6hr. Would like to increase but with hypotension will hold off. - Continue diltiazem gtt and wean as HR improves - Plan to resume anticoagulation once cleared to do so by GI given CHADSVASC=6. Was on warfarin (followed by PCP) as an outpatient as she could not afford DOAC. She was supratherapeutic on admission, likely in the setting of her acute illness.  Acute hypoxic respiratory failure with bilateral pleural effusions: felt to be 2/2 acute diastolic CHF in the setting of IVF resuscitation. Echo 05/27/2019 with EF >65%. S/p throracentesis yielding 1.2L from right side and 660mL from left side. On high flow O2 via [place].  - Continue management per PCCM/ primary team - with her somnolence and respiratory status, would avoid benzos and narcotics as much as possible -  not actively [place], likely will require  ongoing hydration with sepsis/pancreatitis/hypernatremia, monitor closely  Acute pancreatitis, sepsis of unknown etiology - Continue management per GI/primary team - white count on presentation 26.7, nadir 13.6, peak 35, today 19 - on meropenem  GI bleed: patient with melena, +occult stool. GI following with no plans for endoscopic procedures at this time. Home coumadin on hold - Resume anticoagulation when cleared to do so by GI given elevated stroke risk.  -Hgb today 7.9, was 15.4 on presentation, downtrending  AKI, hypernatremia: Cr peaked at 3.81 05/29/2019, 1.62 today (baseline 1.2).  - Continue to monitor closely and avoid nephrotoxic agents.  -she is still hypernatremic at 156, though trend has been largely stable. Will defer to primary team, but would consider free water repletion to gradually improve her sodium  For questions or updates, please contact [place] Please consult [email or web address] for contact info under Cardiology/STEMI.     Signed, Buford Dresser, MD  06/03/2019, 10:18 AM

## 2019-06-03 NOTE — Progress Notes (Addendum)
Nutrition Follow-up  RD working remotely.  DOCUMENTATION CODES:   Obesity unspecified  INTERVENTION:   -Continue Ensure Enlive po to BID, each supplement provides 350 kcal and 20 grams of protein -MVI with minerals daily -Feeding assistance with meals -Magic cup TID with meals, each supplement provides 290 kcal and 9 grams of protein -Once feeding access is obtained:   Initiate Osmolite 1.5 @ 25 ml/hr via feeding tube and increase by 10 ml every 8 hours to goal rate of 45 ml/hr.   30 ml Prostat BID.    Tube feeding regimen provides 1820 kcal (100% of needs), 98 grams of protein, and 823 ml of H2O.   NUTRITION DIAGNOSIS:   Inadequate oral intake related to altered GI function as evidenced by NPO status.  Progressing; on full liquid diet  GOAL:   Patient will meet greater than or equal to 90% of their needs  Progressing   MONITOR:   Diet advancement, Labs, Weight trends, Skin, I & O's  REASON FOR ASSESSMENT:   Low [place]    ASSESSMENT:   Melanie [name] is a 78 y.o. female with medical history significant of DM2, HTN.  Patient presents to the ED with c/o epigastric abd pain, N/V.  Symptoms onset suddenly 3 hours ago, symptoms are severe, nothing makes better or worse, pain located in epigastrium.  6/1- s/p MRCP- no stone revealed 6/5- s/p BSE- recommend continued NPO status; s/p soap suds enema with BM 6/6- CXR revealed moderate bilateral pleural effusions 6/7- BSE cancelled secondary to increased O2 support 6/8- s/p rt thoracentesis (1.2 L of hazy gold fluid removed), s/p BSE- recommend continued NPO with ice chips after oral care 6/9- s/p lt thoracentesis (650 ml hazy gold fluid removed), s/p [place] bilateral chest revealed bilateral lung sliding, trivial pleural effusions, and significant atelectasis  6/10- s/p BSE- advanced to full liquids  Reviewed I/O's: -640 ml x 24 hours and +4.2 L since admission  UOP: 1 L x 24 hours  Pt has been advanced to full  liquid diet, which she is tolerating well, but SLP reports she requires feeding assistance secondary to fatigue. Meal completion poor; PO 5-10%. Noted Ensure supplements were ordered on 06/01/19, which pt is consuming. She remains on high flow nasal cannula.  Per MD notes, pt more lethargic today. Plan for cortrak tube placement today for TF initiation, however, placement unsuccessful due to pt desatting. Plan for IR to place feeding tube.  Labs reviewed: Na: 156, CBGS: 138-157 (inpatient orders for glycemic control are 0-20 units insulin aspart every 4 hours).   Diet Order:   Diet Order            Diet full liquid Room service appropriate? No; Fluid consistency: Thin  Diet effective now              EDUCATION NEEDS:   Not appropriate for education at this time  Skin:  Skin Assessment: Reviewed RN Assessment  Last BM:  06/02/19 (via rectal tube)  Height:   Ht Readings from Last 1 Encounters:  05/23/19 5\' 4"  (1.626 m)    Weight:   Wt Readings from Last 1 Encounters:  06/03/19 94.2 kg    Ideal Body Weight:  54.5 kg  BMI:  Body mass index is 35.65 kg/m.  Estimated Nutritional Needs:   Kcal:  1700-1900  Protein:  95-110 grams  Fluid:  1.7-1.9 L    Melanie [name] A. Jimmye Norman, [place], LDN, [place] Registered Dietitian II Certified Diabetes Care and Education Specialist Pager: [phone]  After hours Pager: [phone]

## 2019-06-03 NOTE — Progress Notes (Signed)
Update given to son, Kwana Ringel, over the phone.

## 2019-06-03 NOTE — Progress Notes (Signed)
PROGRESS NOTE    Melanie [name]  NWG:[id number] DOB: Aug 10, 1941 DOA: 05/22/2019 PCP: [name], MD   Brief Narrative:  78 year old WF PMHx dementia, diabetes type 2 uncontrolled with complication, DM neuropathy, HLD, essential HTN, A. fib on warfarin, dysphagia, cholelithiasis, colon polyps,  Admitted on 05/22/2019, presented with complaint of epigastric abdominal pain nausea and vomiting, was found to have acute pancreatitis as well as sepsis of unknown origin. Currently further plan is continue current treatment.    Subjective: 6/12 patient arousable, withdraws to painful stimuli, does not follow commands.    Assessment & Plan:   Principal Problem:   Acute pancreatitis Active Problems:   HTN (hypertension)   DM (diabetes mellitus) type II controlled, neurological manifestation (HCC)   Afib (HCC)   SOB (shortness of breath)   Abdominal discomfort   Acute respiratory failure with hypoxia (HCC)   Cholelithiasis  Sepsis unknown etiology - Intra-abdominal source Vs CAP vs aspiration pneumonia -Negative cultures on admission - Patiently treated with IV vancomycin and Zosyn--> IV Unasyn - 6/6 CXR moderate bilateral pleural effusion with consolidation -ABG shows chronic compensated respiratory alkalosis along with metabolic acidosis and high anion gap - Continue meropenem for 7-day course.  Leukocytosis improving.  Acute gallstone pancreatitis -Per GI note 6/9 patient will require surgical referral/consultation to consider a cholecystectomy prior to discharge as result of previously felt pancreatitis due to likely biliary/gallstone disease that occurred upon her initial admission.  Once patient more stable. - MRCP negative for CBD dilation or CBD stone  Acute on CKD stage III (baseline Cr 1.13 ) -Multifactorial sepsis, diabetes type 2 uncontrolled, overdiuresis.  BUN remains elevated (GI bleed?) - Patient remains intravascularly depleted secondary to  hypoalbuminemia. - 6/12 Albumin 50 g - Will start to gently hydrate with D5W 57ml/hr until core track tube can be placed Recent Labs  Lab 06/01/19 2122 06/02/19 0814 06/02/19 1845 06/02/19 2227 06/03/19 0301  CREATININE 1.78* 1.76* 1.68* 1.60* 1.62*  -Some mild improvement in creatinine already noted   Acute respiratory failure with hypoxia -Multifactorial Bilateral Pleural Effusion, CAP vs atelectasis vs mucous plugging - Mild improvement, remains on HFNC 25 L/min: FiO2 60% - PCXR 6/13 pending -ABG 6/13 pending  Bilateral pleural effusion -6/8 s/p RIGHT thoracentesis/6/9 s/p LEFT thoracentesis see below; work-up showed transudate of effusion  CAP vs aspiration pneumonia vs atelectasis vs mucous plugging - Patient unable to participate with flutter valve - Physiotherapy vest - Solu-Medrol 40 mg twice daily -DuoNeb every 6 hours - Patient had received hypertonic nebulization, may restart in the a.m. dependent upon findings of PCXR -BiPAP held secondary to drop in BP  Acute on chronic diastolic CHF - Cardizem drip - Metoprolol IV PRN - Metoprolol 25 mg every 6 -Strict in and out -Daily weight Filed Weights   05/31/19 0438 06/02/19 0300 06/03/19 0500  Weight: 94.3 kg 94 kg 94.2 kg  . Chronic atrial fibrillation with RVR -We will start patient on heparin drip today per GI recommendation from 6/9.  Monitor for any bleeding.  GI bleed? - Multifactorial, secondary to supratherapeutic INR, hemorrhagic. - Occult blood pending - Trend hemoglobin - Trend INR - Protonix 40 mg twice daily  supratherapeutic INR -Resolved   We will keep the patient n.p.o.  IV Protonix every 12 hours. Supratherapeutic INR reversed with vitamin K. Currently INR subtherapeutic and H&H stable. No plan for endoscopic evaluation.   Acute metabolic encephalopathy/dementia/CVA? - CT head unremarkable, and EEG unremarkable.  However patient has patent foramen ovale by echocardiogram with  bubble  study. - If within the next 24 to 48 hours all metabolic abnormalities have been corrected and still no improvement in patient's cognition will obtain MRI   Diabetes type 2 uncontrolled with complication  Hemoglobin A1c 7.5 in February 24, 2019. Currently hyperglycemia secondary to steroids. D5 added for hypernatremia.   Dysphasia  poor p.o. intake. Currently speech therapy has been consulted. Started on full liquid diet.   Obesity Body mass index is 35.68 kg/m.  Continue to monitor for now.   Hypokalemia Replaced   Hypernatremia  In the setting of poor p.o. intake as well as dehydration Corrected sodium as high as 160. Currently trending downwards. D5 added by CCM. Monitor.    DVT prophylaxis: SCD Code Status: Full Family Communication: None Disposition Plan: TBD   Consultants:  [facility] Neurology  Procedures/Significant Events:  6/4 EEG: Abnormal EEG due to the presence of general background slowing and triphasic waves.  This is seen most commonly in encephalopathic states, most commonly, but not limited to, metabolic encephalopathies 6/5 echocardiogram:-The left ventricle has hyperdynamic systolic function, with an ejection fraction of >65%.-The cavity size was decreased.  -Left Atrium: moderate to severely enlarged . -Right Atrium: mildly dilated. pressure is estimated at 10 mmHg. 6/8 RIGHT thoracentesis.: 1.2 L aspirated 6/9 LEFT thoracentesis; 670ml 6/11 PCXR:-Improved airspace disease in the RIGHT lung. - Persistent LEFT effusion and basilar atelectasis. -Hiatal hernia 6/12 echocardiogram with bubble study: Abnormal bubble contrast study.  Interstitial shunt noted suggestive of patent foramen ovale -LVEF 55 - 60%     I have personally reviewed and interpreted all radiology studies and my findings are as above.  VENTILATOR SETTINGS:    Cultures   Antimicrobials: Anti-infectives (From admission, onward)   Start     Ordered Stop    05/29/19 1000  meropenem (MERREM) 500 mg in sodium chloride 0.9 % 100 mL IVPB     05/29/19 0805     05/28/19 1700  Ampicillin-Sulbactam (UNASYN) 3 g in sodium chloride 0.9 % 100 mL IVPB  Status:  Discontinued     05/28/19 0735 05/29/19 0805   05/27/19 1200  Ampicillin-Sulbactam (UNASYN) 3 g in sodium chloride 0.9 % 100 mL IVPB  Status:  Discontinued     05/27/19 1044 05/28/19 0735   05/23/19 1600  piperacillin-tazobactam (ZOSYN) IVPB 3.375 g  Status:  Discontinued     05/23/19 0933 05/27/19 1044   05/23/19 0800  piperacillin-tazobactam (ZOSYN) IVPB 3.375 g     05/23/19 0141 05/23/19 0911   05/23/19 0100  piperacillin-tazobactam (ZOSYN) IVPB 3.375 g     05/23/19 0059 05/23/19 0241       Devices    LINES / TUBES:      Continuous Infusions:  diltiazem (CARDIZEM) infusion 10 mg/hr (06/03/19 0600)   meropenem (MERREM) IV 500 mg (06/02/19 2136)     Objective: Vitals:   06/03/19 0409 06/03/19 0500 06/03/19 0718 06/03/19 0823  BP:    127/62  Pulse:   (!) 108 (!) 125  Resp: (!) 33  (!) 22 (!) 22  Temp: 98.1 F (36.7 C)   97.8 F (36.6 C)  TempSrc: Oral   Oral  SpO2: 95%  (!) 88% (!) 82%  Weight:  94.2 kg    Height:        Intake/Output Summary (Last 24 hours) at 06/03/2019 0846 Last data filed at 06/03/2019 0541 Gross per 24 hour  Intake --  Output 1000 ml  Net -1000 ml   [employer]  05/31/19 0438 06/02/19 0300 06/03/19 0500  Weight: 94.3 kg 94 kg 94.2 kg   Physical Exam:  General: A/O x0, responds to painful stimuli, positive acute respiratory distress Eyes: negative scleral hemorrhage, negative anisocoria, negative icterus ENT: Negative Runny nose, negative gingival bleeding, Neck:  Negative scars, masses, torticollis, lymphadenopathy, JVD Lungs: Tachypneic, decreased breath sounds diffusely, mild expiratory wheeze, negative  crackles Cardiovascular: Tachycardic, without murmur gallop or rub normal S1 and S2 Abdomen: Obese, negative abdominal pain,  nondistended, positive soft, bowel sounds, no rebound, no ascites, no appreciable mass Extremities: No significant cyanosis, clubbing, anasarca Skin: Negative rashes, lesions, ulcers Psychiatric: Unable to assess secondary to altered mental status Central nervous system: Moans, withdraws to painful stimuli       Data Reviewed: Care during the described time interval was provided by me .  I have reviewed this patient's available data, including medical history, events of note, physical examination, and all test results as part of my evaluation.   CBC: Recent Labs  Lab 05/27/19 0911  05/29/19 1035  05/30/19 1130 05/30/19 1700 05/31/19 0319 06/01/19 0641 06/03/19 0301  WBC 17.5*   < > 31.0*   < > 27.8* 36.7* 29.4* 27.0* 19.1*  NEUTROABS 15.4*  --  27.2*  --   --   --   --  24.9*  --   HGB 15.1*   < > 11.1*   < > 8.9* 9.4* 8.8* 9.6* 7.9*  HCT 44.6   < > 33.9*   < > 26.4* 28.1* 26.7* 29.8* 25.2*  MCV 89.2   < > 91.4   < > 90.1 90.9 91.8 92.8 97.7  PLT 236   < > 303   < > 238 268 229 251 90*   < > = values in this interval not displayed.   Basic Metabolic Panel: Recent Labs  Lab 05/27/19 0911  05/31/19 1028 06/01/19 0641  06/01/19 2122 06/02/19 0814 06/02/19 1845 06/02/19 2227 06/03/19 0301  NA 148*   < > 155* 156*   < > 154* 157* 157* 156* 156*  K 2.9*   < > 3.7 3.6   < > 3.9 3.9 4.1 4.1 4.2  CL 112*   < > 123* 123*   < > 123* 126* 124* 126* 124*  CO2 21*   < > 21* 22   < > 21* 21* 25 23 20*  GLUCOSE 200*   < > 227* 192*   < > 276* 174* 206* 160* 169*  BUN 27*   < > 92* 73*   < > 67* 56* 49* 48* 48*  CREATININE 1.17*   < > 2.39* 1.97*   < > 1.78* 1.76* 1.68* 1.60* 1.62*  CALCIUM 8.0*   < > 8.6* 8.5*   < > 8.3* 8.4* 8.5* 8.3* 8.2*  MG 1.8  --  2.6* 2.5*  --   --   --   --   --  2.3   < > = values in this interval not displayed.   GFR: Estimated Creatinine Clearance: 32.4 mL/min (A) (by C-G formula based on SCr of 1.62 mg/dL (H)). Liver Function Tests: Recent Labs    Lab 05/27/19 0911 05/29/19 1035 05/31/19 1028 06/01/19 0641 06/03/19 0301  AST 17 29 22 19 21   ALT 23 35 22 22 32  ALKPHOS 72 79 68 59 93  BILITOT 1.3* 0.9 0.6 0.7 0.8  PROT 6.0* 5.4* 5.7* 5.4* 4.9*  ALBUMIN 2.5* 2.1* 2.8* 2.7* 2.2*   Recent Labs  Lab  05/29/19 1035 06/03/19 0301  LIPASE 32 53*  AMYLASE  --  79   No results for input(s): AMMONIA in the last 168 hours. Coagulation Profile: Recent Labs  Lab 05/30/19 0500 05/31/19 0319 06/01/19 0641 06/02/19 0814 06/03/19 0301  INR 1.4* 1.4* 1.4* 1.4* 1.3*   Cardiac Enzymes: No results for input(s): CKTOTAL, CKMB, CKMBINDEX, TROPONINI in the last 168 hours. BNP (last 3 results) No results for input(s): PROBNP in the last 8760 hours. HbA1C: No results for input(s): HGBA1C in the last 72 hours. CBG: Recent Labs  Lab 06/02/19 1618 06/02/19 2003 06/02/19 2349 06/03/19 0407 06/03/19 0816  GLUCAP 146* 160* 138* 157* 158*   Lipid Profile: No results for input(s): CHOL, HDL, LDLCALC, TRIG, CHOLHDL, LDLDIRECT in the last 72 hours. Thyroid Function Tests: No results for input(s): TSH, T4TOTAL, FREET4, T3FREE, THYROIDAB in the last 72 hours. Anemia Panel: No results for input(s): VITAMINB12, FOLATE, FERRITIN, TIBC, IRON, RETICCTPCT in the last 72 hours. Urine analysis:    Component Value Date/Time   COLORURINE YELLOW 05/29/2019 1115   APPEARANCEUR CLOUDY (A) 05/29/2019 1115   LABSPEC 1.023 05/29/2019 1115   PHURINE 5.0 05/29/2019 1115   GLUCOSEU 150 (A) 05/29/2019 1115   HGBUR SMALL (A) 05/29/2019 1115   BILIRUBINUR NEGATIVE 05/29/2019 1115   BILIRUBINUR negative 11/20/2017 [place] 05/29/2019 1115   PROTEINUR NEGATIVE 05/29/2019 1115   UROBILINOGEN 0.2 11/20/2017 1447   UROBILINOGEN 1.0 11/30/2016 1856   NITRITE NEGATIVE 05/29/2019 1115   LEUKOCYTESUR NEGATIVE 05/29/2019 1115   Sepsis Labs: @LABRCNTIP (procalcitonin:4,lacticidven:4)  ) Recent Results (from the past 240 hour(s))  Culture,  blood (routine x 2)     Status: None (Preliminary result)   Collection Time: 05/29/19 10:26 AM   Specimen: BLOOD  Result Value Ref Range Status   Specimen Description BLOOD LEFT ANTECUBITAL  Final   Special Requests AEROBIC BOTTLE ONLY Blood Culture adequate volume  Final   Culture   Final    NO GROWTH 4 DAYS Performed at [facility], [place] [address] [place], [place] [address]    Report Status PENDING  Incomplete  Culture, blood (routine x 2)     Status: None (Preliminary result)   Collection Time: 05/29/19 10:35 AM   Specimen: BLOOD LEFT ARM  Result Value Ref Range Status   Specimen Description BLOOD LEFT ARM  Final   Special Requests AEROBIC BOTTLE ONLY Blood Culture adequate volume  Final   Culture   Final    NO GROWTH 4 DAYS Performed at [facility], [place] [address] [place], [place] [address]    Report Status PENDING  Incomplete  SARS Coronavirus 2 (CEPHEID - Performed in [place] hospital lab), Hosp Order     Status: None   Collection Time: 05/29/19 10:43 AM   Specimen: Nasopharyngeal Swab  Result Value Ref Range Status   SARS Coronavirus 2 NEGATIVE NEGATIVE Final    Comment: (NOTE) If result is NEGATIVE SARS-CoV-2 target nucleic acids are NOT DETECTED. The SARS-CoV-2 RNA is generally detectable in upper and lower  respiratory specimens during the acute phase of infection. The lowest  concentration of SARS-CoV-2 viral copies this assay can detect is 250  copies / mL. A negative result does not preclude SARS-CoV-2 infection  and should not be used as the sole basis for treatment or other  patient management decisions.  A negative result may occur with  improper specimen collection / handling, submission of specimen other  than nasopharyngeal swab, presence of  viral mutation(s) within the  areas targeted by this assay, and inadequate number of viral copies  (<250 copies / mL). A negative result must be combined with clinical  observations, patient history,  and epidemiological information. If result is POSITIVE SARS-CoV-2 target nucleic acids are DETECTED. The SARS-CoV-2 RNA is generally detectable in upper and lower  respiratory specimens dur ing the acute phase of infection.  Positive  results are indicative of active infection with SARS-CoV-2.  Clinical  correlation with patient history and other diagnostic information is  necessary to determine patient infection status.  Positive results do  not rule out bacterial infection or co-infection with other viruses. If result is PRESUMPTIVE POSTIVE SARS-CoV-2 nucleic acids MAY BE PRESENT.   A presumptive positive result was obtained on the submitted specimen  and confirmed on repeat testing.  While 2019 novel coronavirus  (SARS-CoV-2) nucleic acids may be present in the submitted sample  additional confirmatory testing may be necessary for epidemiological  and / or clinical management purposes  to differentiate between  SARS-CoV-2 and other Sarbecovirus currently known to infect humans.  If clinically indicated additional testing with an alternate test  methodology [phone]) is advised. The SARS-CoV-2 RNA is generally  detectable in upper and lower respiratory sp ecimens during the acute  phase of infection. The expected result is Negative. Fact Sheet for Patients:  StrictlyIdeas.no Fact Sheet for Healthcare Providers: BankingDealers.co.za This test is not yet approved or cleared by the [place] FDA and has been authorized for detection and/or diagnosis of SARS-CoV-2 by FDA under an Emergency Use Authorization (EUA).  This EUA will remain in effect (meaning this test can be used) for the duration of the COVID-19 declaration under Section 564(b)(1) of the Act, 21 U.S.C. section 360bbb-3(b)(1), unless the authorization is terminated or revoked sooner. Performed at [facility], [place] [address] [place], [place] [address]   Gram  stain     Status: None   Collection Time: 05/30/19  9:23 AM   Specimen: Pleural, Right; Pleural Fluid  Result Value Ref Range Status   Specimen Description PLEURAL RIGHT  Final   Special Requests NONE  Final   Gram Stain   Final    FEW WBC PRESENT,BOTH PMN AND MONONUCLEAR NO ORGANISMS SEEN Performed at [facility], [place] [address], [place], [place] [address]    Report Status 05/30/2019 FINAL  Final  Culture, body fluid-bottle     Status: None (Preliminary result)   Collection Time: 05/30/19  9:23 AM   Specimen: Pleura  Result Value Ref Range Status   Specimen Description PLEURAL RIGHT  Final   Special Requests NONE  Final   Culture   Final    NO GROWTH 3 DAYS Performed at [place] [address] [place], [place] [address]    Report Status PENDING  Incomplete  Culture, body fluid-bottle     Status: None (Preliminary result)   Collection Time: 05/31/19  2:02 PM   Specimen: Pleura  Result Value Ref Range Status   Specimen Description PLEURAL  Final   Special Requests FLUID  Final   Culture   Final    NO GROWTH 2 DAYS Performed at [facility], [address] [place],  [address]    Report Status PENDING  Incomplete  Gram stain     Status: None   Collection Time: 05/31/19  2:02 PM   Specimen: Pleura  Result Value Ref Range Status   Specimen Description PLEURAL  Final   Special Requests FLUID  Final   Gram Stain   Final    WBC PRESENT,BOTH PMN AND MONONUCLEAR NO ORGANISMS SEEN CYTOSPIN SMEAR Performed at [facility], [place] [address], [place], [place] [address]    Report Status 05/31/2019 FINAL  Final         Radiology Studies: Dg Chest Port 1 View  Result Date: 06/02/2019 CLINICAL DATA:  Pleural effusion ,sob EXAM: PORTABLE CHEST 1 VIEW COMPARISON:  Chest radiograph 06/01/2019 FINDINGS: Similar large cardiac silhouette. LEFT effusion and probable basilar atelectasis unchanged. Hiatal hernia noted behind the heart. There is  improvement in the patchy airspace disease seen in the RIGHT lung. IMPRESSION: 1. Improved airspace disease in the RIGHT lung. 2. Persistent LEFT effusion and basilar atelectasis. 3. Hiatal hernia Electronically Signed   By: Suzy Bouchard M.D.   On: 06/02/2019 08:46   Vas [place] Lower Extremity Venous (dvt)  Result Date: 06/01/2019  Lower Venous Study Indications: Swelling, and SOB.  Limitations: Body habitus and patient required to sit in upright position secondary to shortness of breath and hypoxia. Comparison Study: No prior study. Performing Technologist: Maudry Mayhew MHA, RDMS, RVT, RDCS  Examination Guidelines: A complete evaluation includes B-mode imaging, spectral Doppler, color Doppler, and power Doppler as needed of all accessible portions of each vessel. Bilateral testing is considered an integral part of a complete examination. Limited examinations for reoccurring indications may be performed as noted.  +---------+---------------+---------+-----------+----------+------------------+  RIGHT     Compressibility Phasicity Spontaneity Properties Summary             +---------+---------------+---------+-----------+----------+------------------+  CFV                                                        Not visualized      +---------+---------------+---------+-----------+----------+------------------+  SFJ                                                        Not visualized      +---------+---------------+---------+-----------+----------+------------------+  FV Prox                             Yes                    patent by color                                                                 Doppler             +---------+---------------+---------+-----------+----------+------------------+  FV Mid                              Yes                    patent by color  Doppler              +---------+---------------+---------+-----------+----------+------------------+  FV Distal                           Yes                    patent by color                                                                 Doppler             +---------+---------------+---------+-----------+----------+------------------+  PFV                                                        Not visualized      +---------+---------------+---------+-----------+----------+------------------+  POP       Full            Yes       Yes                                        +---------+---------------+---------+-----------+----------+------------------+  PTV       Full                      Yes                                        +---------+---------------+---------+-----------+----------+------------------+  PERO      Full                      Yes                                        +---------+---------------+---------+-----------+----------+------------------+ Unable to perform all compression maneuvers secondary to patient position.  +---------+---------------+---------+-----------+----------+------------------+  LEFT      Compressibility Phasicity Spontaneity Properties Summary             +---------+---------------+---------+-----------+----------+------------------+  CFV                                                        Not visualized      +---------+---------------+---------+-----------+----------+------------------+  SFJ                                                        Not visualized      +---------+---------------+---------+-----------+----------+------------------+  FV Prox  Yes                    patent by color                                                                 Doppler             +---------+---------------+---------+-----------+----------+------------------+  FV Mid                              Yes                    patent by color                                                                  Doppler             +---------+---------------+---------+-----------+----------+------------------+  FV Distal                           Yes                    patent by color                                                                 Doppler             +---------+---------------+---------+-----------+----------+------------------+  PFV                                                        Not visualized      +---------+---------------+---------+-----------+----------+------------------+  POP       Full            Yes       Yes                                        +---------+---------------+---------+-----------+----------+------------------+  PTV       Full                      Yes                                        +---------+---------------+---------+-----------+----------+------------------+  PERO      Full                      Yes                                        +---------+---------------+---------+-----------+----------+------------------+  Unable to perform all compression maneuvers secondary to patient position.    Summary: Right: There is no evidence of deep vein thrombosis in the lower extremity. However, portions of this examination were limited- see technologist comments above. No cystic structure found in the popliteal fossa. Left: There is no evidence of deep vein thrombosis in the lower extremity. However, portions of this examination were limited- see technologist comments above. No cystic structure found in the popliteal fossa.  *See table(s) above for measurements and observations. Electronically signed by Curt Jews MD on 06/01/2019 at 4:57:22 PM.    Final         Scheduled Meds:  feeding supplement (ENSURE ENLIVE)  237 mL Oral BID BM   insulin aspart  0-20 Units Subcutaneous Q4H   ipratropium-albuterol  3 mL Nebulization Q6H   metoprolol tartrate  25 mg Oral Q6H   pantoprazole (PROTONIX) IV  40 mg Intravenous Q12H   sodium chloride  flush  10-40 mL Intracatheter Q12H   Continuous Infusions:  diltiazem (CARDIZEM) infusion 10 mg/hr (06/03/19 0600)   meropenem (MERREM) IV 500 mg (06/02/19 2136)     LOS: 11 days   The patient is critically ill with multiple organ systems failure and requires high complexity decision making for assessment and support, frequent evaluation and titration of therapies, application of advanced monitoring technologies and extensive interpretation of multiple databases. Critical Care Time devoted to patient care services described in this note  Time spent: 40 minutes     Rylen Swindler, Geraldo Docker, MD [facility] Pager [phone]  If 7PM-7AM, please contact night-coverage [email or web address] Password [facility] 06/03/2019, 8:46 AM

## 2019-06-03 NOTE — Progress Notes (Signed)
Cortrak Tube Team Note:  Consult received to place a Cortrak feeding tube.   Multiple attempts were made to pass tube further than esophagus without succuess. Patient's oxygen saturation dropped to mid 70-80's with each attempt. Spoke with MD. Plan for placement in diagnostic radiology this afternoon.    Mariana Single RD, LDN Clinical Nutrition Pager # [phone]

## 2019-06-03 NOTE — Progress Notes (Signed)
Family member called. Requested daily/consistent communication from MD/Staff. Explained visitation policy. Explained efforts to communicate w/ identified specific family member is always our goal and hopeful that person will communicate w/other family members. She was appreciative of the conversation.

## 2019-06-03 NOTE — Progress Notes (Signed)
ANTICOAGULATION CONSULT NOTE - [facility] for IV heparin Indication: atrial fibrillation  Allergies  Allergen Reactions  . Niacin And Related Rash  . Keflex [Cephalexin] Other (See Comments)    Fatigue, sore throat, body aches  . Sausage [Pickled Meat] Other (See Comments)    Drowsiness and dizziness  . Tape Rash    Bandaids     Patient Measurements: Height: 5\' 4"  (162.6 cm) Weight: 207 lb 10.8 oz (94.2 kg) IBW/kg (Calculated) : 54.7 Heparin Dosing Weight: ~ 80 kg  Vital Signs: Temp: 98.8 F (37.1 C) (06/12 2030) Temp Source: Axillary (06/12 2030) BP: 102/73 (06/12 2030) Pulse Rate: 90 (06/12 2030)  Labs: Recent Labs    06/01/19 0641  06/02/19 0814 06/02/19 1845 06/02/19 2227 06/03/19 0301 06/03/19 2027  HGB 9.6*  --   --   --   --  7.9*  --   HCT 29.8*  --   --   --   --  25.2*  --   PLT 251  --   --   --   --  90*  --   LABPROT 16.6*  --  17.4*  --   --  16.3*  --   INR 1.4*  --  1.4*  --   --  1.3*  --   HEPARINUNFRC  --   --   --   --   --   --  0.20*  CREATININE 1.97*   < > 1.76* 1.68* 1.60* 1.62*  --    < > = values in this interval not displayed.    Estimated Creatinine Clearance: 32.4 mL/min (A) (by C-G formula based on SCr of 1.62 mg/dL (H)).   Medical History: Past Medical History:  Diagnosis Date  . Acute respiratory failure with hypoxia ([place]) 05/2019  . Allergic rhinitis   . Anticoagulated on Coumadin   . Arthritis   . Diabetes mellitus   . GERD (gastroesophageal reflux disease)   . Hyperlipidemia   . Hypertension   . Pain in joint, ankle and foot 09/14/2013    Medications:  Scheduled:  . feeding supplement (ENSURE ENLIVE)  237 mL Oral BID BM  . feeding supplement (PRO-STAT SUGAR FREE 64)  30 mL Per Tube BID BM  . insulin aspart  0-20 Units Subcutaneous Q4H  . ipratropium-albuterol  3 mL Nebulization Q6H  . methylPREDNISolone (SOLU-MEDROL) injection  40 mg Intravenous Q12H  . metoprolol tartrate  25 mg Oral  Q6H  . multivitamin with minerals  1 tablet Oral Daily  . pantoprazole (PROTONIX) IV  40 mg Intravenous Q12H  . sodium chloride flush  10-40 mL Intracatheter Q12H    Assessment: 78 yo female on chronic Coumadin for afib, currently on hold for suspicion of GIB.  Today's INR down to 1.3, no Coumadin given since prior to admission.  Pharmacy asked to start IV heparin while Coumadin on hold. Platelets noted 90 this morning (previously wnl). Initial heparin level subtherapeutic at 0.20, no S/Sx bleeding per RN.  Goal of Therapy:  Heparin level 0.3-0.7 units/ml Monitor platelets by anticoagulation protocol: Yes   Plan:  -Increase heparin to 1300 units/hr -Recheck heparin level with morning labs   Arrie Senate, PharmD, BCPS Clinical Pharmacist [phone] Please check AMION for all [place] numbers 06/03/2019

## 2019-06-03 NOTE — Progress Notes (Addendum)
ANTICOAGULATION CONSULT NOTE - Initial Consult  Pharmacy Consult for IV heparin Indication: atrial fibrillation  Allergies  Allergen Reactions  . Niacin And Related Rash  . Keflex [Cephalexin] Other (See Comments)    Fatigue, sore throat, body aches  . Sausage [Pickled Meat] Other (See Comments)    Drowsiness and dizziness  . Tape Rash    Bandaids     Patient Measurements: Height: 5\' 4"  (162.6 cm) Weight: 207 lb 10.8 oz (94.2 kg) IBW/kg (Calculated) : 54.7 Heparin Dosing Weight: ~ 80 kg  Vital Signs: Temp: 97.8 F (36.6 C) (06/12 0823) Temp Source: Oral (06/12 0823) BP: 106/83 (06/12 0934) Pulse Rate: 125 (06/12 0823)  Labs: Recent Labs    06/01/19 0641  06/02/19 0814 06/02/19 1845 06/02/19 2227 06/03/19 0301  HGB 9.6*  --   --   --   --  7.9*  HCT 29.8*  --   --   --   --  25.2*  PLT 251  --   --   --   --  90*  LABPROT 16.6*  --  17.4*  --   --  16.3*  INR 1.4*  --  1.4*  --   --  1.3*  CREATININE 1.97*   < > 1.76* 1.68* 1.60* 1.62*   < > = values in this interval not displayed.    Estimated Creatinine Clearance: 32.4 mL/min (A) (by C-G formula based on SCr of 1.62 mg/dL (H)).   Medical History: Past Medical History:  Diagnosis Date  . Acute respiratory failure with hypoxia ([place]) 05/2019  . Allergic rhinitis   . Anticoagulated on Coumadin   . Arthritis   . Diabetes mellitus   . GERD (gastroesophageal reflux disease)   . Hyperlipidemia   . Hypertension   . Pain in joint, ankle and foot 09/14/2013    Medications:  Scheduled:  . feeding supplement (ENSURE ENLIVE)  237 mL Oral BID BM  . feeding supplement (PRO-STAT SUGAR FREE 64)  30 mL Per Tube BID BM  . insulin aspart  0-20 Units Subcutaneous Q4H  . ipratropium-albuterol  3 mL Nebulization Q6H  . metoprolol tartrate  25 mg Oral Q6H  . multivitamin with minerals  1 tablet Oral Daily  . pantoprazole (PROTONIX) IV  40 mg Intravenous Q12H  . sodium chloride flush  10-40 mL Intracatheter Q12H     Assessment: 78 yo female on chronic Coumadin for afib, currently on hold for suspicion of GIB.  Today's INR down to 1.3, no Coumadin given since prior to admission.  Pharmacy asked to start IV heparin while Coumadin on hold.    Goal of Therapy:  Heparin level 0.3-0.7 units/ml Monitor platelets by anticoagulation protocol: Yes   Plan:  1. Start IV heparin gtt at rate of 1100 units/hr. 2. Check heparin level 8 hrs after gtt starts. 3. Daily heparin level and CBC. 4. Watch platelet count carefully. 5. F/u plans to resume Coumadin as able.  Marguerite Olea, [facility] Clinical Pharmacist Phone [phone]  06/03/2019 12:18 PM

## 2019-06-04 ENCOUNTER — Inpatient Hospital Stay (HOSPITAL_COMMUNITY): Payer: Medicare HMO

## 2019-06-04 DIAGNOSIS — K8000 Calculus of gallbladder with acute cholecystitis without obstruction: Secondary | ICD-10-CM

## 2019-06-04 DIAGNOSIS — I5031 Acute diastolic (congestive) heart failure: Secondary | ICD-10-CM

## 2019-06-04 DIAGNOSIS — I480 Paroxysmal atrial fibrillation: Secondary | ICD-10-CM

## 2019-06-04 DIAGNOSIS — I4819 Other persistent atrial fibrillation: Secondary | ICD-10-CM

## 2019-06-04 LAB — COMPREHENSIVE METABOLIC PANEL
ALT: 25 U/L (ref 0–44)
AST: 24 U/L (ref 15–41)
Albumin: 2.8 g/dL — ABNORMAL LOW (ref 3.5–5.0)
Alkaline Phosphatase: 75 U/L (ref 38–126)
Anion gap: 11 (ref 5–15)
BUN: 46 mg/dL — ABNORMAL HIGH (ref 8–23)
CO2: 18 mmol/L — ABNORMAL LOW (ref 22–32)
Calcium: 8.4 mg/dL — ABNORMAL LOW (ref 8.9–10.3)
Chloride: 125 mmol/L — ABNORMAL HIGH (ref 98–111)
Creatinine, Ser: 1.55 mg/dL — ABNORMAL HIGH (ref 0.44–1.00)
GFR calc Af Amer: 37 mL/min — ABNORMAL LOW (ref >60)
GFR calc non Af Amer: 32 mL/min — ABNORMAL LOW (ref >60)
Glucose, Bld: 237 mg/dL — ABNORMAL HIGH (ref 70–99)
Potassium: 4.6 mmol/L (ref 3.5–5.1)
Sodium: 154 mmol/L — ABNORMAL HIGH (ref 135–145)
Total Bilirubin: 1.5 mg/dL — ABNORMAL HIGH (ref 0.3–1.2)
Total Protein: 5.3 g/dL — ABNORMAL LOW (ref 6.5–8.1)

## 2019-06-04 LAB — BLOOD GAS, ARTERIAL
Acid-base deficit: 0.5 mmol/L (ref 0.0–2.0)
Bicarbonate: 22.8 mmol/L (ref 20.0–28.0)
Drawn by: 34791
FIO2: 60
O2 Content: 25 L/min
O2 Saturation: 91.7 %
Patient temperature: 98.3
pCO2 arterial: 31.7 mmHg — ABNORMAL LOW (ref 32.0–48.0)
pH, Arterial: 7.47 — ABNORMAL HIGH (ref 7.350–7.450)
pO2, Arterial: 63.8 mmHg — ABNORMAL LOW (ref 83.0–108.0)

## 2019-06-04 LAB — GLUCOSE, CAPILLARY
Glucose-Capillary: 149 mg/dL — ABNORMAL HIGH (ref 70–99)
Glucose-Capillary: 192 mg/dL — ABNORMAL HIGH (ref 70–99)
Glucose-Capillary: 228 mg/dL — ABNORMAL HIGH (ref 70–99)
Glucose-Capillary: 253 mg/dL — ABNORMAL HIGH (ref 70–99)
Glucose-Capillary: 275 mg/dL — ABNORMAL HIGH (ref 70–99)
Glucose-Capillary: 287 mg/dL — ABNORMAL HIGH (ref 70–99)

## 2019-06-04 LAB — CBC
HCT: 27.3 % — ABNORMAL LOW (ref 36.0–46.0)
Hemoglobin: 8.6 g/dL — ABNORMAL LOW (ref 12.0–15.0)
MCH: 30.2 pg (ref 26.0–34.0)
MCHC: 31.5 g/dL (ref 30.0–36.0)
MCV: 95.8 fL (ref 80.0–100.0)
Platelets: 168 10*3/uL (ref 150–400)
RBC: 2.85 MIL/uL — ABNORMAL LOW (ref 3.87–5.11)
RDW: 16.6 % — ABNORMAL HIGH (ref 11.5–15.5)
WBC: 16.7 10*3/uL — ABNORMAL HIGH (ref 4.0–10.5)
nRBC: 0.8 % — ABNORMAL HIGH (ref 0.0–0.2)

## 2019-06-04 LAB — CULTURE, BODY FLUID W GRAM STAIN -BOTTLE: Culture: NO GROWTH

## 2019-06-04 LAB — HEPARIN LEVEL (UNFRACTIONATED)
Heparin Unfractionated: 0.54 IU/mL (ref 0.30–0.70)
Heparin Unfractionated: 0.68 IU/mL (ref 0.30–0.70)

## 2019-06-04 LAB — AMMONIA: Ammonia: 33 umol/L (ref 9–35)

## 2019-06-04 LAB — PROTIME-INR
INR: 1.7 — ABNORMAL HIGH (ref 0.8–1.2)
Prothrombin Time: 19.8 seconds — ABNORMAL HIGH (ref 11.4–15.2)

## 2019-06-04 LAB — MAGNESIUM: Magnesium: 2.5 mg/dL — ABNORMAL HIGH (ref 1.7–2.4)

## 2019-06-04 MED ORDER — INSULIN GLARGINE 100 UNIT/ML ~~LOC~~ SOLN
10.0000 [IU] | Freq: Every day | SUBCUTANEOUS | Status: DC
  Administered 2019-06-04 – 2019-06-11 (×8): 10 [IU] via SUBCUTANEOUS
  Filled 2019-06-04 (×9): qty 0.1

## 2019-06-04 MED ORDER — ALBUMIN HUMAN 25 % IV SOLN
50.0000 g | Freq: Once | INTRAVENOUS | Status: AC
  Administered 2019-06-04: 50 g via INTRAVENOUS
  Filled 2019-06-04: qty 200

## 2019-06-04 MED ORDER — LIDOCAINE VISCOUS HCL 2 % MT SOLN
OROMUCOSAL | Status: AC
  Filled 2019-06-04: qty 15

## 2019-06-04 MED ORDER — IPRATROPIUM-ALBUTEROL 0.5-2.5 (3) MG/3ML IN SOLN
3.0000 mL | Freq: Three times a day (TID) | RESPIRATORY_TRACT | Status: DC
  Administered 2019-06-04 – 2019-06-13 (×26): 3 mL via RESPIRATORY_TRACT
  Filled 2019-06-04 (×27): qty 3

## 2019-06-04 NOTE — Progress Notes (Signed)
Pt went to [facility] for Cortrex tube placement with face mask non-rebreather bag 25 LPM and a staff nurse, [place] RN, accompany. Pt tolerated well while transfering. Vital remained stable, no acute distress seen. She was back to the unit around 11 am with Tie RN. Continued  25 LPM with HFNCL. No acute distress seen.Continue to monitor.  Kennyth Lose, BSN,RN,PCCN-CMC,CSC

## 2019-06-04 NOTE — Plan of Care (Signed)
Poc progressing.  

## 2019-06-04 NOTE — Progress Notes (Signed)
Metoprolol will not give at this time due to BP 90s/50s mmHg, Atrila fib on monitor. HR under controlled. Will continue to monitor.  Kennyth Lose, RN

## 2019-06-04 NOTE — Progress Notes (Signed)
Called Pt's daughter for information update this morning. All questions answered about plan of care. She had fully understanding. Will continue to monitor.   Kennyth Lose, BSN,RN,PCCN-CMC,CSC

## 2019-06-04 NOTE — Progress Notes (Signed)
RT placed pt on BIPAP for the night d/t increased WOB. Pt settings 20/12, FIO2 70%, BUR 12. Pt tolerating well. Pt respiratory status otherwise stable at this time. RT able to CPT without issues, instructed pt to huff cough and expectorate mucus and not to swallow. Pt able to do so when instructed. RT will continue to monitor.

## 2019-06-04 NOTE — Progress Notes (Signed)
Progress Note   Subjective   Ill.  + abdominal pain.  Moans but does not provide further history.  Inpatient Medications    Scheduled Meds: . feeding supplement (ENSURE ENLIVE)  237 mL Oral BID BM  . feeding supplement (PRO-STAT SUGAR FREE 64)  30 mL Per Tube BID BM  . insulin aspart  0-20 Units Subcutaneous Q4H  . ipratropium-albuterol  3 mL Nebulization TID  . methylPREDNISolone (SOLU-MEDROL) injection  40 mg Intravenous Q12H  . metoprolol tartrate  25 mg Oral Q6H  . multivitamin with minerals  1 tablet Oral Daily  . pantoprazole (PROTONIX) IV  40 mg Intravenous Q12H  . sodium chloride flush  10-40 mL Intracatheter Q12H   Continuous Infusions: . dextrose 40 mL/hr at 06/04/19 0019  . diltiazem (CARDIZEM) infusion 10 mg/hr (06/04/19 0435)  . feeding supplement (OSMOLITE 1.5 CAL)    . heparin 1,300 Units/hr (06/03/19 2128)  . meropenem (MERREM) IV 500 mg (06/03/19 2258)   PRN Meds: acetaminophen **OR** acetaminophen, HYDROmorphone (DILAUDID) injection, lidocaine (PF), LORazepam, metoprolol tartrate, ondansetron **OR** ondansetron (ZOFRAN) IV, sodium chloride flush   Vital Signs    Vitals:   06/04/19 0500 06/04/19 0758 06/04/19 0802 06/04/19 0805  BP: 113/88 119/81    Pulse: 80 68  92  Resp: (!) 24 (!) 30  (!) 28  Temp: 98.3 F (36.8 C) (!) 97.5 F (36.4 C)    TempSrc: Axillary Oral    SpO2: 94% 97% 97% 99%  Weight: 95.7 kg     Height:        Intake/Output Summary (Last 24 hours) at 06/04/2019 0835 Last data filed at 06/04/2019 0962 Gross per 24 hour  Intake 1262.63 ml  Output 400 ml  Net 862.63 ml   Filed Weights   06/02/19 0300 06/03/19 0500 06/04/19 0500  Weight: 94 kg 94.2 kg 95.7 kg    Telemetry    Afib, V rates stable - Personally Reviewed  Physical Exam   GEN- The patient is ill appearing,  moans Head- normocephalic, atraumatic Eyes-  Sclera clear, conjunctiva pink Ears- hearing intact Oropharynx- clear Neck- supple, Lungs-  normal work of  breathing Heart- irregular rate and rhythm  GI- soft  Extremities- no clubbing, cyanosis,+ dependant edema  MS- diffuse atrophy Psych- moans only Neuro- strength and sensation are intact   Labs    Chemistry Recent Labs  Lab 05/31/19 1028 06/01/19 0641  06/02/19 1845 06/02/19 2227 06/03/19 0301  NA 155* 156*   < > 157* 156* 156*  K 3.7 3.6   < > 4.1 4.1 4.2  CL 123* 123*   < > 124* 126* 124*  CO2 21* 22   < > 25 23 20*  GLUCOSE 227* 192*   < > 206* 160* 169*  BUN 92* 73*   < > 49* 48* 48*  CREATININE 2.39* 1.97*   < > 1.68* 1.60* 1.62*  CALCIUM 8.6* 8.5*   < > 8.5* 8.3* 8.2*  PROT 5.7* 5.4*  --   --   --  4.9*  ALBUMIN 2.8* 2.7*  --   --   --  2.2*  AST 22 19  --   --   --  21  ALT 22 22  --   --   --  32  ALKPHOS 68 59  --   --   --  93  BILITOT 0.6 0.7  --   --   --  0.8  GFRNONAA 19* 24*   < > 29*  31* 30*  GFRAA 22* 28*   < > 34* 36* 35*  ANIONGAP 11 11   < > 8 7 12    < > = values in this interval not displayed.     Hematology Recent Labs  Lab 06/01/19 0641 06/03/19 0301 06/04/19 0313  WBC 27.0* 19.1* 16.7*  RBC 3.21* 2.58* 2.85*  HGB 9.6* 7.9* 8.6*  HCT 29.8* 25.2* 27.3*  MCV 92.8 97.7 95.8  MCH 29.9 30.6 30.2  MCHC 32.2 31.3 31.5  RDW 15.7* 16.8* 16.6*  PLT 251 90* 168    Cardiac EnzymesNo results for input(s): TROPONINI in the last 168 hours. No results for input(s): TROPIPOC in the last 168 hours.     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  afib V rates are stable Off anticoagulation currently due to anemia Treat underlying illness and V rates should improve  2. Acute diastolic dysfunction Complicated by medical illness and acure renal failure/ pancreatitis Caution with IVF  Clinically stable from a CV standpoint Cardiology to  see again on Monday unless problems arise  Thompson Grayer MD, [facility] 06/04/2019 8:35 AM

## 2019-06-04 NOTE — Progress Notes (Signed)
PROGRESS NOTE    Melanie [name]  HCW:[id number] DOB: 09-16-1941 DOA: 05/22/2019 PCP: [name], MD   Brief Narrative:  78 year old WF PMHx dementia, diabetes type 2 uncontrolled with complication, DM neuropathy, HLD, essential HTN, A. fib on warfarin, dysphagia, cholelithiasis, colon polyps,  Admitted on 05/22/2019, presented with complaint of epigastric abdominal pain nausea and vomiting, was found to have acute pancreatitis as well as sepsis of unknown origin. Currently further plan is continue current treatment.    Subjective: 6/13 A/O x3 (does not know why), follows some commands, constantly moans in pain.    Assessment & Plan:   Principal Problem:   Acute pancreatitis Active Problems:   HTN (hypertension)   DM (diabetes mellitus) type II controlled, neurological manifestation (HCC)   Afib (HCC)   SOB (shortness of breath)   Abdominal discomfort   Acute respiratory failure with hypoxia (HCC)   Cholelithiasis   Sepsis unknown etiology - Intra-abdominal source Vs CAP vs aspiration pneumonia -Negative cultures on admission - Patiently treated with IV vancomycin and Zosyn--> IV Unasyn - 6/6 CXR moderate bilateral pleural effusion with consolidation -ABG shows chronic compensated respiratory alkalosis along with metabolic acidosis and high anion gap - Patient with continued elevated leukocytosis however improving, since we do not have definitive source of infection and patient severely ill will continue antibiotic for now.  Have completed 7 days of meropenem.  Continue meropenem.    Acute gallstone pancreatitis -Per GI note 6/9 patient will require surgical referral/consultation to consider a cholecystectomy prior to discharge as result of previously felt pancreatitis due to likely biliary/gallstone disease that occurred upon her initial admission.  Once patient more stable. - MRCP negative for CBD dilation or CBD stone  Acute on CKD stage III (baseline Cr 1.13  ) -Multifactorial sepsis, diabetes type 2 uncontrolled, overdiuresis.  BUN remains elevated (GI bleed?) - Patient remains intravascularly depleted secondary to hypoalbuminemia. - 6/12 Albumin 50 g - Continue gentle hydration D5W 26ml/hr unable to place core track tube or NG tube by IR  Recent Labs  Lab 06/02/19 0814 06/02/19 1845 06/02/19 2227 06/03/19 0301 06/04/19 0746  CREATININE 1.76* 1.68* 1.60* 1.62* 1.55*  -Creatinine continues to mildly improved.   Acute respiratory failure with hypoxia -Multifactorial Bilateral Pleural Effusion, CAP vs atelectasis vs mucous plugging - Mild improvement, remains on HFNC 25 L/min: FiO2 60% - PCXR 6/13 pending -ABG 6/13 pending  Bilateral pleural effusion -6/8 s/p RIGHT thoracentesis/6/9 s/p LEFT thoracentesis see below; work-up showed transudate of effusion -6/13 PCXR shows enlarging LEFT pleural effusion - 6/13 consult IR for thoracentesis   CAP vs aspiration pneumonia vs atelectasis vs mucous plugging - Patient unable to participate with flutter valve - Physiotherapy vest - Solu-Medrol 40 mg twice daily -DuoNeb every 6 hours - Patient had received hypertonic nebulization, may restart in the a.m. dependent upon findings of PCXR -BiPAP held secondary to drop in BP  Acute on chronic diastolic CHF - Cardizem drip - Metoprolol IV PRN - Metoprolol 25 mg every 6 -Strict in and out +6.6 L -Daily weight Filed Weights   05/31/19 0438 06/02/19 0300 06/03/19 0500  Weight: 94.3 kg 94 kg 94.2 kg  . Chronic atrial fibrillation with RVR -We will start patient on heparin drip today per GI recommendation from 6/9.  Monitor for any bleeding.  GI bleed? - Multifactorial, secondary to supratherapeutic INR, hemorrhagic. - Occult blood pending - Trend hemoglobin Recent Labs  Lab 05/30/19 1700 05/31/19 0319 06/01/19 0641 06/03/19 0301 06/04/19 0313  HGB 9.4*  8.8* 9.6* 7.9* 8.6*  -Stable - Trend INR Recent Labs  Lab 05/31/19 0319  06/01/19 0641 06/02/19 0814 06/03/19 0301 06/04/19 0313  INR 1.4* 1.4* 1.4* 1.3* 1.7*  - Protonix 40 mg twice daily  supratherapeutic INR -Resolved   Acute metabolic encephalopathy/dementia/CVA? - CT head unremarkable, and EEG unremarkable.  However patient has patent foramen ovale by echocardiogram with bubble study. - If within the next 24 to 48 hours all metabolic abnormalities have been corrected and still no improvement in patient's cognition will obtain MRI   Diabetes type 2 uncontrolled with complication -3/5 hemoglobin A1c = 7.5  -6/13 start Lantus 10 units nightly - Resistant SSI  Dysphagia - 6/12 core track team has attempted to place core track multiple attempts unsuccessfully - 6/13 IR attempted to place NG tube multiple attempts unsuccessfully: Discussed with Dr. Oletta Lamas IR what the difficulty was not passing tube he believes the esophagus inflamed/swollen/strictures.  Recommends we reconsult GI and asked them to try to pass tube endoscopically. - [facility] Dr. Valarie Merino Mansouraty last saw patient on 6/9 will reconsult in a.m. to discuss endoscopic placement of NG tube as patient without adequate nutrition or hydration.   Obesity Body mass index is 35.68 kg/m.  Continue to monitor for now.   Hypokalemia - Potassium goal> 4   Hypernatremia - Multifactorial poor p.o. intake, dehydration -Patient intravascularly dehydrated secondary to hypoalbuminemia - Slowly correcting with D5W  Hypoalbuminemia - Albumin 50 g    DVT prophylaxis: SCD Code Status: Full Family Communication: None Disposition Plan: TBD   Consultants:  [facility] Neurology    Procedures/Significant Events:  6/4 EEG: Abnormal EEG due to the presence of general background slowing and triphasic waves.  This is seen most commonly in encephalopathic states, most commonly, but not limited to, metabolic encephalopathies 6/5 echocardiogram:-The left ventricle has hyperdynamic  systolic function, with an ejection fraction of >65%.-The cavity size was decreased.  -Left Atrium: moderate to severely enlarged . -Right Atrium: mildly dilated. pressure is estimated at 10 mmHg. 6/8 RIGHT thoracentesis.: 1.2 L aspirated 6/9 LEFT thoracentesis; 612ml 6/11 PCXR:-Improved airspace disease in the RIGHT lung. - Persistent LEFT effusion and basilar atelectasis. -Hiatal hernia 6/12 echocardiogram with bubble study: Abnormal bubble contrast study.  Interstitial shunt noted suggestive of patent foramen ovale -LVEF 55 - 60% 6/13 PCXR: Lucency left lower mediastinum corresponds to known hiatal hernia -Moderately large LEFT pleural effusion has increased in size with worsening atelectasis or consolidation in the left lung -Mild patchy airspace opacity persists right lung    I have personally reviewed and interpreted all radiology studies and my findings are as above.  VENTILATOR SETTINGS:    Cultures   Antimicrobials: Anti-infectives (From admission, onward)   Start     Ordered Stop   05/29/19 1000  meropenem (MERREM) 500 mg in sodium chloride 0.9 % 100 mL IVPB     05/29/19 0805     05/28/19 1700  Ampicillin-Sulbactam (UNASYN) 3 g in sodium chloride 0.9 % 100 mL IVPB  Status:  Discontinued     05/28/19 0735 05/29/19 0805   05/27/19 1200  Ampicillin-Sulbactam (UNASYN) 3 g in sodium chloride 0.9 % 100 mL IVPB  Status:  Discontinued     05/27/19 1044 05/28/19 0735   05/23/19 1600  piperacillin-tazobactam (ZOSYN) IVPB 3.375 g  Status:  Discontinued     05/23/19 0933 05/27/19 1044   05/23/19 0800  piperacillin-tazobactam (ZOSYN) IVPB 3.375 g     05/23/19 0141 05/23/19 0911   05/23/19  0100  piperacillin-tazobactam (ZOSYN) IVPB 3.375 g     05/23/19 0059 05/23/19 0241       Devices    LINES / TUBES:      Continuous Infusions: . dextrose 40 mL/hr at 06/04/19 0019  . diltiazem (CARDIZEM) infusion 10 mg/hr (06/04/19 0435)  . feeding supplement (OSMOLITE 1.5 CAL)     . heparin 1,300 Units/hr (06/03/19 2128)  . meropenem (MERREM) IV 500 mg (06/03/19 2258)     Objective: Vitals:   06/04/19 0200 06/04/19 0246 06/04/19 0300 06/04/19 0500  BP: 103/65  97/81 113/88  Pulse:   87 80  Resp: (!) 29  (!) 31 (!) 24  Temp:    98.3 F (36.8 C)  TempSrc:    Axillary  SpO2:  95% 92% 94%  Weight:      Height:        Intake/Output Summary (Last 24 hours) at 06/04/2019 0718 Last data filed at 06/04/2019 7062 Gross per 24 hour  Intake 1622.63 ml  Output 400 ml  Net 1222.63 ml   Filed Weights   05/31/19 0438 06/02/19 0300 06/03/19 0500  Weight: 94.3 kg 94 kg 94.2 kg    Physical Exam:  General: A/O x3 (does not know why) positive Acute respiratory distress Eyes: negative scleral hemorrhage, negative anisocoria, negative icterus ENT: Negative Runny nose, negative gingival bleeding, Neck:  Negative scars, masses, torticollis, lymphadenopathy, JVD Lungs: Tachypneic, decreased breath sounds diffusely but especially LLL, negative wheezes or crackles Cardiovascular: Regular rate and rhythm without murmur gallop or rub normal S1 and S2 Abdomen: Obese, positive abdominal pain to palpation, nondistended, positive soft, bowel sounds, no rebound, no ascites, no appreciable mass Extremities: No significant cyanosis, clubbing, or edema bilateral lower extremities Skin: Negative rashes, lesions, ulcers Psychiatric:  Negative depression, negative anxiety, negative fatigue, negative mania  Central nervous system:  Cranial nerves II through XII intact, tongue/uvula midline, all extremities muscle strength 5/5, sensation intact throughout, negative dysarthria, negative expressive aphasia, negative receptive aphasia.      Data Reviewed: Care during the described time interval was provided by me .  I have reviewed this patient's available data, including medical history, events of note, physical examination, and all test results as part of my evaluation.   CBC: Recent  Labs  Lab 05/29/19 1035  05/30/19 1700 05/31/19 0319 06/01/19 0641 06/03/19 0301 06/04/19 0313  WBC 31.0*   < > 36.7* 29.4* 27.0* 19.1* 16.7*  NEUTROABS 27.2*  --   --   --  24.9*  --   --   HGB 11.1*   < > 9.4* 8.8* 9.6* 7.9* 8.6*  HCT 33.9*   < > 28.1* 26.7* 29.8* 25.2* 27.3*  MCV 91.4   < > 90.9 91.8 92.8 97.7 95.8  PLT 303   < > 268 229 251 90* 168   < > = values in this interval not displayed.   Basic Metabolic Panel: Recent Labs  Lab 05/31/19 1028 06/01/19 0641  06/01/19 2122 06/02/19 0814 06/02/19 1845 06/02/19 2227 06/03/19 0301 06/04/19 0313  NA 155* 156*   < > 154* 157* 157* 156* 156*  --   K 3.7 3.6   < > 3.9 3.9 4.1 4.1 4.2  --   CL 123* 123*   < > 123* 126* 124* 126* 124*  --   CO2 21* 22   < > 21* 21* 25 23 20*  --   GLUCOSE 227* 192*   < > 276* 174* 206* 160* 169*  --  BUN 92* 73*   < > 67* 56* 49* 48* 48*  --   CREATININE 2.39* 1.97*   < > 1.78* 1.76* 1.68* 1.60* 1.62*  --   CALCIUM 8.6* 8.5*   < > 8.3* 8.4* 8.5* 8.3* 8.2*  --   MG 2.6* 2.5*  --   --   --   --   --  2.3 2.5*   < > = values in this interval not displayed.   GFR: Estimated Creatinine Clearance: 32.4 mL/min (A) (by C-G formula based on SCr of 1.62 mg/dL (H)). Liver Function Tests: Recent Labs  Lab 05/29/19 1035 05/31/19 1028 06/01/19 0641 06/03/19 0301  AST 29 22 19 21   ALT 35 22 22 32  ALKPHOS 79 68 59 93  BILITOT 0.9 0.6 0.7 0.8  PROT 5.4* 5.7* 5.4* 4.9*  ALBUMIN 2.1* 2.8* 2.7* 2.2*   Recent Labs  Lab 05/29/19 1035 06/03/19 0301  LIPASE 32 53*  AMYLASE  --  79   Recent Labs  Lab 06/04/19 0313  AMMONIA 33   Coagulation Profile: Recent Labs  Lab 05/31/19 0319 06/01/19 0641 06/02/19 0814 06/03/19 0301 06/04/19 0313  INR 1.4* 1.4* 1.4* 1.3* 1.7*   Cardiac Enzymes: No results for input(s): CKTOTAL, CKMB, CKMBINDEX, TROPONINI in the last 168 hours. BNP (last 3 results) No results for input(s): PROBNP in the last 8760 hours. HbA1C: No results for input(s):  HGBA1C in the last 72 hours. CBG: Recent Labs  Lab 06/03/19 1127 06/03/19 1623 06/03/19 2038 06/04/19 0011 06/04/19 0519  GLUCAP 202* 163* 118* 149* 192*   Lipid Profile: No results for input(s): CHOL, HDL, LDLCALC, TRIG, CHOLHDL, LDLDIRECT in the last 72 hours. Thyroid Function Tests: No results for input(s): TSH, T4TOTAL, FREET4, T3FREE, THYROIDAB in the last 72 hours. Anemia Panel: No results for input(s): VITAMINB12, FOLATE, FERRITIN, TIBC, IRON, RETICCTPCT in the last 72 hours. Urine analysis:    Component Value Date/Time   COLORURINE YELLOW 05/29/2019 1115   APPEARANCEUR CLOUDY (A) 05/29/2019 1115   LABSPEC 1.023 05/29/2019 1115   PHURINE 5.0 05/29/2019 1115   GLUCOSEU 150 (A) 05/29/2019 1115   HGBUR SMALL (A) 05/29/2019 1115   BILIRUBINUR NEGATIVE 05/29/2019 1115   BILIRUBINUR negative 11/20/2017 [place] 05/29/2019 1115   PROTEINUR NEGATIVE 05/29/2019 1115   UROBILINOGEN 0.2 11/20/2017 1447   UROBILINOGEN 1.0 11/30/2016 1856   NITRITE NEGATIVE 05/29/2019 1115   LEUKOCYTESUR NEGATIVE 05/29/2019 1115   Sepsis Labs: @LABRCNTIP (procalcitonin:4,lacticidven:4)  ) Recent Results (from the past 240 hour(s))  Culture, blood (routine x 2)     Status: None   Collection Time: 05/29/19 10:26 AM   Specimen: BLOOD  Result Value Ref Range Status   Specimen Description BLOOD LEFT ANTECUBITAL  Final   Special Requests AEROBIC BOTTLE ONLY Blood Culture adequate volume  Final   Culture   Final    NO GROWTH 5 DAYS Performed at [facility], [place] [address], [place], [place] [address]    Report Status 06/03/2019 FINAL  Final  Culture, blood (routine x 2)     Status: None   Collection Time: 05/29/19 10:35 AM   Specimen: BLOOD LEFT ARM  Result Value Ref Range Status   Specimen Description BLOOD LEFT ARM  Final   Special Requests AEROBIC BOTTLE ONLY Blood Culture adequate volume  Final   Culture   Final    NO GROWTH 5 DAYS Performed at [facility], [place] [address] [place], [place] [address]  Report Status 06/03/2019 FINAL  Final  SARS Coronavirus 2 (CEPHEID - Performed in [facility] hospital lab), Hosp Order     Status: None   Collection Time: 05/29/19 10:43 AM   Specimen: Nasopharyngeal Swab  Result Value Ref Range Status   SARS Coronavirus 2 NEGATIVE NEGATIVE Final    Comment: (NOTE) If result is NEGATIVE SARS-CoV-2 target nucleic acids are NOT DETECTED. The SARS-CoV-2 RNA is generally detectable in upper and lower  respiratory specimens during the acute phase of infection. The lowest  concentration of SARS-CoV-2 viral copies this assay can detect is 250  copies / mL. A negative result does not preclude SARS-CoV-2 infection  and should not be used as the sole basis for treatment or other  patient management decisions.  A negative result may occur with  improper specimen collection / handling, submission of specimen other  than nasopharyngeal swab, presence of viral mutation(s) within the  areas targeted by this assay, and inadequate number of viral copies  (<250 copies / mL). A negative result must be combined with clinical  observations, patient history, and epidemiological information. If result is POSITIVE SARS-CoV-2 target nucleic acids are DETECTED. The SARS-CoV-2 RNA is generally detectable in upper and lower  respiratory specimens dur ing the acute phase of infection.  Positive  results are indicative of active infection with SARS-CoV-2.  Clinical  correlation with patient history and other diagnostic information is  necessary to determine patient infection status.  Positive results do  not rule out bacterial infection or co-infection with other viruses. If result is PRESUMPTIVE POSTIVE SARS-CoV-2 nucleic acids MAY BE PRESENT.   A presumptive positive result was obtained on the submitted specimen  and confirmed on repeat testing.  While 2019 novel coronavirus  (SARS-CoV-2) nucleic acids may be present in  the submitted sample  additional confirmatory testing may be necessary for epidemiological  and / or clinical management purposes  to differentiate between  SARS-CoV-2 and other Sarbecovirus currently known to infect humans.  If clinically indicated additional testing with an alternate test  methodology [phone]) is advised. The SARS-CoV-2 RNA is generally  detectable in upper and lower respiratory sp ecimens during the acute  phase of infection. The expected result is Negative. Fact Sheet for Patients:  StrictlyIdeas.no Fact Sheet for Healthcare Providers: BankingDealers.co.za This test is not yet approved or cleared by the [place] FDA and has been authorized for detection and/or diagnosis of SARS-CoV-2 by FDA under an Emergency Use Authorization (EUA).  This EUA will remain in effect (meaning this test can be used) for the duration of the COVID-19 declaration under Section 564(b)(1) of the Act, 21 U.S.C. section 360bbb-3(b)(1), unless the authorization is terminated or revoked sooner. Performed at [facility], [place] [address], [place], [place] [address]   Gram stain     Status: None   Collection Time: 05/30/19  9:23 AM   Specimen: Pleural, Right; Pleural Fluid  Result Value Ref Range Status   Specimen Description PLEURAL RIGHT  Final   Special Requests NONE  Final   Gram Stain   Final    FEW WBC PRESENT,BOTH PMN AND MONONUCLEAR NO ORGANISMS SEEN Performed at [facility], [place] [address] [place], [place] [address]    Report Status 05/30/2019 FINAL  Final  Culture, body fluid-bottle     Status: None (Preliminary result)   Collection Time: 05/30/19  9:23 AM   Specimen: Pleura  Result Value Ref Range Status   Specimen Description PLEURAL RIGHT  Final   Special Requests NONE  Final   Culture   Final    NO GROWTH 4 DAYS Performed at [facility], [place] [address], [place], [place] [address]    Report Status  PENDING  Incomplete  Culture, body fluid-bottle     Status: None (Preliminary result)   Collection Time: 05/31/19  2:02 PM   Specimen: Pleura  Result Value Ref Range Status   Specimen Description PLEURAL  Final   Special Requests FLUID  Final   Culture   Final    NO GROWTH 3 DAYS Performed at [facility], [address] [place], [place] [address]    Report Status PENDING  Incomplete  Gram stain     Status: None   Collection Time: 05/31/19  2:02 PM   Specimen: Pleura  Result Value Ref Range Status   Specimen Description PLEURAL  Final   Special Requests FLUID  Final   Gram Stain   Final    WBC PRESENT,BOTH PMN AND MONONUCLEAR NO ORGANISMS SEEN CYTOSPIN SMEAR Performed at [facility], [address], [place], [place] [address]    Report Status 05/31/2019 FINAL  Final  Respiratory Panel by PCR     Status: None   Collection Time: 06/03/19 10:07 AM   Specimen: Nasopharyngeal Swab; Respiratory  Result Value Ref Range Status   Adenovirus NOT DETECTED NOT DETECTED Final   Coronavirus 229E NOT DETECTED NOT DETECTED Final    Comment: (NOTE) The Coronavirus on the Respiratory Panel, DOES NOT test for the novel  Coronavirus (2019 nCoV)    Coronavirus HKU1 NOT DETECTED NOT DETECTED Final   Coronavirus NL63 NOT DETECTED NOT DETECTED Final   Coronavirus OC43 NOT DETECTED NOT DETECTED Final   Metapneumovirus NOT DETECTED NOT DETECTED Final   Rhinovirus / Enterovirus NOT DETECTED NOT DETECTED Final   Influenza A NOT DETECTED NOT DETECTED Final   Influenza B NOT DETECTED NOT DETECTED Final   Parainfluenza Virus 1 NOT DETECTED NOT DETECTED Final   Parainfluenza Virus 2 NOT DETECTED NOT DETECTED Final   Parainfluenza Virus 3 NOT DETECTED NOT DETECTED Final   Parainfluenza Virus 4 NOT DETECTED NOT DETECTED Final   Respiratory Syncytial Virus NOT DETECTED NOT DETECTED Final   Bordetella pertussis NOT DETECTED NOT DETECTED Final   Chlamydophila pneumoniae NOT DETECTED NOT  DETECTED Final   Mycoplasma pneumoniae NOT DETECTED NOT DETECTED Final    Comment: Performed at [facility], [place]. [address], [place], [place] [address]         Radiology Studies: No results found.      Scheduled Meds: . feeding supplement (ENSURE ENLIVE)  237 mL Oral BID BM  . feeding supplement (PRO-STAT SUGAR FREE 64)  30 mL Per Tube BID BM  . insulin aspart  0-20 Units Subcutaneous Q4H  . ipratropium-albuterol  3 mL Nebulization TID  . methylPREDNISolone (SOLU-MEDROL) injection  40 mg Intravenous Q12H  . metoprolol tartrate  25 mg Oral Q6H  . multivitamin with minerals  1 tablet Oral Daily  . pantoprazole (PROTONIX) IV  40 mg Intravenous Q12H  . sodium chloride flush  10-40 mL Intracatheter Q12H   Continuous Infusions: . dextrose 40 mL/hr at 06/04/19 0019  . diltiazem (CARDIZEM) infusion 10 mg/hr (06/04/19 0435)  . feeding supplement (OSMOLITE 1.5 CAL)    . heparin 1,300 Units/hr (06/03/19 2128)  . meropenem (MERREM) IV 500 mg (06/03/19 2258)     LOS: 12 days   The patient is  critically ill with multiple organ systems failure and requires high complexity decision making for assessment and support, frequent evaluation and titration of therapies, application of advanced monitoring technologies and extensive interpretation of multiple databases. Critical Care Time devoted to patient care services described in this note  Time spent: 40 minutes     , Geraldo Docker, MD [facility] Pager [phone]  If 7PM-7AM, please contact night-coverage [email or web address] Password [facility] 06/04/2019, 7:18 AM

## 2019-06-04 NOTE — Progress Notes (Signed)
ANTICOAGULATION CONSULT NOTE - Follow Up Consult  Pharmacy Consult for heparin Indication: atrial fibrillation  Labs: Recent Labs    06/01/19 0641  06/02/19 0814 06/02/19 1845 06/02/19 2227 06/03/19 0301 06/03/19 2027 06/04/19 0313  HGB 9.6*  --   --   --   --  7.9*  --  8.6*  HCT 29.8*  --   --   --   --  25.2*  --  27.3*  PLT 251  --   --   --   --  90*  --  168  LABPROT 16.6*  --  17.4*  --   --  16.3*  --   --   INR 1.4*  --  1.4*  --   --  1.3*  --   --   HEPARINUNFRC  --   --   --   --   --   --  0.20* 0.54  CREATININE 1.97*   < > 1.76* 1.68* 1.60* 1.62*  --   --    < > = values in this interval not displayed.    Assessment/Plan:  78yo female therapeutic on heparin after rate change. Also of note Plt up to 168. Will continue gtt at current rate and confirm stable with additional level.   Wynona Neat, PharmD, BCPS  06/04/2019,4:14 AM

## 2019-06-04 NOTE — Progress Notes (Signed)
ANTICOAGULATION CONSULT NOTE - [facility] for IV heparin Indication: atrial fibrillation  Allergies  Allergen Reactions  . Niacin And Related Rash  . Keflex [Cephalexin] Other (See Comments)    Fatigue, sore throat, body aches  . Sausage [Pickled Meat] Other (See Comments)    Drowsiness and dizziness  . Tape Rash    Bandaids     Patient Measurements: Height: 5\' 4"  (162.6 cm) Weight: 210 lb 15.7 oz (95.7 kg) IBW/kg (Calculated) : 54.7 Heparin Dosing Weight: ~ 80 kg  Vital Signs: Temp: 97.4 F (36.3 C) (06/13 1139) Temp Source: Oral (06/13 1139) BP: 118/81 (06/13 1300) Pulse Rate: 84 (06/13 1300)  Labs: Recent Labs    06/02/19 0814  06/02/19 2227 06/03/19 0301 06/03/19 2027 06/04/19 0313 06/04/19 0746 06/04/19 1152  HGB  --   --   --  7.9*  --  8.6*  --   --   HCT  --   --   --  25.2*  --  27.3*  --   --   PLT  --   --   --  90*  --  168  --   --   LABPROT 17.4*  --   --  16.3*  --  19.8*  --   --   INR 1.4*  --   --  1.3*  --  1.7*  --   --   HEPARINUNFRC  --   --   --   --  0.20* 0.54  --  0.68  CREATININE 1.76*   < > 1.60* 1.62*  --   --  1.55*  --    < > = values in this interval not displayed.    Estimated Creatinine Clearance: 34.1 mL/min (A) (by C-G formula based on SCr of 1.55 mg/dL (H)).   Medical History: Past Medical History:  Diagnosis Date  . Acute respiratory failure with hypoxia ([place]) 05/2019  . Allergic rhinitis   . Anticoagulated on Coumadin   . Arthritis   . Diabetes mellitus   . GERD (gastroesophageal reflux disease)   . Hyperlipidemia   . Hypertension   . Pain in joint, ankle and foot 09/14/2013    Medications:  Scheduled:  . feeding supplement (ENSURE ENLIVE)  237 mL Oral BID BM  . feeding supplement (PRO-STAT SUGAR FREE 64)  30 mL Per Tube BID BM  . insulin aspart  0-20 Units Subcutaneous Q4H  . ipratropium-albuterol  3 mL Nebulization TID  . lidocaine      . methylPREDNISolone (SOLU-MEDROL) injection   40 mg Intravenous Q12H  . metoprolol tartrate  25 mg Oral Q6H  . multivitamin with minerals  1 tablet Oral Daily  . pantoprazole (PROTONIX) IV  40 mg Intravenous Q12H  . sodium chloride flush  10-40 mL Intracatheter Q12H    Assessment: 78 yo female on chronic Coumadin for afib, currently on hold for suspicion of GIB.  Today's INR down to 1.3, no Coumadin given since prior to admission. Pharmacy asked to start IV heparin while Coumadin on hold.   Confirmatory heparin level at the upper end of therapeutic at 0.68 (goal 0.3 - 0.7) on heparin at 1300 units/hr. Patient's CBC is stable, Hgb up from 7.9 > 8.6, and Pltc increased from 90 > 168. No bleeding documented.   Goal of Therapy:  Heparin level 0.3-0.7 units/ml Monitor platelets by anticoagulation protocol: Yes   Plan:  - Decrease heparin to 1250 units/hr - Check daily heparin level and CBC -  Monitor for s/sx of bleeding  Thank you for allowing pharmacy to be a part of this patient's care.  Leron Croak, PharmD PGY1 Pharmacy Resident Phone: [phone]  Please check AMION for all [facility] Pharmacy phone numbers 06/04/2019

## 2019-06-05 ENCOUNTER — Inpatient Hospital Stay (HOSPITAL_COMMUNITY): Payer: Medicare HMO

## 2019-06-05 DIAGNOSIS — Z9889 Other specified postprocedural states: Secondary | ICD-10-CM

## 2019-06-05 LAB — GLUCOSE, CAPILLARY
Glucose-Capillary: 174 mg/dL — ABNORMAL HIGH (ref 70–99)
Glucose-Capillary: 182 mg/dL — ABNORMAL HIGH (ref 70–99)
Glucose-Capillary: 184 mg/dL — ABNORMAL HIGH (ref 70–99)
Glucose-Capillary: 240 mg/dL — ABNORMAL HIGH (ref 70–99)
Glucose-Capillary: 251 mg/dL — ABNORMAL HIGH (ref 70–99)
Glucose-Capillary: 297 mg/dL — ABNORMAL HIGH (ref 70–99)
Glucose-Capillary: 348 mg/dL — ABNORMAL HIGH (ref 70–99)

## 2019-06-05 LAB — CULTURE, BODY FLUID W GRAM STAIN -BOTTLE: Culture: NO GROWTH

## 2019-06-05 LAB — BASIC METABOLIC PANEL
Anion gap: 8 (ref 5–15)
BUN: 49 mg/dL — ABNORMAL HIGH (ref 8–23)
CO2: 22 mmol/L (ref 22–32)
Calcium: 8.6 mg/dL — ABNORMAL LOW (ref 8.9–10.3)
Chloride: 124 mmol/L — ABNORMAL HIGH (ref 98–111)
Creatinine, Ser: 1.59 mg/dL — ABNORMAL HIGH (ref 0.44–1.00)
GFR calc Af Amer: 36 mL/min — ABNORMAL LOW (ref >60)
GFR calc non Af Amer: 31 mL/min — ABNORMAL LOW (ref >60)
Glucose, Bld: 207 mg/dL — ABNORMAL HIGH (ref 70–99)
Potassium: 3.6 mmol/L (ref 3.5–5.1)
Sodium: 154 mmol/L — ABNORMAL HIGH (ref 135–145)

## 2019-06-05 LAB — LACTATE DEHYDROGENASE, PLEURAL OR PERITONEAL FLUID: LD, Fluid: 77 U/L — ABNORMAL HIGH (ref 3–23)

## 2019-06-05 LAB — ALBUMIN, PLEURAL OR PERITONEAL FLUID: Albumin, Fluid: <1 g/dL

## 2019-06-05 LAB — CBC
HCT: 25.7 % — ABNORMAL LOW (ref 36.0–46.0)
Hemoglobin: 8 g/dL — ABNORMAL LOW (ref 12.0–15.0)
MCH: 30.1 pg (ref 26.0–34.0)
MCHC: 31.1 g/dL (ref 30.0–36.0)
MCV: 96.6 fL (ref 80.0–100.0)
Platelets: 193 10*3/uL (ref 150–400)
RBC: 2.66 MIL/uL — ABNORMAL LOW (ref 3.87–5.11)
RDW: 16.8 % — ABNORMAL HIGH (ref 11.5–15.5)
WBC: 14.6 10*3/uL — ABNORMAL HIGH (ref 4.0–10.5)
nRBC: 0.5 % — ABNORMAL HIGH (ref 0.0–0.2)

## 2019-06-05 LAB — PROTIME-INR
INR: 1.6 — ABNORMAL HIGH (ref 0.8–1.2)
Prothrombin Time: 18.5 seconds — ABNORMAL HIGH (ref 11.4–15.2)

## 2019-06-05 LAB — PROTEIN, PLEURAL OR PERITONEAL FLUID: Total protein, fluid: <3 g/dL

## 2019-06-05 LAB — HEPARIN LEVEL (UNFRACTIONATED): Heparin Unfractionated: 0.83 IU/mL — ABNORMAL HIGH (ref 0.30–0.70)

## 2019-06-05 LAB — MAGNESIUM: Magnesium: 2.5 mg/dL — ABNORMAL HIGH (ref 1.7–2.4)

## 2019-06-05 LAB — AMYLASE, PLEURAL OR PERITONEAL FLUID: Amylase, Fluid: 7 U/L

## 2019-06-05 LAB — SARS CORONAVIRUS 2: SARS Coronavirus 2: NOT DETECTED

## 2019-06-05 LAB — ALBUMIN: Albumin: 3.3 g/dL — ABNORMAL LOW (ref 3.5–5.0)

## 2019-06-05 LAB — OCCULT BLOOD X 1 CARD TO LAB, STOOL: Fecal Occult Bld: POSITIVE — AB

## 2019-06-05 MED ORDER — LIDOCAINE HCL (PF) 1 % IJ SOLN
INTRAMUSCULAR | Status: AC
  Filled 2019-06-05: qty 30

## 2019-06-05 MED ORDER — SODIUM CHLORIDE 0.9 % IV SOLN
500.0000 mg | Freq: Two times a day (BID) | INTRAVENOUS | Status: DC
  Administered 2019-06-06 (×2): 500 mg via INTRAVENOUS
  Filled 2019-06-05 (×4): qty 0.5

## 2019-06-05 NOTE — Plan of Care (Signed)
Care plan progressing

## 2019-06-05 NOTE — Progress Notes (Addendum)
MD aware of pt low BP  Pt alert, oxygen sat 98% on HFNC, eating dinner MD stated to keep MAP above 65, if below notify  Will continue to monitor

## 2019-06-05 NOTE — Progress Notes (Signed)
Pt transported with nursing staff to ultrasound

## 2019-06-05 NOTE — Progress Notes (Signed)
Per radiology technician, stopped pt heparin drip Transport set for 1 hour for thoracentesis   Pt aware, will continue to monitor

## 2019-06-05 NOTE — Procedures (Signed)
Ultrasound-guided diagnostic and therapeutic left thoracentesis performed yielding 320 cc of yellow fluid. No immediate complications. Follow-up chest x-ray pending.The fluid was sent to the lab for preordered studies. EBL none. The left pleural effusion was small by today's [place].

## 2019-06-05 NOTE — Progress Notes (Signed)
Pharmacy and MD stated to restart heparin at rate of 11 Heparin restarted   Assessed pt dressing, left middle back, Band-Aid clean, dry and intact

## 2019-06-05 NOTE — Progress Notes (Signed)
ANTICOAGULATION CONSULT NOTE - [place] for IV heparin Indication: atrial fibrillation  Allergies  Allergen Reactions  . Niacin And Related Rash  . Keflex [Cephalexin] Other (See Comments)    Fatigue, sore throat, body aches  . Sausage [Pickled Meat] Other (See Comments)    Drowsiness and dizziness  . Tape Rash    Bandaids     Patient Measurements: Height: 5\' 4"  (162.6 cm) Weight: 212 lb 1.3 oz (96.2 kg) IBW/kg (Calculated) : 54.7 Heparin Dosing Weight: ~ 80 kg  Vital Signs: Temp: 96.9 F (36.1 C) (06/14 0347) Temp Source: Axillary (06/14 0347) BP: 112/79 (06/14 0347) Pulse Rate: 80 (06/14 0738)  Labs: Recent Labs    06/03/19 0301  06/04/19 0313 06/04/19 0746 06/04/19 1152 06/05/19 0517 06/05/19 0957  HGB 7.9*  --  8.6*  --   --  8.0*  --   HCT 25.2*  --  27.3*  --   --  25.7*  --   PLT 90*  --  168  --   --  193  --   LABPROT 16.3*  --  19.8*  --   --   --  18.5*  INR 1.3*  --  1.7*  --   --   --  1.6*  HEPARINUNFRC  --    < > 0.54  --  0.68  --  0.83*  CREATININE 1.62*  --   --  1.55*  --  1.59*  --    < > = values in this interval not displayed.    Estimated Creatinine Clearance: 33.4 mL/min (A) (by C-G formula based on SCr of 1.59 mg/dL (H)).   Medical History: Past Medical History:  Diagnosis Date  . Acute respiratory failure with hypoxia ([place]) 05/2019  . Allergic rhinitis   . Anticoagulated on Coumadin   . Arthritis   . Diabetes mellitus   . GERD (gastroesophageal reflux disease)   . Hyperlipidemia   . Hypertension   . Pain in joint, ankle and foot 09/14/2013    Medications:  Scheduled:  . feeding supplement (ENSURE ENLIVE)  237 mL Oral BID BM  . feeding supplement (PRO-STAT SUGAR FREE 64)  30 mL Per Tube BID BM  . insulin aspart  0-20 Units Subcutaneous Q4H  . insulin glargine  10 Units Subcutaneous QHS  . ipratropium-albuterol  3 mL Nebulization TID  . lidocaine (PF)      . methylPREDNISolone (SOLU-MEDROL)  injection  40 mg Intravenous Q12H  . metoprolol tartrate  25 mg Oral Q6H  . multivitamin with minerals  1 tablet Oral Daily  . pantoprazole (PROTONIX) IV  40 mg Intravenous Q12H  . sodium chloride flush  10-40 mL Intracatheter Q12H    Assessment: 78 yo female on chronic Coumadin for afib, currently on hold for suspicion of GIB.  Today's INR down to 1.3, no Coumadin given since prior to admission. Pharmacy asked to start IV heparin while Coumadin on hold.   Heparin level supratherapeutic this AM. Patient's CBC is stable, Hgb dec from 8.6 > 8.0, HCT 27.3 > 25.7, and pltc 193. No bleeding per nurse, but infusion has been turned off x 1 hour this AM for planned thoracentesis.   Goal of Therapy:  Heparin level 0.3-0.7 units/ml Monitor platelets by anticoagulation protocol: Yes   Plan:  - Decrease heparin to 1100 units/hr - Check heparin level in 8 hours - Check daily heparin level and CBC - Monitor for s/sx of bleeding  Thank you for allowing  pharmacy to be a part of this patient's care.  Leron Croak, PharmD PGY1 Pharmacy Resident Phone: [phone]  Please check AMION for all [facility] Pharmacy phone numbers 06/05/2019

## 2019-06-05 NOTE — Progress Notes (Signed)
Called pharmacy regarding new heparin titration order to re inform drip held per IR thoracentesis  Pharmacist aware

## 2019-06-05 NOTE — Progress Notes (Addendum)
PROGRESS NOTE    Melanie [name]  XEN:[id number] DOB: [birth year] DOA: 05/22/2019 PCP: [name], MD   Brief Narrative:  78 year old WF PMHx dementia, diabetes type 2 uncontrolled with complication, DM neuropathy, HLD, essential HTN, A. fib on warfarin, dysphagia, cholelithiasis, colon polyps,  Admitted on 05/22/2019, presented with complaint of epigastric abdominal pain nausea and vomiting, was found to have acute pancreatitis as well as sepsis of unknown origin. Currently further plan is continue current treatment.    Subjective: 6/14 A/O x3 (does not know why) commands.  States she did not like going down to the lab.      Assessment & Plan:   Principal Problem:   Acute pancreatitis Active Problems:   HTN (hypertension)   DM (diabetes mellitus) type II controlled, neurological manifestation (HCC)   Afib (HCC)   SOB (shortness of breath)   Abdominal discomfort   Acute respiratory failure with hypoxia (HCC)   Cholelithiasis   Sepsis unknown etiology - Intra-abdominal source Vs CAP vs aspiration pneumonia -Negative cultures on admission - Patiently treated with IV vancomycin and Zosyn--> IV Unasyn - 6/6 CXR moderate bilateral pleural effusion with consolidation -ABG shows chronic compensated respiratory alkalosis along with metabolic acidosis and high anion gap -Have completed 7 days of meropenem, however given her continued leucocytosis, without definative source of of infection and patients illness  will Continue Meropenem.    Acute gallstone pancreatitis -Per GI note 6/9 patient will require surgical referral/consultation to consider a cholecystectomy prior to discharge as result of previously felt pancreatitis due to likely biliary/gallstone disease that occurred upon her initial admission.  Once patient more stable. - MRCP negative for CBD dilation or CBD stone  Acute on CKD stage III (baseline Cr 1.13 ) -Multifactorial sepsis, diabetes type 2 uncontrolled,  overdiuresis.  BUN remains elevated (GI bleed?) - Patient remains intravascularly depleted secondary to hypoalbuminemia. - 6/12 Albumin 50 g - Continue gentle hydration D5W 65ml/hr unable to place core track tube or NG tube by IR  Recent Labs  Lab 06/02/19 1845 06/02/19 2227 06/03/19 0301 06/04/19 0746 06/05/19 0517  CREATININE 1.68* 1.60* 1.62* 1.55* 1.59*  -Creatinine continues to mildly improved.   Acute respiratory failure with hypoxia -Multifactorial Bilateral Pleural Effusion, CAP vs atelectasis vs mucous plugging - Mild improvement, remains on HFNC 25 L/min: FiO2 60% - PCXR 6/13 increasing left pleural effusion  Bilateral pleural effusion -6/8 s/p RIGHT thoracentesis/6/9 s/p LEFT thoracentesis see below; work-up showed transudate of effusion -6/13 PCXR shows enlarging LEFT pleural effusion - 6/14 s/p LEFT thoracentesis; 320 cc: Fluid sent to lab for pathology   CAP vs aspiration pneumonia vs atelectasis vs mucous plugging - Patient unable to participate with flutter valve - Physiotherapy vest - Solu-Medrol 40 mg twice daily -DuoNeb every 6 hours - Patient had received hypertonic nebulization, may restart in the a.m. dependent upon findings of PCXR -BiPAP held secondary to drop in BP  Acute on chronic diastolic CHF -1/03 titrate Cardizem drip off: Will help reduce fluid load - Metoprolol IV PRN - Metoprolol 25 mg every 6 -Strict in and out +7.2 L -Daily weight Filed Weights   06/03/19 0500 06/04/19 0500 06/05/19 0347  Weight: 94.2 kg 95.7 kg 96.2 kg  . Chronic atrial fibrillation with RVR -We will start patient on heparin drip today per GI recommendation from 6/9.  Monitor for any bleeding. -6/14 DC heparin at midnight in preparation for endoscopically placed NG tube.  GI bleed? - Multifactorial, secondary to supratherapeutic INR, hemorrhagic. - Occult blood  pending - Trend hemoglobin Recent Labs  Lab 05/31/19 0319 06/01/19 0641 06/03/19 0301 06/04/19 0313  06/05/19 0517  HGB 8.8* 9.6* 7.9* 8.6* 8.0*  -Stable - Trend INR Recent Labs  Lab 05/31/19 0319 06/01/19 0641 06/02/19 0814 06/03/19 0301 06/04/19 0313  INR 1.4* 1.4* 1.4* 1.3* 1.7*  - Protonix 40 mg twice daily  supratherapeutic INR -Resolved   Acute metabolic encephalopathy/dementia/CVA? - CT head unremarkable, and EEG unremarkable.  However patient has patent foramen ovale by echocardiogram with bubble study. - If within the next 24 to 48 hours all metabolic abnormalities have been corrected and still no improvement in patient's cognition will obtain MRI   Diabetes type 2 uncontrolled with complication -3/5 hemoglobin A1c = 7.5  -6/13 start Lantus 10 units nightly - Resistant SSI  Dysphagia -The patient cleared for thin liquids rarely takes any fluids and at all even when encouraged.  Patient definitely is not meeting her daily caloric intake requirements. - 6/12 core track team has attempted to place core track multiple attempts unsuccessfully - 6/13 IR attempted to place NG tube multiple attempts unsuccessfully: Discussed with Dr. Oletta Lamas IR what the difficulty was not passing tube he believes the esophagus inflamed/swollen/strictures.  Recommends we reconsult GI and asked them to try to pass tube endoscopically. - [facility] Dr. Valarie Merino Mansouraty last saw patient on 6/9 will reconsult in a.m. to discuss endoscopic placement of NG tube as patient without adequate nutrition or hydration.  -6/14 spoke with Dr.  Juanita Craver from [facility] discussed patient and they agreed with attempt to place NG tube endoscopically tomorrow 6/15 by Dr. Fuller Plan. - 6/15 DC Heparin gtt after midnight, n.p.o. after midnight.  Obesity Body mass index is 35.68 kg/m.  Continue to monitor for now.   Hypokalemia - Potassium goal> 4   Hypernatremia - Multifactorial poor p.o. intake, dehydration -Patient intravascularly dehydrated secondary to hypoalbuminemia - Slowly correcting with D5W   Hypoalbuminemia - Albumin 50 g   Goals of care - 6/14 attempted to call Erasmo Downer patient's son to update him on patient's plan of care.  Mailbox on the phone was full unable to leave message.    DVT prophylaxis: SCD Code Status: Full Family Communication: None Disposition Plan: TBD   Consultants:  [facility] Neurology    Procedures/Significant Events:  6/4 EEG: Abnormal EEG due to the presence of general background slowing and triphasic waves.  This is seen most commonly in encephalopathic states, most commonly, but not limited to, metabolic encephalopathies 6/5 echocardiogram:-The left ventricle has hyperdynamic systolic function, with an ejection fraction of >65%.-The cavity size was decreased.  -Left Atrium: moderate to severely enlarged . -Right Atrium: mildly dilated. pressure is estimated at 10 mmHg. 6/8 RIGHT thoracentesis.: 1.2 L aspirated 6/9 LEFT thoracentesis; 623ml 6/11 PCXR:-Improved airspace disease in the RIGHT lung. - Persistent LEFT effusion and basilar atelectasis. -Hiatal hernia 6/12 echocardiogram with bubble study: Abnormal bubble contrast study.  Interstitial shunt noted suggestive of patent foramen ovale -LVEF 55 - 60% 6/13 PCXR: Lucency left lower mediastinum corresponds to known hiatal hernia -Moderately large LEFT pleural effusion has increased in size with worsening atelectasis or consolidation in the left lung -Mild patchy airspace opacity persists right lung  6/14 LEFT thoracentesis: 320 cc  I have personally reviewed and interpreted all radiology studies and my findings are as above.  VENTILATOR SETTINGS:    Cultures   Antimicrobials: Anti-infectives (From admission, onward)   Start     Ordered Stop   05/29/19 1000  meropenem (MERREM) 500 mg in sodium chloride 0.9 % 100 mL IVPB     05/29/19 0805     05/28/19 1700  Ampicillin-Sulbactam (UNASYN) 3 g in sodium chloride 0.9 % 100 mL IVPB  Status:  Discontinued      05/28/19 0735 05/29/19 0805   05/27/19 1200  Ampicillin-Sulbactam (UNASYN) 3 g in sodium chloride 0.9 % 100 mL IVPB  Status:  Discontinued     05/27/19 1044 05/28/19 0735   05/23/19 1600  piperacillin-tazobactam (ZOSYN) IVPB 3.375 g  Status:  Discontinued     05/23/19 0933 05/27/19 1044   05/23/19 0800  piperacillin-tazobactam (ZOSYN) IVPB 3.375 g     05/23/19 0141 05/23/19 0911   05/23/19 0100  piperacillin-tazobactam (ZOSYN) IVPB 3.375 g     05/23/19 0059 05/23/19 0241       Devices    LINES / TUBES:      Continuous Infusions: . dextrose 40 mL/hr (06/05/19 0130)  . diltiazem (CARDIZEM) infusion 6 mg/hr (06/05/19 1941)  . feeding supplement (OSMOLITE 1.5 CAL)    . heparin 1,250 Units/hr (06/04/19 1422)  . meropenem (MERREM) IV Stopped (06/04/19 2150)     Objective: Vitals:   06/05/19 0129 06/05/19 0347 06/05/19 0735 06/05/19 0738  BP:  112/79    Pulse: 72 85  80  Resp: 17 (!) 21  (!) 23  Temp:  (!) 96.9 F (36.1 C)    TempSrc:  Axillary    SpO2: 100% 99% 94% 100%  Weight:  96.2 kg    Height:        Intake/Output Summary (Last 24 hours) at 06/05/2019 0810 Last data filed at 06/05/2019 0300 Gross per 24 hour  Intake 2297.29 ml  Output 575 ml  Net 1722.29 ml   Filed Weights   06/03/19 0500 06/04/19 0500 06/05/19 0347  Weight: 94.2 kg 95.7 kg 96.2 kg   Physical Exam:  General: A/O x3 (does not know why), positive  acute respiratory distress Eyes: negative scleral hemorrhage, negative anisocoria, negative icterus ENT: Negative Runny nose, negative gingival bleeding, Neck:  Negative scars, masses, torticollis, lymphadenopathy, JVD Lungs: Clear to auscultation right lung fields, left lower lung fields diminished with rhonchi,  Cardiovascular: Regular rate and rhythm without murmur gallop or rub normal S1 and S2 Abdomen: Obese, negative abdominal pain, nondistended, positive soft, bowel sounds, no rebound, no ascites, no appreciable mass Extremities: No  significant cyanosis, clubbing, or edema bilateral lower extremities Skin: Negative rashes, lesions, ulcers Psychiatric:  Negative depression, negative anxiety, negative fatigue, negative mania, much more interactive after thoracentesis. Central nervous system:  Cranial nerves II through XII intact, tongue/uvula midline, all extremities muscle strength 5/5, sensation intact throughout,  negative dysarthria, negative expressive aphasia, negative receptive aphasia.      Data Reviewed: Care during the described time interval was provided by me .  I have reviewed this patient's available data, including medical history, events of note, physical examination, and all test results as part of my evaluation.   CBC: Recent Labs  Lab 05/29/19 1035  05/31/19 0319 06/01/19 0641 06/03/19 0301 06/04/19 0313 06/05/19 0517  WBC 31.0*   < > 29.4* 27.0* 19.1* 16.7* 14.6*  NEUTROABS 27.2*  --   --  24.9*  --   --   --   HGB 11.1*   < > 8.8* 9.6* 7.9* 8.6* 8.0*  HCT 33.9*   < > 26.7* 29.8* 25.2* 27.3* 25.7*  MCV 91.4   < > 91.8 92.8 97.7 95.8 96.6  PLT 303   < >  229 251 90* 168 193   < > = values in this interval not displayed.   Basic Metabolic Panel: Recent Labs  Lab 05/31/19 1028 06/01/19 0641  06/02/19 1845 06/02/19 2227 06/03/19 0301 06/04/19 0313 06/04/19 0746 06/05/19 0517  NA 155* 156*   < > 157* 156* 156*  --  154* 154*  K 3.7 3.6   < > 4.1 4.1 4.2  --  4.6 3.6  CL 123* 123*   < > 124* 126* 124*  --  125* 124*  CO2 21* 22   < > 25 23 20*  --  18* 22  GLUCOSE 227* 192*   < > 206* 160* 169*  --  237* 207*  BUN 92* 73*   < > 49* 48* 48*  --  46* 49*  CREATININE 2.39* 1.97*   < > 1.68* 1.60* 1.62*  --  1.55* 1.59*  CALCIUM 8.6* 8.5*   < > 8.5* 8.3* 8.2*  --  8.4* 8.6*  MG 2.6* 2.5*  --   --   --  2.3 2.5*  --  2.5*   < > = values in this interval not displayed.   GFR: Estimated Creatinine Clearance: 33.4 mL/min (A) (by C-G formula based on SCr of 1.59 mg/dL (H)). Liver Function  Tests: Recent Labs  Lab 05/29/19 1035 05/31/19 1028 06/01/19 0641 06/03/19 0301 06/04/19 0746 06/05/19 0517  AST 29 22 19 21 24   --   ALT 35 22 22 32 25  --   ALKPHOS 79 68 59 93 75  --   BILITOT 0.9 0.6 0.7 0.8 1.5*  --   PROT 5.4* 5.7* 5.4* 4.9* 5.3*  --   ALBUMIN 2.1* 2.8* 2.7* 2.2* 2.8* 3.3*   Recent Labs  Lab 05/29/19 1035 06/03/19 0301  LIPASE 32 53*  AMYLASE  --  79   Recent Labs  Lab 06/04/19 0313  AMMONIA 33   Coagulation Profile: Recent Labs  Lab 05/31/19 0319 06/01/19 0641 06/02/19 0814 06/03/19 0301 06/04/19 0313  INR 1.4* 1.4* 1.4* 1.3* 1.7*   Cardiac Enzymes: No results for input(s): CKTOTAL, CKMB, CKMBINDEX, TROPONINI in the last 168 hours. BNP (last 3 results) No results for input(s): PROBNP in the last 8760 hours. HbA1C: No results for input(s): HGBA1C in the last 72 hours. CBG: Recent Labs  Lab 06/04/19 1200 06/04/19 1604 06/04/19 2034 06/05/19 0008 06/05/19 0418  GLUCAP 253* 275* 287* 240* 182*   Lipid Profile: No results for input(s): CHOL, HDL, LDLCALC, TRIG, CHOLHDL, LDLDIRECT in the last 72 hours. Thyroid Function Tests: No results for input(s): TSH, T4TOTAL, FREET4, T3FREE, THYROIDAB in the last 72 hours. Anemia Panel: No results for input(s): VITAMINB12, FOLATE, FERRITIN, TIBC, IRON, RETICCTPCT in the last 72 hours. Urine analysis:    Component Value Date/Time   COLORURINE YELLOW 05/29/2019 1115   APPEARANCEUR CLOUDY (A) 05/29/2019 1115   LABSPEC 1.023 05/29/2019 1115   PHURINE 5.0 05/29/2019 1115   GLUCOSEU 150 (A) 05/29/2019 1115   HGBUR SMALL (A) 05/29/2019 1115   BILIRUBINUR NEGATIVE 05/29/2019 1115   BILIRUBINUR negative 11/20/2017 [place] 05/29/2019 1115   PROTEINUR NEGATIVE 05/29/2019 1115   UROBILINOGEN 0.2 11/20/2017 1447   UROBILINOGEN 1.0 11/30/2016 1856   NITRITE NEGATIVE 05/29/2019 1115   LEUKOCYTESUR NEGATIVE 05/29/2019 1115   Sepsis Labs: @LABRCNTIP (procalcitonin:4,lacticidven:4)   ) Recent Results (from the past 240 hour(s))  Culture, blood (routine x 2)     Status: None   Collection Time: 05/29/19 10:26 AM  Specimen: BLOOD  Result Value Ref Range Status   Specimen Description BLOOD LEFT ANTECUBITAL  Final   Special Requests AEROBIC BOTTLE ONLY Blood Culture adequate volume  Final   Culture   Final    NO GROWTH 5 DAYS Performed at [facility], [address] [place], [place] [address]    Report Status 06/03/2019 FINAL  Final  Culture, blood (routine x 2)     Status: None   Collection Time: 05/29/19 10:35 AM   Specimen: BLOOD LEFT ARM  Result Value Ref Range Status   Specimen Description BLOOD LEFT ARM  Final   Special Requests AEROBIC BOTTLE ONLY Blood Culture adequate volume  Final   Culture   Final    NO GROWTH 5 DAYS Performed at [facility], [place] [address] [place], [place] [address]    Report Status 06/03/2019 FINAL  Final  SARS Coronavirus 2 (CEPHEID - Performed in [facility] hospital lab), Hosp Order     Status: None   Collection Time: 05/29/19 10:43 AM   Specimen: Nasopharyngeal Swab  Result Value Ref Range Status   SARS Coronavirus 2 NEGATIVE NEGATIVE Final    Comment: (NOTE) If result is NEGATIVE SARS-CoV-2 target nucleic acids are NOT DETECTED. The SARS-CoV-2 RNA is generally detectable in upper and lower  respiratory specimens during the acute phase of infection. The lowest  concentration of SARS-CoV-2 viral copies this assay can detect is 250  copies / mL. A negative result does not preclude SARS-CoV-2 infection  and should not be used as the sole basis for treatment or other  patient management decisions.  A negative result may occur with  improper specimen collection / handling, submission of specimen other  than nasopharyngeal swab, presence of viral mutation(s) within the  areas targeted by this assay, and inadequate number of viral copies  (<250 copies / mL). A negative result must be combined with clinical   observations, patient history, and epidemiological information. If result is POSITIVE SARS-CoV-2 target nucleic acids are DETECTED. The SARS-CoV-2 RNA is generally detectable in upper and lower  respiratory specimens dur ing the acute phase of infection.  Positive  results are indicative of active infection with SARS-CoV-2.  Clinical  correlation with patient history and other diagnostic information is  necessary to determine patient infection status.  Positive results do  not rule out bacterial infection or co-infection with other viruses. If result is PRESUMPTIVE POSTIVE SARS-CoV-2 nucleic acids MAY BE PRESENT.   A presumptive positive result was obtained on the submitted specimen  and confirmed on repeat testing.  While 2019 novel coronavirus  (SARS-CoV-2) nucleic acids may be present in the submitted sample  additional confirmatory testing may be necessary for epidemiological  and / or clinical management purposes  to differentiate between  SARS-CoV-2 and other Sarbecovirus currently known to infect humans.  If clinically indicated additional testing with an alternate test  methodology [phone]) is advised. The SARS-CoV-2 RNA is generally  detectable in upper and lower respiratory sp ecimens during the acute  phase of infection. The expected result is Negative. Fact Sheet for Patients:  StrictlyIdeas.no Fact Sheet for Healthcare Providers: BankingDealers.co.za This test is not yet approved or cleared by the [place] FDA and has been authorized for detection and/or diagnosis of SARS-CoV-2 by FDA under an Emergency Use Authorization (EUA).  This EUA will remain in effect (meaning this test can be used) for the duration of the COVID-19 declaration under Section 564(b)(1) of the Act, 21 U.S.C.  section 360bbb-3(b)(1), unless the authorization is terminated or revoked sooner. Performed at [facility], [place] [address],  [place], [place] [address]   Gram stain     Status: None   Collection Time: 05/30/19  9:23 AM   Specimen: Pleural, Right; Pleural Fluid  Result Value Ref Range Status   Specimen Description PLEURAL RIGHT  Final   Special Requests NONE  Final   Gram Stain   Final    FEW WBC PRESENT,BOTH PMN AND MONONUCLEAR NO ORGANISMS SEEN Performed at [facility], [place] [address] [place], [place] [address]    Report Status 05/30/2019 FINAL  Final  Culture, body fluid-bottle     Status: None   Collection Time: 05/30/19  9:23 AM   Specimen: Pleura  Result Value Ref Range Status   Specimen Description PLEURAL RIGHT  Final   Special Requests NONE  Final   Culture   Final    NO GROWTH 5 DAYS Performed at [facility] [address], [place], [place] [address]    Report Status 06/04/2019 FINAL  Final  Culture, body fluid-bottle     Status: None (Preliminary result)   Collection Time: 05/31/19  2:02 PM   Specimen: Pleura  Result Value Ref Range Status   Specimen Description PLEURAL  Final   Special Requests FLUID  Final   Culture   Final    NO GROWTH 4 DAYS Performed at [facility] [address] [place], [place] [address]    Report Status PENDING  Incomplete  Gram stain     Status: None   Collection Time: 05/31/19  2:02 PM   Specimen: Pleura  Result Value Ref Range Status   Specimen Description PLEURAL  Final   Special Requests FLUID  Final   Gram Stain   Final    WBC PRESENT,BOTH PMN AND MONONUCLEAR NO ORGANISMS SEEN CYTOSPIN SMEAR Performed at [facility], [address] [place], [place] [address]    Report Status 05/31/2019 FINAL  Final  Respiratory Panel by PCR     Status: None   Collection Time: 06/03/19 10:07 AM   Specimen: Nasopharyngeal Swab; Respiratory  Result Value Ref Range Status   Adenovirus NOT DETECTED NOT DETECTED Final   Coronavirus 229E NOT DETECTED NOT DETECTED Final    Comment: (NOTE) The Coronavirus on the Respiratory Panel, DOES NOT  test for the novel  Coronavirus (2019 nCoV)    Coronavirus HKU1 NOT DETECTED NOT DETECTED Final   Coronavirus NL63 NOT DETECTED NOT DETECTED Final   Coronavirus OC43 NOT DETECTED NOT DETECTED Final   Metapneumovirus NOT DETECTED NOT DETECTED Final   Rhinovirus / Enterovirus NOT DETECTED NOT DETECTED Final   Influenza A NOT DETECTED NOT DETECTED Final   Influenza B NOT DETECTED NOT DETECTED Final   Parainfluenza Virus 1 NOT DETECTED NOT DETECTED Final   Parainfluenza Virus 2 NOT DETECTED NOT DETECTED Final   Parainfluenza Virus 3 NOT DETECTED NOT DETECTED Final   Parainfluenza Virus 4 NOT DETECTED NOT DETECTED Final   Respiratory Syncytial Virus NOT DETECTED NOT DETECTED Final   Bordetella pertussis NOT DETECTED NOT DETECTED Final   Chlamydophila pneumoniae NOT DETECTED NOT DETECTED Final   Mycoplasma pneumoniae NOT DETECTED NOT DETECTED Final    Comment: Performed at [facility], [place]. [address] [place], [place] [address]         Radiology Studies: Dg Chest Port 1 View  Result Date: 06/04/2019 CLINICAL DATA:  Respiratory  failure. EXAM: PORTABLE CHEST 1 VIEW COMPARISON:  06/02/2019 FINDINGS: The left heart border is obscured. Aortic atherosclerosis is noted. Lucency in the left lower mediastinum corresponds to a known hiatal hernia. A moderately large left pleural effusion has increased in size with worsening atelectasis or consolidation in the left lung. Mild patchy airspace opacity persists in the right lung, most notably in the upper lobe and is stable to mildly increased. No pneumothorax is identified. IMPRESSION: Enlarging left pleural effusion with worsening left lung atelectasis or consolidation. Electronically Signed   By: Logan Bores M.D.   On: 06/04/2019 09:37   Dg Addison Bailey G Tube Plc W/fl W/rad  Result Date: 06/04/2019 INDICATION: Malnutition.  Unable to swallow. EXAM: ATTEMPTED FEEDING TUBE PLACEMENT UNDER FLUOROSCOPY FLUOROSCOPY TIME:  1 minutes. 10 seconds.  COMPLICATIONS: None immediate PROCEDURE: The Cortrak feeding tube was lubricated with viscous lidocaine. Initial attempt at placement was made to the right nostril however the feeding tube would not pass. Subsequent attempts were made to the left nostril. The feeding tube was successfully passed through the nasopharynx, however on repeated attempts at placement the tube repeatedly went into the trachea rather than the esophagus. Attempts were made both within and without a guidewire, but were limited as the patient was resistant and could not cooperate by swallowing. After multiple attempts and patient refusing additional attempts, the procedure was terminated. There was no evidence of immediate complication. IMPRESSION: Unsuccessful attempted Cortrak feeding tube placement under fluoroscopy, as discussed above. Percutaneous gastrostomy tube placement may need to be considered. Electronically Signed   By: Earle Gell M.D.   On: 06/04/2019 10:53        Scheduled Meds: . feeding supplement (ENSURE ENLIVE)  237 mL Oral BID BM  . feeding supplement (PRO-STAT SUGAR FREE 64)  30 mL Per Tube BID BM  . insulin aspart  0-20 Units Subcutaneous Q4H  . insulin glargine  10 Units Subcutaneous QHS  . ipratropium-albuterol  3 mL Nebulization TID  . methylPREDNISolone (SOLU-MEDROL) injection  40 mg Intravenous Q12H  . metoprolol tartrate  25 mg Oral Q6H  . multivitamin with minerals  1 tablet Oral Daily  . pantoprazole (PROTONIX) IV  40 mg Intravenous Q12H  . sodium chloride flush  10-40 mL Intracatheter Q12H   Continuous Infusions: . dextrose 40 mL/hr (06/05/19 0130)  . diltiazem (CARDIZEM) infusion 6 mg/hr (06/05/19 2993)  . feeding supplement (OSMOLITE 1.5 CAL)    . heparin 1,250 Units/hr (06/04/19 1422)  . meropenem (MERREM) IV Stopped (06/04/19 2150)     LOS: 13 days   The patient is critically ill with multiple organ systems failure and requires high complexity decision making for assessment and  support, frequent evaluation and titration of therapies, application of advanced monitoring technologies and extensive interpretation of multiple databases. Critical Care Time devoted to patient care services described in this note  Time spent: 40 minutes     WOODS, Geraldo Docker, MD [facility] Pager [phone]  If 7PM-7AM, please contact night-coverage [email or web address] Password [email or web address] 06/05/2019, 8:10 AM

## 2019-06-06 DIAGNOSIS — I482 Chronic atrial fibrillation, unspecified: Secondary | ICD-10-CM

## 2019-06-06 DIAGNOSIS — K8011 Calculus of gallbladder with chronic cholecystitis with obstruction: Secondary | ICD-10-CM

## 2019-06-06 DIAGNOSIS — I10 Essential (primary) hypertension: Secondary | ICD-10-CM

## 2019-06-06 DIAGNOSIS — I5033 Acute on chronic diastolic (congestive) heart failure: Secondary | ICD-10-CM

## 2019-06-06 DIAGNOSIS — R4702 Dysphasia: Secondary | ICD-10-CM

## 2019-06-06 DIAGNOSIS — J90 Pleural effusion, not elsewhere classified: Secondary | ICD-10-CM

## 2019-06-06 LAB — GLUCOSE, CAPILLARY
Glucose-Capillary: 211 mg/dL — ABNORMAL HIGH (ref 70–99)
Glucose-Capillary: 174 mg/dL — ABNORMAL HIGH (ref 70–99)
Glucose-Capillary: 159 mg/dL — ABNORMAL HIGH (ref 70–99)
Glucose-Capillary: 166 mg/dL — ABNORMAL HIGH (ref 70–99)
Glucose-Capillary: 173 mg/dL — ABNORMAL HIGH (ref 70–99)
Glucose-Capillary: 263 mg/dL — ABNORMAL HIGH (ref 70–99)

## 2019-06-06 LAB — CBC
HCT: 26.6 % — ABNORMAL LOW (ref 36.0–46.0)
Hemoglobin: 8.2 g/dL — ABNORMAL LOW (ref 12.0–15.0)
MCH: 30.1 pg (ref 26.0–34.0)
MCHC: 30.8 g/dL (ref 30.0–36.0)
MCV: 97.8 fL (ref 80.0–100.0)
Platelets: 223 10*3/uL (ref 150–400)
RBC: 2.72 MIL/uL — ABNORMAL LOW (ref 3.87–5.11)
RDW: 16.7 % — ABNORMAL HIGH (ref 11.5–15.5)
WBC: 12 10*3/uL — ABNORMAL HIGH (ref 4.0–10.5)
nRBC: 0.4 % — ABNORMAL HIGH (ref 0.0–0.2)

## 2019-06-06 LAB — BASIC METABOLIC PANEL
Anion gap: 11 (ref 5–15)
BUN: 51 mg/dL — ABNORMAL HIGH (ref 8–23)
CO2: 22 mmol/L (ref 22–32)
Calcium: 8.3 mg/dL — ABNORMAL LOW (ref 8.9–10.3)
Chloride: 120 mmol/L — ABNORMAL HIGH (ref 98–111)
Creatinine, Ser: 1.5 mg/dL — ABNORMAL HIGH (ref 0.44–1.00)
GFR calc Af Amer: 39 mL/min — ABNORMAL LOW (ref >60)
GFR calc non Af Amer: 33 mL/min — ABNORMAL LOW (ref >60)
Glucose, Bld: 222 mg/dL — ABNORMAL HIGH (ref 70–99)
Potassium: 3.5 mmol/L (ref 3.5–5.1)
Sodium: 153 mmol/L — ABNORMAL HIGH (ref 135–145)

## 2019-06-06 LAB — COMPREHENSIVE METABOLIC PANEL
ALT: 54 U/L — ABNORMAL HIGH (ref 0–44)
AST: 34 U/L (ref 15–41)
Albumin: 2.9 g/dL — ABNORMAL LOW (ref 3.5–5.0)
Alkaline Phosphatase: 76 U/L (ref 38–126)
Anion gap: 12 (ref 5–15)
BUN: 52 mg/dL — ABNORMAL HIGH (ref 8–23)
CO2: 20 mmol/L — ABNORMAL LOW (ref 22–32)
Calcium: 8.3 mg/dL — ABNORMAL LOW (ref 8.9–10.3)
Chloride: 121 mmol/L — ABNORMAL HIGH (ref 98–111)
Creatinine, Ser: 1.59 mg/dL — ABNORMAL HIGH (ref 0.44–1.00)
GFR calc Af Amer: 36 mL/min — ABNORMAL LOW (ref >60)
GFR calc non Af Amer: 31 mL/min — ABNORMAL LOW (ref >60)
Glucose, Bld: 188 mg/dL — ABNORMAL HIGH (ref 70–99)
Potassium: 4.1 mmol/L (ref 3.5–5.1)
Sodium: 153 mmol/L — ABNORMAL HIGH (ref 135–145)
Total Bilirubin: 0.8 mg/dL (ref 0.3–1.2)
Total Protein: 5.1 g/dL — ABNORMAL LOW (ref 6.5–8.1)

## 2019-06-06 LAB — MAGNESIUM
Magnesium: 2.5 mg/dL — ABNORMAL HIGH (ref 1.7–2.4)
Magnesium: 2.6 mg/dL — ABNORMAL HIGH (ref 1.7–2.4)

## 2019-06-06 LAB — TRIGLYCERIDES, BODY FLUIDS: Triglycerides, Fluid: 12 mg/dL

## 2019-06-06 LAB — GRAM STAIN

## 2019-06-06 LAB — LIPASE, BLOOD: Lipase: 30 U/L (ref 11–51)

## 2019-06-06 LAB — PROTIME-INR
INR: 1.6 — ABNORMAL HIGH (ref 0.8–1.2)
Prothrombin Time: 18.4 seconds — ABNORMAL HIGH (ref 11.4–15.2)

## 2019-06-06 MED ORDER — METHYLPREDNISOLONE SODIUM SUCC 125 MG IJ SOLR
60.0000 mg | Freq: Every day | INTRAMUSCULAR | Status: DC
  Administered 2019-06-07 – 2019-06-08 (×2): 60 mg via INTRAVENOUS
  Filled 2019-06-06 (×2): qty 2

## 2019-06-06 MED ORDER — PANTOPRAZOLE SODIUM 40 MG PO TBEC
40.0000 mg | DELAYED_RELEASE_TABLET | Freq: Two times a day (BID) | ORAL | Status: DC
  Administered 2019-06-06 – 2019-06-13 (×14): 40 mg via ORAL
  Filled 2019-06-06 (×14): qty 1

## 2019-06-06 MED ORDER — ALBUMIN HUMAN 25 % IV SOLN
50.0000 g | Freq: Once | INTRAVENOUS | Status: AC
  Administered 2019-06-06: 50 g via INTRAVENOUS
  Filled 2019-06-06: qty 200

## 2019-06-06 MED ORDER — HEPARIN (PORCINE) 25000 UT/250ML-% IV SOLN
1100.0000 [IU]/h | INTRAVENOUS | Status: AC
  Administered 2019-06-06: 1100 [IU]/h via INTRAVENOUS
  Filled 2019-06-06: qty 250

## 2019-06-06 MED ORDER — ENSURE ENLIVE PO LIQD
237.0000 mL | Freq: Three times a day (TID) | ORAL | Status: DC
  Administered 2019-06-06 – 2019-06-08 (×4): 237 mL via ORAL

## 2019-06-06 MED ORDER — SODIUM CHLORIDE 0.9 % IV SOLN
INTRAVENOUS | Status: DC
  Administered 2019-06-06 – 2019-06-07 (×2): via INTRAVENOUS

## 2019-06-06 MED ORDER — SODIUM CHLORIDE 0.9 % IV SOLN
1.0000 g | Freq: Two times a day (BID) | INTRAVENOUS | Status: DC
  Administered 2019-06-07 (×2): 1 g via INTRAVENOUS
  Filled 2019-06-06 (×3): qty 1

## 2019-06-06 NOTE — Progress Notes (Signed)
Progress Note  Patient Name: Melanie [name] Date of Encounter: 06/06/2019  Primary Cardiologist: Sherren Mocha, MD   Subjective   Feeling better.  Has a productive cough.  HR well controlled in afib off IV Cardizem gtt and only on Lopressor  Inpatient Medications    Scheduled Meds: . feeding supplement (ENSURE ENLIVE)  237 mL Oral BID BM  . feeding supplement (PRO-STAT SUGAR FREE 64)  30 mL Per Tube BID BM  . insulin aspart  0-20 Units Subcutaneous Q4H  . insulin glargine  10 Units Subcutaneous QHS  . ipratropium-albuterol  3 mL Nebulization TID  . methylPREDNISolone (SOLU-MEDROL) injection  40 mg Intravenous Q12H  . metoprolol tartrate  25 mg Oral Q6H  . multivitamin with minerals  1 tablet Oral Daily  . pantoprazole (PROTONIX) IV  40 mg Intravenous Q12H  . sodium chloride flush  10-40 mL Intracatheter Q12H   Continuous Infusions: . dextrose 40 mL/hr at 06/06/19 0444  . diltiazem (CARDIZEM) infusion Stopped (06/05/19 1540)  . feeding supplement (OSMOLITE 1.5 CAL)    . meropenem (MERREM) IV 500 mg (06/06/19 0143)   PRN Meds: acetaminophen **OR** acetaminophen, HYDROmorphone (DILAUDID) injection, lidocaine (PF), LORazepam, metoprolol tartrate, ondansetron **OR** ondansetron (ZOFRAN) IV, sodium chloride flush   Vital Signs    Vitals:   06/05/19 1830 06/05/19 2000 06/05/19 2100 06/05/19 2340  BP: 126/65 133/72  104/64  Pulse:  81  73  Resp: (!) 24 (!) 21  20  Temp:   98.5 F (36.9 C) 98.1 F (36.7 C)  TempSrc:   Oral Oral  SpO2:  100%  97%  Weight:    100.1 kg  Height:        Intake/Output Summary (Last 24 hours) at 06/06/2019 0743 Last data filed at 06/06/2019 0500 Gross per 24 hour  Intake 1768.91 ml  Output 600 ml  Net 1168.91 ml   Filed Weights   06/04/19 0500 06/05/19 0347 06/05/19 2340  Weight: 95.7 kg 96.2 kg 100.1 kg    Telemetry    Atrial fibrillation - Personally Reviewed  ECG    No new EKG to review - Personally Reviewed  Physical  Exam   GEN: No acute distress.   Neck: No JVD Cardiac: irregularly irregular, no murmurs, rubs, or gallops.  Respiratory: scattered rhonchi GI: Soft, nontender, non-distended  MS: trace LE edema; No deformity. Neuro:  Nonfocal  Psych: Normal affect   Labs    Chemistry Recent Labs  Lab 06/01/19 0641  06/03/19 0301 06/04/19 0746 06/05/19 0517  NA 156*   < > 156* 154* 154*  K 3.6   < > 4.2 4.6 3.6  CL 123*   < > 124* 125* 124*  CO2 22   < > 20* 18* 22  GLUCOSE 192*   < > 169* 237* 207*  BUN 73*   < > 48* 46* 49*  CREATININE 1.97*   < > 1.62* 1.55* 1.59*  CALCIUM 8.5*   < > 8.2* 8.4* 8.6*  PROT 5.4*  --  4.9* 5.3*  --   ALBUMIN 2.7*  --  2.2* 2.8* 3.3*  AST 19  --  21 24  --   ALT 22  --  32 25  --   ALKPHOS 59  --  93 75  --   BILITOT 0.7  --  0.8 1.5*  --   GFRNONAA 24*   < > 30* 32* 31*  GFRAA 28*   < > 35* 37* 36*  ANIONGAP 11   < >  12 11 8    < > = values in this interval not displayed.     Hematology Recent Labs  Lab 06/03/19 0301 06/04/19 0313 06/05/19 0517  WBC 19.1* 16.7* 14.6*  RBC 2.58* 2.85* 2.66*  HGB 7.9* 8.6* 8.0*  HCT 25.2* 27.3* 25.7*  MCV 97.7 95.8 96.6  MCH 30.6 30.2 30.1  MCHC 31.3 31.5 31.1  RDW 16.8* 16.6* 16.8*  PLT 90* 168 193    Cardiac EnzymesNo results for input(s): TROPONINI in the last 168 hours. No results for input(s): TROPIPOC in the last 168 hours.   BNPNo results for input(s): BNP, PROBNP in the last 168 hours.   DDimer No results for input(s): DDIMER in the last 168 hours.   Radiology    Dg Chest 1 View  Result Date: 06/05/2019 CLINICAL DATA:  Left-sided thoracentesis.  Follow-up. EXAM: CHEST  1 VIEW COMPARISON:  June 04, 2019 FINDINGS: The left-sided pleural effusion is smaller. A small effusion with underlying atelectasis remains. No pneumothorax after thoracentesis. No other changes. IMPRESSION: The left-sided pleural effusion is smaller in the interval. No pneumothorax after thoracentesis. Electronically Signed   By:  Dorise Bullion III M.D   On: 06/05/2019 12:58   Dg Loyce Dys Tube Plc W/fl W/rad  Result Date: 06/04/2019 INDICATION: Malnutition.  Unable to swallow. EXAM: ATTEMPTED FEEDING TUBE PLACEMENT UNDER FLUOROSCOPY FLUOROSCOPY TIME:  1 minutes. 10 seconds. COMPLICATIONS: None immediate PROCEDURE: The Cortrak feeding tube was lubricated with viscous lidocaine. Initial attempt at placement was made to the right nostril however the feeding tube would not pass. Subsequent attempts were made to the left nostril. The feeding tube was successfully passed through the nasopharynx, however on repeated attempts at placement the tube repeatedly went into the trachea rather than the esophagus. Attempts were made both within and without a guidewire, but were limited as the patient was resistant and could not cooperate by swallowing. After multiple attempts and patient refusing additional attempts, the procedure was terminated. There was no evidence of immediate complication. IMPRESSION: Unsuccessful attempted Cortrak feeding tube placement under fluoroscopy, as discussed above. Percutaneous gastrostomy tube placement may need to be considered. Electronically Signed   By: Earle Gell M.D.   On: 06/04/2019 10:53   US Thoracentesis Asp Pleural Space W/img Guide  Result Date: 06/05/2019 INDICATION: Patient with history of CHF, bilateral pleural effusions; request made for diagnostic and therapeutic left thoracentesis. EXAM: ULTRASOUND GUIDED DIAGNOSTIC AND THERAPEUTIC LEFT THORACENTESIS MEDICATIONS: None COMPLICATIONS: None immediate. PROCEDURE: An ultrasound guided thoracentesis was thoroughly discussed with the patient's daughter (patient with history of dementia) and questions answered. The benefits, risks, alternatives and complications were also discussed. The patient's daughter understands and wishes to proceed with the procedure. Written consent was obtained. Ultrasound was performed to localize and mark an adequate pocket of  fluid in the left chest. The area was then prepped and draped in the normal sterile fashion. 1% Lidocaine was used for local anesthesia. Under ultrasound guidance a 6 Fr Safe-T-Centesis catheter was introduced. Thoracentesis was performed. The catheter was removed and a dressing applied. FINDINGS: A total of approximately 320 cc of yellow fluid was removed. Samples were sent to the laboratory as requested by the clinical team. The left pleural effusion was small by today's ultrasound. IMPRESSION: Successful ultrasound guided diagnostic and therapeutic left thoracentesis yielding 320 cc of pleural fluid. Read by: Rowe Robert, PA-C Electronically Signed   By: Jerilynn Mages.  Shick M.D.   On: 06/05/2019 12:50    Cardiac Studies   2D echo  06/02/2019  IMPRESSIONS   1. The left ventricle has normal systolic function, with an ejection fraction of 55-60%. No evidence of left ventricular regional wall motion abnormalities.  SUMMARY   Abnormal bubble contrast study. Interatrial shunt noted suggestive of patent foramen ovale.  Patient Profile     78 y.o. female with a history of paroxysmal atrial fibrillation/flutter on Coumadin, hypertension, hyperlipidemia, diabetes mellitus, and athritis, who is beingfollowed for management ofatrial fibrillation  Assessment & Plan    1.  Permanent atrial fibrillation/flutter -likely driven by her pancreatitis and GIB -HR well controlled -continue Lopressor to 25mg  q 6 hours (hold for SBP < 100) -current not on anticoagulation due to GIB (was on coumadin at home) -start IV Heparin gtt once ok with GI  2.  Hypertension -BP has been soft and dropped into the low 19'U systolic yesterday -improved BP this am  3.  Acute on chronic diastolic CHF -exacerbation felt secondary to IVF for sepsis. -2D echo with EF > 65%. -s/p right thoracentesis of 1.2L  -she has mild LE edema on exam.   -still hypernatremic and felt to be due to dehydration and hypoalbuminemia -slowly  improving with D5W  4.  Acute Pancreatitis -management per primary team -for EGD with placement of Coretrak FT today  5.  Acute GIB -GI following -Hbg 8 yesterday and 8.2 today  6.  AKI -Creatinine peaked at 3.8 on 6/7 -Creatinine 1.5 today     For questions or updates, please contact [place] Please consult [email or web address] for contact info under Cardiology/STEMI.      Signed, Fransico Him, MD  06/06/2019, 7:43 AM

## 2019-06-06 NOTE — Progress Notes (Signed)
Speech Language Pathology Discharge Patient Details Name: Melanie [name] MRN: [id number] DOB: September 21, 1941 Today's Date: 06/06/2019   Pt NPO for procedure today - NG placement and potential EGD.  SLP had been following pt for dysphagia - issues with airway protection were related to mental status and respiratory/swallowing coordination.  She has improved from those perspectives with no ongoing oropharyngeal deficit.  Primary barriers now are GI in origin and outside of SLP purview.   Our service will sign off.  D/W RN.     Bernardo Brayman L. Tivis Ringer, [place] Office number [phone] Pager [phone]

## 2019-06-06 NOTE — Progress Notes (Signed)
PHARMACY NOTE:  ANTIMICROBIAL RENAL DOSAGE ADJUSTMENT  Current antimicrobial regimen includes a mismatch between antimicrobial dosage and estimated renal function.  As per policy approved by the Pharmacy & Therapeutics and Medical Executive Committees, the antimicrobial dosage will be adjusted accordingly.  Current antimicrobial dosage:  Meropenem 500 mg q 12 hrs  Indication: Pancreatitis  Renal Function: CrCl ~ 35 ML/MIN  Estimated Creatinine Clearance: 34.1 mL/min (A) (by C-G formula based on SCr of 1.59 mg/dL (H)). []      On intermittent HD, scheduled: []      On CRRT    Antimicrobial dosage has been changed to:  Meropenem 1g IV q 12 hrs.  Additional comments:  Consider d/c soon?   Thank you for allowing pharmacy to be a part of this patient's care.  Marguerite Olea, [facility] Clinical Pharmacist Phone [phone]  06/06/2019 3:22 PM

## 2019-06-06 NOTE — Progress Notes (Addendum)
Daily Rounding Note  06/06/2019, 8:39 AM  LOS: 14 days   SUBJECTIVE:   Chief complaint: RD unable to pass/place coretrack FT.  Radiologist likewise unable to pass FT.  On repeated attempts with and without guidewire 6/13, the tube repeatedly entered the trachea rather than the esophagus.  Ultimately pt would not tolerate further attempts. Hospitalist requests EGD, PEG Pts latest EGD in 08/2015.  For dysphagia.  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 4cm HH.   Hx chronic diarrhea, cause indeterminate.  03/2018 Colonoscopy with bxs  unrevealing. 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        Followed by GI 6/8 - 6/10 for melena, anemia in setting of INR 8.2.  Bleeding stopped and anemia stable with correction INR.  Given pulmonary comorbidities, EGD not pursued.  GI not initially consulted for acute, presumed biliary,  pancreatitis.  AC (for A fib) temporarily stooped due to anemia, GIB.  Heparin restarted 6/14.  SLP rec was for pt to remain NPO   Underwent Thoracentesis x 3 (latest 6/14) for pleural effusions.  Required Bipap and high-flow nasal oxygen.     Remote hx esoph stricture.  Initially SLP rec was NPO, but note of 6/11 OKd thin liquid, puree diet.  She was not meeting nutritional goals so attempted to initiate tube feedings Staff unable to pass Cortrak feeding tube beyond esophagus 6/12.  During attempts, O2 sats dropped.   CTAP 6/7: Moderate hiatal hernia with fluid filling the mid and distal esophagus. This is concerning for reflux versus poor esophageal motility and could represent a potential aspiration risk.  Similar appearance of the pancreas which is edematous and poorly defined consistent with pancreatitis. Perhaps slightly increased peripancreatic fluid and edema adjacent to the pancreatic tail compared to recent prior imaging. The remainder of the edema along the body and head  appears similar. No new pseudocyst or gas to suggest pancreatic necrosis. Secondary inflammatory thickening of the first and second portions of the duodenum again noted.  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  She denies abdominal pain.  She is not aware of any acute difficulty swallowing.  No abdominal pain.  No nausea.   OBJECTIVE:         Vital signs in last 24 hours:    Temp:  [98.1 F (36.7 C)-98.5 F (36.9 C)] 98.1 F (36.7 C) (06/14 2340) Pulse Rate:  [72-105] 73 (06/14 2340) Resp:  [13-24] 20 (06/14 2340) BP: (71-141)/(46-97) 104/64 (06/14 2340) SpO2:  [92 %-100 %] 97 % (06/14 2340) FiO2 (%):  [50 %-60 %] 50 % (06/14 2320) Weight:  [100.1 kg] 100.1 kg (06/14 2340) Last BM Date: 06/05/19 Filed Weights   06/04/19 0500 06/05/19 0347 06/05/19 2340  Weight: 95.7 kg 96.2 kg 100.1 kg   General: Ill-appearing, pleasant, alert. Heart: RRR. Chest: Rhonchi throughout.  Intermittent wet cough. Abdomen: Not tender or distended.  Bowel sounds somewhat hypoactive but not tinkling or tympanitic. Rectal:  flexiseal in place, dark brown not bloody liquid stool present.   Extremities: Nonpitting lower extremity and upper extremity edema. Neuro/Psych: Oriented x3.  Appropriate.  Moves all 4 limbs, strength not tested.  No tremor.  Not anxious or depressed though affect somewhat flat  Intake/Output from previous day: 06/14 0701 - 06/15 0700 In: 1768.9 [P.O.:630; I.V.:1038.9; IV Piggyback:100] Out: 600 [Urine:600]  Intake/Output this shift: No intake/output data recorded.  Lab Results: Recent Labs    06/04/19 0313 06/05/19 0517 06/06/19 0620  WBC 16.7* 14.6* 12.0*  HGB 8.6* 8.0* 8.2*  HCT 27.3* 25.7* 26.6*  PLT 168 193 223   BMET Recent Labs    06/04/19 0746 06/05/19 0517 06/06/19 0620  NA 154* 154* 153*  K 4.6 3.6 3.5  CL 125* 124* 120*  CO2 18* 22 22  GLUCOSE 237*  207* 222*  BUN 46* 49* 51*  CREATININE 1.55* 1.59* 1.50*  CALCIUM 8.4* 8.6* 8.3*   LFT Recent Labs    06/04/19 0746 06/05/19 0517  PROT 5.3*  --   ALBUMIN 2.8* 3.3*  AST 24  --   ALT 25  --   ALKPHOS 75  --   BILITOT 1.5*  --    PT/INR Recent Labs    06/05/19 0957 06/06/19 0620  LABPROT 18.5* 18.4*  INR 1.6* 1.6*   Hepatitis Panel No results for input(s): HEPBSAG, HCVAB, HEPAIGM, HEPBIGM in the last 72 hours.  Studies/Results: Dg Chest 1 View  Result Date: 06/05/2019 CLINICAL DATA:  Left-sided thoracentesis.  Follow-up. EXAM: CHEST  1 VIEW COMPARISON:  June 04, 2019 FINDINGS: The left-sided pleural effusion is smaller. A small effusion with underlying atelectasis remains. No pneumothorax after thoracentesis. No other changes. IMPRESSION: The left-sided pleural effusion is smaller in the interval. No pneumothorax after thoracentesis. Electronically Signed   By: Dorise Bullion III M.D   On: 06/05/2019 12:58   Dg Loyce Dys Tube Plc W/fl W/rad  Result Date: 06/04/2019 INDICATION: Malnutition.  Unable to swallow. EXAM: ATTEMPTED FEEDING TUBE PLACEMENT UNDER FLUOROSCOPY FLUOROSCOPY TIME:  1 minutes. 10 seconds. COMPLICATIONS: None immediate PROCEDURE: The Cortrak feeding tube was lubricated with viscous lidocaine. Initial attempt at placement was made to the right nostril however the feeding tube would not pass. Subsequent attempts were made to the left nostril. The feeding tube was successfully passed through the nasopharynx, however on repeated attempts at placement the tube repeatedly went into the trachea rather than the esophagus. Attempts were made both within and without a guidewire, but were limited as the patient was resistant and could not cooperate by swallowing. After multiple attempts and patient refusing additional attempts, the procedure was terminated. There was no evidence of immediate complication. IMPRESSION: Unsuccessful attempted Cortrak feeding tube placement under  fluoroscopy, as discussed above. Percutaneous gastrostomy tube placement may need to be considered. Electronically Signed   By: Earle Gell M.D.   On: 06/04/2019 10:53   US Thoracentesis Asp Pleural Space W/img Guide  Result Date: 06/05/2019 INDICATION: Patient with history of CHF, bilateral pleural effusions; request made for diagnostic and therapeutic left thoracentesis. EXAM: ULTRASOUND GUIDED DIAGNOSTIC AND THERAPEUTIC LEFT THORACENTESIS MEDICATIONS: None COMPLICATIONS: None immediate. PROCEDURE: An ultrasound guided thoracentesis was thoroughly discussed with the patient's daughter (patient with history of dementia) and questions answered. The benefits, risks, alternatives and complications were also discussed. The patient's daughter understands and wishes to proceed with the procedure. Written consent was obtained. Ultrasound was performed to localize and mark an adequate pocket of fluid in the left chest. The area was then prepped and draped in the normal sterile fashion. 1% Lidocaine was used for local anesthesia. Under ultrasound guidance a 6 Fr Safe-T-Centesis catheter was introduced. Thoracentesis was performed. The catheter was removed and a dressing applied. FINDINGS: A total of approximately 320 cc of yellow fluid was removed. Samples were sent to the laboratory as requested by the clinical team. The left  pleural effusion was small by today's ultrasound. IMPRESSION: Successful ultrasound guided diagnostic and therapeutic left thoracentesis yielding 320 cc of pleural fluid. Read by: Rowe Robert, PA-C Electronically Signed   By: Jerilynn Mages.  Shick M.D.   On: 06/05/2019 12:50   Scheduled Meds:  feeding supplement (ENSURE ENLIVE)  237 mL Oral BID BM   feeding supplement (PRO-STAT SUGAR FREE 64)  30 mL Per Tube BID BM   insulin aspart  0-20 Units Subcutaneous Q4H   insulin glargine  10 Units Subcutaneous QHS   ipratropium-albuterol  3 mL Nebulization TID   methylPREDNISolone (SOLU-MEDROL)  injection  40 mg Intravenous Q12H   metoprolol tartrate  25 mg Oral Q6H   multivitamin with minerals  1 tablet Oral Daily   pantoprazole (PROTONIX) IV  40 mg Intravenous Q12H   sodium chloride flush  10-40 mL Intracatheter Q12H   Continuous Infusions:  dextrose 40 mL/hr at 06/06/19 0444   diltiazem (CARDIZEM) infusion Stopped (06/05/19 1540)   feeding supplement (OSMOLITE 1.5 CAL)     meropenem (MERREM) IV 500 mg (06/06/19 0143)   PRN Meds:.acetaminophen **OR** acetaminophen, HYDROmorphone (DILAUDID) injection, lidocaine (PF), LORazepam, metoprolol tartrate, ondansetron **OR** ondansetron (ZOFRAN) IV, sodium chloride flush ASSESMENT:   *  RD and radiology unable to pass NG feeding tube.  Pt with hs dysphagia and dilation esoph stricture 2016  *   Acute, biliary, pancreatitis.  Too ill to consider lap chole, surgery has not seen pt.  CTAP 6/1 and 6/7.  Lipase approaching normal and LFTs normalized as of 6/12.      *   Chronic AC, Coumadin stopped in setting GIB, Heparin started but on hold.    *   GI bleed in setting of INR 8, resolved.  On Protonix 40 mg IV BID.    *   Pleural effusions.  S/p 3 thoracentesis  *   Normocytic anemia.  Not iron, B12, Folate deficient.     PLAN   *  Pt not wanting EGD today (with possible placement of coretrak FT).  Ordered purrees, thins per SLP rec Arrange for EGD, possible esoph dilatation, possible coretrak placement tmrw.  D/w pt and she is agreeable.  Risks of esoph perforation, bleeding, reaction to sedation d/w pt.      *   Consider removing flexiseal in next few days, leaving in place runs risk of causing rectal ulcer and bleeding.      Melanie [name]  06/06/2019, 8:39 AM Phone [phone]

## 2019-06-06 NOTE — Progress Notes (Signed)
PROGRESS NOTE    Melanie [name]  SWN:[id number] DOB: [birth year] DOA: 05/22/2019 PCP: [name], MD   Brief Narrative:  78 year old WF PMHx dementia, diabetes type 2 uncontrolled with complication, DM neuropathy, HLD, essential HTN, A. fib on warfarin, dysphagia, cholelithiasis, colon polyps,  Admitted on 05/22/2019, presented with complaint of epigastric abdominal pain nausea and vomiting, was found to have acute pancreatitis as well as sepsis of unknown origin. Currently further plan is continue current treatment.    Subjective: 6/15/O x3 (does not know why).  States that she ate too much today.   Assessment & Plan:   Principal Problem:   Acute pancreatitis Active Problems:   HTN (hypertension)   DM (diabetes mellitus) type II controlled, neurological manifestation (HCC)   Afib (HCC)   SOB (shortness of breath)   Abdominal discomfort   Acute respiratory failure with hypoxia (HCC)   Cholelithiasis   Pleural effusion on left   Acute on chronic diastolic heart failure (HCC)   Sepsis unknown etiology - Intra-abdominal source Vs CAP vs aspiration pneumonia -Negative cultures on admission - Patiently treated with IV vancomycin and Zosyn--> IV Unasyn - 6/6 CXR moderate bilateral pleural effusion with consolidation -ABG shows chronic compensated respiratory alkalosis along with metabolic acidosis and high anion gap -Have completed 7 days of meropenem, however given her continued leucocytosis, without definative source of of infection and patients illness  will Continue Meropenem.    Acute gallstone pancreatitis -Per GI note 6/9 patient will require surgical referral/consultation to consider a cholecystectomy prior to discharge as result of previously felt pancreatitis due to likely biliary/gallstone disease that occurred upon her initial admission.  Once patient more stable. - MRCP negative for CBD dilation or CBD stone  Acute on CKD stage III (baseline Cr 1.13 )  -Multifactorial sepsis, diabetes type 2 uncontrolled, overdiuresis.  BUN remains elevated (GI bleed?) - Patient remains intravascularly depleted secondary to hypoalbuminemia. - 6/12 Albumin 50 g - Continue gentle hydration D5W 81ml/hr unable to place core track tube or NG tube by IR  Recent Labs  Lab 06/03/19 0301 06/04/19 0746 06/05/19 0517 06/06/19 0620 06/06/19 0904  CREATININE 1.62* 1.55* 1.59* 1.50* 1.59*  -Creatinine continues to mildly improved.   Acute respiratory failure with hypoxia -Multifactorial Bilateral Pleural Effusion, CAP vs atelectasis vs mucous plugging - 6/15 continues to make mild improvements, HFNC 25 L/min: FiO2 45%  - PCXR 6/13 increasing left pleural effusion  Bilateral pleural effusion -6/8 s/p RIGHT thoracentesis/6/9 s/p LEFT thoracentesis see below; work-up showed transudate of effusion -6/13 PCXR shows enlarging LEFT pleural effusion - 6/14 s/p LEFT thoracentesis; 320 cc: Fluid sent to lab for pathology   CAP vs aspiration pneumonia vs atelectasis vs mucous plugging - Patient unable to participate with flutter valve - Physiotherapy vest - 6/15 decrease Solu-Medrol 60 mg daily -DuoNeb every 6 hours - Restart hypertonic nebulization on 6/16  -BiPAP held secondary to drop in BP  Acute on chronic diastolic CHF -9/38 titrate Cardizem drip off: Will help reduce fluid load - Metoprolol IV PRN - Metoprolol 25 mg every 6 -Strict in and out +8.5 L -Daily weight Filed Weights   06/04/19 0500 06/05/19 0347 06/05/19 2340  Weight: 95.7 kg 96.2 kg 100.1 kg  . Chronic atrial fibrillation with RVR -We will start patient on heparin drip today per GI recommendation from 6/9.  Monitor for any bleeding. -6/15 continue to hold heparin per GI note will place core track tube endoscopically on 6/16 .  Hypotensive - Secondary to patient's  poor nutritional status i.e. hypovolemic - 6/15 Albumin 50 g x 1 - Maintain MAP> 65 if cannot maintain above 65 start levophed   GI bleed? - Multifactorial, secondary to supratherapeutic INR, hemorrhagic. - Occult blood pending - Trend hemoglobin Recent Labs  Lab 06/01/19 0641 06/03/19 0301 06/04/19 0313 06/05/19 0517 06/06/19 0620  HGB 9.6* 7.9* 8.6* 8.0* 8.2*  -Stable - Trend INR Recent Labs  Lab 06/02/19 0814 06/03/19 0301 06/04/19 0313 06/05/19 0957 06/06/19 0620  INR 1.4* 1.3* 1.7* 1.6* 1.6*  - Protonix 40 mg twice daily  supratherapeutic INR -Resolved   Acute metabolic encephalopathy/dementia/CVA? - CT head unremarkable, and EEG unremarkable.  However patient has patent foramen ovale by echocardiogram with bubble study. - If within the next 24 to 48 hours all metabolic abnormalities have been corrected and still no improvement in patient's cognition will obtain MRI   Diabetes type 2 uncontrolled with complication -3/5 hemoglobin A1c = 7.5  -6/13 start Lantus 10 units nightly - Resistant SSI  Dysphagia -The patient cleared for thin liquids rarely takes any fluids and at all even when encouraged.  Patient definitely is not meeting her daily caloric intake requirements. - 6/12 core track team has attempted to place core track multiple attempts unsuccessfully - 6/13 IR attempted to place NG tube multiple attempts unsuccessfully: Discussed with Dr. Oletta Lamas IR what the difficulty was not passing tube he believes the esophagus inflamed/swollen/strictures.  Recommends we reconsult GI and asked them to try to pass tube endoscopically. - [facility] Dr. Valarie Merino Mansouraty last saw patient on 6/9 will reconsult in a.m. to discuss endoscopic placement of NG tube as patient without adequate nutrition or hydration.  -6/14 spoke with Dr.  Juanita Craver from [facility] discussed patient and they agreed with attempt to place NG tube endoscopically tomorrow 6/15 by Dr. Fuller Plan. - 6/15 Per GI note will place core track tube endoscopically on 6/16 continue to hold heparin.  Obesity Body mass index is 35.68 kg/m.   Continue to monitor for now.   Hypokalemia - Potassium goal> 4   Hypernatremia - Multifactorial poor p.o. intake, dehydration -Patient intravascularly dehydrated secondary to hypoalbuminemia - Slowly correcting with D5W    Goals of care - 6/14 attempted to call Erasmo Downer patient's son to update him on patient's plan of care.  Mailbox on the phone was full unable to leave message.    DVT prophylaxis: SCD Code Status: Full Family Communication: None Disposition Plan: TBD   Consultants:  [facility] PCC[facility] Neurology    Procedures/Significant Events:  6/4 EEG: Abnormal EEG due to the presence of general background slowing and triphasic waves.  This is seen most commonly in encephalopathic states, most commonly, but not limited to, metabolic encephalopathies 6/5 echocardiogram:-The left ventricle has hyperdynamic systolic function, with an ejection fraction of >65%.-The cavity size was decreased.  -Left Atrium: moderate to severely enlarged . -Right Atrium: mildly dilated. pressure is estimated at 10 mmHg. 6/8 RIGHT thoracentesis.: 1.2 L aspirated 6/9 LEFT thoracentesis; 673ml 6/11 PCXR:-Improved airspace disease in the RIGHT lung. - Persistent LEFT effusion and basilar atelectasis. -Hiatal hernia 6/12 echocardiogram with bubble study: Abnormal bubble contrast study.  Interstitial shunt noted suggestive of patent foramen ovale -LVEF 55 - 60% 6/13 PCXR: Lucency left lower mediastinum corresponds to known hiatal hernia -Moderately large LEFT pleural effusion has increased in size with worsening atelectasis or consolidation in the left lung -Mild patchy airspace opacity persists right lung  6/14 LEFT thoracentesis: 320 cc  I have personally reviewed  and interpreted all radiology studies and my findings are as above.  VENTILATOR SETTINGS:    Cultures   Antimicrobials: Anti-infectives (From admission, onward)   Start     Ordered Stop   05/29/19 1000   meropenem (MERREM) 500 mg in sodium chloride 0.9 % 100 mL IVPB     05/29/19 0805     05/28/19 1700  Ampicillin-Sulbactam (UNASYN) 3 g in sodium chloride 0.9 % 100 mL IVPB  Status:  Discontinued     05/28/19 0735 05/29/19 0805   05/27/19 1200  Ampicillin-Sulbactam (UNASYN) 3 g in sodium chloride 0.9 % 100 mL IVPB  Status:  Discontinued     05/27/19 1044 05/28/19 0735   05/23/19 1600  piperacillin-tazobactam (ZOSYN) IVPB 3.375 g  Status:  Discontinued     05/23/19 0933 05/27/19 1044   05/23/19 0800  piperacillin-tazobactam (ZOSYN) IVPB 3.375 g     05/23/19 0141 05/23/19 0911   05/23/19 0100  piperacillin-tazobactam (ZOSYN) IVPB 3.375 g     05/23/19 0059 05/23/19 0241       Devices    LINES / TUBES:      Continuous Infusions: . sodium chloride 20 mL/hr at 06/06/19 1431  . dextrose 40 mL/hr at 06/06/19 0444  . diltiazem (CARDIZEM) infusion Stopped (06/05/19 1540)  . feeding supplement (OSMOLITE 1.5 CAL)    . heparin    . [START ON 06/07/2019] meropenem (MERREM) IV       Objective: Vitals:   06/06/19 0900 06/06/19 0950 06/06/19 1148 06/06/19 1554  BP:  (!) 132/108 (!) 122/91 95/65  Pulse: 75 94 85 (!) 54  Resp: 18  16 (!) 23  Temp:   97.8 F (36.6 C) (!) 97.5 F (36.4 C)  TempSrc:   Oral Oral  SpO2: 99%  98% 99%  Weight:      Height:        Intake/Output Summary (Last 24 hours) at 06/06/2019 1621 Last data filed at 06/06/2019 1509 Gross per 24 hour  Intake 1913.41 ml  Output 600 ml  Net 1313.41 ml   Filed Weights   06/04/19 0500 06/05/19 0347 06/05/19 2340  Weight: 95.7 kg 96.2 kg 100.1 kg   Physical Exam:  General: A/O x3 (does not know why) positive acute respiratory distress Eyes: negative scleral hemorrhage, negative anisocoria, negative icterus ENT: Negative Runny nose, negative gingival bleeding, Neck:  Negative scars, masses, torticollis, lymphadenopathy, JVD Lungs: Tachypneic, diffuse rhonchi, without wheezes or crackles Cardiovascular: Tachycardic,  without murmur gallop or rub normal S1 and S2 Abdomen: Obese, negative abdominal pain, nondistended, positive soft, bowel sounds, no rebound, no ascites, no appreciable mass Extremities: No significant cyanosis, clubbing, or edema bilateral lower extremities Skin: Negative rashes, lesions, ulcers Psychiatric:  Negative depression, negative anxiety, negative fatigue, negative mania  Central nervous system:  Cranial nerves II through XII intact, tongue/uvula midline, all extremities muscle strength 5/5, sensation intact throughout,negative dysarthria, negative expressive aphasia, negative receptive aphasia.     Data Reviewed: Care during the described time interval was provided by me .  I have reviewed this patient's available data, including medical history, events of note, physical examination, and all test results as part of my evaluation.   CBC: Recent Labs  Lab 06/01/19 0641 06/03/19 0301 06/04/19 0313 06/05/19 0517 06/06/19 0620  WBC 27.0* 19.1* 16.7* 14.6* 12.0*  NEUTROABS 24.9*  --   --   --   --   HGB 9.6* 7.9* 8.6* 8.0* 8.2*  HCT 29.8* 25.2* 27.3* 25.7* 26.6*  MCV 92.8 97.7  95.8 96.6 97.8  PLT 251 90* 168 193 742   Basic Metabolic Panel: Recent Labs  Lab 06/03/19 0301 06/04/19 0313 06/04/19 0746 06/05/19 0517 06/06/19 0620 06/06/19 0904  NA 156*  --  154* 154* 153* 153*  K 4.2  --  4.6 3.6 3.5 4.1  CL 124*  --  125* 124* 120* 121*  CO2 20*  --  18* 22 22 20*  GLUCOSE 169*  --  237* 207* 222* 188*  BUN 48*  --  46* 49* 51* 52*  CREATININE 1.62*  --  1.55* 1.59* 1.50* 1.59*  CALCIUM 8.2*  --  8.4* 8.6* 8.3* 8.3*  MG 2.3 2.5*  --  2.5* 2.5* 2.6*   GFR: Estimated Creatinine Clearance: 34.1 mL/min (A) (by C-G formula based on SCr of 1.59 mg/dL (H)). Liver Function Tests: Recent Labs  Lab 05/31/19 1028 06/01/19 0641 06/03/19 0301 06/04/19 0746 06/05/19 0517 06/06/19 0904  AST 22 19 21 24   --  34  ALT 22 22 32 25  --  54*  ALKPHOS 68 59 93 75  --  76   BILITOT 0.6 0.7 0.8 1.5*  --  0.8  PROT 5.7* 5.4* 4.9* 5.3*  --  5.1*  ALBUMIN 2.8* 2.7* 2.2* 2.8* 3.3* 2.9*   Recent Labs  Lab 06/03/19 0301 06/06/19 0904  LIPASE 53* 30  AMYLASE 79  --    Recent Labs  Lab 06/04/19 0313  AMMONIA 33   Coagulation Profile: Recent Labs  Lab 06/02/19 0814 06/03/19 0301 06/04/19 0313 06/05/19 0957 06/06/19 0620  INR 1.4* 1.3* 1.7* 1.6* 1.6*   Cardiac Enzymes: No results for input(s): CKTOTAL, CKMB, CKMBINDEX, TROPONINI in the last 168 hours. BNP (last 3 results) No results for input(s): PROBNP in the last 8760 hours. HbA1C: No results for input(s): HGBA1C in the last 72 hours. CBG: Recent Labs  Lab 06/06/19 0358 06/06/19 0827 06/06/19 1145 06/06/19 1418 06/06/19 1552  GLUCAP 211* 174* 159* 166* 173*   Lipid Profile: No results for input(s): CHOL, HDL, LDLCALC, TRIG, CHOLHDL, LDLDIRECT in the last 72 hours. Thyroid Function Tests: No results for input(s): TSH, T4TOTAL, FREET4, T3FREE, THYROIDAB in the last 72 hours. Anemia Panel: No results for input(s): VITAMINB12, FOLATE, FERRITIN, TIBC, IRON, RETICCTPCT in the last 72 hours. Urine analysis:    Component Value Date/Time   COLORURINE YELLOW 05/29/2019 1115   APPEARANCEUR CLOUDY (A) 05/29/2019 1115   LABSPEC 1.023 05/29/2019 1115   PHURINE 5.0 05/29/2019 1115   GLUCOSEU 150 (A) 05/29/2019 1115   HGBUR SMALL (A) 05/29/2019 1115   BILIRUBINUR NEGATIVE 05/29/2019 1115   BILIRUBINUR negative 11/20/2017 [place] 05/29/2019 1115   PROTEINUR NEGATIVE 05/29/2019 1115   UROBILINOGEN 0.2 11/20/2017 1447   UROBILINOGEN 1.0 11/30/2016 1856   NITRITE NEGATIVE 05/29/2019 1115   LEUKOCYTESUR NEGATIVE 05/29/2019 1115   Sepsis Labs: @LABRCNTIP (procalcitonin:4,lacticidven:4)  ) Recent Results (from the past 240 hour(s))  Culture, blood (routine x 2)     Status: None   Collection Time: 05/29/19 10:26 AM   Specimen: BLOOD  Result Value Ref Range Status   Specimen  Description BLOOD LEFT ANTECUBITAL  Final   Special Requests AEROBIC BOTTLE ONLY Blood Culture adequate volume  Final   Culture   Final    NO GROWTH 5 DAYS Performed at [facility], [place] [address] [place], [place] [address]    Report Status 06/03/2019 FINAL  Final  Culture, blood (routine x 2)     Status: None  Collection Time: 05/29/19 10:35 AM   Specimen: BLOOD LEFT ARM  Result Value Ref Range Status   Specimen Description BLOOD LEFT ARM  Final   Special Requests AEROBIC BOTTLE ONLY Blood Culture adequate volume  Final   Culture   Final    NO GROWTH 5 DAYS Performed at [facility], [address], [place], [place] [address]    Report Status 06/03/2019 FINAL  Final  SARS Coronavirus 2 (CEPHEID - Performed in [place] hospital lab), Hosp Order     Status: None   Collection Time: 05/29/19 10:43 AM   Specimen: Nasopharyngeal Swab  Result Value Ref Range Status   SARS Coronavirus 2 NEGATIVE NEGATIVE Final    Comment: (NOTE) If result is NEGATIVE SARS-CoV-2 target nucleic acids are NOT DETECTED. The SARS-CoV-2 RNA is generally detectable in upper and lower  respiratory specimens during the acute phase of infection. The lowest  concentration of SARS-CoV-2 viral copies this assay can detect is 250  copies / mL. A negative result does not preclude SARS-CoV-2 infection  and should not be used as the sole basis for treatment or other  patient management decisions.  A negative result may occur with  improper specimen collection / handling, submission of specimen other  than nasopharyngeal swab, presence of viral mutation(s) within the  areas targeted by this assay, and inadequate number of viral copies  (<250 copies / mL). A negative result must be combined with clinical  observations, patient history, and epidemiological information. If result is POSITIVE SARS-CoV-2 target nucleic acids are DETECTED. The SARS-CoV-2 RNA is generally detectable in upper and lower   respiratory specimens dur ing the acute phase of infection.  Positive  results are indicative of active infection with SARS-CoV-2.  Clinical  correlation with patient history and other diagnostic information is  necessary to determine patient infection status.  Positive results do  not rule out bacterial infection or co-infection with other viruses. If result is PRESUMPTIVE POSTIVE SARS-CoV-2 nucleic acids MAY BE PRESENT.   A presumptive positive result was obtained on the submitted specimen  and confirmed on repeat testing.  While 2019 novel coronavirus  (SARS-CoV-2) nucleic acids may be present in the submitted sample  additional confirmatory testing may be necessary for epidemiological  and / or clinical management purposes  to differentiate between  SARS-CoV-2 and other Sarbecovirus currently known to infect humans.  If clinically indicated additional testing with an alternate test  methodology [phone]) is advised. The SARS-CoV-2 RNA is generally  detectable in upper and lower respiratory sp ecimens during the acute  phase of infection. The expected result is Negative. Fact Sheet for Patients:  StrictlyIdeas.no Fact Sheet for Healthcare Providers: BankingDealers.co.za This test is not yet approved or cleared by the [place] FDA and has been authorized for detection and/or diagnosis of SARS-CoV-2 by FDA under an Emergency Use Authorization (EUA).  This EUA will remain in effect (meaning this test can be used) for the duration of the COVID-19 declaration under Section 564(b)(1) of the Act, 21 U.S.C. section 360bbb-3(b)(1), unless the authorization is terminated or revoked sooner. Performed at [facility], [place] [address], [place], [place] [address]   Gram stain     Status: None   Collection Time: 05/30/19  9:23 AM   Specimen: Pleural, Right; Pleural Fluid  Result Value Ref Range Status   Specimen Description PLEURAL  RIGHT  Final   Special Requests NONE  Final   Gram Stain   Final  FEW WBC PRESENT,BOTH PMN AND MONONUCLEAR NO ORGANISMS SEEN Performed at [name], [place] [address] [place], [place] [address]    Report Status 05/30/2019 FINAL  Final  Culture, body fluid-bottle     Status: None   Collection Time: 05/30/19  9:23 AM   Specimen: Pleura  Result Value Ref Range Status   Specimen Description PLEURAL RIGHT  Final   Special Requests NONE  Final   Culture   Final    NO GROWTH 5 DAYS Performed at [place] [address], [place], [place] [address]    Report Status 06/04/2019 FINAL  Final  Culture, body fluid-bottle     Status: None   Collection Time: 05/31/19  2:02 PM   Specimen: Pleura  Result Value Ref Range Status   Specimen Description PLEURAL  Final   Special Requests FLUID  Final   Culture   Final    NO GROWTH 5 DAYS Performed at [facility], [address], [place], [place] [address]    Report Status 06/05/2019 FINAL  Final  Gram stain     Status: None   Collection Time: 05/31/19  2:02 PM   Specimen: Pleura  Result Value Ref Range Status   Specimen Description PLEURAL  Final   Special Requests FLUID  Final   Gram Stain   Final    WBC PRESENT,BOTH PMN AND MONONUCLEAR NO ORGANISMS SEEN CYTOSPIN SMEAR Performed at [facility], [address], [place], [place] [address]    Report Status 05/31/2019 FINAL  Final  Respiratory Panel by PCR     Status: None   Collection Time: 06/03/19 10:07 AM   Specimen: Nasopharyngeal Swab; Respiratory  Result Value Ref Range Status   Adenovirus NOT DETECTED NOT DETECTED Final   Coronavirus 229E NOT DETECTED NOT DETECTED Final    Comment: (NOTE) The Coronavirus on the Respiratory Panel, DOES NOT test for the novel  Coronavirus (2019 nCoV)    Coronavirus HKU1 NOT DETECTED NOT DETECTED Final   Coronavirus NL63 NOT DETECTED NOT DETECTED Final   Coronavirus OC43 NOT DETECTED NOT DETECTED Final    Metapneumovirus NOT DETECTED NOT DETECTED Final   Rhinovirus / Enterovirus NOT DETECTED NOT DETECTED Final   Influenza A NOT DETECTED NOT DETECTED Final   Influenza B NOT DETECTED NOT DETECTED Final   Parainfluenza Virus 1 NOT DETECTED NOT DETECTED Final   Parainfluenza Virus 2 NOT DETECTED NOT DETECTED Final   Parainfluenza Virus 3 NOT DETECTED NOT DETECTED Final   Parainfluenza Virus 4 NOT DETECTED NOT DETECTED Final   Respiratory Syncytial Virus NOT DETECTED NOT DETECTED Final   Bordetella pertussis NOT DETECTED NOT DETECTED Final   Chlamydophila pneumoniae NOT DETECTED NOT DETECTED Final   Mycoplasma pneumoniae NOT DETECTED NOT DETECTED Final    Comment: Performed at [facility], [place]. [address], [place], [place] [address]  SARS Coronavirus 2     Status: None   Collection Time: 06/05/19  9:05 AM  Result Value Ref Range Status   SARS Coronavirus 2 NOT DETECTED NOT DETECTED Final    Comment: (NOTE) SARS-CoV-2 target nucleic acids are NOT DETECTED. The SARS-CoV-2 RNA is generally detectable in upper and lower respiratory specimens during the acute phase of infection.  Negative  results do not preclude SARS-CoV-2 infection, do not rule out co-infections with other pathogens, and should not be used as the sole basis for treatment or other patient management decisions.  Negative results must be combined with  clinical observations, patient history, and epidemiological information. The expected result is Not Detected. Fact Sheet for Patients: http://www.biofiredefense.com/wp-content/uploads/2020/03/BIOFIRE-COVID -19-patients.pdf Fact Sheet for Healthcare Providers: http://www.biofiredefense.com/wp-content/uploads/2020/03/BIOFIRE-COVID -19-hcp.pdf This test is not yet approved or cleared by the [place] and  has been authorized for detection and/or diagnosis of SARS-CoV-2 by FDA under an Emergency Use Authorization (EUA).  This EUA will remain in effec t (meaning this  test can be used) for the duration of  the COVID-19 declaration under Section 564(b)(1) of the Act, 21 U.S.C. section 360bbb-3(b)(1), unless the authorization is terminated or revoked sooner. Performed at [facility], [place] [address] [place], [place] [address]   Gram stain     Status: None   Collection Time: 06/05/19 12:49 PM   Specimen: Pleural, Left; Pleural Fluid  Result Value Ref Range Status   Specimen Description PLEURAL LEFT  Final   Special Requests NONE  Final   Gram Stain   Final    FEW WBC PRESENT, PREDOMINANTLY MONONUCLEAR NO ORGANISMS SEEN Performed at [facility], [place] [address], [place], [place] [address]    Report Status 06/06/2019 FINAL  Final         Radiology Studies: Dg Chest 1 View  Result Date: 06/05/2019 CLINICAL DATA:  Left-sided thoracentesis.  Follow-up. EXAM: CHEST  1 VIEW COMPARISON:  June 04, 2019 FINDINGS: The left-sided pleural effusion is smaller. A small effusion with underlying atelectasis remains. No pneumothorax after thoracentesis. No other changes. IMPRESSION: The left-sided pleural effusion is smaller in the interval. No pneumothorax after thoracentesis. Electronically Signed   By: Dorise Bullion III M.D   On: 06/05/2019 12:58   US Thoracentesis Asp Pleural Space W/img Guide  Result Date: 06/05/2019 INDICATION: Patient with history of CHF, bilateral pleural effusions; request made for diagnostic and therapeutic left thoracentesis. EXAM: ULTRASOUND GUIDED DIAGNOSTIC AND THERAPEUTIC LEFT THORACENTESIS MEDICATIONS: None COMPLICATIONS: None immediate. PROCEDURE: An ultrasound guided thoracentesis was thoroughly discussed with the patient's daughter (patient with history of dementia) and questions answered. The benefits, risks, alternatives and complications were also discussed. The patient's daughter understands and wishes to proceed with the procedure. Written consent was obtained. Ultrasound was performed to localize and mark an  adequate pocket of fluid in the left chest. The area was then prepped and draped in the normal sterile fashion. 1% Lidocaine was used for local anesthesia. Under ultrasound guidance a 6 Fr Safe-T-Centesis catheter was introduced. Thoracentesis was performed. The catheter was removed and a dressing applied. FINDINGS: A total of approximately 320 cc of yellow fluid was removed. Samples were sent to the laboratory as requested by the clinical team. The left pleural effusion was small by today's ultrasound. IMPRESSION: Successful ultrasound guided diagnostic and therapeutic left thoracentesis yielding 320 cc of pleural fluid. Read by: Rowe Robert, PA-C Electronically Signed   By: Jerilynn Mages.  Shick M.D.   On: 06/05/2019 12:50        Scheduled Meds: . feeding supplement (ENSURE ENLIVE)  237 mL Oral TID BM  . feeding supplement (PRO-STAT SUGAR FREE 64)  30 mL Per Tube BID BM  . insulin aspart  0-20 Units Subcutaneous Q4H  . insulin glargine  10 Units Subcutaneous QHS  . ipratropium-albuterol  3 mL Nebulization TID  . methylPREDNISolone (SOLU-MEDROL) injection  40 mg Intravenous Q12H  . metoprolol tartrate  25 mg Oral Q6H  . multivitamin with minerals  1 tablet Oral Daily  . pantoprazole  40 mg Oral BID  . sodium chloride flush  10-40 mL Intracatheter  Q12H   Continuous Infusions: . sodium chloride 20 mL/hr at 06/06/19 1431  . dextrose 40 mL/hr at 06/06/19 0444  . diltiazem (CARDIZEM) infusion Stopped (06/05/19 1540)  . feeding supplement (OSMOLITE 1.5 CAL)    . heparin    . [START ON 06/07/2019] meropenem (MERREM) IV       LOS: 14 days   The patient is critically ill with multiple organ systems failure and requires high complexity decision making for assessment and support, frequent evaluation and titration of therapies, application of advanced monitoring technologies and extensive interpretation of multiple databases. Critical Care Time devoted to patient care services described in this note  Time  spent: 40 minutes     Triton Heidrich, Geraldo Docker, MD [facility] Pager [phone]  If 7PM-7AM, please contact night-coverage [email or web address] Password [facility] 06/06/2019, 4:21 PM

## 2019-06-06 NOTE — Progress Notes (Signed)
Pt's left arm with edema and redness, near pt's midline catheter. IV team assessed pt's catheter at bedside. No issues with midline. Arm propped on pillow and pt instructed to let staff know if she experiences any pain with the catheter. Pt verbalized understanding. Will continue to monitor.

## 2019-06-06 NOTE — Progress Notes (Signed)
ANTICOAGULATION CONSULT NOTE - [place] for IV heparin Indication: atrial fibrillation  Allergies  Allergen Reactions  . Niacin And Related Rash  . Keflex [Cephalexin] Other (See Comments)    Fatigue, sore throat, body aches  . Sausage [Pickled Meat] Other (See Comments)    Drowsiness and dizziness  . Tape Rash    Bandaids     Patient Measurements: Height: 5\' 4"  (162.6 cm) Weight: 220 lb 10.9 oz (100.1 kg) IBW/kg (Calculated) : 54.7 Heparin Dosing Weight: ~ 80 kg  Vital Signs: Temp: 98.1 F (36.7 C) (06/15 0800) Temp Source: Oral (06/15 0800) BP: 128/82 (06/15 0800) Pulse Rate: 75 (06/15 0900)  Labs: Recent Labs    06/04/19 0313 06/04/19 0746 06/04/19 1152 06/05/19 0517 06/05/19 0957 06/06/19 0620  HGB 8.6*  --   --  8.0*  --  8.2*  HCT 27.3*  --   --  25.7*  --  26.6*  PLT 168  --   --  193  --  223  LABPROT 19.8*  --   --   --  18.5* 18.4*  INR 1.7*  --   --   --  1.6* 1.6*  HEPARINUNFRC 0.54  --  0.68  --  0.83*  --   CREATININE  --  1.55*  --  1.59*  --  1.50*    Estimated Creatinine Clearance: 36.1 mL/min (A) (by C-G formula based on SCr of 1.5 mg/dL (H)).   Medical History: Past Medical History:  Diagnosis Date  . Acute respiratory failure with hypoxia ([place]) 05/2019  . Allergic rhinitis   . Anticoagulated on Coumadin   . Arthritis   . Diabetes mellitus   . GERD (gastroesophageal reflux disease)   . Hyperlipidemia   . Hypertension   . Pain in joint, ankle and foot 09/14/2013    Medications:  Scheduled:  . feeding supplement (ENSURE ENLIVE)  237 mL Oral BID BM  . feeding supplement (PRO-STAT SUGAR FREE 64)  30 mL Per Tube BID BM  . insulin aspart  0-20 Units Subcutaneous Q4H  . insulin glargine  10 Units Subcutaneous QHS  . ipratropium-albuterol  3 mL Nebulization TID  . methylPREDNISolone (SOLU-MEDROL) injection  40 mg Intravenous Q12H  . metoprolol tartrate  25 mg Oral Q6H  . multivitamin with minerals  1 tablet  Oral Daily  . pantoprazole (PROTONIX) IV  40 mg Intravenous Q12H  . sodium chloride flush  10-40 mL Intracatheter Q12H    Assessment: 78 yo female on chronic Coumadin for afib, currently on hold for suspicion of GIB.  Today's INR down to 1.6, no Coumadin given since prior to admission. Pharmacy asked to start IV heparin while Coumadin on hold.   Heparin turned off last night at midnight in anticipation of endoscopy today.   Patient's CBC is stable, Hgb dec from 8.6 > 8.2 pltc 223.   Goal of Therapy:  Heparin level 0.3-0.7 units/ml Monitor platelets by anticoagulation protocol: Yes   Plan:  - F/u plans to resume heparin after procedure today.  Marguerite Olea, [facility] Clinical Pharmacist Phone [phone]  06/06/2019 9:14 AM

## 2019-06-06 NOTE — Progress Notes (Addendum)
Inpatient Diabetes Program Recommendations  AACE/ADA: New Consensus Statement on Inpatient Glycemic Control (2015)  Target Ranges:  Prepandial:   less than 140 mg/dL      Peak postprandial:   less than 180 mg/dL (1-2 hours)      Critically ill patients:  140 - 180 mg/dL   Results for Melanie [name], Melanie [name] (MRN [id number]) as of 06/06/2019 10:27  Ref. Range 06/05/2019 08:39 06/05/2019 13:00 06/05/2019 16:13 06/05/2019 20:50 06/05/2019 23:42 06/06/2019 03:58 06/06/2019 08:27  Glucose-Capillary Latest Ref Range: 70 - 99 mg/dL 174 (H) 184 (H) 251 (H) 348 (H) 297 (H) 211 (H) 174 (H)    Review of Glycemic Control  Diabetes history: DM 2 Outpatient Diabetes medications: Metformin 500 mg bid Current orders for Inpatient glycemic control:  Lantus 10 units Novolog 0-20 units Q4 hours  A1c 7.4% on 6/7  Inpatient Diabetes Program Recommendations:  Fasting glucose in the 100's. Patient now on IV Solumedrol 40 mg Q12 hours. Glucose trends increase into the 300's after meals.  Consider Novolog 4 units tid meal coverage if patient consumes at least 50 % of meals and glucose is at least 80 mg/dl.  Thanks,  Tama Headings RN, MSN, BC-ADM Inpatient Diabetes Coordinator Team Pager [phone] (8a-5p)

## 2019-06-06 NOTE — Progress Notes (Signed)
Nutrition Follow-up  RD working remotely.  DOCUMENTATION CODES:   Obesity unspecified  INTERVENTION:   -Increase Ensure Enlive po TID, each supplement provides 350 kcal and 20 grams of protein -Continue MVI with minerals daily -Continue Magic cup TID with meals, each supplement provides 290 kcal and 9 grams of protein -Once feeding access is obtained:   InitiateOsmolite 1.5@ 67ml/hr via feeding tubeand increase by 10 ml every 8hours to goal rate of 62ml/hr.   46ml Prostat BID.   Tube feeding regimen provides1820kcal (100% of needs),98grams of protein, and 828ml of H2O.   NUTRITION DIAGNOSIS:   Inadequate oral intake related to altered GI function as evidenced by NPO status.  Progressing; advanced to dysphagia 1 diet with thin liquids; possible tube placement in EGD tomorrow  GOAL:   Patient will meet greater than or equal to 90% of their needs  Progressing  MONITOR:   Diet advancement, Labs, Weight trends, Skin, I & O's  REASON FOR ASSESSMENT:   Low [place]    ASSESSMENT:   Melanie [name] is a 78 y.o. female with medical history significant of DM2, HTN.  Patient presents to the ED with c/o epigastric abd pain, N/V.  Symptoms onset suddenly 3 hours ago, symptoms are severe, nothing makes better or worse, pain located in epigastrium.  6/1- s/p MRCP- no stone revealed 6/5- s/p BSE- recommend continued NPO status; s/p soap suds enema with BM 6/6- CXR revealed moderate bilateral pleural effusions 6/7- BSE cancelled secondary to increased O2 support 6/8- s/p rt thoracentesis (1.2 L of hazy gold fluid removed), s/p BSE- recommend continued NPO with ice chips after oral care 6/9- s/p lt thoracentesis (650 ml hazy gold fluid removed), s/p [place] bilateral chest revealed bilateral lung sliding, trivial pleural effusions, and significant atelectasis  6/10- s/p BSE- advanced to full liquids 6/12- cortrak placement attemptedbut unsuccessful due to pt desatting  during procedure 6/13- IR attempted tube placement but also unsuccessful (tube unable to pass secondary to esophagus inflamed/swollen/strictures) 6/14- s/p lt thoracentesis (320 ml yellow fluid removed)  TF orders in, however, not started due to inability to secure feeding access.   Pt refusing EGD today. GI PA advanced diet to dysphagia 1 diet with thin liquids per SLP recommendation. Intake remains poor; PO: 0-25%. Plan for EGD with possible dilation and possible feeding tube placement tomorrow.  Medications reviewed and include prednisone.  Labs reviewed: Na: 153, Mg: 2.6, CBGS: 159-174 (inpatient orders for glycemic control are 10 units insulin glargine daily, 0-20 units insulin aspart every 4 hours).   Diet Order:   Diet Order            Diet NPO time specified  Diet effective midnight        DIET - DYS 1 Room service appropriate? Yes; Fluid consistency: Thin  Diet effective now              EDUCATION NEEDS:   Not appropriate for education at this time  Skin:  Skin Assessment: Skin Integrity Issues: Skin Integrity Issues:: Incisions Incisions: closed lt back  Last BM:  06/05/19 (via rectal tube)  Height:   Ht Readings from Last 1 Encounters:  05/23/19 5\' 4"  (1.626 m)    Weight:   Wt Readings from Last 1 Encounters:  06/05/19 100.1 kg    Ideal Body Weight:  54.5 kg  BMI:  Body mass index is 37.88 kg/m.  Estimated Nutritional Needs:   Kcal:  1700-1900  Protein:  95-110 grams  Fluid:  1.7-1.9 L  Laniyah Rosenwald A. Jimmye Norman, RD, LDN, [place] Registered Dietitian II Certified Diabetes Care and Education Specialist Pager: [phone] After hours Pager: [phone]

## 2019-06-07 ENCOUNTER — Encounter (HOSPITAL_COMMUNITY): Payer: Self-pay | Admitting: Certified Registered"

## 2019-06-07 ENCOUNTER — Encounter (HOSPITAL_COMMUNITY)
Admission: EM | Disposition: A | Discharge status: Hospice | Payer: Self-pay | Source: Home / Self Care | Attending: Internal Medicine

## 2019-06-07 DIAGNOSIS — K8511 Biliary acute pancreatitis with uninfected necrosis: Secondary | ICD-10-CM

## 2019-06-07 LAB — BASIC METABOLIC PANEL
Anion gap: 6 (ref 5–15)
Anion gap: 9 (ref 5–15)
BUN: 52 mg/dL — ABNORMAL HIGH (ref 8–23)
BUN: 55 mg/dL — ABNORMAL HIGH (ref 8–23)
CO2: 22 mmol/L (ref 22–32)
CO2: 24 mmol/L (ref 22–32)
Calcium: 8.3 mg/dL — ABNORMAL LOW (ref 8.9–10.3)
Calcium: 8.4 mg/dL — ABNORMAL LOW (ref 8.9–10.3)
Chloride: 120 mmol/L — ABNORMAL HIGH (ref 98–111)
Chloride: 121 mmol/L — ABNORMAL HIGH (ref 98–111)
Creatinine, Ser: 1.24 mg/dL — ABNORMAL HIGH (ref 0.44–1.00)
Creatinine, Ser: 1.47 mg/dL — ABNORMAL HIGH (ref 0.44–1.00)
GFR calc Af Amer: 39 mL/min — ABNORMAL LOW (ref >60)
GFR calc Af Amer: 49 mL/min — ABNORMAL LOW (ref >60)
GFR calc non Af Amer: 34 mL/min — ABNORMAL LOW (ref >60)
GFR calc non Af Amer: 42 mL/min — ABNORMAL LOW (ref >60)
Glucose, Bld: 150 mg/dL — ABNORMAL HIGH (ref 70–99)
Glucose, Bld: 260 mg/dL — ABNORMAL HIGH (ref 70–99)
Potassium: 3.7 mmol/L (ref 3.5–5.1)
Potassium: 3.9 mmol/L (ref 3.5–5.1)
Sodium: 151 mmol/L — ABNORMAL HIGH (ref 135–145)
Sodium: 151 mmol/L — ABNORMAL HIGH (ref 135–145)

## 2019-06-07 LAB — CBC
HCT: 21.5 % — ABNORMAL LOW (ref 36.0–46.0)
HCT: 31.1 % — ABNORMAL LOW (ref 36.0–46.0)
Hemoglobin: 6.7 g/dL — CL (ref 12.0–15.0)
Hemoglobin: 9.7 g/dL — ABNORMAL LOW (ref 12.0–15.0)
MCH: 30.2 pg (ref 26.0–34.0)
MCH: 30.4 pg (ref 26.0–34.0)
MCHC: 31.2 g/dL (ref 30.0–36.0)
MCHC: 31.2 g/dL (ref 30.0–36.0)
MCV: 96.8 fL (ref 80.0–100.0)
MCV: 97.5 fL (ref 80.0–100.0)
Platelets: 175 10*3/uL (ref 150–400)
Platelets: 199 10*3/uL (ref 150–400)
RBC: 2.22 MIL/uL — ABNORMAL LOW (ref 3.87–5.11)
RBC: 3.19 MIL/uL — ABNORMAL LOW (ref 3.87–5.11)
RDW: 16.6 % — ABNORMAL HIGH (ref 11.5–15.5)
RDW: 16.7 % — ABNORMAL HIGH (ref 11.5–15.5)
WBC: 12.4 10*3/uL — ABNORMAL HIGH (ref 4.0–10.5)
WBC: 17.4 10*3/uL — ABNORMAL HIGH (ref 4.0–10.5)
nRBC: 0.3 % — ABNORMAL HIGH (ref 0.0–0.2)
nRBC: 0.4 % — ABNORMAL HIGH (ref 0.0–0.2)

## 2019-06-07 LAB — PREPARE RBC (CROSSMATCH)

## 2019-06-07 LAB — ABO/RH: ABO/RH(D): O POS

## 2019-06-07 LAB — PROTIME-INR
INR: 1.6 — ABNORMAL HIGH (ref 0.8–1.2)
Prothrombin Time: 19.1 seconds — ABNORMAL HIGH (ref 11.4–15.2)

## 2019-06-07 LAB — MAGNESIUM
Magnesium: 2.5 mg/dL — ABNORMAL HIGH (ref 1.7–2.4)
Magnesium: 2.6 mg/dL — ABNORMAL HIGH (ref 1.7–2.4)

## 2019-06-07 LAB — HEPARIN LEVEL (UNFRACTIONATED): Heparin Unfractionated: 0.22 IU/mL — ABNORMAL LOW (ref 0.30–0.70)

## 2019-06-07 LAB — GLUCOSE, CAPILLARY
Glucose-Capillary: 130 mg/dL — ABNORMAL HIGH (ref 70–99)
Glucose-Capillary: 131 mg/dL — ABNORMAL HIGH (ref 70–99)
Glucose-Capillary: 140 mg/dL — ABNORMAL HIGH (ref 70–99)
Glucose-Capillary: 161 mg/dL — ABNORMAL HIGH (ref 70–99)
Glucose-Capillary: 168 mg/dL — ABNORMAL HIGH (ref 70–99)
Glucose-Capillary: 254 mg/dL — ABNORMAL HIGH (ref 70–99)
Glucose-Capillary: 317 mg/dL — ABNORMAL HIGH (ref 70–99)

## 2019-06-07 SURGERY — CANCELLED PROCEDURE

## 2019-06-07 MED ORDER — SODIUM CHLORIDE 0.9% IV SOLUTION
Freq: Once | INTRAVENOUS | Status: AC
  Administered 2019-06-07: 03:00:00 via INTRAVENOUS

## 2019-06-07 SURGICAL SUPPLY — 14 items

## 2019-06-07 NOTE — Care Management Important Message (Signed)
Important Message  Patient Details  Name: Melanie [name] MRN: [id number] Date of Birth: April 02, 1941   Medicare Important Message Given:  Yes    [name] 06/07/2019, 12:44 PM

## 2019-06-07 NOTE — Progress Notes (Signed)
Pt refused vest therapy at this time. Pt did perform flutter valve x10 with good effort.

## 2019-06-07 NOTE — Progress Notes (Addendum)
Daily Rounding Note  06/07/2019, 10:10 AM  LOS: 15 days   SUBJECTIVE:   Chief complaint: dysphagia, difficulty placing Cortrack feeding tube.  Biliary pancreatitis.  GI bleed.  Acute on chronic resp failure.      Pt's EGD cancelled due to worsening resp status.  Anesthesia felt she would need intubation for procedure and concerned it might be difficult to take her off vent post completing procedure.  They were also concerned by drop in Hgb for which she received 1 U PRBC in wee hours this AM.   Overall pt feels worse, "hurt all over".  No nausea.   Stool in the flexi-seal catheter remians liquid and deep brown but no hint of burgundy, maroon coloration.     OBJECTIVE:         Vital signs in last 24 hours:    Temp:  [97.5 F (36.4 C)-98.4 F (36.9 C)] 97.6 F (36.4 C) (06/16 0743) Pulse Rate:  [46-97] 88 (06/16 0958) Resp:  [14-26] 15 (06/16 0800) BP: (95-137)/(63-91) 130/84 (06/16 0958) SpO2:  [94 %-100 %] 99 % (06/16 0800) FiO2 (%):  [30 %-45 %] 30 % (06/16 0750) Weight:  [96.3 kg] 96.3 kg (06/16 0330) Last BM Date: 06/06/19 Filed Weights   06/05/19 0347 06/05/19 2340 06/07/19 0330  Weight: 96.2 kg 100.1 kg 96.3 kg   General: tired, ill looking.  Alert, comfortable   Heart: RRR Chest: clear bil but reduced BS.  Wet cough.  Dyspnea with speaking.   Abdomen: soft, obese, NT, active BS,   Extremities: non-pitting swelling in left > right hands/forearms.   Neuro/Psych:  Pleasant, appropriate.  Moves all 4s.  Calm.    Intake/Output from previous day: 06/15 0701 - 06/16 0700 In: 1216 [P.O.:100; I.V.:816; IV Piggyback:300] Out: 1120 [Urine:1120]  Intake/Output this shift: Total I/O In: 315 [Blood:315] Out: -   Lab Results: Recent Labs    06/05/19 0517 06/06/19 0620 06/06/19 2337  WBC 14.6* 12.0* 12.4*  HGB 8.0* 8.2* 6.7*  HCT 25.7* 26.6* 21.5*  PLT 193 223 175   BMET Recent Labs    06/06/19 0620  06/06/19 0904 06/06/19 2337  NA 153* 153* 151*  K 3.5 4.1 3.7  CL 120* 121* 120*  CO2 22 20* 22  GLUCOSE 222* 188* 260*  BUN 51* 52* 55*  CREATININE 1.50* 1.59* 1.47*  CALCIUM 8.3* 8.3* 8.4*   LFT Recent Labs    06/05/19 0517 06/06/19 0904  PROT  --  5.1*  ALBUMIN 3.3* 2.9*  AST  --  34  ALT  --  54*  ALKPHOS  --  76  BILITOT  --  0.8   PT/INR Recent Labs    06/06/19 0620 06/06/19 2337  LABPROT 18.4* 19.1*  INR 1.6* 1.6*   Hepatitis Panel No results for input(s): HEPBSAG, HCVAB, HEPAIGM, HEPBIGM in the last 72 hours.  Studies/Results: Dg Chest 1 View  Result Date: 06/05/2019 CLINICAL DATA:  Left-sided thoracentesis.  Follow-up. EXAM: CHEST  1 VIEW COMPARISON:  June 04, 2019 FINDINGS: The left-sided pleural effusion is smaller. A small effusion with underlying atelectasis remains. No pneumothorax after thoracentesis. No other changes. IMPRESSION: The left-sided pleural effusion is smaller in the interval. No pneumothorax after thoracentesis. Electronically Signed   By: Dorise Bullion III M.D   On: 06/05/2019 12:58   US Thoracentesis Asp Pleural Space W/img Guide  Result Date: 06/05/2019 INDICATION: Patient with history of CHF, bilateral pleural effusions; request made for diagnostic  and therapeutic left thoracentesis. EXAM: ULTRASOUND GUIDED DIAGNOSTIC AND THERAPEUTIC LEFT THORACENTESIS MEDICATIONS: None COMPLICATIONS: None immediate. PROCEDURE: An ultrasound guided thoracentesis was thoroughly discussed with the patient's daughter (patient with history of dementia) and questions answered. The benefits, risks, alternatives and complications were also discussed. The patient's daughter understands and wishes to proceed with the procedure. Written consent was obtained. Ultrasound was performed to localize and mark an adequate pocket of fluid in the left chest. The area was then prepped and draped in the normal sterile fashion. 1% Lidocaine was used for local anesthesia. Under  ultrasound guidance a 6 Fr Safe-T-Centesis catheter was introduced. Thoracentesis was performed. The catheter was removed and a dressing applied. FINDINGS: A total of approximately 320 cc of yellow fluid was removed. Samples were sent to the laboratory as requested by the clinical team. The left pleural effusion was small by today's ultrasound. IMPRESSION: Successful ultrasound guided diagnostic and therapeutic left thoracentesis yielding 320 cc of pleural fluid. Read by: Rowe Robert, PA-C Electronically Signed   By: Jerilynn Mages.  Shick M.D.   On: 06/05/2019 12:50   Scheduled Meds:  feeding supplement (ENSURE ENLIVE)  237 mL Oral TID BM   feeding supplement (PRO-STAT SUGAR FREE 64)  30 mL Per Tube BID BM   insulin aspart  0-20 Units Subcutaneous Q4H   insulin glargine  10 Units Subcutaneous QHS   ipratropium-albuterol  3 mL Nebulization TID   methylPREDNISolone (SOLU-MEDROL) injection  60 mg Intravenous Daily   metoprolol tartrate  25 mg Oral Q6H   multivitamin with minerals  1 tablet Oral Daily   pantoprazole  40 mg Oral BID   sodium chloride flush  10-40 mL Intracatheter Q12H   Continuous Infusions:  sodium chloride 20 mL/hr at 06/06/19 1431   dextrose 40 mL/hr at 06/06/19 2158   meropenem (MERREM) IV 1 g (06/07/19 0107)   PRN Meds:.acetaminophen **OR** acetaminophen, HYDROmorphone (DILAUDID) injection, lidocaine (PF), LORazepam, metoprolol tartrate, ondansetron **OR** ondansetron (ZOFRAN) IV, sodium chloride flush   ASSESMENT:   *  Dysphagia.  RD and radiology unable to pass NG feeding tube.  S/P dilation esoph stricture 2016.   EGD with FT placement cancelled.    *   Acute, biliary, pancreatitis.  Too ill to consider lap chole, surgery has not seen pt.  CTAP 6/1 and 6/7.  Lipase approaching normal and LFTs normalized as of 6/12.   *   Normocytic anemia.  Hgb drop 8.2 >> 6.7 from yest AM to midnight.  S/p 1 U PRBC, await fup HH.  This was 1st transfusion during [place] admission.  Not iron, B12, Folate deficient.        *   Chronic AC, Coumadin stopped in setting GIB, Heparin has been in place but DCd overnight due to acute drop Hgb.  .    *   GI bleed in setting of INR 8, resolved.  On Protonix 40 mg po BID.     *   Pleural effusions.  S/p 3 thoracentesis Oxygen sats ok, 94 to 100%.    *   AKI, CKD.     PLAN   *   Given the drop in Hgb, prob best to hold restart IV heparin for now which was stopped as of 0050 today.    *  Await post transfusion CBC.    *  Resume puree diet, thin liquids per SLP guidelines.  Encourage po.  May be able to avoid FT placement.  TNA is an option though less than ideal.  Azucena Freed  06/07/2019, 10:10 AM Phone [phone]     Attending Physician Note   I have taken an interval history, reviewed the chart and examined the patient. I agree with the Advanced Practitioner's note, impression and recommendations.   Pulmonary status not stable for EGD/sedation. Would require intubation for sedation and therefore EGD was cancelled. Worsening anemia without evidence of overt GI bleed  Slowly resolving acute biliary pancreatitis Cholelithiaisis  Push oral feedings, oral nutrition supplements. TNA is next option, defer to primary service.  Trend CBC Recommend holding heparin until anemia stabilized, defer to primary service Please re-consult GI when respiratory status has significantly improved to further evaluate for esophageal stricture, other esophageal process Cholecystectomy when pancreatitis resolved and respiratory status improved   Lucio Edward, MD [facility]

## 2019-06-07 NOTE — Plan of Care (Signed)
Care plan progressing

## 2019-06-07 NOTE — Progress Notes (Signed)
CRITICAL VALUE ALERT  Critical Value: hgb 6.7  Date & Time Notied:  06/07/19 @ 0030  Provider Notified: Schorr   Orders Received/Actions taken:  See new orders

## 2019-06-07 NOTE — Progress Notes (Signed)
PROGRESS NOTE    Melanie [name]  DXA:[id number] DOB: [birth year] DOA: 05/22/2019 PCP: [name], MD   Brief Narrative:  78 year old WF PMHx dementia, diabetes type 2 uncontrolled with complication, DM neuropathy, HLD, essential HTN, A. fib on warfarin, dysphagia, cholelithiasis, colon polyps,  Admitted on 05/22/2019, presented with complaint of epigastric abdominal pain nausea and vomiting, was found to have acute pancreatitis as well as sepsis of unknown origin. Currently further plan is continue current treatment.    Subjective: 6/16 A/O x3 (does not know why).  Her whole lunch is sitting on the tray today has not eaten anything.   Assessment & Plan:   Principal Problem:   Acute pancreatitis Active Problems:   HTN (hypertension)   DM (diabetes mellitus) type II controlled, neurological manifestation (HCC)   Afib (HCC)   SOB (shortness of breath)   Abdominal discomfort   Acute respiratory failure with hypoxia (HCC)   Cholelithiasis   Pleural effusion on left   Acute on chronic diastolic heart failure (HCC)   Dysphasia   Sepsis unknown etiology - Intra-abdominal source Vs CAP vs aspiration pneumonia -Negative cultures on admission - Patiently treated with IV vancomycin and Zosyn--> IV Unasyn - 6/6 CXR moderate bilateral pleural effusion with consolidation -ABG shows chronic compensated respiratory alkalosis along with metabolic acidosis and high anion gap -Completed 9 days of meropenem.  Patient afebrile much more alert, decreased HF [place] will DC antibiotic for now, have a low bar for restarting given her frail condition.      Acute gallstone pancreatitis -Per GI note 6/9 patient will require surgical referral/consultation to consider a cholecystectomy prior to discharge as result of previously felt pancreatitis due to likely biliary/gallstone disease that occurred upon her initial admission.  Once patient more stable. - MRCP negative for CBD dilation or CBD  stone  Acute on CKD stage III (baseline Cr 1.13 ) -Multifactorial sepsis, diabetes type 2 uncontrolled, overdiuresis.  BUN remains elevated (GI bleed?) - Patient remains intravascularly depleted secondary to hypoalbuminemia. - 6/12 Albumin 50 g - Continue gentle hydration D5W 75ml/hr unable to place core track tube or NG tube by IR  Recent Labs  Lab 06/04/19 0746 06/05/19 0517 06/06/19 0620 06/06/19 0904 06/06/19 2337  CREATININE 1.55* 1.59* 1.50* 1.59* 1.47*  -Creatinine continues to mildly improved.   Acute respiratory failure with hypoxia -Multifactorial Bilateral Pleural Effusion, CAP vs atelectasis vs mucous plugging - 6/15 continues to make mild improvements, HFNC 25 L/min: FiO2 45%  - PCXR 6/13 increasing left pleural effusion  Bilateral pleural effusion -6/8 s/p RIGHT thoracentesis/6/9 s/p LEFT thoracentesis see below; work-up showed transudate of effusion -6/13 PCXR shows enlarging LEFT pleural effusion - 6/14 s/p LEFT thoracentesis; 320 cc: Fluid sent to lab for pathology   CAP vs aspiration pneumonia vs atelectasis vs mucous plugging - Patient unable to participate with flutter valve - Physiotherapy vest - 6/15 decrease Solu-Medrol 60 mg daily -DuoNeb every 6 hours - Restart hypertonic nebulization on 6/16  -BiPAP held secondary to drop in BP  Acute on chronic diastolic CHF -2/09 titrate Cardizem drip off: Will help reduce fluid load - Metoprolol IV PRN - Metoprolol 25 mg every 6 -Strict in and out +8.5 L -Daily weight Filed Weights   06/05/19 0347 06/05/19 2340 06/07/19 0330  Weight: 96.2 kg 100.1 kg 96.3 kg  . Chronic atrial fibrillation with RVR -We will start patient on heparin drip today per GI recommendation from 6/9.  Monitor for any bleeding. -6/15 continue to hold heparin per  GI note will place core track tube endoscopically on 6/16 .  Hypotensive - Secondary to patient's poor nutritional status i.e. hypovolemic - 6/15 Albumin 50 g x 1 -  Maintain MAP> 65 if cannot maintain above 65 start levophed  GI bleed? - Multifactorial, secondary to supratherapeutic INR, hemorrhagic. - Occult blood pending - Trend hemoglobin Recent Labs  Lab 06/03/19 0301 06/04/19 0313 06/05/19 0517 06/06/19 0620 06/06/19 2337  HGB 7.9* 8.6* 8.0* 8.2* 6.7*  -6/16 transfuse 1 unit PRBC  - Trend INR Recent Labs  Lab 06/03/19 0301 06/04/19 0313 06/05/19 0957 06/06/19 0620 06/06/19 2337  INR 1.3* 1.7* 1.6* 1.6* 1.6*  - Protonix 40 mg twice daily  supratherapeutic INR -Resolved   Acute metabolic encephalopathy/dementia/CVA? - CT head unremarkable, and EEG unremarkable.  However patient has patent foramen ovale by echocardiogram with bubble study. - If within the next 24 to 48 hours all metabolic abnormalities have been corrected and still no improvement in patient's cognition will obtain MRI   Diabetes type 2 uncontrolled with complication -3/5 hemoglobin A1c = 7.5  -6/13 start Lantus 10 units nightly - Resistant SSI  Dysphagia -The patient cleared for thin liquids rarely takes any fluids and at all even when encouraged.  Patient definitely is not meeting her daily caloric intake requirements. - 6/12 core track team has attempted to place core track multiple attempts unsuccessfully - 6/13 IR attempted to place NG tube multiple attempts unsuccessfully: Discussed with Dr. Oletta Lamas IR what the difficulty was not passing tube he believes the esophagus inflamed/swollen/strictures.  Recommends we reconsult GI and asked them to try to pass tube endoscopically. - [facility] Dr. Valarie Merino Mansouraty last saw patient on 6/9 will reconsult in a.m. to discuss endoscopic placement of NG tube as patient without adequate nutrition or hydration.  -6/14 spoke with Dr.  Juanita Craver from [facility] discussed patient and they agreed with attempt to place NG tube endoscopically tomorrow 6/15 by Dr. Fuller Plan. - 6/16 GI unable to place NG tube secondary to GI bleed  overnight, and anesthesia feeling that patient would either expire on the table or never come off of the vent.   Obesity Body mass index is 35.68 kg/m.  Continue to monitor for now.   Hypokalemia - Potassium goal> 4   Hypernatremia - Multifactorial poor p.o. intake, dehydration -Patient intravascularly dehydrated secondary to hypoalbuminemia - Slowly correcting with D5W    Goals of care - 6/14 attempted to call Erasmo Downer patient's son to update him on patient's plan of care.  Mailbox on the phone was full unable to leave message. -6/16 Placed consult to palliative care discuss with family making patient DNR/comfort care.  Spoke with Erasmo Downer patient's son and went over patient's entire specialization and poor prognosis.  Counseled son that most likely patient would not survive hospitalization.    DVT prophylaxis: SCD Code Status: Full Family Communication: None Disposition Plan: TBD   Consultants:  [facility] Neurology    Procedures/Significant Events:  6/4 EEG: Abnormal EEG due to the presence of general background slowing and triphasic waves.  This is seen most commonly in encephalopathic states, most commonly, but not limited to, metabolic encephalopathies 6/5 echocardiogram:-The left ventricle has hyperdynamic systolic function, with an ejection fraction of >65%.-The cavity size was decreased.  -Left Atrium: moderate to severely enlarged . -Right Atrium: mildly dilated. pressure is estimated at 10 mmHg. 6/8 RIGHT thoracentesis.: 1.2 L aspirated 6/9 LEFT thoracentesis; 686ml 6/11 PCXR:-Improved airspace disease in the RIGHT lung. -  Persistent LEFT effusion and basilar atelectasis. -Hiatal hernia 6/12 echocardiogram with bubble study: Abnormal bubble contrast study.  Interstitial shunt noted suggestive of patent foramen ovale -LVEF 55 - 60% 6/13 PCXR: Lucency left lower mediastinum corresponds to known hiatal hernia -Moderately large LEFT  pleural effusion has increased in size with worsening atelectasis or consolidation in the left lung -Mild patchy airspace opacity persists right lung  6/14 LEFT thoracentesis: 320 cc 6/16 transfuse 1 unit PRBC  I have personally reviewed and interpreted all radiology studies and my findings are as above.  VENTILATOR SETTINGS:    Cultures   Antimicrobials: Anti-infectives (From admission, onward)   Start     Ordered Stop   05/29/19 1000  meropenem (MERREM) 500 mg in sodium chloride 0.9 % 100 mL IVPB     05/29/19 0805     05/28/19 1700  Ampicillin-Sulbactam (UNASYN) 3 g in sodium chloride 0.9 % 100 mL IVPB  Status:  Discontinued     05/28/19 0735 05/29/19 0805   05/27/19 1200  Ampicillin-Sulbactam (UNASYN) 3 g in sodium chloride 0.9 % 100 mL IVPB  Status:  Discontinued     05/27/19 1044 05/28/19 0735   05/23/19 1600  piperacillin-tazobactam (ZOSYN) IVPB 3.375 g  Status:  Discontinued     05/23/19 0933 05/27/19 1044   05/23/19 0800  piperacillin-tazobactam (ZOSYN) IVPB 3.375 g     05/23/19 0141 05/23/19 0911   05/23/19 0100  piperacillin-tazobactam (ZOSYN) IVPB 3.375 g     05/23/19 0059 05/23/19 0241       Devices    LINES / TUBES:      Continuous Infusions: . sodium chloride 20 mL/hr at 06/06/19 1431  . dextrose 40 mL/hr at 06/06/19 2158  . meropenem (MERREM) IV 1 g (06/07/19 0107)     Objective: Vitals:   06/07/19 0424 06/07/19 0743 06/07/19 0750 06/07/19 0800  BP: 137/71 131/80 131/80 130/82  Pulse: 91 89 85 86  Resp: (!) 21 18 20 15   Temp: 97.7 F (36.5 C) 97.6 F (36.4 C)    TempSrc: Oral Oral    SpO2: 100% 99% 98% 99%  Weight:      Height:        Intake/Output Summary (Last 24 hours) at 06/07/2019 0931 Last data filed at 06/07/2019 0743 Gross per 24 hour  Intake 1315.36 ml  Output 1120 ml  Net 195.36 ml   Filed Weights   06/05/19 0347 06/05/19 2340 06/07/19 0330  Weight: 96.2 kg 100.1 kg 96.3 kg    Physical Exam:  General: A/O x3 (does not  know why) positive acute respiratory distress Eyes: negative scleral hemorrhage, negative anisocoria, negative icterus ENT: Negative Runny nose, negative gingival bleeding, Neck:  Negative scars, masses, torticollis, lymphadenopathy, JVD Lungs: Diminished breath sounds diffusely, but much clearer, without wheezes or crackles Cardiovascular: Regular rate and rhythm without murmur gallop or rub normal S1 and S2 Abdomen: Obese, negative abdominal pain, nondistended, positive soft, bowel sounds, no rebound, no ascites, no appreciable mass Extremities: No significant cyanosis, clubbing, anasarca Skin: Negative rashes, lesions, ulcers Psychiatric:  Negative depression, negative anxiety, negative fatigue, negative mania  Central nervous system:  Cranial nerves II through XII intact, tongue/uvula midline, all extremities muscle strength 5/5, sensation intact negative dysarthria, negative expressive aphasia, negative receptive aphasia.      Data Reviewed: Care during the described time interval was provided by me .  I have reviewed this patient's available data, including medical history, events of note, physical examination, and all test results as part  of my evaluation.   CBC: Recent Labs  Lab 06/01/19 0641 06/03/19 0301 06/04/19 0313 06/05/19 0517 06/06/19 0620 06/06/19 2337  WBC 27.0* 19.1* 16.7* 14.6* 12.0* 12.4*  NEUTROABS 24.9*  --   --   --   --   --   HGB 9.6* 7.9* 8.6* 8.0* 8.2* 6.7*  HCT 29.8* 25.2* 27.3* 25.7* 26.6* 21.5*  MCV 92.8 97.7 95.8 96.6 97.8 96.8  PLT 251 90* 168 193 223 132   Basic Metabolic Panel: Recent Labs  Lab 06/04/19 0313 06/04/19 0746 06/05/19 0517 06/06/19 0620 06/06/19 0904 06/06/19 2337  NA  --  154* 154* 153* 153* 151*  K  --  4.6 3.6 3.5 4.1 3.7  CL  --  125* 124* 120* 121* 120*  CO2  --  18* 22 22 20* 22  GLUCOSE  --  237* 207* 222* 188* 260*  BUN  --  46* 49* 51* 52* 55*  CREATININE  --  1.55* 1.59* 1.50* 1.59* 1.47*  CALCIUM  --  8.4*  8.6* 8.3* 8.3* 8.4*  MG 2.5*  --  2.5* 2.5* 2.6* 2.5*   GFR: Estimated Creatinine Clearance: 36.1 mL/min (A) (by C-G formula based on SCr of 1.47 mg/dL (H)). Liver Function Tests: Recent Labs  Lab 05/31/19 1028 06/01/19 0641 06/03/19 0301 06/04/19 0746 06/05/19 0517 06/06/19 0904  AST 22 19 21 24   --  34  ALT 22 22 32 25  --  54*  ALKPHOS 68 59 93 75  --  76  BILITOT 0.6 0.7 0.8 1.5*  --  0.8  PROT 5.7* 5.4* 4.9* 5.3*  --  5.1*  ALBUMIN 2.8* 2.7* 2.2* 2.8* 3.3* 2.9*   Recent Labs  Lab 06/03/19 0301 06/06/19 0904  LIPASE 53* 30  AMYLASE 79  --    Recent Labs  Lab 06/04/19 0313  AMMONIA 33   Coagulation Profile: Recent Labs  Lab 06/03/19 0301 06/04/19 0313 06/05/19 0957 06/06/19 0620 06/06/19 2337  INR 1.3* 1.7* 1.6* 1.6* 1.6*   Cardiac Enzymes: No results for input(s): CKTOTAL, CKMB, CKMBINDEX, TROPONINI in the last 168 hours. BNP (last 3 results) No results for input(s): PROBNP in the last 8760 hours. HbA1C: No results for input(s): HGBA1C in the last 72 hours. CBG: Recent Labs  Lab 06/06/19 2024 06/07/19 0047 06/07/19 0400 06/07/19 0435 06/07/19 0755  GLUCAP 263* 254* 161* 140* 130*   Lipid Profile: No results for input(s): CHOL, HDL, LDLCALC, TRIG, CHOLHDL, LDLDIRECT in the last 72 hours. Thyroid Function Tests: No results for input(s): TSH, T4TOTAL, FREET4, T3FREE, THYROIDAB in the last 72 hours. Anemia Panel: No results for input(s): VITAMINB12, FOLATE, FERRITIN, TIBC, IRON, RETICCTPCT in the last 72 hours. Urine analysis:    Component Value Date/Time   COLORURINE YELLOW 05/29/2019 1115   APPEARANCEUR CLOUDY (A) 05/29/2019 1115   LABSPEC 1.023 05/29/2019 1115   PHURINE 5.0 05/29/2019 1115   GLUCOSEU 150 (A) 05/29/2019 1115   HGBUR SMALL (A) 05/29/2019 1115   BILIRUBINUR NEGATIVE 05/29/2019 1115   BILIRUBINUR negative 11/20/2017 [place] 05/29/2019 1115   PROTEINUR NEGATIVE 05/29/2019 1115   UROBILINOGEN 0.2 11/20/2017  1447   UROBILINOGEN 1.0 11/30/2016 1856   NITRITE NEGATIVE 05/29/2019 1115   LEUKOCYTESUR NEGATIVE 05/29/2019 1115   Sepsis Labs: @LABRCNTIP (procalcitonin:4,lacticidven:4)  ) Recent Results (from the past 240 hour(s))  Culture, blood (routine x 2)     Status: None   Collection Time: 05/29/19 10:26 AM   Specimen: BLOOD  Result Value Ref Range Status  Specimen Description BLOOD LEFT ANTECUBITAL  Final   Special Requests AEROBIC BOTTLE ONLY Blood Culture adequate volume  Final   Culture   Final    NO GROWTH 5 DAYS Performed at [facility], [address] [place], [place] [address]    Report Status 06/03/2019 FINAL  Final  Culture, blood (routine x 2)     Status: None   Collection Time: 05/29/19 10:35 AM   Specimen: BLOOD LEFT ARM  Result Value Ref Range Status   Specimen Description BLOOD LEFT ARM  Final   Special Requests AEROBIC BOTTLE ONLY Blood Culture adequate volume  Final   Culture   Final    NO GROWTH 5 DAYS Performed at [facility], [place] [address], [place],  [address]    Report Status 06/03/2019 FINAL  Final  SARS Coronavirus 2 (CEPHEID - Performed in [facility] hospital lab), Hosp Order     Status: None   Collection Time: 05/29/19 10:43 AM   Specimen: Nasopharyngeal Swab  Result Value Ref Range Status   SARS Coronavirus 2 NEGATIVE NEGATIVE Final    Comment: (NOTE) If result is NEGATIVE SARS-CoV-2 target nucleic acids are NOT DETECTED. The SARS-CoV-2 RNA is generally detectable in upper and lower  respiratory specimens during the acute phase of infection. The lowest  concentration of SARS-CoV-2 viral copies this assay can detect is 250  copies / mL. A negative result does not preclude SARS-CoV-2 infection  and should not be used as the sole basis for treatment or other  patient management decisions.  A negative result may occur with  improper specimen collection / handling, submission of specimen other  than nasopharyngeal swab, presence of  viral mutation(s) within the  areas targeted by this assay, and inadequate number of viral copies  (<250 copies / mL). A negative result must be combined with clinical  observations, patient history, and epidemiological information. If result is POSITIVE SARS-CoV-2 target nucleic acids are DETECTED. The SARS-CoV-2 RNA is generally detectable in upper and lower  respiratory specimens dur ing the acute phase of infection.  Positive  results are indicative of active infection with SARS-CoV-2.  Clinical  correlation with patient history and other diagnostic information is  necessary to determine patient infection status.  Positive results do  not rule out bacterial infection or co-infection with other viruses. If result is PRESUMPTIVE POSTIVE SARS-CoV-2 nucleic acids MAY BE PRESENT.   A presumptive positive result was obtained on the submitted specimen  and confirmed on repeat testing.  While 2019 novel coronavirus  (SARS-CoV-2) nucleic acids may be present in the submitted sample  additional confirmatory testing may be necessary for epidemiological  and / or clinical management purposes  to differentiate between  SARS-CoV-2 and other Sarbecovirus currently known to infect humans.  If clinically indicated additional testing with an alternate test  methodology [id number]) is advised. The SARS-CoV-2 RNA is generally  detectable in upper and lower respiratory sp ecimens during the acute  phase of infection. The expected result is Negative. Fact Sheet for Patients:  StrictlyIdeas.no Fact Sheet for Healthcare Providers: BankingDealers.co.za This test is not yet approved or cleared by the [place] FDA and has been authorized for detection and/or diagnosis of SARS-CoV-2 by FDA under an Emergency Use Authorization (EUA).  This EUA will remain in effect (meaning this test can be used) for the duration of the COVID-19 declaration under Section  564(b)(1) of the Act, 21 U.S.C. section 360bbb-3(b)(1), unless the authorization is terminated or revoked  sooner. Performed at [facility], [place] [address] [place], [place] [address]   Gram stain     Status: None   Collection Time: 05/30/19  9:23 AM   Specimen: Pleural, Right; Pleural Fluid  Result Value Ref Range Status   Specimen Description PLEURAL RIGHT  Final   Special Requests NONE  Final   Gram Stain   Final    FEW WBC PRESENT,BOTH PMN AND MONONUCLEAR NO ORGANISMS SEEN Performed at [facility], [place] [address], [place], [place] [address]    Report Status 05/30/2019 FINAL  Final  Culture, body fluid-bottle     Status: None   Collection Time: 05/30/19  9:23 AM   Specimen: Pleura  Result Value Ref Range Status   Specimen Description PLEURAL RIGHT  Final   Special Requests NONE  Final   Culture   Final    NO GROWTH 5 DAYS Performed at [facility] [address] [place], [place] [address]    Report Status 06/04/2019 FINAL  Final  Culture, body fluid-bottle     Status: None   Collection Time: 05/31/19  2:02 PM   Specimen: Pleura  Result Value Ref Range Status   Specimen Description PLEURAL  Final   Special Requests FLUID  Final   Culture   Final    NO GROWTH 5 DAYS Performed at [facility], [address] [place], [place] [address]    Report Status 06/05/2019 FINAL  Final  Gram stain     Status: None   Collection Time: 05/31/19  2:02 PM   Specimen: Pleura  Result Value Ref Range Status   Specimen Description PLEURAL  Final   Special Requests FLUID  Final   Gram Stain   Final    WBC PRESENT,BOTH PMN AND MONONUCLEAR NO ORGANISMS SEEN CYTOSPIN SMEAR Performed at [facility], [address] [place], [place] [address]    Report Status 05/31/2019 FINAL  Final  Respiratory Panel by PCR     Status: None   Collection Time: 06/03/19 10:07 AM   Specimen: Nasopharyngeal Swab; Respiratory  Result Value Ref Range Status   Adenovirus  NOT DETECTED NOT DETECTED Final   Coronavirus 229E NOT DETECTED NOT DETECTED Final    Comment: (NOTE) The Coronavirus on the Respiratory Panel, DOES NOT test for the novel  Coronavirus (2019 nCoV)    Coronavirus HKU1 NOT DETECTED NOT DETECTED Final   Coronavirus NL63 NOT DETECTED NOT DETECTED Final   Coronavirus OC43 NOT DETECTED NOT DETECTED Final   Metapneumovirus NOT DETECTED NOT DETECTED Final   Rhinovirus / Enterovirus NOT DETECTED NOT DETECTED Final   Influenza A NOT DETECTED NOT DETECTED Final   Influenza B NOT DETECTED NOT DETECTED Final   Parainfluenza Virus 1 NOT DETECTED NOT DETECTED Final   Parainfluenza Virus 2 NOT DETECTED NOT DETECTED Final   Parainfluenza Virus 3 NOT DETECTED NOT DETECTED Final   Parainfluenza Virus 4 NOT DETECTED NOT DETECTED Final   Respiratory Syncytial Virus NOT DETECTED NOT DETECTED Final   Bordetella pertussis NOT DETECTED NOT DETECTED Final   Chlamydophila pneumoniae NOT DETECTED NOT DETECTED Final   Mycoplasma pneumoniae NOT DETECTED NOT DETECTED Final    Comment: Performed at [facility], [place]. [address] [place], [place] [address]  SARS Coronavirus 2     Status: None   Collection Time: 06/05/19  9:05 AM  Result Value Ref Range Status   SARS Coronavirus 2 NOT DETECTED NOT DETECTED Final  Comment: (NOTE) SARS-CoV-2 target nucleic acids are NOT DETECTED. The SARS-CoV-2 RNA is generally detectable in upper and lower respiratory specimens during the acute phase of infection.  Negative  results do not preclude SARS-CoV-2 infection, do not rule out co-infections with other pathogens, and should not be used as the sole basis for treatment or other patient management decisions.  Negative results must be combined with clinical observations, patient history, and epidemiological information. The expected result is Not Detected. Fact Sheet for Patients: http://www.biofiredefense.com/wp-content/uploads/2020/03/BIOFIRE-COVID -19-patients.pdf  Fact Sheet for Healthcare Providers: http://www.biofiredefense.com/wp-content/uploads/2020/03/BIOFIRE-COVID -19-hcp.pdf This test is not yet approved or cleared by the [place] and  has been authorized for detection and/or diagnosis of SARS-CoV-2 by FDA under an Emergency Use Authorization (EUA).  This EUA will remain in effec t (meaning this test can be used) for the duration of  the COVID-19 declaration under Section 564(b)(1) of the Act, 21 U.S.C. section 360bbb-3(b)(1), unless the authorization is terminated or revoked sooner. Performed at [facility], [place] [address] [place], [place] [address]   Gram stain     Status: None   Collection Time: 06/05/19 12:49 PM   Specimen: Pleural, Left; Pleural Fluid  Result Value Ref Range Status   Specimen Description PLEURAL LEFT  Final   Special Requests NONE  Final   Gram Stain   Final    FEW WBC PRESENT, PREDOMINANTLY MONONUCLEAR NO ORGANISMS SEEN Performed at [facility], [place] [address] [place], [place] [address]    Report Status 06/06/2019 FINAL  Final         Radiology Studies: Dg Chest 1 View  Result Date: 06/05/2019 CLINICAL DATA:  Left-sided thoracentesis.  Follow-up. EXAM: CHEST  1 VIEW COMPARISON:  June 04, 2019 FINDINGS: The left-sided pleural effusion is smaller. A small effusion with underlying atelectasis remains. No pneumothorax after thoracentesis. No other changes. IMPRESSION: The left-sided pleural effusion is smaller in the interval. No pneumothorax after thoracentesis. Electronically Signed   By: Dorise Bullion III M.D   On: 06/05/2019 12:58   US Thoracentesis Asp Pleural Space W/img Guide  Result Date: 06/05/2019 INDICATION: Patient with history of CHF, bilateral pleural effusions; request made for diagnostic and therapeutic left thoracentesis. EXAM: ULTRASOUND GUIDED DIAGNOSTIC AND THERAPEUTIC LEFT THORACENTESIS MEDICATIONS: None COMPLICATIONS: None immediate. PROCEDURE: An ultrasound  guided thoracentesis was thoroughly discussed with the patient's daughter (patient with history of dementia) and questions answered. The benefits, risks, alternatives and complications were also discussed. The patient's daughter understands and wishes to proceed with the procedure. Written consent was obtained. Ultrasound was performed to localize and mark an adequate pocket of fluid in the left chest. The area was then prepped and draped in the normal sterile fashion. 1% Lidocaine was used for local anesthesia. Under ultrasound guidance a 6 Fr Safe-T-Centesis catheter was introduced. Thoracentesis was performed. The catheter was removed and a dressing applied. FINDINGS: A total of approximately 320 cc of yellow fluid was removed. Samples were sent to the laboratory as requested by the clinical team. The left pleural effusion was small by today's ultrasound. IMPRESSION: Successful ultrasound guided diagnostic and therapeutic left thoracentesis yielding 320 cc of pleural fluid. Read by: Rowe Robert, PA-C Electronically Signed   By: Jerilynn Mages.  Shick M.D.   On: 06/05/2019 12:50        Scheduled Meds: . feeding supplement (ENSURE ENLIVE)  237 mL Oral TID BM  . feeding supplement (PRO-STAT SUGAR FREE 64)  30 mL Per Tube BID BM  . insulin aspart  0-20 Units Subcutaneous Q4H  . insulin glargine  10 Units Subcutaneous QHS  . ipratropium-albuterol  3 mL Nebulization TID  . methylPREDNISolone (SOLU-MEDROL) injection  60 mg Intravenous Daily  . metoprolol tartrate  25 mg Oral Q6H  . multivitamin with minerals  1 tablet Oral Daily  . pantoprazole  40 mg Oral BID  . sodium chloride flush  10-40 mL Intracatheter Q12H   Continuous Infusions: . sodium chloride 20 mL/hr at 06/06/19 1431  . dextrose 40 mL/hr at 06/06/19 2158  . meropenem (MERREM) IV 1 g (06/07/19 0107)     LOS: 15 days   The patient is critically ill with multiple organ systems failure and requires high complexity decision making for assessment  and support, frequent evaluation and titration of therapies, application of advanced monitoring technologies and extensive interpretation of multiple databases. Critical Care Time devoted to patient care services described in this note  Time spent: 40 minutes     Rourke Mcquitty, Geraldo Docker, MD [facility] Pager [phone]  If 7PM-7AM, please contact night-coverage [email or web address] Password [facility] 06/07/2019, 9:31 AM

## 2019-06-07 NOTE — Anesthesia Preprocedure Evaluation (Deleted)
Anesthesia Evaluation    Reviewed: Allergy & Precautions, Patient's Chart, lab work & pertinent test results  Airway        Dental   Pulmonary           Cardiovascular hypertension, Pt. on medications and Pt. on home beta blockers      Neuro/Psych    GI/Hepatic GERD  Medicated,  Endo/Other  diabetes, Type 2, Oral Hypoglycemic Agents  Renal/GU      Musculoskeletal  (+) Arthritis ,   Abdominal   Peds  Hematology   Anesthesia Other Findings   Reproductive/Obstetrics                             Anesthesia Physical Anesthesia Plan  ASA: IV  Anesthesia Plan: MAC   Post-op Pain Management:    Induction: Intravenous  PONV Risk Score and Plan: Propofol infusion  Airway Management Planned: Natural Airway and Nasal Cannula  Additional Equipment: None  Intra-op Plan:   Post-operative Plan:   Informed Consent:   Plan Discussed with: CRNA  Anesthesia Plan Comments:         Anesthesia Quick Evaluation

## 2019-06-08 ENCOUNTER — Inpatient Hospital Stay (HOSPITAL_COMMUNITY): Payer: Medicare HMO

## 2019-06-08 ENCOUNTER — Telehealth: Payer: Self-pay | Admitting: Family Medicine

## 2019-06-08 DIAGNOSIS — Z515 Encounter for palliative care: Secondary | ICD-10-CM

## 2019-06-08 DIAGNOSIS — R609 Edema, unspecified: Secondary | ICD-10-CM

## 2019-06-08 DIAGNOSIS — J90 Pleural effusion, not elsewhere classified: Secondary | ICD-10-CM

## 2019-06-08 LAB — BPAM RBC
Blood Product Expiration Date: 202006182359
ISSUE DATE / TIME: 202006160408
Unit Type and Rh: 9500

## 2019-06-08 LAB — TYPE AND SCREEN
ABO/RH(D): O POS
Antibody Screen: NEGATIVE
Unit division: 0

## 2019-06-08 LAB — HEMOGLOBIN AND HEMATOCRIT, BLOOD
HCT: 32.1 % — ABNORMAL LOW (ref 36.0–46.0)
Hemoglobin: 9.9 g/dL — ABNORMAL LOW (ref 12.0–15.0)

## 2019-06-08 LAB — MAGNESIUM: Magnesium: 2.6 mg/dL — ABNORMAL HIGH (ref 1.7–2.4)

## 2019-06-08 LAB — GLUCOSE, CAPILLARY
Glucose-Capillary: 101 mg/dL — ABNORMAL HIGH (ref 70–99)
Glucose-Capillary: 114 mg/dL — ABNORMAL HIGH (ref 70–99)
Glucose-Capillary: 164 mg/dL — ABNORMAL HIGH (ref 70–99)
Glucose-Capillary: 166 mg/dL — ABNORMAL HIGH (ref 70–99)
Glucose-Capillary: 209 mg/dL — ABNORMAL HIGH (ref 70–99)
Glucose-Capillary: 253 mg/dL — ABNORMAL HIGH (ref 70–99)

## 2019-06-08 LAB — BASIC METABOLIC PANEL
Anion gap: 9 (ref 5–15)
BUN: 54 mg/dL — ABNORMAL HIGH (ref 8–23)
CO2: 22 mmol/L (ref 22–32)
Calcium: 8.5 mg/dL — ABNORMAL LOW (ref 8.9–10.3)
Chloride: 116 mmol/L — ABNORMAL HIGH (ref 98–111)
Creatinine, Ser: 1.49 mg/dL — ABNORMAL HIGH (ref 0.44–1.00)
GFR calc Af Amer: 39 mL/min — ABNORMAL LOW (ref >60)
GFR calc non Af Amer: 34 mL/min — ABNORMAL LOW (ref >60)
Glucose, Bld: 149 mg/dL — ABNORMAL HIGH (ref 70–99)
Potassium: 4.1 mmol/L (ref 3.5–5.1)
Sodium: 147 mmol/L — ABNORMAL HIGH (ref 135–145)

## 2019-06-08 LAB — PROTIME-INR
INR: 1.3 — ABNORMAL HIGH (ref 0.8–1.2)
Prothrombin Time: 15.7 seconds — ABNORMAL HIGH (ref 11.4–15.2)

## 2019-06-08 MED ORDER — METHYLPREDNISOLONE SODIUM SUCC 40 MG IJ SOLR
40.0000 mg | Freq: Every day | INTRAMUSCULAR | Status: DC
  Administered 2019-06-09: 40 mg via INTRAVENOUS
  Filled 2019-06-08: qty 1

## 2019-06-08 MED ORDER — ACETAMINOPHEN 10 MG/ML IV SOLN
1000.0000 mg | Freq: Three times a day (TID) | INTRAVENOUS | Status: AC
  Administered 2019-06-08 – 2019-06-09 (×3): 1000 mg via INTRAVENOUS
  Filled 2019-06-08 (×3): qty 100

## 2019-06-08 MED ORDER — HYDROMORPHONE HCL 1 MG/ML IJ SOLN
0.2500 mg | INTRAMUSCULAR | Status: DC | PRN
  Administered 2019-06-08 – 2019-06-13 (×9): 0.5 mg via INTRAVENOUS
  Filled 2019-06-08 (×8): qty 1

## 2019-06-08 MED ORDER — PROCHLORPERAZINE EDISYLATE 10 MG/2ML IJ SOLN
10.0000 mg | Freq: Four times a day (QID) | INTRAMUSCULAR | Status: DC | PRN
  Administered 2019-06-08 – 2019-06-12 (×9): 10 mg via INTRAVENOUS
  Filled 2019-06-08 (×10): qty 2

## 2019-06-08 MED ORDER — FUROSEMIDE 20 MG PO TABS
20.0000 mg | ORAL_TABLET | Freq: Every day | ORAL | Status: DC
  Administered 2019-06-08 – 2019-06-11 (×4): 20 mg via ORAL
  Filled 2019-06-08 (×4): qty 1

## 2019-06-08 MED ORDER — HEPARIN (PORCINE) 25000 UT/250ML-% IV SOLN
1250.0000 [IU]/h | INTRAVENOUS | Status: DC
  Administered 2019-06-08: 1250 [IU]/h via INTRAVENOUS
  Filled 2019-06-08: qty 250

## 2019-06-08 NOTE — Progress Notes (Signed)
VAST consulted to assess pt's midline in left arm d/t swelling and reported redness.  Pt's entire left arm is edematous including hand; greater than edema in right arm. Right arm measuring 14 cms both above and below elbow, while left arm measuring 15 cms below elbow and 16 cms above elbow. Both hands/arms are cool to touch.  Midline has good blood return and flushes easily without noted increase in swelling. Slight redness noted lateral to midline insertion site. Advised unit RN of findings and suggested MD be contacted regarding ultrasound of arm to rule out DVT.

## 2019-06-08 NOTE — Progress Notes (Signed)
   Daughter Hurman Horn and Son Abeera Flannery will come into the hospital tomorrow for a [place] meeting with their mother.  Florentina Jenny, PA-C Palliative Medicine Pager: [phone]  No charge note.

## 2019-06-08 NOTE — Consult Note (Signed)
Consultation Note Date: 06/08/2019   Patient Name: Melanie [name]  DOB: June 05, 1941  MRN: [id number]  Age / Sex: 78 y.o., female  PCP: [name], MD Referring Physician: Hosie Poisson, MD  Reason for Consultation: Establishing goals of care and Psychosocial/spiritual support  HPI/Patient Profile: 78 y.o. female  with past medical history of DM2, esophageal stricture, CKD 3, obesity who was admitted on 05/22/2019 with gall stone pancreatitis (lipase of 5000).  She has been too unstable for surgery.  Her recovery has been complicated by sepsis/sirs, GIB with elevated INR, recurrent pleural effusions (thoracentesis x 3), afib/aflutter, and esophageal dysphagia with the inability to place an N/G tube for nutrition and medication.  The patient continues to have severe nausea and various types of pain.  Clinical Assessment and Goals of Care:  I have reviewed medical records including EPIC notes, labs and imaging, received report from the care team, assessed the patient and attempted to call her daughter [name])  to discuss diagnosis prognosis, [place], EOL wishes, disposition and options.  In speaking with the patient she names her daughter [name] as her health care decision making surrogate rather than her son.  I introduced Palliative Medicine as specialized medical care for people living with serious illness. It focuses on providing relief from the symptoms and stress of a serious illness.  She is very nauseated and actually spitting up when we talked so it is difficult to get very much information from her.    As far as functional and nutritional status currently she is unable to keep anything down.  Even 1 or two tastes of full liquid diet come back up within 20 min of being swallowed.   She is unable to care for herself and has difficulty with bed mobility at this time.  The patient describes generalized  pain then she adds it is most painful in the center of her chest.  This pain becomes even more severe if she burps or regurgitates.  I discussed symptom control with her RN and attempted to call her daughter [name].  Primary Decision Maker:  NEXT OF KIN.  Patient indicates that she wants [place] in communication with her daughter [name] rather than ask her son to make decisions.    SUMMARY OF RECOMMENDATIONS    Will add compazine as an alternative nausea treatment.  Zofran does not seem to improve her symptoms. Will change her current dilaudid order to add a lower range (0.25 - 0.5 mg) so that it is less likely to oversedate Will give IV tylenol for a 24 hour period to determine if that will improve her comfort. DC'd multivitamin tablet as patient is vomiting POs.    Will continue to reach out to [name] to discuss goals of care and code status.  I believe it will be important for [name] to come into the hospital if possible and see her mother.  Code Status/Advance Care Planning:  Full   Symptom Management:   As above.  Additional Recommendations (Limitations, Scope, Preferences):  Full Scope Treatment  Palliative Prophylaxis:   Frequent Pain Assessment  Psycho-social/Spiritual:   Desire for further Chaplaincy support:  yes  Prognosis:  Patient is at very high risk of decompensation and death secondary to SIRS due to gall stone pancreatitis, recurrent pleural effusions, obesity and now severe deconditioning.     Discharge Planning: To Be Determined      Primary Diagnoses: Present on Admission: . Afib ([place]) . DM (diabetes mellitus) type II controlled, neurological manifestation ([place]) . Acute pancreatitis . HTN (hypertension) . SOB (shortness of breath) . Abdominal discomfort . Acute respiratory failure with hypoxia ([place]) . Cholelithiasis   I have reviewed the medical record, interviewed the patient and family, and examined the patient. The following aspects are  pertinent.  Past Medical History:  Diagnosis Date  . Acute respiratory failure with hypoxia ([place]) 05/2019  . Allergic rhinitis   . Anticoagulated on Coumadin   . Arthritis   . Diabetes mellitus   . GERD (gastroesophageal reflux disease)   . Hyperlipidemia   . Hypertension   . Pain in joint, ankle and foot 09/14/2013   Social History   Socioeconomic History  . Marital status: Widowed    Spouse name: Not on file  . Number of children: 3  . Years of education: Not on file  . Highest education level: Not on file  Occupational History  . Not on file  Social Needs  . Financial resource strain: Not on file  . Food insecurity    Worry: Not on file    Inability: Not on file  . Transportation needs    Medical: Not on file    Non-medical: Not on file  Tobacco Use  . Smoking status: Never Smoker  . Smokeless tobacco: Never Used  Substance and Sexual Activity  . Alcohol use: No  . Drug use: No  . Sexual activity: Not on file  Lifestyle  . Physical activity    Days per week: Not on file    Minutes per session: Not on file  . Stress: Not on file  Relationships  . Social Herbalist on phone: Not on file    Gets together: Not on file    Attends religious service: Not on file    Active member of club or organization: Not on file    Attends meetings of clubs or organizations: Not on file    Relationship status: Not on file  Other Topics Concern  . Not on file  Social History Narrative  . Not on file   Family History  Problem Relation Age of Onset  . Colon cancer Brother 10  . Colon cancer Maternal Aunt 60  . Breast cancer Paternal Grandmother 36  . Breast cancer Maternal Aunt 28  . Diabetes Maternal Aunt   . Heart disease Mother   . Stomach cancer Neg Hx    Scheduled Meds: . feeding supplement (ENSURE ENLIVE)  237 mL Oral TID BM  . feeding supplement (PRO-STAT SUGAR FREE 64)  30 mL Per Tube BID BM  . insulin aspart  0-20 Units Subcutaneous Q4H  . insulin  glargine  10 Units Subcutaneous QHS  . ipratropium-albuterol  3 mL Nebulization TID  . methylPREDNISolone (SOLU-MEDROL) injection  60 mg Intravenous Daily  . metoprolol tartrate  25 mg Oral Q6H  . pantoprazole  40 mg Oral BID  . sodium chloride flush  10-40 mL Intracatheter Q12H   Continuous Infusions: . sodium chloride 20 mL/hr at 06/07/19 1400  . acetaminophen    .  dextrose 40 mL/hr at 06/07/19 1400   PRN Meds:.acetaminophen **OR** acetaminophen, HYDROmorphone (DILAUDID) injection, lidocaine (PF), LORazepam, metoprolol tartrate, ondansetron **OR** ondansetron (ZOFRAN) IV, prochlorperazine, sodium chloride flush Allergies  Allergen Reactions  . Niacin And Related Rash  . Keflex [Cephalexin] Other (See Comments)    Fatigue, sore throat, body aches  . Sausage [Pickled Meat] Other (See Comments)    Drowsiness and dizziness  . Tape Rash    Bandaids    Review of Systems patient to ill to discuss at length.  Complains of nausea, pain, inability to eat.  Physical Exam  Well developed elderly female, awake, alert, coherent, appears very weak, in some distress from nausea and vomiting CV irreg irreg Resp no distress, no w/c/r, decreased breath sounds Abdomen obese, ND, tender in epigastrum, Extremities all 4 appear edematous.  Left arm > right arm.  Vital Signs: BP 137/86 (BP Location: Right Arm)   Pulse 88   Temp 98.2 F (36.8 C) (Axillary)   Resp 17   Ht 5\' 4"  (1.626 m)   Wt 99.8 kg   SpO2 100%   BMI 37.77 kg/m  Pain Scale: 0-10 POSS *See Group Information*: S-Acceptable,Sleep, easy to arouse Pain Score: 9    SpO2: SpO2: 100 % O2 Device:SpO2: 100 % O2 Flow Rate: .O2 Flow Rate (L/min): 10 L/min  IO: Intake/output summary:   Intake/Output Summary (Last 24 hours) at 06/08/2019 1038 Last data filed at 06/08/2019 1005 Gross per 24 hour  Intake 1878.18 ml  Output 970 ml  Net 908.18 ml    LBM: Last BM Date: 06/07/19 Baseline Weight: Weight: 99.8 kg Most recent weight:  Weight: 99.8 kg     Palliative Assessment/Data: 20%     Time In: 1:00 Time Out: 2:10 Time Total: 70 min. Visit consisted of counseling and education dealing with the complex and emotionally intense issues surrounding the need for palliative care and symptom management in the setting of serious and potentially life-threatening illness. Greater than 50%  of this time was spent counseling and coordinating care related to the above assessment and plan.  Signed by: Florentina Jenny, PA-C Palliative Medicine Pager: [phone]  Please contact Palliative Medicine Team phone at [phone] for questions and concerns.  For individual provider: See Shea Evans

## 2019-06-08 NOTE — Progress Notes (Signed)
Heparin gtt was delayed giving due to difficult IV access. IV team consulted, unable to get midline per order. IV team suggested for PICC line considering for long term if neccessary.  Kennyth Lose, RN

## 2019-06-08 NOTE — Telephone Encounter (Signed)
Pt son calling and asking for a call back regarding pts care while in the hospital. Son states that he has been asked to sigh a TPN on pts behalf due to a blockage in pt throat and is wanting Dr. Virgil Benedict option before doing this.

## 2019-06-08 NOTE — Progress Notes (Signed)
Pharmacy Consult for TPN  Indication: dysphagia, unable to place cortak and GI unable to do EGD for esophageal dilatation.   Given timing of TPN consult, pharmacy will evaluate and order TPN as appropriate in AM. Will order TPN labs with AM labs.  Antonietta Jewel, PharmD, [place] Clinical Pharmacist  Pager: [phone] Phone: [phone]

## 2019-06-08 NOTE — Progress Notes (Signed)
Assessed patient for midline. Unable to find suitable vein for midline placement, however was able place a 1.88" PIV to RFA. Fran Lowes, RN VAST

## 2019-06-08 NOTE — Progress Notes (Signed)
Rt arm assess for PIV or midline.  Left arm restricted.   Veins in rt. Upper arm unsuitable for midline.  Cephalic vein non compressable; brachial veins too small and basilic vein too deep for midline.  Attempted PIV Rt forearm with ultrasound guidance unsuccessful.  No other suitable veins noted.  May consider other type of access such as PICC or CVC if more access desired.

## 2019-06-08 NOTE — Telephone Encounter (Signed)
Please advise 

## 2019-06-08 NOTE — Progress Notes (Addendum)
ANTICOAGULATION CONSULT NOTE - [facility] for IV heparin Indication: atrial fibrillation + VTE  Allergies  Allergen Reactions  . Niacin And Related Rash  . Keflex [Cephalexin] Other (See Comments)    Fatigue, sore throat, body aches  . Sausage [Pickled Meat] Other (See Comments)    Drowsiness and dizziness  . Tape Rash    Bandaids     Patient Measurements: Height: 5\' 4"  (162.6 cm) Weight: 220 lb 0.3 oz (99.8 kg) IBW/kg (Calculated) : 54.7 Heparin Dosing Weight: ~ 80 kg  Vital Signs: Temp: 97.6 F (36.4 C) (06/17 1213) Temp Source: Oral (06/17 1213) BP: 106/81 (06/17 1213) Pulse Rate: 82 (06/17 1338)  Labs: Recent Labs    06/06/19 0620  06/06/19 2337 06/07/19 1101 06/08/19 0457  HGB 8.2*  --  6.7* 9.7*  --   HCT 26.6*  --  21.5* 31.1*  --   PLT 223  --  175 199  --   LABPROT 18.4*  --  19.1*  --  15.7*  INR 1.6*  --  1.6*  --  1.3*  HEPARINUNFRC  --   --  0.22*  --   --   CREATININE 1.50*   < > 1.47* 1.24* 1.49*   < > = values in this interval not displayed.    Estimated Creatinine Clearance: 36.3 mL/min (A) (by C-G formula based on SCr of 1.49 mg/dL (H)).   Medical History: Past Medical History:  Diagnosis Date  . Acute respiratory failure with hypoxia ([place]) 05/2019  . Allergic rhinitis   . Anticoagulated on Coumadin   . Arthritis   . Diabetes mellitus   . GERD (gastroesophageal reflux disease)   . Hyperlipidemia   . Hypertension   . Pain in joint, ankle and foot 09/14/2013    Medications:  Scheduled:  . feeding supplement (ENSURE ENLIVE)  237 mL Oral TID BM  . feeding supplement (PRO-STAT SUGAR FREE 64)  30 mL Per Tube BID BM  . insulin aspart  0-20 Units Subcutaneous Q4H  . insulin glargine  10 Units Subcutaneous QHS  . ipratropium-albuterol  3 mL Nebulization TID  . [START ON 06/09/2019] methylPREDNISolone (SOLU-MEDROL) injection  40 mg Intravenous Daily  . metoprolol tartrate  25 mg Oral Q6H  . pantoprazole  40 mg Oral  BID  . sodium chloride flush  10-40 mL Intracatheter Q12H    Assessment: 78 yo Melanie [name] on chronic Coumadin for afib, currently on hold for suspicion of GIB.  No Coumadin given since prior to admission. Heparin was held previously for the endoscopy.   Found to have acute DVT in L upper extremity. INR 1.3. Hgb 9.7, plt 199 yesterday after receiving 1 PRBC. No s/sx of bleeding. Will hold on bolus given recent possible GI bleeding + previously therapeutic without bolus and reduce heparin goal.  Goal of Therapy:  Heparin level 0.3-0.5 units/ml Monitor platelets by anticoagulation protocol: Yes   Plan:  -Start infusion to 1250 units/hr  -Monitor heparin level, CBC, and s/sx of bleeding   Melanie Melanie [name], PharmD, [place] Clinical Pharmacist  Pager: [phone] Phone: [phone]  06/08/2019 4:39 PM

## 2019-06-08 NOTE — Progress Notes (Signed)
PROGRESS NOTE    Melanie [name]  UXN:[id number] DOB: [birth year] DOA: 05/22/2019 PCP: [name], MD   Brief Narrative:  78 year old WF PMHx dementia, diabetes type 2 uncontrolled with complication, DM neuropathy, HLD, essential HTN, A. fib on warfarin, dysphagia, cholelithiasis, colon polyps,  Admitted on5/31/2020, presented with complaint of epigastric abdominal pain nausea and vomiting, was found to have acute pancreatitis as well as sepsis of unknown origin. Currently further plan is continue current treatment.  Assessment & Plan:   Principal Problem:   Acute pancreatitis Active Problems:   HTN (hypertension)   DM (diabetes mellitus) type II controlled, neurological manifestation (HCC)   Afib (HCC)   SOB (shortness of breath)   Abdominal discomfort   Acute respiratory failure with hypoxia (HCC)   Cholelithiasis   Pleural effusion on left   Acute on chronic diastolic heart failure (HCC)   Dysphasia   Acute biliary pancreatitis Unable to perform or consider lap chole at this time. Lipase wnl.  Per GI note 6/9 patient will require surgical referral/consultation to consider a cholecystectomy prior to discharge as result of previously felt pancreatitis due to likely biliary/gallstone disease that occurred upon her initial admission.  Once patient more stable. MRCP negative for CBD dilation or CBD stone   Dysphagia,  Unclear etiology.  Possibly esophageal stricture.  IR unable to place [place].  GI unable to do EGD/ sedation, as they feel she would be intubated for sedation and would not be able to wean off or extubated.  GI has signed off.   Sepsis from CAP / aspiration pneumonia Completed the course of antibiotics.  Taper the solumedrol in the next few days.  Duo nebs as needed.  Bipap as needed.     Chronic atrial fib with RVR:  Rate controlled with BB. Heparin was held for the procedure.    Bilateral pleural effusions,  Underwent right 6/8 and  left thoracentesis on 6/9, 6/14, repeat CXR shows residual effusion. Pleural fluid cultures negative so far.     Left upper extremity swelling: Venous duplex revealed acute DVT and SVT on the left upper extremity.  Heparin to be restarted  Watch hemoglobin.    DVT prophylaxis:  Heparin gtt Code Status:  Full code Family Communication: none at bedside Disposition Plan: pending    Consultants:   Gastroenterology  Cardiology   Procedures/Significant Events:  6/4 EEG: Abnormal EEG due to the presence ofgeneral background slowing andtriphasic waves. This isseen most commonly in encephalopathic states, most commonly, but not limited to, metabolic encephalopathies 6/5 echocardiogram:-The left ventricle has hyperdynamic systolic function, with an ejection fraction of >65%.-The cavity size was decreased.  -Left Atrium: moderate to severely enlarged . -Right Atrium: mildly dilated. pressure is estimated at 10 mmHg. 6/8 RIGHT thoracentesis.: 1.2 L aspirated 6/9 LEFT thoracentesis; 672ml 6/11 PCXR:-Improved airspace disease in the RIGHT lung. - Persistent LEFT effusion and basilar atelectasis. -Hiatal hernia 6/12 echocardiogram with bubble study: Abnormal bubble contrast study.  Interstitial shunt noted suggestive of patent foramen ovale -LVEF 55 - 60% 6/13 PCXR: Lucency left lower mediastinum corresponds to known hiatal hernia -Moderately large LEFT pleural effusion has increased in size with worsening atelectasis or consolidation in the left lung -Mild patchy airspace opacity persists right lung  6/14 LEFT thoracentesis: 320 cc 6/16 transfuse 1 unit PRBC   Antimicrobials:  None   Subjective:   Objective: Vitals:   06/08/19 0453 06/08/19 0843 06/08/19 0846 06/08/19 1213  BP:  137/86  106/81  Pulse: 90  88  Resp:  17  13  Temp:  98.2 F (36.8 C)  97.6 F (36.4 C)  TempSrc:  Axillary  Oral  SpO2:  100%  100%  Weight:      Height:        Intake/Output Summary  (Last 24 hours) at 06/08/2019 1250 Last data filed at 06/08/2019 1005 Gross per 24 hour  Intake 1878.18 ml  Output 745 ml  Net 1133.18 ml   Filed Weights   06/05/19 2340 06/07/19 0330 06/08/19 0334  Weight: 100.1 kg 96.3 kg 99.8 kg    Examination:  General exam: Appears calm and comfortable  Respiratory system: diminished at bases Cardiovascular system: S1 & S2 heard,  Irregular.  Gastrointestinal system: Abdomen is nondistended, soft and nontender. No organomegaly or masses felt. Normal bowel sounds heard. Central nervous system: Alert and oriented.  Extremities: Symmetric 5 x 5 power. Skin: No rashes, lesions or ulcers Psychiatry: Mood & affect appropriate.     Data Reviewed: I have personally reviewed following labs and imaging studies  CBC: Recent Labs  Lab 06/04/19 0313 06/05/19 0517 06/06/19 0620 06/06/19 2337 06/07/19 1101  WBC 16.7* 14.6* 12.0* 12.4* 17.4*  HGB 8.6* 8.0* 8.2* 6.7* 9.7*  HCT 27.3* 25.7* 26.6* 21.5* 31.1*  MCV 95.8 96.6 97.8 96.8 97.5  PLT 168 193 223 175 099   Basic Metabolic Panel: Recent Labs  Lab 06/06/19 0620 06/06/19 0904 06/06/19 2337 06/07/19 1101 06/08/19 0457  NA 153* 153* 151* 151* 147*  K 3.5 4.1 3.7 3.9 4.1  CL 120* 121* 120* 121* 116*  CO2 22 20* 22 24 22   GLUCOSE 222* 188* 260* 150* 149*  BUN 51* 52* 55* 52* 54*  CREATININE 1.50* 1.59* 1.47* 1.24* 1.49*  CALCIUM 8.3* 8.3* 8.4* 8.3* 8.5*  MG 2.5* 2.6* 2.5* 2.6* 2.6*   GFR: Estimated Creatinine Clearance: 36.3 mL/min (A) (by C-G formula based on SCr of 1.49 mg/dL (H)). Liver Function Tests: Recent Labs  Lab 06/03/19 0301 06/04/19 0746 06/05/19 0517 06/06/19 0904  AST 21 24  --  34  ALT 32 25  --  54*  ALKPHOS 93 75  --  76  BILITOT 0.8 1.5*  --  0.8  PROT 4.9* 5.3*  --  5.1*  ALBUMIN 2.2* 2.8* 3.3* 2.9*   Recent Labs  Lab 06/03/19 0301 06/06/19 0904  LIPASE 53* 30  AMYLASE 79  --    Recent Labs  Lab 06/04/19 0313  AMMONIA 33   Coagulation Profile:  Recent Labs  Lab 06/04/19 0313 06/05/19 0957 06/06/19 0620 06/06/19 2337 06/08/19 0457  INR 1.7* 1.6* 1.6* 1.6* 1.3*   Cardiac Enzymes: No results for input(s): CKTOTAL, CKMB, CKMBINDEX, TROPONINI in the last 168 hours. BNP (last 3 results) No results for input(s): PROBNP in the last 8760 hours. HbA1C: No results for input(s): HGBA1C in the last 72 hours. CBG: Recent Labs  Lab 06/07/19 2021 06/08/19 0003 06/08/19 0417 06/08/19 0812 06/08/19 1201  GLUCAP 317* 253* 166* 101* 114*   Lipid Profile: No results for input(s): CHOL, HDL, LDLCALC, TRIG, CHOLHDL, LDLDIRECT in the last 72 hours. Thyroid Function Tests: No results for input(s): TSH, T4TOTAL, FREET4, T3FREE, THYROIDAB in the last 72 hours. Anemia Panel: No results for input(s): VITAMINB12, FOLATE, FERRITIN, TIBC, IRON, RETICCTPCT in the last 72 hours. Sepsis Labs: Recent Labs  Lab 06/02/19 1845  LATICACIDVEN 1.7    Recent Results (from the past 240 hour(s))  Gram stain     Status: None   Collection Time: 05/30/19  9:23 AM   Specimen: Pleural, Right; Pleural Fluid  Result Value Ref Range Status   Specimen Description PLEURAL RIGHT  Final   Special Requests NONE  Final   Gram Stain   Final    FEW WBC PRESENT,BOTH PMN AND MONONUCLEAR NO ORGANISMS SEEN Performed at [facility], [place] [address], [place], [place] [address]    Report Status 05/30/2019 FINAL  Final  Culture, body fluid-bottle     Status: None   Collection Time: 05/30/19  9:23 AM   Specimen: Pleura  Result Value Ref Range Status   Specimen Description PLEURAL RIGHT  Final   Special Requests NONE  Final   Culture   Final    NO GROWTH 5 DAYS Performed at [place] [address] [place], [place] [address]    Report Status 06/04/2019 FINAL  Final  Culture, body fluid-bottle     Status: None   Collection Time: 05/31/19  2:02 PM   Specimen: Pleura  Result Value Ref Range Status   Specimen Description PLEURAL  Final   Special  Requests FLUID  Final   Culture   Final    NO GROWTH 5 DAYS Performed at [facility], [address], [place], [place] [address]    Report Status 06/05/2019 FINAL  Final  Gram stain     Status: None   Collection Time: 05/31/19  2:02 PM   Specimen: Pleura  Result Value Ref Range Status   Specimen Description PLEURAL  Final   Special Requests FLUID  Final   Gram Stain   Final    WBC PRESENT,BOTH PMN AND MONONUCLEAR NO ORGANISMS SEEN CYTOSPIN SMEAR Performed at [facility], [address], [place], [place] [address]    Report Status 05/31/2019 FINAL  Final  Respiratory Panel by PCR     Status: None   Collection Time: 06/03/19 10:07 AM   Specimen: Nasopharyngeal Swab; Respiratory  Result Value Ref Range Status   Adenovirus NOT DETECTED NOT DETECTED Final   Coronavirus 229E NOT DETECTED NOT DETECTED Final    Comment: (NOTE) The Coronavirus on the Respiratory Panel, DOES NOT test for the novel  Coronavirus (2019 nCoV)    Coronavirus HKU1 NOT DETECTED NOT DETECTED Final   Coronavirus NL63 NOT DETECTED NOT DETECTED Final   Coronavirus OC43 NOT DETECTED NOT DETECTED Final   Metapneumovirus NOT DETECTED NOT DETECTED Final   Rhinovirus / Enterovirus NOT DETECTED NOT DETECTED Final   Influenza A NOT DETECTED NOT DETECTED Final   Influenza B NOT DETECTED NOT DETECTED Final   Parainfluenza Virus 1 NOT DETECTED NOT DETECTED Final   Parainfluenza Virus 2 NOT DETECTED NOT DETECTED Final   Parainfluenza Virus 3 NOT DETECTED NOT DETECTED Final   Parainfluenza Virus 4 NOT DETECTED NOT DETECTED Final   Respiratory Syncytial Virus NOT DETECTED NOT DETECTED Final   Bordetella pertussis NOT DETECTED NOT DETECTED Final   Chlamydophila pneumoniae NOT DETECTED NOT DETECTED Final   Mycoplasma pneumoniae NOT DETECTED NOT DETECTED Final    Comment: Performed at [facility], [employer]. [address], [place], [place] [address]  SARS Coronavirus 2     Status: None   Collection Time: 06/05/19   9:05 AM  Result Value Ref Range Status   SARS Coronavirus 2 NOT DETECTED NOT DETECTED Final    Comment: (NOTE) SARS-CoV-2 target nucleic acids are NOT DETECTED. The SARS-CoV-2 RNA is generally detectable in upper and lower respiratory specimens during the acute phase of  infection.  Negative  results do not preclude SARS-CoV-2 infection, do not rule out co-infections with other pathogens, and should not be used as the sole basis for treatment or other patient management decisions.  Negative results must be combined with clinical observations, patient history, and epidemiological information. The expected result is Not Detected. Fact Sheet for Patients: http://www.biofiredefense.com/wp-content/uploads/2020/03/BIOFIRE-COVID -19-patients.pdf Fact Sheet for Healthcare Providers: http://www.biofiredefense.com/wp-content/uploads/2020/03/BIOFIRE-COVID -19-hcp.pdf This test is not yet approved or cleared by the [place] and  has been authorized for detection and/or diagnosis of SARS-CoV-2 by FDA under an Emergency Use Authorization (EUA).  This EUA will remain in effec t (meaning this test can be used) for the duration of  the COVID-19 declaration under Section 564(b)(1) of the Act, 21 U.S.C. section 360bbb-3(b)(1), unless the authorization is terminated or revoked sooner. Performed at [facility], [place] [address], [place], [place] [address]   Gram stain     Status: None   Collection Time: 06/05/19 12:49 PM   Specimen: Pleural, Left; Pleural Fluid  Result Value Ref Range Status   Specimen Description PLEURAL LEFT  Final   Special Requests NONE  Final   Gram Stain   Final    FEW WBC PRESENT, PREDOMINANTLY MONONUCLEAR NO ORGANISMS SEEN Performed at [facility], [place] [address] [place], [place] [address]    Report Status 06/06/2019 FINAL  Final  Fungus Culture With Stain     Status: None (Preliminary result)   Collection Time: 06/05/19 12:49 PM   Specimen:  Pleural, Left; Pleural Fluid  Result Value Ref Range Status   Fungus Stain Final report  Final    Comment: (NOTE) Performed At: [facility] [place], [place] [id number] Rush Farmer MD [id number]    Fungus (Mycology) Culture PENDING  Incomplete   Fungal Source PLEURAL  Final    Comment: LEFT Performed at [facility], [place] [address] [place], [place] [address]   Fungus Culture Result     Status: None   Collection Time: 06/05/19 12:49 PM  Result Value Ref Range Status   Result 1 Comment  Final    Comment: (NOTE) KOH/Calcofluor preparation:  no fungus observed. Performed At: [facility] [place], [place] [id number] Rush Farmer MD KW:[id number]          Radiology Studies: No results found.      Scheduled Meds: . feeding supplement (ENSURE ENLIVE)  237 mL Oral TID BM  . feeding supplement (PRO-STAT SUGAR FREE 64)  30 mL Per Tube BID BM  . insulin aspart  0-20 Units Subcutaneous Q4H  . insulin glargine  10 Units Subcutaneous QHS  . ipratropium-albuterol  3 mL Nebulization TID  . methylPREDNISolone (SOLU-MEDROL) injection  60 mg Intravenous Daily  . metoprolol tartrate  25 mg Oral Q6H  . pantoprazole  40 mg Oral BID  . sodium chloride flush  10-40 mL Intracatheter Q12H   Continuous Infusions: . sodium chloride 20 mL/hr at 06/07/19 1400  . acetaminophen    . dextrose 40 mL/hr at 06/08/19 1221     LOS: 16 days    Time spent: 39 minutes    Hosie Poisson, MD [facility] Pager [phone]  If 7PM-7AM, please contact night-coverage [email or web address] Password [facility] 06/08/2019, 12:50 PM

## 2019-06-08 NOTE — Telephone Encounter (Signed)
The notes that I see indicate she was not able to have anesthesia yesterday for a feeding tube b/c anesthesia feared she would not be able to come off of the vent.  If she is unable to eat and they are unable to place a feeding tube, the next best thing is to start TPN so that she is able to have some nutrition.

## 2019-06-08 NOTE — Progress Notes (Signed)
LUE venous duplex       has been completed. Preliminary results can be found under CV proc through chart review. June Leap, BS, RDMS, RVT    Called positive results to [place], [place]

## 2019-06-08 NOTE — Telephone Encounter (Signed)
Patient son notified of PCP recommendations and is agreement and expresses an understanding.   [place] for [facility] to Discuss results / PCP recommendations / Schedule patient.

## 2019-06-09 ENCOUNTER — Ambulatory Visit: Payer: Medicare HMO | Admitting: Podiatry

## 2019-06-09 DIAGNOSIS — Z515 Encounter for palliative care: Secondary | ICD-10-CM

## 2019-06-09 DIAGNOSIS — Z7189 Other specified counseling: Secondary | ICD-10-CM

## 2019-06-09 LAB — CBC WITH DIFFERENTIAL/PLATELET
Abs Immature Granulocytes: 0 10*3/uL (ref 0.00–0.07)
Basophils Absolute: 0 10*3/uL (ref 0.0–0.1)
Basophils Relative: 0 %
Eosinophils Absolute: 0 10*3/uL (ref 0.0–0.5)
Eosinophils Relative: 0 %
HCT: 39.4 % (ref 36.0–46.0)
Hemoglobin: 12.2 g/dL (ref 12.0–15.0)
Lymphocytes Relative: 0 %
Lymphs Abs: 0 10*3/uL — ABNORMAL LOW (ref 0.7–4.0)
MCH: 29.9 pg (ref 26.0–34.0)
MCHC: 31 g/dL (ref 30.0–36.0)
MCV: 96.6 fL (ref 80.0–100.0)
Monocytes Absolute: 1.1 10*3/uL — ABNORMAL HIGH (ref 0.1–1.0)
Monocytes Relative: 4 %
Neutro Abs: 25.5 10*3/uL — ABNORMAL HIGH (ref 1.7–7.7)
Neutrophils Relative %: 96 %
Platelets: 198 10*3/uL (ref 150–400)
RBC: 4.08 MIL/uL (ref 3.87–5.11)
RDW: 15.9 % — ABNORMAL HIGH (ref 11.5–15.5)
WBC: 26.6 10*3/uL — ABNORMAL HIGH (ref 4.0–10.5)
nRBC: 0 % (ref 0.0–0.2)
nRBC: 0 /100 WBC

## 2019-06-09 LAB — GLUCOSE, CAPILLARY
Glucose-Capillary: 144 mg/dL — ABNORMAL HIGH (ref 70–99)
Glucose-Capillary: 149 mg/dL — ABNORMAL HIGH (ref 70–99)
Glucose-Capillary: 174 mg/dL — ABNORMAL HIGH (ref 70–99)
Glucose-Capillary: 175 mg/dL — ABNORMAL HIGH (ref 70–99)
Glucose-Capillary: 192 mg/dL — ABNORMAL HIGH (ref 70–99)
Glucose-Capillary: 205 mg/dL — ABNORMAL HIGH (ref 70–99)

## 2019-06-09 LAB — COMPREHENSIVE METABOLIC PANEL
ALT: 19 U/L (ref 0–44)
AST: 23 U/L (ref 15–41)
Albumin: 2.5 g/dL — ABNORMAL LOW (ref 3.5–5.0)
Alkaline Phosphatase: 46 U/L (ref 38–126)
Anion gap: 11 (ref 5–15)
BUN: 22 mg/dL (ref 8–23)
CO2: 23 mmol/L (ref 22–32)
Calcium: 7.8 mg/dL — ABNORMAL LOW (ref 8.9–10.3)
Chloride: 103 mmol/L (ref 98–111)
Creatinine, Ser: 0.81 mg/dL (ref 0.44–1.00)
GFR calc Af Amer: >60 mL/min (ref >60)
GFR calc non Af Amer: >60 mL/min (ref >60)
Glucose, Bld: 120 mg/dL — ABNORMAL HIGH (ref 70–99)
Potassium: 3 mmol/L — ABNORMAL LOW (ref 3.5–5.1)
Sodium: 137 mmol/L (ref 135–145)
Total Bilirubin: 0.6 mg/dL (ref 0.3–1.2)
Total Protein: 4.8 g/dL — ABNORMAL LOW (ref 6.5–8.1)

## 2019-06-09 LAB — PROTIME-INR
INR: 1.1 (ref 0.8–1.2)
Prothrombin Time: 13.6 seconds (ref 11.4–15.2)

## 2019-06-09 LAB — PHOSPHORUS: Phosphorus: 1.3 mg/dL — ABNORMAL LOW (ref 2.5–4.6)

## 2019-06-09 LAB — HEPARIN LEVEL (UNFRACTIONATED)
Heparin Unfractionated: 0.64 IU/mL (ref 0.30–0.70)
Heparin Unfractionated: 0.36 IU/mL (ref 0.30–0.70)

## 2019-06-09 LAB — PREALBUMIN: Prealbumin: 17.2 mg/dL — ABNORMAL LOW (ref 18–38)

## 2019-06-09 LAB — MAGNESIUM: Magnesium: 1.9 mg/dL (ref 1.7–2.4)

## 2019-06-09 LAB — TRIGLYCERIDES: Triglycerides: 210 mg/dL — ABNORMAL HIGH (ref <150)

## 2019-06-09 MED ORDER — POTASSIUM & SODIUM PHOSPHATES 280-160-250 MG PO PACK
1.0000 | PACK | Freq: Three times a day (TID) | ORAL | Status: DC
  Administered 2019-06-09 – 2019-06-12 (×10): 1 via ORAL
  Filled 2019-06-09 (×12): qty 1

## 2019-06-09 MED ORDER — DEXTROSE 10 % IV SOLN
INTRAVENOUS | Status: DC
  Administered 2019-06-09 – 2019-06-11 (×3): via INTRAVENOUS

## 2019-06-09 MED ORDER — POTASSIUM CHLORIDE CRYS ER 20 MEQ PO TBCR: 40.0000 meq | EXTENDED_RELEASE_TABLET | Freq: Two times a day (BID) | ORAL | Status: DC

## 2019-06-09 NOTE — Progress Notes (Signed)
Main Lab reported getting inaccurate result for CBC and Heparin level.  Phlebotomist reported multiple attempts for blood drown from Pt's right hand which was the same side as IV and heparin infusing. Pt had restriction of her left arm because of her DVT.  We will replete blood work again at 8 am, the same time per pharmacist requested for Heparin level.  Kennyth Lose, RN

## 2019-06-09 NOTE — Progress Notes (Signed)
PROGRESS NOTE    Melanie [name]  XBL:[id number] DOB: [birth year] DOA: 05/22/2019 PCP: [name], MD   Brief Narrative:  78 year old WF PMHx dementia, diabetes type 2 uncontrolled with complication, DM neuropathy, HLD, essential HTN, A. fib on warfarin, dysphagia, cholelithiasis, colon polyps,  Admitted on5/31/2020, presented with complaint of epigastric abdominal pain nausea and vomiting, was found to have acute pancreatitis as well as sepsis of unknown origin. Currently further plan is continue current treatment.  Assessment & Plan:   Principal Problem:   Acute pancreatitis Active Problems:   HTN (hypertension)   DM (diabetes mellitus) type II controlled, neurological manifestation (HCC)   Afib (HCC)   SOB (shortness of breath)   Abdominal discomfort   Acute respiratory failure with hypoxia (HCC)   Cholelithiasis   Pleural effusion on left   Acute on chronic diastolic heart failure (HCC)   Dysphasia   Bilateral pleural effusion   Palliative care encounter   Encounter for hospice care discussion   Acute biliary pancreatitis Unable to perform or consider lap chole at this time. Lipase wnl.  Per GI note 6/9 patient will require surgical referral/consultation to consider a cholecystectomy prior to discharge as result of previously felt pancreatitis due to likely biliary/gallstone disease that occurred upon her initial admission.  Once patient more stable. MRCP negative for CBD dilation or CBD stone   Dysphagia,  Unclear etiology.  Possibly esophageal stricture.  IR unable to place [place].  GI unable to do EGD/ sedation, as they feel she would be intubated for sedation and would not be able to wean off or extubated.  GI has signed off.   Sepsis from CAP / aspiration pneumonia Completed the course of antibiotics and steroids. Duo nebs as needed.   Hypokalemia  Replaced.  Repeat in am.    Hypophosphatemia  Replaced.   Chronic atrial fib with RVR:   Rate controlled with BB.    Bilateral pleural effusions,  Underwent right 6/8 and left thoracentesis on 6/9, 6/14, repeat CXR shows residual effusion. Pleural fluid cultures negative so far.     Left upper extremity swelling: Venous duplex revealed acute DVT and SVT on the left upper extremity.  Heparin was started but had to be discontinued as patient and family agree to be transitioned to hospice and comfort measures.   In view of the multiple medical problems, clinical deterioration, poor nutrition and poor functional status, palliative care consulted and plan to transition to hospice in the next 24 hours.      DVT prophylaxis:  scd's Code Status: DNR Family Communication: none at bedside Disposition Plan: pending , POSSIBLE HOSPICE house in the next 24 hours.    Consultants:   Gastroenterology  Cardiology  Palliative care.    Procedures/Significant Events:  6/4 EEG: Abnormal EEG due to the presence ofgeneral background slowing andtriphasic waves. This isseen most commonly in encephalopathic states, most commonly, but not limited to, metabolic encephalopathies 6/5 echocardiogram:-The left ventricle has hyperdynamic systolic function, with an ejection fraction of >65%.-The cavity size was decreased.  -Left Atrium: moderate to severely enlarged . -Right Atrium: mildly dilated. pressure is estimated at 10 mmHg. 6/8 RIGHT thoracentesis.: 1.2 L aspirated 6/9 LEFT thoracentesis; 638ml 6/11 PCXR:-Improved airspace disease in the RIGHT lung. - Persistent LEFT effusion and basilar atelectasis. -Hiatal hernia 6/12 echocardiogram with bubble study: Abnormal bubble contrast study.  Interstitial shunt noted suggestive of patent foramen ovale -LVEF 55 - 60% 6/13 PCXR: Lucency left lower mediastinum corresponds to known hiatal hernia -Moderately large  LEFT pleural effusion has increased in size with worsening atelectasis or consolidation in the left lung -Mild patchy airspace  opacity persists right lung  6/14 LEFT thoracentesis: 320 cc 6/16 transfuse 1 unit PRBC   Antimicrobials:  None   Subjective: Lethargic, minimal conversative.   Objective: Vitals:   06/09/19 0934 06/09/19 1200 06/09/19 1600 06/09/19 1733  BP:  115/66 110/63   Pulse: 92 87 89 94  Resp:  (!) 21 20   Temp:  (!) 97.5 F (36.4 C) 97.7 F (36.5 C)   TempSrc:  Oral Oral   SpO2:  100% 100%   Weight:      Height:        Intake/Output Summary (Last 24 hours) at 06/09/2019 1750 Last data filed at 06/09/2019 1310 Gross per 24 hour  Intake 1394.41 ml  Output 1250 ml  Net 144.41 ml   Filed Weights   06/07/19 0330 06/08/19 0334 06/09/19 0310  Weight: 96.3 kg 99.8 kg 103.9 kg    Examination:  General exam: Appears calm and comfortable , does not appear to be in distress.  Respiratory system: diminished at bases Cardiovascular system: S1 & S2 heard,  Irregular.  Gastrointestinal system: Abdomen is soft NT ND BS+ Central nervous system:  Lethargic, not able to perform full neuro evaluation.  Extremities: left lower extremity swelling and tenderness> right lower extremity swelling.  Skin: see above. Psychiatry: cannot be assessed.    Data Reviewed: I have personally reviewed following labs and imaging studies  CBC: Recent Labs  Lab 06/05/19 0517 06/06/19 0620 06/06/19 2337 06/07/19 1101 06/08/19 1642 06/09/19 1055  WBC 14.6* 12.0* 12.4* 17.4*  --  26.6*  NEUTROABS  --   --   --   --   --  25.5*  HGB 8.0* 8.2* 6.7* 9.7* 9.9* 12.2  HCT 25.7* 26.6* 21.5* 31.1* 32.1* 39.4  MCV 96.6 97.8 96.8 97.5  --  96.6  PLT 193 223 175 199  --  193   Basic Metabolic Panel: Recent Labs  Lab 06/06/19 0904 06/06/19 2337 06/07/19 1101 06/08/19 0457 06/09/19 0009  NA 153* 151* 151* 147* 137  K 4.1 3.7 3.9 4.1 3.0*  CL 121* 120* 121* 116* 103  CO2 20* 22 24 22 23   GLUCOSE 188* 260* 150* 149* 120*  BUN 52* 55* 52* 54* 22  CREATININE 1.59* 1.47* 1.24* 1.49* 0.81  CALCIUM 8.3*  8.4* 8.3* 8.5* 7.8*  MG 2.6* 2.5* 2.6* 2.6* 1.9  PHOS  --   --   --   --  1.3*   GFR: Estimated Creatinine Clearance: 68.3 mL/min (by C-G formula based on SCr of 0.81 mg/dL). Liver Function Tests: Recent Labs  Lab 06/03/19 0301 06/04/19 0746 06/05/19 0517 06/06/19 0904 06/09/19 0009  AST 21 24  --  34 23  ALT 32 25  --  54* 19  ALKPHOS 93 75  --  76 46  BILITOT 0.8 1.5*  --  0.8 0.6  PROT 4.9* 5.3*  --  5.1* 4.8*  ALBUMIN 2.2* 2.8* 3.3* 2.9* 2.5*   Recent Labs  Lab 06/03/19 0301 06/06/19 0904  LIPASE 53* 30  AMYLASE 79  --    Recent Labs  Lab 06/04/19 0313  AMMONIA 33   Coagulation Profile: Recent Labs  Lab 06/05/19 0957 06/06/19 0620 06/06/19 2337 06/08/19 0457 06/09/19 0009  INR 1.6* 1.6* 1.6* 1.3* 1.1   Cardiac Enzymes: No results for input(s): CKTOTAL, CKMB, CKMBINDEX, TROPONINI in the last 168 hours. BNP (last 3 results)  No results for input(s): PROBNP in the last 8760 hours. HbA1C: No results for input(s): HGBA1C in the last 72 hours. CBG: Recent Labs  Lab 06/09/19 0001 06/09/19 0404 06/09/19 0827 06/09/19 1224 06/09/19 1720  GLUCAP 205* 175* 149* 144* 174*   Lipid Profile: Recent Labs    06/09/19 0137  TRIG 210*   Thyroid Function Tests: No results for input(s): TSH, T4TOTAL, FREET4, T3FREE, THYROIDAB in the last 72 hours. Anemia Panel: No results for input(s): VITAMINB12, FOLATE, FERRITIN, TIBC, IRON, RETICCTPCT in the last 72 hours. Sepsis Labs: Recent Labs  Lab 06/02/19 1845  LATICACIDVEN 1.7    Recent Results (from the past 240 hour(s))  Culture, body fluid-bottle     Status: None   Collection Time: 05/31/19  2:02 PM   Specimen: Pleura  Result Value Ref Range Status   Specimen Description PLEURAL  Final   Special Requests FLUID  Final   Culture   Final    NO GROWTH 5 DAYS Performed at [facility], [address] [place], [place] [address]    Report Status 06/05/2019 FINAL  Final  Gram stain     Status: None    Collection Time: 05/31/19  2:02 PM   Specimen: Pleura  Result Value Ref Range Status   Specimen Description PLEURAL  Final   Special Requests FLUID  Final   Gram Stain   Final    WBC PRESENT,BOTH PMN AND MONONUCLEAR NO ORGANISMS SEEN CYTOSPIN SMEAR Performed at [facility], [address], [place], [place] [address]    Report Status 05/31/2019 FINAL  Final  Respiratory Panel by PCR     Status: None   Collection Time: 06/03/19 10:07 AM   Specimen: Nasopharyngeal Swab; Respiratory  Result Value Ref Range Status   Adenovirus NOT DETECTED NOT DETECTED Final   Coronavirus 229E NOT DETECTED NOT DETECTED Final    Comment: (NOTE) The Coronavirus on the Respiratory Panel, DOES NOT test for the novel  Coronavirus (2019 nCoV)    Coronavirus HKU1 NOT DETECTED NOT DETECTED Final   Coronavirus NL63 NOT DETECTED NOT DETECTED Final   Coronavirus OC43 NOT DETECTED NOT DETECTED Final   Metapneumovirus NOT DETECTED NOT DETECTED Final   Rhinovirus / Enterovirus NOT DETECTED NOT DETECTED Final   Influenza A NOT DETECTED NOT DETECTED Final   Influenza B NOT DETECTED NOT DETECTED Final   Parainfluenza Virus 1 NOT DETECTED NOT DETECTED Final   Parainfluenza Virus 2 NOT DETECTED NOT DETECTED Final   Parainfluenza Virus 3 NOT DETECTED NOT DETECTED Final   Parainfluenza Virus 4 NOT DETECTED NOT DETECTED Final   Respiratory Syncytial Virus NOT DETECTED NOT DETECTED Final   Bordetella pertussis NOT DETECTED NOT DETECTED Final   Chlamydophila pneumoniae NOT DETECTED NOT DETECTED Final   Mycoplasma pneumoniae NOT DETECTED NOT DETECTED Final    Comment: Performed at [facility], [place], [place], [place] [address]  SARS Coronavirus 2     Status: None   Collection Time: 06/05/19  9:05 AM  Result Value Ref Range Status   SARS Coronavirus 2 NOT DETECTED NOT DETECTED Final    Comment: (NOTE) SARS-CoV-2 target nucleic acids are NOT DETECTED. The SARS-CoV-2 RNA is generally detectable in  upper and lower respiratory specimens during the acute phase of infection.  Negative  results do not preclude SARS-CoV-2 infection, do not rule out co-infections with other pathogens, and should not be used as the sole basis for treatment or other patient management decisions.  Negative results must be combined with clinical observations, patient history, and epidemiological information. The expected result is Not Detected. Fact Sheet for Patients: http://www.biofiredefense.com/wp-content/uploads/2020/03/BIOFIRE-COVID -19-patients.pdf Fact Sheet for Healthcare Providers: http://www.biofiredefense.com/wp-content/uploads/2020/03/BIOFIRE-COVID -19-hcp.pdf This test is not yet approved or cleared by the [place] and  has been authorized for detection and/or diagnosis of SARS-CoV-2 by FDA under an Emergency Use Authorization (EUA).  This EUA will remain in effec t (meaning this test can be used) for the duration of  the COVID-19 declaration under Section 564(b)(1) of the Act, 21 U.S.C. section 360bbb-3(b)(1), unless the authorization is terminated or revoked sooner. Performed at [facility], [place] [address] [place], [place] [address]   Gram stain     Status: None   Collection Time: 06/05/19 12:49 PM   Specimen: Pleural, Left; Pleural Fluid  Result Value Ref Range Status   Specimen Description PLEURAL LEFT  Final   Special Requests NONE  Final   Gram Stain   Final    FEW WBC PRESENT, PREDOMINANTLY MONONUCLEAR NO ORGANISMS SEEN Performed at [facility], [place] [address], [place], [place] [address]    Report Status 06/06/2019 FINAL  Final  Fungus Culture With Stain     Status: None (Preliminary result)   Collection Time: 06/05/19 12:49 PM   Specimen: Pleural, Left; Pleural Fluid  Result Value Ref Range Status   Fungus Stain Final report  Final    Comment: (NOTE) Performed At: [facility] [place], [place] [id number] Rush Farmer MD  FX:[id number]    Fungus (Mycology) Culture PENDING  Incomplete   Fungal Source PLEURAL  Final    Comment: LEFT Performed at [facility], [place] [address] [place], [place] [address]   Fungus Culture Result     Status: None   Collection Time: 06/05/19 12:49 PM  Result Value Ref Range Status   Result 1 Comment  Final    Comment: (NOTE) KOH/Calcofluor preparation:  no fungus observed. Performed At: [facility] [place], [place] [id number] Rush Farmer MD SU:[id number]          Radiology Studies: Dg Chest Port 1 View  Result Date: 06/08/2019 CLINICAL DATA:  Sepsis EXAM: PORTABLE CHEST 1 VIEW COMPARISON:  06/05/2019, 06/04/2019, 06/02/2019 FINDINGS: Small moderate left greater than right pleural effusions. Worsening airspace disease at the right base. Persistent airspace disease at the left lung base. Stable enlarged cardiomediastinal silhouette. Asymmetric opacity in the right hilus. No pneumothorax. IMPRESSION: 1. Small moderate bilateral left greater than right pleural effusions, increased on the right side 2. Worsening airspace disease at the right lung base with continued consolidation at the left base, atelectasis versus pneumonia 3. Cardiomegaly. Asymmetric right hilar opacity, could be due to rotation and prominent pulmonary artery, however recommend dedicated two-view chest when clinically feasible. Electronically Signed   By: Donavan Foil M.D.   On: 06/08/2019 19:29   Vas [place] Upper Extremity Venous Duplex  Result Date: 06/08/2019 UPPER VENOUS STUDY  Indications: Edema Comparison Study: no prior Performing Technologist: June Leap RDMS, RVT  Examination Guidelines: A complete evaluation includes B-mode imaging, spectral Doppler, color Doppler, and power Doppler as needed of all accessible portions of each vessel. Bilateral testing is considered an integral part of a complete examination. Limited examinations for reoccurring indications may be performed  as noted.  Right Findings: +----------+------------+---------+-----------+----------+-------+ RIGHT     CompressiblePhasicitySpontaneousPropertiesSummary +----------+------------+---------+-----------+----------+-------+ Subclavian               Yes  Yes                      +----------+------------+---------+-----------+----------+-------+  Left Findings: +----------+------------+---------+-----------+----------+--------------+ LEFT      CompressiblePhasicitySpontaneousProperties   Summary     +----------+------------+---------+-----------+----------+--------------+ IJV                                                 Not visualized +----------+------------+---------+-----------+----------+--------------+ Subclavian    Full       Yes       Yes                             +----------+------------+---------+-----------+----------+--------------+ Axillary      Full       Yes       Yes                             +----------+------------+---------+-----------+----------+--------------+ Brachial      None       No        No               PICC LOCATION  +----------+------------+---------+-----------+----------+--------------+ Radial        Full                                                 +----------+------------+---------+-----------+----------+--------------+ Ulnar         Full                                                 +----------+------------+---------+-----------+----------+--------------+ Cephalic      None                                                 +----------+------------+---------+-----------+----------+--------------+ Basilic                                             Not visualized +----------+------------+---------+-----------+----------+--------------+  Summary:  Right: No evidence of thrombosis in the subclavian.  Left: Findings consistent with acute deep vein thrombosis involving the left brachial veins. Findings  consistent with acute superficial vein thrombosis involving the left cephalic vein.  *See table(s) above for measurements and observations.  Diagnosing physician: Curt Jews MD Electronically signed by Curt Jews MD on 06/08/2019 at 5:10:03 PM.    Final         Scheduled Meds: . feeding supplement (ENSURE ENLIVE)  237 mL Oral TID BM  . feeding supplement (PRO-STAT SUGAR FREE 64)  30 mL Per Tube BID BM  . furosemide  20 mg Oral Daily  . insulin aspart  0-20 Units Subcutaneous Q4H  . insulin glargine  10 Units Subcutaneous QHS  . ipratropium-albuterol  3 mL Nebulization TID  . methylPREDNISolone (SOLU-MEDROL) injection  40 mg Intravenous Daily  .  metoprolol tartrate  25 mg Oral Q6H  . pantoprazole  40 mg Oral BID  . potassium & sodium phosphates  1 packet Oral TID WC & HS  . sodium chloride flush  10-40 mL Intracatheter Q12H   Continuous Infusions: . sodium chloride Stopped (06/07/19 2041)  . dextrose 50 mL/hr at 06/09/19 0934     LOS: 17 days    Time spent: 39 minutes    Hosie Poisson, MD [facility] Pager [phone]  If 7PM-7AM, please contact night-coverage [email or web address] Password [email or web address] 06/09/2019, 5:50 PM

## 2019-06-09 NOTE — Progress Notes (Signed)
Nutrition Follow-up  DOCUMENTATION CODES:   Obesity unspecified  INTERVENTION:    Continue Ensure Enlive po TID, each supplement provides 350 kcal and 20 grams of protein  Continue Magic cup TID with meals, each supplement provides 290 kcal and 9 grams of protein  NUTRITION DIAGNOSIS:   Inadequate oral intake related to altered GI function as evidenced by NPO status.  Diet advanced   GOAL:   Patient will meet greater than or equal to 90% of their needs  Not meeting  MONITOR:   Diet advancement, Labs, Weight trends, Skin, I & O's  REASON FOR ASSESSMENT:   Low [place]    ASSESSMENT:   Melanie [name] is a 78 y.o. female with medical history significant of DM2, HTN.  Patient presents to the ED with c/o epigastric abd pain, N/V.  Symptoms onset suddenly 3 hours ago, symptoms are severe, nothing makes better or worse, pain located in epigastrium.   6/1- s/p MRCP- no stone revealed 6/5- s/p BSE- recommend continued NPO status; s/p soap suds enema with BM 6/6- CXR revealed moderate bilateral pleural effusions 6/7- BSE cancelled secondary to increased O2 support 6/8- s/p rt thoracentesis (1.2 L of hazy gold fluid removed), s/p BSE- recommend continued NPO with ice chips after oral care 6/9- s/p lt thoracentesis (650 ml hazy gold fluid removed), s/p [place] bilateral chest revealed bilateral lung sliding, trivial pleural effusions, and significant atelectasis  6/10- s/p BSE- advanced to full liquids 6/12- cortrak placement attemptedbut unsuccessful due to pt desatting during procedure 6/13- IR attempted tube placement but also unsuccessful (tube unable to pass secondary to esophagus inflamed/swollen/strictures) 6/14- s/p lt thoracentesis (320 ml yellow fluid removed)  EDG canceled on 6/16 due to pt's worsening respiratory status. Anesthesia felt pt would possibly require intubation and they were concerned she may not be able to wean off.TPN was brought up as last option. PMT met  with family today and they do not wish to start this.   Meal completions charted as 0-10% for pt's last 7 meals. Plan for possible d/c to [facility] place with comfort feedings. Will continue supplements as tolerated.   Admission weight: 92.9 kg Current weight 103.9 kg   I/O: +5,711 ml since 6/4 UOP: 900 ml x 24 hrs   Drips: D10 @ 50 ml/hr  Medications: 20 mg lasix daily, SS novolog, Lantus, solu-medrol, Phos-nak Labs: K 3.0 (L) Phosphorus 1.3 (L) CBG 144-205  Diet Order:   Diet Order            DIET - DYS 1 Room service appropriate? Yes; Fluid consistency: Thin  Diet effective now              EDUCATION NEEDS:   Not appropriate for education at this time  Skin:  Skin Assessment: Skin Integrity Issues: Skin Integrity Issues:: Incisions Incisions: closed lt back  Last BM:  6/18- via rectal tube  Height:   Ht Readings from Last 1 Encounters:  05/23/19 '5\' 4"'$  (1.626 m)    Weight:   Wt Readings from Last 1 Encounters:  06/09/19 103.9 kg    Ideal Body Weight:  54.5 kg  BMI:  Body mass index is 39.31 kg/m.  Estimated Nutritional Needs:   Kcal:  1700-1900  Protein:  95-110 grams  Fluid:  1.7-1.9 L   Mariana Single RD, LDN Clinical Nutrition Pager # [phone]

## 2019-06-09 NOTE — Progress Notes (Signed)
Family meeting scheduled for today to address [place]. At this time patient does not have a PICC line, which would prohibit the patient from receiving TPN.  Pending GOC discussion, TPN will begin tomorrow.  Harvel Quale 06/09/2019 11:43 AM

## 2019-06-09 NOTE — Progress Notes (Signed)
ANTICOAGULATION CONSULT NOTE - [place] for IV heparin Indication: atrial fibrillation + VTE  Allergies  Allergen Reactions  . Niacin And Related Rash  . Keflex [Cephalexin] Other (See Comments)    Fatigue, sore throat, body aches  . Sausage [Pickled Meat] Other (See Comments)    Drowsiness and dizziness  . Tape Rash    Bandaids     Patient Measurements: Height: 5\' 4"  (162.6 cm) Weight: 229 lb (103.9 kg)( changed to  bariatric bed at am.) IBW/kg (Calculated) : 54.7 Heparin Dosing Weight: ~ 80 kg  Vital Signs: Temp: 97.5 F (36.4 C) (06/18 1200) Temp Source: Oral (06/18 1200) BP: 115/66 (06/18 1200) Pulse Rate: 87 (06/18 1200)  Labs: Recent Labs    06/06/19 2337 06/07/19 1101 06/08/19 0457 06/08/19 1642 06/09/19 0009 06/09/19 0100 06/09/19 1055 06/09/19 1159  HGB 6.7* 9.7*  --  9.9*  --   --  12.2  --   HCT 21.5* 31.1*  --  32.1*  --   --  39.4  --   PLT 175 199  --   --   --   --  198  --   LABPROT 19.1*  --  15.7*  --  13.6  --   --   --   INR 1.6*  --  1.3*  --  1.1  --   --   --   HEPARINUNFRC 0.22*  --   --   --   --  0.64  --  0.36  CREATININE 1.47* 1.24* 1.49*  --  0.81  --   --   --     Estimated Creatinine Clearance: 68.3 mL/min (by C-G formula based on SCr of 0.81 mg/dL).   Medical History: Past Medical History:  Diagnosis Date  . Acute respiratory failure with hypoxia ([place]) 05/2019  . Allergic rhinitis   . Anticoagulated on Coumadin   . Arthritis   . Diabetes mellitus   . GERD (gastroesophageal reflux disease)   . Hyperlipidemia   . Hypertension   . Pain in joint, ankle and foot 09/14/2013    Medications:  Scheduled:  . feeding supplement (ENSURE ENLIVE)  237 mL Oral TID BM  . feeding supplement (PRO-STAT SUGAR FREE 64)  30 mL Per Tube BID BM  . furosemide  20 mg Oral Daily  . insulin aspart  0-20 Units Subcutaneous Q4H  . insulin glargine  10 Units Subcutaneous QHS  . ipratropium-albuterol  3 mL Nebulization  TID  . methylPREDNISolone (SOLU-MEDROL) injection  40 mg Intravenous Daily  . metoprolol tartrate  25 mg Oral Q6H  . pantoprazole  40 mg Oral BID  . potassium & sodium phosphates  1 packet Oral TID WC & HS  . sodium chloride flush  10-40 mL Intracatheter Q12H    Assessment: 78 yo female on chronic Coumadin for afib, currently on hold for suspicion of GIB.  No Coumadin given since prior to admission. Heparin was held previously for the endoscopy.   Found to have acute DVT in L upper extremity. Plans noted for possible comfort care -heparin level at goal  Goal of Therapy:  Heparin level 0.3-0.5 units/ml Monitor platelets by anticoagulation protocol: Yes   Plan:  -No heparin changes needed -Daily heparin level and CBC  Hildred Laser, PharmD Clinical Pharmacist **Pharmacist phone directory can now be found on [email or web address] (PW [email or web address]).  Listed under [place].

## 2019-06-09 NOTE — Progress Notes (Signed)
Full note to follow  Had lengthy visit with patient son and dtr.  Code status changed to DNR.  Patient and family will likely choose [facility] for on going care, but son and dtr wanted to sleep on it tonight and speak with their brother.   Do not start TPN.  Continue current care for now.  Will confirm next steps with family tomorrow.  Likely head towards comfort measures only and Hospice House.  Florentina Jenny, PA-C Palliative Medicine Pager: [phone]

## 2019-06-09 NOTE — Progress Notes (Signed)
Daily Progress Note   Patient Name: Melanie [name]       Date: 06/09/2019 DOB: [birth year]  Age: 78 y.o. MRN#: [id number] Attending Physician: Hosie Poisson, MD Primary Care Physician: [name], MD Admit Date: 05/22/2019  Reason for Consultation/Follow-up: Establishing goals of care and Psychosocial/spiritual support  Subjective: Patient is lethargic on my first examination.  Once her two children arrived she perked up significantly and started eating a mango icee. After the children visited with their mother.  We met privately in the conference room.  RN Jarrett Soho was present and provided support during the meeting.  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  She was gardening.  She was a fiercely independent woman who cared for herself.  She moved in with [place] after her husband died in 01/31/11.  We reviewed her pre-existing medical conditions - CKD 3, esophageal stricture, hiatal hernia, atrial fibrillation.  We also reviewed other pre-existing conditions that were discovered this hospitalization - D-HF, PFO.  We talked about her hospitalization and the roller coaster ride it has been - how severe the pancreatitis has been and the cascade of problems it has caused.  Finally I explained that Melanie [name] is likely as good as we can possibly get her and offered Hospice in the home vs [facility]. Melanie [name] and Melanie [name] felt that they could not provide all of the care Melanie [name] will need at home and I agreed.  They chose [facility] but wanted to discuss it with Melanie [name] as well as their brother.   We returned to the room and attempted a conversation with Mrs.  [name] but she was very lethargic.  Thru her lethargy she agreed  with hospice house - but I recommended to Melanie [name] and Melanie [name] that they talk with their brother about it and sleep on it.   We discussed code status.  Melanie [name] both support DNR.   Assessment: Lethargic, less nausea, still with significant pain, fluid overloaded, on 6L of HFNC   Patient Profile/HPI:  78 y.o. female  with past medical history of DM2, esophageal stricture, CKD 3, obesity who was admitted on 05/22/2019 with gall stone pancreatitis (lipase of 5000).  She has been too unstable for surgery.  Her  recovery has been complicated by sepsis/sirs, GIB with elevated INR, recurrent pleural effusions (thoracentesis x 3), afib/aflutter, and esophageal dysphagia with the inability to place an N/G tube for nutrition and medication.  The patient continues to have severe nausea and various types of pain.  Length of Stay: 17  Current Medications: Scheduled Meds:  . feeding supplement (ENSURE ENLIVE)  237 mL Oral TID BM  . feeding supplement (PRO-STAT SUGAR FREE 64)  30 mL Per Tube BID BM  . furosemide  20 mg Oral Daily  . insulin aspart  0-20 Units Subcutaneous Q4H  . insulin glargine  10 Units Subcutaneous QHS  . ipratropium-albuterol  3 mL Nebulization TID  . methylPREDNISolone (SOLU-MEDROL) injection  40 mg Intravenous Daily  . metoprolol tartrate  25 mg Oral Q6H  . pantoprazole  40 mg Oral BID  . potassium & sodium phosphates  1 packet Oral TID WC & HS  . sodium chloride flush  10-40 mL Intracatheter Q12H    Continuous Infusions: . sodium chloride Stopped (06/07/19 2041)  . dextrose 50 mL/hr at 06/09/19 0934  . heparin 1,250 Units/hr (06/09/19 0934)    PRN Meds: acetaminophen **OR** acetaminophen, HYDROmorphone (DILAUDID) injection, lidocaine (PF), LORazepam, metoprolol tartrate, ondansetron **OR** ondansetron (ZOFRAN) IV, prochlorperazine, sodium chloride flush  Physical Exam      Chronically ill appearing obese lethargic female. CV irreg irreg Resp no distress Ext swollen   Vital Signs: BP 115/66 (BP Location: Right Arm)   Pulse 87   Temp (!) 97.5 F (36.4 C) (Oral)   Resp (!) 21   Ht 5' 4" (1.626 m)   Wt 103.9 kg Comment:  changed to  bariatric bed at am.  SpO2 100%   BMI 39.31 kg/m  SpO2: SpO2: 100 % O2 Device: O2 Device: High Flow Nasal Cannula O2 Flow Rate: O2 Flow Rate (L/min): 5 L/min  Intake/output summary:   Intake/Output Summary (Last 24 hours) at 06/09/2019 1714 Last data filed at 06/09/2019 1310 Gross per 24 hour  Intake 1394.41 ml  Output 1250 ml  Net 144.41 ml   LBM: Last BM Date: 06/09/19 Baseline Weight: Weight: 99.8 kg Most recent weight: Weight: 103.9 kg( changed to  bariatric bed at am.)       Palliative Assessment/Data: 20%      Patient Active Problem List   Diagnosis Date Noted  . Bilateral pleural effusion   . Palliative care encounter   . Pleural effusion on left   . Acute on chronic diastolic heart failure ([place])   . Dysphasia   . Abdominal discomfort   . Acute respiratory failure with hypoxia ([place])   . Cholelithiasis   . SOB (shortness of breath)   . Acute pancreatitis 05/23/2019  . Osteopenia 01/14/2018  . Encounter for therapeutic drug monitoring 01/01/2018  . Afib ([place]) 01/01/2018  . Recurrent cystitis 10/26/2017  . Family history of colon cancer 08/28/2015  . Dysphagia 08/21/2015  . GERD (gastroesophageal reflux disease) 06/14/2015  . Onychomycosis 09/14/2013  . Pain in joint, ankle and foot 09/14/2013  . Annual physical exam 07/06/2012  . Colon polyps 07/06/2012  . HTN (hypertension) 01/14/2012  . DM (diabetes mellitus) type II controlled, neurological manifestation ([place]) 01/14/2012  . Neuropathy of lower extremity 01/14/2012  . Neck pain 01/14/2012  . Hyperlipidemia 01/14/2012  . History of colonoscopy with polypectomy 11/06/2011    Palliative Care Plan    Recommendations/Plan:  Follow up with Melanie [name] tomorrow for a decision regarding comfort care and hospice house.  If she affirms  the  decision a social work referral to [facility] needs to be placed.  No TPN tonight.  I will not be here tomorrow.  However PMT will be able to support if needed.  [phone]  Code Status:  DNR  Prognosis:   < 2 weeks Severe pancreatitis with SSIRS, Hypoxic respiratory failure, barely drinking (not eating) still with nausea and vomiting.  D- HF with PFO and fluid overload with recurrent pleural effusions after 3 thoracentesis.   Discharge Planning:  Most likely [facility] but need confimation from family.  Care plan was discussed with Bedside RN, TR[facility] MD, Patient and family.  Thank you for allowing the Palliative Medicine Team to assist in the care of this patient.  Total time spent:  90 min Time in 12:00 Time out 1:30     Greater than 50%  of this time was spent counseling and coordinating care related to the above assessment and plan.  Florentina Jenny, PA-C Palliative Medicine  Please contact Palliative MedicineTeam phone at [phone] for questions and concerns between 7 am - 7 pm.   Please see AMION for individual provider pager numbers.

## 2019-06-10 DIAGNOSIS — R1084 Generalized abdominal pain: Secondary | ICD-10-CM

## 2019-06-10 DIAGNOSIS — Z515 Encounter for palliative care: Secondary | ICD-10-CM

## 2019-06-10 DIAGNOSIS — R110 Nausea: Secondary | ICD-10-CM

## 2019-06-10 LAB — GLUCOSE, CAPILLARY
Glucose-Capillary: 137 mg/dL — ABNORMAL HIGH (ref 70–99)
Glucose-Capillary: 139 mg/dL — ABNORMAL HIGH (ref 70–99)
Glucose-Capillary: 151 mg/dL — ABNORMAL HIGH (ref 70–99)
Glucose-Capillary: 169 mg/dL — ABNORMAL HIGH (ref 70–99)
Glucose-Capillary: 174 mg/dL — ABNORMAL HIGH (ref 70–99)
Glucose-Capillary: 193 mg/dL — ABNORMAL HIGH (ref 70–99)

## 2019-06-10 LAB — MAGNESIUM: Magnesium: 2.3 mg/dL (ref 1.7–2.4)

## 2019-06-10 NOTE — Progress Notes (Signed)
Palliative Medicine RN Note: Daughter [name] called; requested we add pt's son Marcha Licklider to visitation list. I have notified Main Desk/security.  Marjie Skiff Araina Butrick, RN, BSN, [facility] Palliative Medicine Team 06/10/2019 10:44 AM Office [phone]

## 2019-06-10 NOTE — Progress Notes (Signed)
CSW called and made a referral with Bevely Palmer with [facility]. They have already received the referral from palliative. They do not have any beds available today. They may have some open up over the weekend.   CSW will continue to follow and assist with discharge planning.   Domenic Schwab, MSW, [place]

## 2019-06-10 NOTE — Care Management Important Message (Signed)
Important Message  Patient Details  Name: Melanie [name] MRN: [id number] Date of Birth: 16-Jul-1941   Medicare Important Message Given:  Yes     [name] 06/10/2019, 12:01 PM

## 2019-06-10 NOTE — Progress Notes (Signed)
PROGRESS NOTE    Melanie [name]  UYQ:[id number] DOB: [birth year] DOA: 05/22/2019 PCP: [name], MD   Brief Narrative:  78 year old WF PMHx dementia, diabetes type 2 uncontrolled with complication, DM neuropathy, HLD, essential HTN, A. fib on warfarin, dysphagia, cholelithiasis, colon polyps,  Admitted on5/31/2020, presented with complaint of epigastric abdominal pain nausea and vomiting, was found to have acute pancreatitis as well as sepsis of unknown origin.   Assessment & Plan:   Principal Problem:   Acute pancreatitis Active Problems:   HTN (hypertension)   DM (diabetes mellitus) type II controlled, neurological manifestation (HCC)   Afib (HCC)   SOB (shortness of breath)   Abdominal discomfort   Acute respiratory failure with hypoxia (HCC)   Cholelithiasis   Pleural effusion on left   Acute on chronic diastolic heart failure (HCC)   Dysphasia   Bilateral pleural effusion   Palliative care encounter   Goals of care, counseling/discussion   Nausea without vomiting   Generalized abdominal pain   Palliative care by specialist   Acute biliary pancreatitis Unable to perform or consider lap chole at this time. Lipase wnl.  Per GI note 6/9 patient will require surgical referral/consultation to consider a cholecystectomy prior to discharge as result of previously felt pancreatitis due to likely biliary/gallstone disease that occurred upon her initial admission.  MRCP negative for CBD dilation or CBD stone In view of clinical deterioration, and after palliative meeting on 6/18, transition to residential hopsice as per family and patient request.   Dysphagia,  Unclear etiology.  Possibly esophageal stricture.  IR unable to place [place].  GI unable to do EGD/ sedation, as they feel she would be intubated for sedation and would not be able to wean off or extubated.  GI has signed off.   Sepsis from CAP / aspiration pneumonia Duo nebs as needed.   Hypokalemia   Replaced.     Hypophosphatemia  Replaced.   Chronic atrial fib with RVR:  Rate controlled with BB.    Bilateral pleural effusions,  Underwent right 6/8 and left thoracentesis on 6/9, 6/14, repeat CXR shows residual effusion. Pleural fluid cultures negative so far.     Left upper extremity swelling: Venous duplex revealed acute DVT and SVT on the left upper extremity.  Heparin was started but had to be discontinued as patient and family agree to be transitioned to hospice and comfort measures.   In view of the multiple medical problems, clinical deterioration, poor nutrition and poor functional status, palliative care consulted and plan to transition to residential  hospice in the next 24 hours.      DVT prophylaxis:  scd's Code Status: DNR Family Communication: none at bedside Disposition Plan: residential hospice.    Consultants:   Gastroenterology  Cardiology  Palliative care.    Procedures/Significant Events:  6/4 EEG: Abnormal EEG due to the presence ofgeneral background slowing andtriphasic waves. This isseen most commonly in encephalopathic states, most commonly, but not limited to, metabolic encephalopathies 6/5 echocardiogram:-The left ventricle has hyperdynamic systolic function, with an ejection fraction of >65%.-The cavity size was decreased.  -Left Atrium: moderate to severely enlarged . -Right Atrium: mildly dilated. pressure is estimated at 10 mmHg. 6/8 RIGHT thoracentesis.: 1.2 L aspirated 6/9 LEFT thoracentesis; 642ml 6/11 PCXR:-Improved airspace disease in the RIGHT lung. - Persistent LEFT effusion and basilar atelectasis. -Hiatal hernia 6/12 echocardiogram with bubble study: Abnormal bubble contrast study.  Interstitial shunt noted suggestive of patent foramen ovale -LVEF 55 - 60% 6/13 PCXR: Lucency  left lower mediastinum corresponds to known hiatal hernia -Moderately large LEFT pleural effusion has increased in size with worsening  atelectasis or consolidation in the left lung -Mild patchy airspace opacity persists right lung  6/14 LEFT thoracentesis: 320 cc 6/16 transfuse 1 unit PRBC   Antimicrobials:  None   Subjective: Lethargic, minimal conversative.   Objective: Vitals:   06/10/19 0426 06/10/19 0818 06/10/19 0903 06/10/19 1219  BP: 100/62 (!) 116/96  119/60  Pulse: 93 94  96  Resp: 16 14  20   Temp: 97.6 F (36.4 C)   98.2 F (36.8 C)  TempSrc: Oral Oral  Oral  SpO2: 100% 100% 100% 100%  Weight: 112.5 kg     Height:        Intake/Output Summary (Last 24 hours) at 06/10/2019 1333 Last data filed at 06/10/2019 0531 Gross per 24 hour  Intake 1109.35 ml  Output 650 ml  Net 459.35 ml   Filed Weights   06/08/19 0334 06/09/19 0310 06/10/19 0426  Weight: 99.8 kg 103.9 kg 112.5 kg    Examination:  General exam: Appears calm and comfortable , does not appear to be in distress.  Respiratory system: diminished at bases Cardiovascular system: S1 & S2 heard,  Irregular.  Gastrointestinal system: Abdomen is soft tender, distended.  Central nervous system:  Lethargic, not able to perform full neuro evaluation.  Extremities: left lower extremity swelling and tenderness> right lower extremity swelling.  Skin: see above. Psychiatry: cannot be assessed.    Data Reviewed: I have personally reviewed following labs and imaging studies  CBC: Recent Labs  Lab 06/05/19 0517 06/06/19 0620 06/06/19 2337 06/07/19 1101 06/08/19 1642 06/09/19 1055  WBC 14.6* 12.0* 12.4* 17.4*  --  26.6*  NEUTROABS  --   --   --   --   --  25.5*  HGB 8.0* 8.2* 6.7* 9.7* 9.9* 12.2  HCT 25.7* 26.6* 21.5* 31.1* 32.1* 39.4  MCV 96.6 97.8 96.8 97.5  --  96.6  PLT 193 223 175 199  --  562   Basic Metabolic Panel: Recent Labs  Lab 06/06/19 0904 06/06/19 2337 06/07/19 1101 06/08/19 0457 06/09/19 0009 06/10/19 1035  NA 153* 151* 151* 147* 137  --   K 4.1 3.7 3.9 4.1 3.0*  --   CL 121* 120* 121* 116* 103  --   CO2 20*  22 24 22 23   --   GLUCOSE 188* 260* 150* 149* 120*  --   BUN 52* 55* 52* 54* 22  --   CREATININE 1.59* 1.47* 1.24* 1.49* 0.81  --   CALCIUM 8.3* 8.4* 8.3* 8.5* 7.8*  --   MG 2.6* 2.5* 2.6* 2.6* 1.9 2.3  PHOS  --   --   --   --  1.3*  --    GFR: Estimated Creatinine Clearance: 71.4 mL/min (by C-G formula based on SCr of 0.81 mg/dL). Liver Function Tests: Recent Labs  Lab 06/04/19 0746 06/05/19 0517 06/06/19 0904 06/09/19 0009  AST 24  --  34 23  ALT 25  --  54* 19  ALKPHOS 75  --  76 46  BILITOT 1.5*  --  0.8 0.6  PROT 5.3*  --  5.1* 4.8*  ALBUMIN 2.8* 3.3* 2.9* 2.5*   Recent Labs  Lab 06/06/19 0904  LIPASE 30   Recent Labs  Lab 06/04/19 0313  AMMONIA 33   Coagulation Profile: Recent Labs  Lab 06/05/19 0957 06/06/19 0620 06/06/19 2337 06/08/19 0457 06/09/19 0009  INR 1.6* 1.6* 1.6*  1.3* 1.1   Cardiac Enzymes: No results for input(s): CKTOTAL, CKMB, CKMBINDEX, TROPONINI in the last 168 hours. BNP (last 3 results) No results for input(s): PROBNP in the last 8760 hours. HbA1C: No results for input(s): HGBA1C in the last 72 hours. CBG: Recent Labs  Lab 06/09/19 2006 06/10/19 0000 06/10/19 0430 06/10/19 0814 06/10/19 1133  GLUCAP 192* 174* 193* 169* 151*   Lipid Profile: Recent Labs    06/09/19 0137  TRIG 210*   Thyroid Function Tests: No results for input(s): TSH, T4TOTAL, FREET4, T3FREE, THYROIDAB in the last 72 hours. Anemia Panel: No results for input(s): VITAMINB12, FOLATE, FERRITIN, TIBC, IRON, RETICCTPCT in the last 72 hours. Sepsis Labs: No results for input(s): PROCALCITON, LATICACIDVEN in the last 168 hours.  Recent Results (from the past 240 hour(s))  Culture, body fluid-bottle     Status: None   Collection Time: 05/31/19  2:02 PM   Specimen: Pleura  Result Value Ref Range Status   Specimen Description PLEURAL  Final   Special Requests FLUID  Final   Culture   Final    NO GROWTH 5 DAYS Performed at [facility], [address], [place], [place] [address]    Report Status 06/05/2019 FINAL  Final  Gram stain     Status: None   Collection Time: 05/31/19  2:02 PM   Specimen: Pleura  Result Value Ref Range Status   Specimen Description PLEURAL  Final   Special Requests FLUID  Final   Gram Stain   Final    WBC PRESENT,BOTH PMN AND MONONUCLEAR NO ORGANISMS SEEN CYTOSPIN SMEAR Performed at [facility], [address] [place], [place] [address]    Report Status 05/31/2019 FINAL  Final  Respiratory Panel by PCR     Status: None   Collection Time: 06/03/19 10:07 AM   Specimen: Nasopharyngeal Swab; Respiratory  Result Value Ref Range Status   Adenovirus NOT DETECTED NOT DETECTED Final   Coronavirus 229E NOT DETECTED NOT DETECTED Final    Comment: (NOTE) The Coronavirus on the Respiratory Panel, DOES NOT test for the novel  Coronavirus (2019 nCoV)    Coronavirus HKU1 NOT DETECTED NOT DETECTED Final   Coronavirus NL63 NOT DETECTED NOT DETECTED Final   Coronavirus OC43 NOT DETECTED NOT DETECTED Final   Metapneumovirus NOT DETECTED NOT DETECTED Final   Rhinovirus / Enterovirus NOT DETECTED NOT DETECTED Final   Influenza A NOT DETECTED NOT DETECTED Final   Influenza B NOT DETECTED NOT DETECTED Final   Parainfluenza Virus 1 NOT DETECTED NOT DETECTED Final   Parainfluenza Virus 2 NOT DETECTED NOT DETECTED Final   Parainfluenza Virus 3 NOT DETECTED NOT DETECTED Final   Parainfluenza Virus 4 NOT DETECTED NOT DETECTED Final   Respiratory Syncytial Virus NOT DETECTED NOT DETECTED Final   Bordetella pertussis NOT DETECTED NOT DETECTED Final   Chlamydophila pneumoniae NOT DETECTED NOT DETECTED Final   Mycoplasma pneumoniae NOT DETECTED NOT DETECTED Final    Comment: Performed at [facility], [place]. [address], [place], [place] [address]  SARS Coronavirus 2     Status: None   Collection Time: 06/05/19  9:05 AM  Result Value Ref Range Status   SARS Coronavirus 2 NOT DETECTED NOT DETECTED Final    Comment:  (NOTE) SARS-CoV-2 target nucleic acids are NOT DETECTED. The SARS-CoV-2 RNA is generally detectable in upper and lower respiratory specimens during the acute phase of infection.  Negative  results do not preclude SARS-CoV-2 infection, do not  rule out co-infections with other pathogens, and should not be used as the sole basis for treatment or other patient management decisions.  Negative results must be combined with clinical observations, patient history, and epidemiological information. The expected result is Not Detected. Fact Sheet for Patients: http://www.biofiredefense.com/wp-content/uploads/2020/03/BIOFIRE-COVID -19-patients.pdf Fact Sheet for Healthcare Providers: http://www.biofiredefense.com/wp-content/uploads/2020/03/BIOFIRE-COVID -19-hcp.pdf This test is not yet approved or cleared by the [place] and  has been authorized for detection and/or diagnosis of SARS-CoV-2 by FDA under an Emergency Use Authorization (EUA).  This EUA will remain in effec t (meaning this test can be used) for the duration of  the COVID-19 declaration under Section 564(b)(1) of the Act, 21 U.S.C. section 360bbb-3(b)(1), unless the authorization is terminated or revoked sooner. Performed at [facility], [place] [address], [place], [place] [address]   Gram stain     Status: None   Collection Time: 06/05/19 12:49 PM   Specimen: Pleural, Left; Pleural Fluid  Result Value Ref Range Status   Specimen Description PLEURAL LEFT  Final   Special Requests NONE  Final   Gram Stain   Final    FEW WBC PRESENT, PREDOMINANTLY MONONUCLEAR NO ORGANISMS SEEN Performed at [facility], [place] [address], [place], [place] [address]    Report Status 06/06/2019 FINAL  Final  Fungus Culture With Stain     Status: None (Preliminary result)   Collection Time: 06/05/19 12:49 PM   Specimen: Pleural, Left; Pleural Fluid  Result Value Ref Range Status   Fungus Stain Final report  Final    Comment:  (NOTE) Performed At: [facility] [place], [place] [id number] Rush Farmer MD QQ:[id number]    Fungus (Mycology) Culture PENDING  Incomplete   Fungal Source PLEURAL  Final    Comment: LEFT Performed at [facility], [place] [address], [place], [place] [address]   Fungus Culture Result     Status: None   Collection Time: 06/05/19 12:49 PM  Result Value Ref Range Status   Result 1 Comment  Final    Comment: (NOTE) KOH/Calcofluor preparation:  no fungus observed. Performed At: [facility] [place], [place] [id number] Rush Farmer MD UD:[id number]          Radiology Studies: Dg Chest Port 1 View  Result Date: 06/08/2019 CLINICAL DATA:  Sepsis EXAM: PORTABLE CHEST 1 VIEW COMPARISON:  06/05/2019, 06/04/2019, 06/02/2019 FINDINGS: Small moderate left greater than right pleural effusions. Worsening airspace disease at the right base. Persistent airspace disease at the left lung base. Stable enlarged cardiomediastinal silhouette. Asymmetric opacity in the right hilus. No pneumothorax. IMPRESSION: 1. Small moderate bilateral left greater than right pleural effusions, increased on the right side 2. Worsening airspace disease at the right lung base with continued consolidation at the left base, atelectasis versus pneumonia 3. Cardiomegaly. Asymmetric right hilar opacity, could be due to rotation and prominent pulmonary artery, however recommend dedicated two-view chest when clinically feasible. Electronically Signed   By: Donavan Foil M.D.   On: 06/08/2019 19:29   Vas [place] Upper Extremity Venous Duplex  Result Date: 06/08/2019 UPPER VENOUS STUDY  Indications: Edema Comparison Study: no prior Performing Technologist: June Leap RDMS, RVT  Examination Guidelines: A complete evaluation includes B-mode imaging, spectral Doppler, color Doppler, and power Doppler as needed of all accessible portions of each vessel. Bilateral testing is considered an  integral part of a complete examination. Limited examinations for reoccurring indications may be performed as noted.  Right Findings: +----------+------------+---------+-----------+----------+-------+ RIGHT  CompressiblePhasicitySpontaneousPropertiesSummary +----------+------------+---------+-----------+----------+-------+ Subclavian               Yes       Yes                      +----------+------------+---------+-----------+----------+-------+  Left Findings: +----------+------------+---------+-----------+----------+--------------+ LEFT      CompressiblePhasicitySpontaneousProperties   Summary     +----------+------------+---------+-----------+----------+--------------+ IJV                                                 Not visualized +----------+------------+---------+-----------+----------+--------------+ Subclavian    Full       Yes       Yes                             +----------+------------+---------+-----------+----------+--------------+ Axillary      Full       Yes       Yes                             +----------+------------+---------+-----------+----------+--------------+ Brachial      None       No        No               PICC LOCATION  +----------+------------+---------+-----------+----------+--------------+ Radial        Full                                                 +----------+------------+---------+-----------+----------+--------------+ Ulnar         Full                                                 +----------+------------+---------+-----------+----------+--------------+ Cephalic      None                                                 +----------+------------+---------+-----------+----------+--------------+ Basilic                                             Not visualized +----------+------------+---------+-----------+----------+--------------+  Summary:  Right: No evidence of thrombosis in the  subclavian.  Left: Findings consistent with acute deep vein thrombosis involving the left brachial veins. Findings consistent with acute superficial vein thrombosis involving the left cephalic vein.  *See table(s) above for measurements and observations.  Diagnosing physician: Curt Jews MD Electronically signed by Curt Jews MD on 06/08/2019 at 5:10:03 PM.    Final         Scheduled Meds: . feeding supplement (ENSURE ENLIVE)  237 mL Oral TID BM  . feeding supplement (PRO-STAT SUGAR FREE 64)  30 mL Per Tube BID BM  . furosemide  20 mg Oral Daily  . insulin aspart  0-20 Units Subcutaneous Q4H  . insulin  glargine  10 Units Subcutaneous QHS  . ipratropium-albuterol  3 mL Nebulization TID  . metoprolol tartrate  25 mg Oral Q6H  . pantoprazole  40 mg Oral BID  . potassium & sodium phosphates  1 packet Oral TID WC & HS  . sodium chloride flush  10-40 mL Intracatheter Q12H   Continuous Infusions: . sodium chloride Stopped (06/07/19 2041)  . dextrose 50 mL/hr at 06/10/19 0630     LOS: 18 days    Time spent: 30 minutes    Hosie Poisson, MD [facility] Pager [phone]  If 7PM-7AM, please contact night-coverage [email or web address] Password [email or web address] 06/10/2019, 1:33 PM

## 2019-06-10 NOTE — Progress Notes (Addendum)
Palliative medicine progress note  Patient seen, chart reviewed.  Patient appears very ill; lying with a washcloth around for her forehead.  She is still complaining of nausea as well as abdominal pain.  She is unable to take anything by mouth without precipitating worsening nausea and abdominal pain.  I did update patient's daughter,Donna Siler, this a.m. on the phone.  We discussed further disposition options in the setting acute pancreatitis with SIRS, hypoxic respiratory failure, recurrent pleural effusions requiring thoracentesis x3, worsening dysphasia, GI bleed in the setting of  multiple comorbidities: d, afib, CKD 3CHF  Patient is no longer able to take anything by mouth.  She does not wish to pursue artificial feeding either through core track or PEG tube  Patient Profile/HPI:  78 y.o.femalewith past medical history of DM2, esophageal stricture, CKD 3, obesitywho was admitted on 5/31/2020with gall stone pancreatitis (lipase of 5000). She has been too unstable for surgery. Her recovery has been complicated by sepsis/sirs, GIB with elevated INR, recurrent pleural effusions (thoracentesis x 3), afib/aflutter, and esophageal dysphagia with the inability to place an N/G tube for nutrition and medication. The patient continues to have severe nausea and various types of pain.  Consult ordered for goals of care.  Despite multiple interventions and supportive care patient has continued to decline.  Family is hopeful for residential hospice for end-of-life care  Plan DC to residential hospice.  Social work consult placed as well as direct conversation with hospice and palliative care of [facility] liaison.  Offered choice per Medicare guidelines: First choice is [facility], second choice is hospice [facility]  Continue with Dilaudid IV for abdominal pain secondary to acute pancreatitis.  Monitor and for need for scheduled dosing or drip  Continue with Compazine for  nausea.  Disposition Residential hospice  Prognosis Less than 2 weeks in the setting of severe pancreatitis (not resolving) with SIRS, hypoxic respiratory failure, patient is no longer able to eat or drink because of nausea and associated pain; comorbidities of diastolic heart failure chronic kidney disease stage III as well as recurrent pleural effusions requiring 3 thoracentesis  Thank you Romona Curls, NP Total time: 40 minutes Greater than 50% of time was spent in counseling and coordination of care

## 2019-06-10 NOTE — Plan of Care (Addendum)
Care plan reviewed: supportive treatment for DNR Pt with poor progressive level.  Problem: Clinical Measurements: auscultated both lungs sound: rhonchi and coarse crepitation, with weak non productive cough. Goal: Respiratory complications will improve Outcome: Progressing, on HFNCL 5 LPM, SPO2 100% with no acute respiratory distress.   Problem: Clinical Measurements: Atrial Fibrillation on monitor with rated under controlled. Goal: Cardiovascular complication will be avoided Outcome: Progressing: hemodynamics stable   Problem: Elimination: Continue using Flexiseal rectal tube, incontinent black watery bowel movement, using Foley catheter.  Goal: Will not experience complications related to bowel motility and urine retention. Outcome: Progressing: small amount of BM today, and fair amount of urine output.    Problem: Pain Managment: Pt stated had generalized pain. Goal: General experience of comfort will improve Outcome: Progressing: Dilaudid 0.5 mg given one dose tonight and after one dose given,she slept well with no complaint of pain.   Problem: Skin Integrity: limited movement, obese and lethargic, 3+ pitting edema bilateral upper extremities. Goal: Risk for impaired skin integrity will decrease Outcome: Progressing: continue using bariatric rotating air mattress, no pressure ulcer had seen. Position changed q 2hr.    Problem: Activity: Dependent, appeared lethargic, more drowsy today. Goal: Risk for activity intolerance will decrease Outcome: Not Progressing   Problem: Nutrition: little to no appetite, continued refusing to eat her meal. Electrolyte imbalance: ( K 3.0 ) Goal: Adequate nutrition will be maintained Outcome: Not Progressing: continue infusing Dextrose 10% at 50 ml/hr. She got potassium supplement today. Blood glucose checking q 4hr, remained high, required insulin Lantus and Novolog coveraged.  Kennyth Lose, RN

## 2019-06-10 NOTE — Progress Notes (Signed)
Manufacturing engineer [facility]) Hospital Liaison note.   Received request from [place], [place] for family interest in [facility]. Chart reviewed and spoke with family to acknowledge referral.  Unfortunately [facility] is not able to offer a room today. Family and CSW are aware [facility] liaison will follow up with CSW and family tomorrow or sooner if room becomes available. Please do not hesitate to call with questions.   Thank you,    Farrel Gordon, RN, [facility]   [place]     [place] are on AMION

## 2019-06-11 LAB — GLUCOSE, CAPILLARY
Glucose-Capillary: 114 mg/dL — ABNORMAL HIGH (ref 70–99)
Glucose-Capillary: 124 mg/dL — ABNORMAL HIGH (ref 70–99)
Glucose-Capillary: 151 mg/dL — ABNORMAL HIGH (ref 70–99)
Glucose-Capillary: 161 mg/dL — ABNORMAL HIGH (ref 70–99)
Glucose-Capillary: 168 mg/dL — ABNORMAL HIGH (ref 70–99)
Glucose-Capillary: 98 mg/dL (ref 70–99)

## 2019-06-11 NOTE — Progress Notes (Signed)
CSW received notification from Eva, [facility] Social Worker, that there are no beds available today at [facility], hopefully within in the next few days.   CSW will continue to follow.   Moncerrat Burnstein, MSW, LCSW-A Clinical Social Worker [facility]  

## 2019-06-11 NOTE — Progress Notes (Signed)
PROGRESS NOTE    Melanie [name]  FXT:[id number] DOB: [birth year] DOA: 05/22/2019 PCP: [name], MD   Brief Narrative:  78 year old WF PMHx dementia, diabetes type 2 uncontrolled with complication, DM neuropathy, HLD, essential HTN, A. fib on warfarin, dysphagia, cholelithiasis, colon polyps,  Admitted on5/31/2020, presented with complaint of epigastric abdominal pain nausea and vomiting, was found to have acute pancreatitis as well as sepsis of unknown origin.   Assessment & Plan:   Principal Problem:   Acute pancreatitis Active Problems:   HTN (hypertension)   DM (diabetes mellitus) type II controlled, neurological manifestation (HCC)   Afib (HCC)   SOB (shortness of breath)   Abdominal discomfort   Acute respiratory failure with hypoxia (HCC)   Cholelithiasis   Pleural effusion on left   Acute on chronic diastolic heart failure (HCC)   Dysphasia   Bilateral pleural effusion   Palliative care encounter   Goals of care, counseling/discussion   Nausea without vomiting   Generalized abdominal pain   Palliative care by specialist   Acute biliary pancreatitis Unable to perform or consider lap chole at this time. Lipase wnl.  Per GI note 6/9 patient will require surgical referral/consultation to consider a cholecystectomy prior to discharge as result of previously felt pancreatitis due to likely biliary/gallstone disease that occurred upon her initial admission.  MRCP negative for CBD dilation or CBD stone In view of clinical deterioration, and after palliative meeting on 6/18, transition to residential hopsice as per family and patient request.   Dysphagia,  Unclear etiology.  Possibly esophageal stricture.  IR unable to place [place].  GI unable to do EGD/ sedation, as they feel she would be intubated for sedation and would not be able to wean off or extubated.  GI has signed off.   Sepsis from CAP / aspiration pneumonia Duo nebs as needed.   Hypokalemia   Replaced.     Hypophosphatemia  Replaced.   Chronic atrial fib with RVR:  Rate controlled with BB.    Bilateral pleural effusions,  Underwent right 6/8 and left thoracentesis on 6/9, 6/14, repeat CXR shows residual effusion. Pleural fluid cultures negative so far.     Left upper extremity swelling: Venous duplex revealed acute DVT and SVT on the left upper extremity.  Heparin was started but had to be discontinued as patient and family agree to be transitioned to hospice and comfort measures.   In view of the multiple medical problems, clinical deterioration, poor nutrition and poor functional status, palliative care consulted and plan to transition to residential  hospice in the next 24 hours.      DVT prophylaxis:  scd's Code Status: DNR Family Communication: none at bedside Disposition Plan:  Awaiting residential hospice.    Consultants:   Gastroenterology  Cardiology  Palliative care.    Procedures/Significant Events:  6/4 EEG: Abnormal EEG due to the presence ofgeneral background slowing andtriphasic waves. This isseen most commonly in encephalopathic states, most commonly, but not limited to, metabolic encephalopathies 6/5 echocardiogram:-The left ventricle has hyperdynamic systolic function, with an ejection fraction of >65%.-The cavity size was decreased.  -Left Atrium: moderate to severely enlarged . -Right Atrium: mildly dilated. pressure is estimated at 10 mmHg. 6/8 RIGHT thoracentesis.: 1.2 L aspirated 6/9 LEFT thoracentesis; 667ml 6/11 PCXR:-Improved airspace disease in the RIGHT lung. - Persistent LEFT effusion and basilar atelectasis. -Hiatal hernia 6/12 echocardiogram with bubble study: Abnormal bubble contrast study.  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   Objective: Vitals:   06/11/19 0900 06/11/19 1113 06/11/19 1329 06/11/19 1608  BP:  (!) 146/81  112/89  Pulse: (!) 107 (!) 108  82  Resp: 18 18  17   Temp:  98.1 F (36.7 C)  98.1 F (36.7 C)  TempSrc:  Oral  Oral  SpO2: 100% 100% 100% 100%  Weight:      Height:        Intake/Output Summary (Last 24 hours) at 06/11/2019 1843 Last data filed at 06/11/2019 1609 Gross per 24 hour  Intake 1215.4 ml  Output 1000 ml  Net 215.4 ml   Filed Weights   06/08/19 0334 06/09/19 0310 06/10/19 0426  Weight: 99.8 kg 103.9 kg 112.5 kg    Examination:  General exam: Appears calm and comfortable , does not appear to be in distress.  Respiratory system: diminished at bases Cardiovascular system: S1 & S2 heard,  Irregular.  Gastrointestinal system: Abdomen is soft tender, distended.  Central nervous system:  Lethargic, not able to perform full neuro evaluation.  Extremities: left lower extremity swelling and tenderness> right lower extremity swelling.  Skin: see above. Psychiatry: cannot be assessed.    Data Reviewed: I have personally reviewed following labs and imaging studies  CBC: Recent Labs  Lab 06/05/19 0517 06/06/19 0620 06/06/19 2337 06/07/19 1101 06/08/19 1642 06/09/19 1055  WBC 14.6* 12.0* 12.4* 17.4*  --  26.6*  NEUTROABS  --   --   --   --   --  25.5*  HGB 8.0* 8.2* 6.7* 9.7* 9.9* 12.2  HCT 25.7* 26.6* 21.5* 31.1* 32.1* 39.4  MCV 96.6 97.8 96.8 97.5  --  96.6  PLT 193 223 175 199  --  283   Basic Metabolic Panel: Recent Labs  Lab 06/06/19 0904 06/06/19 2337 06/07/19 1101 06/08/19 0457 06/09/19 0009 06/10/19 1035  NA 153* 151* 151* 147* 137  --   K 4.1 3.7 3.9 4.1 3.0*  --   CL 121* 120* 121* 116* 103  --   CO2  20* 22 24 22 23   --   GLUCOSE 188* 260* 150* 149* 120*  --   BUN 52* 55* 52* 54* 22  --   CREATININE 1.59* 1.47* 1.24* 1.49* 0.81  --   CALCIUM 8.3* 8.4* 8.3* 8.5* 7.8*  --   MG 2.6* 2.5* 2.6* 2.6* 1.9 2.3  PHOS  --   --   --   --  1.3*  --    GFR: Estimated Creatinine Clearance: 71.4 mL/min (by C-G formula based on SCr of 0.81 mg/dL). Liver Function Tests: Recent Labs  Lab 06/05/19 0517 06/06/19 0904 06/09/19 0009  AST  --  34 23  ALT  --  54* 19  ALKPHOS  --  76 46  BILITOT  --  0.8 0.6  PROT  --  5.1* 4.8*  ALBUMIN 3.3* 2.9* 2.5*   Recent Labs  Lab 06/06/19 0904  LIPASE 30   No results for input(s): AMMONIA in the last 168 hours. Coagulation Profile: Recent Labs  Lab 06/05/19 0957 06/06/19 0620 06/06/19 2337 06/08/19 0457 06/09/19 0009  INR 1.6* 1.6* 1.6* 1.3* 1.1   Cardiac Enzymes:  No results for input(s): CKTOTAL, CKMB, CKMBINDEX, TROPONINI in the last 168 hours. BNP (last 3 results) No results for input(s): PROBNP in the last 8760 hours. HbA1C: No results for input(s): HGBA1C in the last 72 hours. CBG: Recent Labs  Lab 06/10/19 2359 06/11/19 0410 06/11/19 0735 06/11/19 1111 06/11/19 1604  GLUCAP 151* 124* 98 114* 168*   Lipid Profile: Recent Labs    06/09/19 0137  TRIG 210*   Thyroid Function Tests: No results for input(s): TSH, T4TOTAL, FREET4, T3FREE, THYROIDAB in the last 72 hours. Anemia Panel: No results for input(s): VITAMINB12, FOLATE, FERRITIN, TIBC, IRON, RETICCTPCT in the last 72 hours. Sepsis Labs: No results for input(s): PROCALCITON, LATICACIDVEN in the last 168 hours.  Recent Results (from the past 240 hour(s))  Respiratory Panel by PCR     Status: None   Collection Time: 06/03/19 10:07 AM   Specimen: Nasopharyngeal Swab; Respiratory  Result Value Ref Range Status   Adenovirus NOT DETECTED NOT DETECTED Final   Coronavirus 229E NOT DETECTED NOT DETECTED Final    Comment: (NOTE) The Coronavirus on the Respiratory Panel,  DOES NOT test for the novel  Coronavirus (2019 nCoV)    Coronavirus HKU1 NOT DETECTED NOT DETECTED Final   Coronavirus NL63 NOT DETECTED NOT DETECTED Final   Coronavirus OC43 NOT DETECTED NOT DETECTED Final   Metapneumovirus NOT DETECTED NOT DETECTED Final   Rhinovirus / Enterovirus NOT DETECTED NOT DETECTED Final   Influenza A NOT DETECTED NOT DETECTED Final   Influenza B NOT DETECTED NOT DETECTED Final   Parainfluenza Virus 1 NOT DETECTED NOT DETECTED Final   Parainfluenza Virus 2 NOT DETECTED NOT DETECTED Final   Parainfluenza Virus 3 NOT DETECTED NOT DETECTED Final   Parainfluenza Virus 4 NOT DETECTED NOT DETECTED Final   Respiratory Syncytial Virus NOT DETECTED NOT DETECTED Final   Bordetella pertussis NOT DETECTED NOT DETECTED Final   Chlamydophila pneumoniae NOT DETECTED NOT DETECTED Final   Mycoplasma pneumoniae NOT DETECTED NOT DETECTED Final    Comment: Performed at [facility], [address] [place], [place] [address]  SARS Coronavirus 2     Status: None   Collection Time: 06/05/19  9:05 AM  Result Value Ref Range Status   SARS Coronavirus 2 NOT DETECTED NOT DETECTED Final    Comment: (NOTE) SARS-CoV-2 target nucleic acids are NOT DETECTED. The SARS-CoV-2 RNA is generally detectable in upper and lower respiratory specimens during the acute phase of infection.  Negative  results do not preclude SARS-CoV-2 infection, do not rule out co-infections with other pathogens, and should not be used as the sole basis for treatment or other patient management decisions.  Negative results must be combined with clinical observations, patient history, and epidemiological information. The expected result is Not Detected. Fact Sheet for Patients: http://www.biofiredefense.com/wp-content/uploads/2020/03/BIOFIRE-COVID -19-patients.pdf Fact Sheet for Healthcare Providers: http://www.biofiredefense.com/wp-content/uploads/2020/03/BIOFIRE-COVID -19-hcp.pdf This test is not yet  approved or cleared by the [place] and  has been authorized for detection and/or diagnosis of SARS-CoV-2 by FDA under an Emergency Use Authorization (EUA).  This EUA will remain in effec t (meaning this test can be used) for the duration of  the COVID-19 declaration under Section 564(b)(1) of the Act, 21 U.S.C. section 360bbb-3(b)(1), unless the authorization is terminated or revoked sooner. Performed at [facility], [place] [address] [place], [place] [address]   Gram stain     Status: None   Collection Time: 06/05/19 12:49 PM   Specimen: Pleural, Left; Pleural Fluid  Result  Value Ref Range Status   Specimen Description PLEURAL LEFT  Final   Special Requests NONE  Final   Gram Stain   Final    FEW WBC PRESENT, PREDOMINANTLY MONONUCLEAR NO ORGANISMS SEEN Performed at [facility], [address] [place], [place] [address]    Report Status 06/06/2019 FINAL  Final  Fungus Culture With Stain     Status: None (Preliminary result)   Collection Time: 06/05/19 12:49 PM   Specimen: Pleural, Left; Pleural Fluid  Result Value Ref Range Status   Fungus Stain Final report  Final    Comment: (NOTE) Performed At: [facility] [place], [place] [id number] Rush Farmer MD KV:[id number]    Fungus (Mycology) Culture PENDING  Incomplete   Fungal Source PLEURAL  Final    Comment: LEFT Performed at [facility], [place] [address], [place], [place] [address]   Fungus Culture Result     Status: None   Collection Time: 06/05/19 12:49 PM  Result Value Ref Range Status   Result 1 Comment  Final    Comment: (NOTE) KOH/Calcofluor preparation:  no fungus observed. Performed At: [facility] [place], [place] [id number] Rush Farmer MD AY:[id number]          Radiology Studies: No results found.      Scheduled Meds: . feeding supplement (ENSURE ENLIVE)  237 mL Oral TID BM  . feeding supplement (PRO-STAT SUGAR FREE  64)  30 mL Per Tube BID BM  . furosemide  20 mg Oral Daily  . insulin aspart  0-20 Units Subcutaneous Q4H  . insulin glargine  10 Units Subcutaneous QHS  . ipratropium-albuterol  3 mL Nebulization TID  . metoprolol tartrate  25 mg Oral Q6H  . pantoprazole  40 mg Oral BID  . potassium & sodium phosphates  1 packet Oral TID WC & HS  . sodium chloride flush  10-40 mL Intracatheter Q12H   Continuous Infusions: . sodium chloride Stopped (06/07/19 2041)  . dextrose 50 mL/hr at 06/11/19 0600     LOS: 19 days    Time spent: 30 minutes    Hosie Poisson, MD [facility] Pager [phone]  If 7PM-7AM, please contact night-coverage [email or web address] Password [facility] 06/11/2019, 6:43 PM

## 2019-06-11 NOTE — Plan of Care (Signed)
Care plans reviewed and patient is progressing.  

## 2019-06-12 LAB — GLUCOSE, CAPILLARY
Glucose-Capillary: 134 mg/dL — ABNORMAL HIGH (ref 70–99)
Glucose-Capillary: 148 mg/dL — ABNORMAL HIGH (ref 70–99)
Glucose-Capillary: 150 mg/dL — ABNORMAL HIGH (ref 70–99)
Glucose-Capillary: 271 mg/dL — ABNORMAL HIGH (ref 70–99)

## 2019-06-12 MED ORDER — HYDROMORPHONE HCL 1 MG/ML IJ SOLN
0.2500 mg | Freq: Four times a day (QID) | INTRAMUSCULAR | Status: DC
  Administered 2019-06-12 – 2019-06-13 (×4): 0.25 mg via INTRAVENOUS
  Filled 2019-06-12 (×4): qty 1

## 2019-06-12 MED ORDER — PROMETHAZINE HCL 25 MG/ML IJ SOLN
6.2500 mg | Freq: Four times a day (QID) | INTRAMUSCULAR | Status: DC | PRN
  Administered 2019-06-12: 5.25 mg via INTRAVENOUS
  Administered 2019-06-12 – 2019-06-13 (×2): 6.25 mg via INTRAVENOUS
  Filled 2019-06-12 (×3): qty 1

## 2019-06-12 MED ORDER — ONDANSETRON HCL 4 MG/2ML IJ SOLN
4.0000 mg | Freq: Four times a day (QID) | INTRAMUSCULAR | Status: DC
  Administered 2019-06-12 – 2019-06-13 (×4): 4 mg via INTRAVENOUS
  Filled 2019-06-12 (×4): qty 2

## 2019-06-12 NOTE — Progress Notes (Signed)
  Palliative medicine progress note  Patient seen, chart reviewed.  Son Vicente Males is at the bedside.  Patient is minimally responsive even to her son.  Requiring 3-4 doses daily of pain medicine secondary to ongoing abdominal pain.  She is no longer taking anything by mouth.  All medications are IV given ongoing nausea.  Family has been hopeful for residential hospice specifically [facility] is their first choice but are open to other options such as hospice [facility] [place].  I did reach out to hospice of the [facility] liaison directly today to help facilitate admission to hospice [facility] [place] hopefully today  Patient Profile/HPI:77 y.o.femalewith past medical history of DM2, esophageal stricture, CKD 3, obesitywho was admitted on 5/31/2020with gall stone pancreatitis (lipase of 5000). She has been too unstable for surgery. Her recovery has been complicated by sepsis/sirs, GIB with elevated INR, recurrent pleural effusions (thoracentesis x 3), afib/aflutter, and esophageal dysphagia with the inability to place an N/G tube for nutrition and medication. The patient continues to have severe nausea and various types of pain.  Consult ordered for goals of care.  Despite multiple interventions and supportive care patient has continued to decline.  Family is hopeful for residential hospice for end-of-life care.   Beds have not been available at [facility].  Family is open to other residential hospices specifically hospice of the [facility] Patient is utilizing several PRN doses of Dilaudid for management of abdominal pain.  We will add hydromorphone on a scheduled basis  I have reached out to hospital liaison from [place] to help facilitate transfer to residential hospice, [facility].  They do have a bed.  I hope we can make this happen for Mrs. Aube today  Prognosis Less than 2 weeks in the setting of severe  pancreatitis (not resolving) with SIRS, hypoxic respiratory failure, patient is no longer able to eat or drink because of nausea and associated pain; comorbidities of diastolic heart failure chronic kidney disease stage III as well as recurrent pleural effusions requiring 3 thoracentesis  Disposition [facility]  Thank you Romona Curls, NP Total time: 25 minutes Greater than 50% of time was spent in counseling and coordination of care

## 2019-06-12 NOTE — Progress Notes (Signed)
PROGRESS NOTE    Melanie [name]  GUR:[id number] DOB: 12/19/1941 DOA: 05/22/2019 PCP: [name], MD   Brief Narrative:  78 year old WF PMHx dementia, diabetes type 2 uncontrolled with complication, DM neuropathy, HLD, essential HTN, A. fib on warfarin, dysphagia, cholelithiasis, colon polyps,  Admitted on5/31/2020, presented with complaint of epigastric abdominal pain nausea and vomiting, was found to have acute pancreatitis as well as sepsis of unknown origin. Awaiting residential hospice placement.   Assessment & Plan:   Principal Problem:   Acute pancreatitis Active Problems:   HTN (hypertension)   DM (diabetes mellitus) type II controlled, neurological manifestation (HCC)   Afib (HCC)   SOB (shortness of breath)   Abdominal discomfort   Acute respiratory failure with hypoxia (HCC)   Cholelithiasis   Pleural effusion on left   Acute on chronic diastolic heart failure (HCC)   Dysphasia   Bilateral pleural effusion   Palliative care encounter   Goals of care, counseling/discussion   Nausea without vomiting   Generalized abdominal pain   Palliative care by specialist   Acute biliary pancreatitis Unable to perform or consider lap chole at this time. Lipase wnl.  Per GI note 6/9 patient will require surgical referral/consultation to consider a cholecystectomy prior to discharge as result of previously felt pancreatitis due to likely biliary/gallstone disease that occurred upon her initial admission.  MRCP negative for CBD dilation or CBD stone In view of clinical deterioration, and after palliative meeting on 6/18, transition to residential hopsice as per family and patient request.   Dysphagia,  Unclear etiology.  Possibly esophageal stricture.  IR unable to place [place].  GI unable to do EGD/ sedation, as they feel she would be intubated for sedation and would not be able to wean off or extubated.  GI has signed off.   Sepsis from CAP / aspiration  pneumonia Duo nebs as needed.   Hypokalemia  Replaced.     Hypophosphatemia  Replaced.   Chronic atrial fib with RVR:  Rate controlled with BB.    Bilateral pleural effusions,  Underwent right 6/8 and left thoracentesis on 6/9, 6/14, repeat CXR shows residual effusion. Pleural fluid cultures negative so far.     Left upper extremity swelling: Venous duplex revealed acute DVT and SVT on the left upper extremity.  Heparin was started but had to be discontinued as patient and family agree to be transitioned to hospice and comfort measures.   In view of the multiple medical problems, clinical deterioration, poor nutrition and poor functional status, palliative care consulted and plan to transition to residential  hospice in the next 24 hours.      DVT prophylaxis:  scd's Code Status: DNR Family Communication: none at bedside Disposition Plan:  Awaiting residential hospice.    Consultants:   Gastroenterology  Cardiology  Palliative care.    Procedures/Significant Events:  6/4 EEG: Abnormal EEG due to the presence ofgeneral background slowing andtriphasic waves. This isseen most commonly in encephalopathic states, most commonly, but not limited to, metabolic encephalopathies 6/5 echocardiogram:-The left ventricle has hyperdynamic systolic function, with an ejection fraction of >65%.-The cavity size was decreased.  -Left Atrium: moderate to severely enlarged . -Right Atrium: mildly dilated. pressure is estimated at 10 mmHg. 6/8 RIGHT thoracentesis.: 1.2 L aspirated 6/9 LEFT thoracentesis; 658ml 6/11 PCXR:-Improved airspace disease in the RIGHT lung. - Persistent LEFT effusion and basilar atelectasis. -Hiatal hernia 6/12 echocardiogram with bubble study: Abnormal bubble contrast study.  Interstitial shunt noted suggestive of patent foramen ovale -LVEF  5 - 60% 6/13 PCXR: Lucency left lower mediastinum corresponds to known hiatal hernia -Moderately large LEFT  pleural effusion has increased in size with worsening atelectasis or consolidation in the left lung -Mild patchy airspace opacity persists right lung  6/14 LEFT thoracentesis: 320 cc 6/16 transfuse 1 unit PRBC   Antimicrobials:  None   Subjective: Lethargic, minimal conversative.   Objective: Vitals:   06/11/19 1950 06/12/19 0002 06/12/19 0425 06/12/19 0728  BP: 127/74 (!) 129/58 137/75 137/87  Pulse: (!) 109 89 81   Resp: 20 18 (!) 24 15  Temp: 98.5 F (36.9 C) 99.2 F (37.3 C) 98.3 F (36.8 C) 98 F (36.7 C)  TempSrc: Oral Axillary Oral Oral  SpO2: 100% 100% 100%   Weight:      Height:        Intake/Output Summary (Last 24 hours) at 06/12/2019 0806 Last data filed at 06/12/2019 0600 Gross per 24 hour  Intake 1100.25 ml  Output 1050 ml  Net 50.25 ml   Filed Weights   06/08/19 0334 06/09/19 0310 06/10/19 0426  Weight: 99.8 kg 103.9 kg 112.5 kg    Examination:  General exam: Appears calm and comfortable , does not appear to be in distress.  Respiratory system: diminished at bases Cardiovascular system: S1 & S2 heard,  Irregular.  Gastrointestinal system: Abdomen is soft tender, distended.  Central nervous system:  Lethargic, not able to perform full neuro evaluation.  Extremities: left lower extremity swelling and tenderness> right lower extremity swelling.  Skin: see above. Psychiatry: cannot be assessed.    Data Reviewed: I have personally reviewed following labs and imaging studies  CBC: Recent Labs  Lab 06/06/19 0620 06/06/19 2337 06/07/19 1101 06/08/19 1642 06/09/19 1055  WBC 12.0* 12.4* 17.4*  --  26.6*  NEUTROABS  --   --   --   --  25.5*  HGB 8.2* 6.7* 9.7* 9.9* 12.2  HCT 26.6* 21.5* 31.1* 32.1* 39.4  MCV 97.8 96.8 97.5  --  96.6  PLT 223 175 199  --  191   Basic Metabolic Panel: Recent Labs  Lab 06/06/19 0904 06/06/19 2337 06/07/19 1101 06/08/19 0457 06/09/19 0009 06/10/19 1035  NA 153* 151* 151* 147* 137  --   K 4.1 3.7 3.9 4.1  3.0*  --   CL 121* 120* 121* 116* 103  --   CO2 20* 22 24 22 23   --   GLUCOSE 188* 260* 150* 149* 120*  --   BUN 52* 55* 52* 54* 22  --   CREATININE 1.59* 1.47* 1.24* 1.49* 0.81  --   CALCIUM 8.3* 8.4* 8.3* 8.5* 7.8*  --   MG 2.6* 2.5* 2.6* 2.6* 1.9 2.3  PHOS  --   --   --   --  1.3*  --    GFR: Estimated Creatinine Clearance: 71.4 mL/min (by C-G formula based on SCr of 0.81 mg/dL). Liver Function Tests: Recent Labs  Lab 06/06/19 0904 06/09/19 0009  AST 34 23  ALT 54* 19  ALKPHOS 76 46  BILITOT 0.8 0.6  PROT 5.1* 4.8*  ALBUMIN 2.9* 2.5*   Recent Labs  Lab 06/06/19 0904  LIPASE 30   No results for input(s): AMMONIA in the last 168 hours. Coagulation Profile: Recent Labs  Lab 06/05/19 0957 06/06/19 0620 06/06/19 2337 06/08/19 0457 06/09/19 0009  INR 1.6* 1.6* 1.6* 1.3* 1.1   Cardiac Enzymes: No results for input(s): CKTOTAL, CKMB, CKMBINDEX, TROPONINI in the last 168 hours. BNP (last 3 results) No  results for input(s): PROBNP in the last 8760 hours. HbA1C: No results for input(s): HGBA1C in the last 72 hours. CBG: Recent Labs  Lab 06/11/19 1604 06/11/19 1950 06/12/19 0001 06/12/19 0419 06/12/19 0732  GLUCAP 168* 161* 150* 148* 134*   Lipid Profile: No results for input(s): CHOL, HDL, LDLCALC, TRIG, CHOLHDL, LDLDIRECT in the last 72 hours. Thyroid Function Tests: No results for input(s): TSH, T4TOTAL, FREET4, T3FREE, THYROIDAB in the last 72 hours. Anemia Panel: No results for input(s): VITAMINB12, FOLATE, FERRITIN, TIBC, IRON, RETICCTPCT in the last 72 hours. Sepsis Labs: No results for input(s): PROCALCITON, LATICACIDVEN in the last 168 hours.  Recent Results (from the past 240 hour(s))  Respiratory Panel by PCR     Status: None   Collection Time: 06/03/19 10:07 AM   Specimen: Nasopharyngeal Swab; Respiratory  Result Value Ref Range Status   Adenovirus NOT DETECTED NOT DETECTED Final   Coronavirus 229E NOT DETECTED NOT DETECTED Final    Comment:  (NOTE) The Coronavirus on the Respiratory Panel, DOES NOT test for the novel  Coronavirus (2019 nCoV)    Coronavirus HKU1 NOT DETECTED NOT DETECTED Final   Coronavirus NL63 NOT DETECTED NOT DETECTED Final   Coronavirus OC43 NOT DETECTED NOT DETECTED Final   Metapneumovirus NOT DETECTED NOT DETECTED Final   Rhinovirus / Enterovirus NOT DETECTED NOT DETECTED Final   Influenza A NOT DETECTED NOT DETECTED Final   Influenza B NOT DETECTED NOT DETECTED Final   Parainfluenza Virus 1 NOT DETECTED NOT DETECTED Final   Parainfluenza Virus 2 NOT DETECTED NOT DETECTED Final   Parainfluenza Virus 3 NOT DETECTED NOT DETECTED Final   Parainfluenza Virus 4 NOT DETECTED NOT DETECTED Final   Respiratory Syncytial Virus NOT DETECTED NOT DETECTED Final   Bordetella pertussis NOT DETECTED NOT DETECTED Final   Chlamydophila pneumoniae NOT DETECTED NOT DETECTED Final   Mycoplasma pneumoniae NOT DETECTED NOT DETECTED Final    Comment: Performed at [facility], [address] [place], [place] [address]  SARS Coronavirus 2     Status: None   Collection Time: 06/05/19  9:05 AM  Result Value Ref Range Status   SARS Coronavirus 2 NOT DETECTED NOT DETECTED Final    Comment: (NOTE) SARS-CoV-2 target nucleic acids are NOT DETECTED. The SARS-CoV-2 RNA is generally detectable in upper and lower respiratory specimens during the acute phase of infection.  Negative  results do not preclude SARS-CoV-2 infection, do not rule out co-infections with other pathogens, and should not be used as the sole basis for treatment or other patient management decisions.  Negative results must be combined with clinical observations, patient history, and epidemiological information. The expected result is Not Detected. Fact Sheet for Patients: http://www.biofiredefense.com/wp-content/uploads/2020/03/BIOFIRE-COVID -19-patients.pdf Fact Sheet for Healthcare Providers:  http://www.biofiredefense.com/wp-content/uploads/2020/03/BIOFIRE-COVID -19-hcp.pdf This test is not yet approved or cleared by the [place] and  has been authorized for detection and/or diagnosis of SARS-CoV-2 by FDA under an Emergency Use Authorization (EUA).  This EUA will remain in effec t (meaning this test can be used) for the duration of  the COVID-19 declaration under Section 564(b)(1) of the Act, 21 U.S.C. section 360bbb-3(b)(1), unless the authorization is terminated or revoked sooner. Performed at [facility], [place] [address] [place], [place] [address]   Gram stain     Status: None   Collection Time: 06/05/19 12:49 PM   Specimen: Pleural, Left; Pleural Fluid  Result Value Ref Range Status   Specimen Description PLEURAL LEFT  Final   Special  Requests NONE  Final   Gram Stain   Final    FEW WBC PRESENT, PREDOMINANTLY MONONUCLEAR NO ORGANISMS SEEN Performed at [facility], [place] [address] [place], [place] [address]    Report Status 06/06/2019 FINAL  Final  Fungus Culture With Stain     Status: None (Preliminary result)   Collection Time: 06/05/19 12:49 PM   Specimen: Pleural, Left; Pleural Fluid  Result Value Ref Range Status   Fungus Stain Final report  Final    Comment: (NOTE) Performed At: [facility] [place], [place] [id number] Rush Farmer MD HR:[id number]    Fungus (Mycology) Culture PENDING  Incomplete   Fungal Source PLEURAL  Final    Comment: LEFT Performed at [facility], [place] [address] [place], [place] [address]   Fungus Culture Result     Status: None   Collection Time: 06/05/19 12:49 PM  Result Value Ref Range Status   Result 1 Comment  Final    Comment: (NOTE) KOH/Calcofluor preparation:  no fungus observed. Performed At: [facility] [place], [place] [id number] Rush Farmer MD BB:[id number]          Radiology Studies: No results found.      Scheduled  Meds: . feeding supplement (ENSURE ENLIVE)  237 mL Oral TID BM  . feeding supplement (PRO-STAT SUGAR FREE 64)  30 mL Per Tube BID BM  . furosemide  20 mg Oral Daily  . insulin aspart  0-20 Units Subcutaneous Q4H  . insulin glargine  10 Units Subcutaneous QHS  . ipratropium-albuterol  3 mL Nebulization TID  . metoprolol tartrate  25 mg Oral Q6H  . pantoprazole  40 mg Oral BID  . potassium & sodium phosphates  1 packet Oral TID WC & HS  . sodium chloride flush  10-40 mL Intracatheter Q12H   Continuous Infusions: . sodium chloride Stopped (06/07/19 2041)  . dextrose 50 mL/hr at 06/12/19 0500     LOS: 20 days    Time spent: 25 minutes    Hosie Poisson, MD [facility] Pager [phone]  If 7PM-7AM, please contact night-coverage [email or web address] Password [facility] 06/12/2019, 8:06 AM

## 2019-06-12 NOTE — Progress Notes (Signed)
No beds at [facility]. Daughter [name]) wants her to go to [facility].. Faxed needed documents to [place] at [place]. CSW notified Daughter that she should receive a call from [place].

## 2019-06-13 MED ORDER — IPRATROPIUM-ALBUTEROL 0.5-2.5 (3) MG/3ML IN SOLN: 3.0000 mL | Freq: Three times a day (TID) | RESPIRATORY_TRACT | Status: AC

## 2019-06-13 MED ORDER — LORAZEPAM 2 MG/ML IJ SOLN: 0.5000 mg | Freq: Four times a day (QID) | INTRAMUSCULAR | 0 refills | Status: AC | PRN

## 2019-06-13 MED ORDER — ENSURE ENLIVE PO LIQD: 237.0000 mL | Freq: Three times a day (TID) | ORAL | 12 refills | Status: AC

## 2019-06-13 MED ORDER — PRO-STAT SUGAR FREE PO LIQD: 30.0000 mL | Freq: Two times a day (BID) | ORAL | 0 refills | Status: AC

## 2019-06-13 NOTE — TOC Initial Note (Addendum)
Transition of Care [facility]) - Initial/Assessment Note    Patient Details  Name: Melanie [name] MRN: [id number] Date of Birth: Jun 21, 1941  [facility] CM/SW Contact:    Vinie Sill, [place] Phone Number: 06/13/2019, 12:11 PM  Clinical Narrative:                [facility] has confirmed they have spoken with the family and the patient is ready to discharge to their facility.   Patient will DC to:   [facility] Date:   06/13/2019 Family Notified:  [name], daughter Transport By:  Corey Harold @ 2:30pm  RN, patient, and facility notified of DC. Discharge Summary sent to facility. RN given number for report [phone]. Ambulance transport requested for patient.   Clinical Social Worker signing off.  Thurmond Butts, MSW, LCSWA Clinical Social Worker [phone]     Barriers to Discharge: Barriers Resolved   Patient Goals and CMS Choice   CMS Medicare.gov Compare Post Acute Care list provided to:: Patient Represenative (must comment) Choice offered to / list presented to : Adult Children  Expected Discharge Plan and Services           Expected Discharge Date: 06/13/19                                    Prior Living Arrangements/Services                       Activities of Daily Living Home Assistive Devices/Equipment: Dentures (specify type) ADL Screening (condition at time of admission) Patient's cognitive ability adequate to safely complete daily activities?: No Is the patient deaf or have difficulty hearing?: Yes Does the patient have difficulty seeing, even when wearing glasses/contacts?: No Does the patient have difficulty concentrating, remembering, or making decisions?: Yes Patient able to express need for assistance with ADLs?: Yes Does the patient have difficulty dressing or bathing?: No Independently performs ADLs?: Yes (appropriate for developmental age) Does the patient have difficulty walking or  climbing stairs?: Yes Weakness of Legs: Both Weakness of Arms/Hands: None  Permission Sought/Granted                  Emotional Assessment              Admission diagnosis:  Dehydration [E86.0] Pain [R52] Idiopathic acute pancreatitis without infection or necrosis [K85.00] Patient Active Problem List   Diagnosis Date Noted  . Nausea without vomiting   . Generalized abdominal pain   . Palliative care by specialist   . Terminal care   . Bilateral pleural effusion   . Palliative care encounter   . Pleural effusion on left   . Acute on chronic diastolic heart failure ([place])   . Dysphasia   . Abdominal discomfort   . Acute respiratory failure with hypoxia ([place])   . Cholelithiasis   . SOB (shortness of breath)   . Acute pancreatitis 05/23/2019  . Osteopenia 01/14/2018  . Encounter for therapeutic drug monitoring 01/01/2018  . Afib ([place]) 01/01/2018  . Recurrent cystitis 10/26/2017  . Family history of colon cancer 08/28/2015  . Dysphagia 08/21/2015  . GERD (gastroesophageal reflux disease) 06/14/2015  . Onychomycosis 09/14/2013  . Pain in joint, ankle and foot 09/14/2013  . Annual physical exam 07/06/2012  . Colon polyps 07/06/2012  . HTN (hypertension) 01/14/2012  . DM (diabetes mellitus) type II  controlled, neurological manifestation ([place]) 01/14/2012  . Neuropathy of lower extremity 01/14/2012  . Neck pain 01/14/2012  . Hyperlipidemia 01/14/2012  . History of colonoscopy with polypectomy 11/06/2011   PCP:  [name], MD Pharmacy:   [id number] - [place], [place] [address] Phone: [phone] Fax: [phone]     Social Determinants of Health (SDOH) Interventions    Readmission Risk Interventions No flowsheet data found.

## 2019-06-13 NOTE — Progress Notes (Signed)
Palliative Medicine RN Note: Spoke with [place]. Mrs Scroggs has been approved for transfer to [facility] and is only pending a call from family to set up a meeting to complete paperwork.  Marjie Skiff Adline Kirshenbaum, RN, BSN, [facility] Palliative Medicine Team 06/13/2019 10:24 AM Office [phone]

## 2019-06-13 NOTE — Discharge Summary (Signed)
Physician Discharge Summary  Melanie [name] PIR:[id number] DOB: Oct 01, 1941 DOA: 05/22/2019  PCP: [name], MD  Admit date: 05/22/2019 Discharge date: 06/13/2019  Admitted From: HOME Disposition: Residential Hospice  Recommendations for Outpatient Follow-up:  Follow up with hospice MD as recommended.    Discharge [place].  CODE STATUS:DNR Diet recommendation: comfort feeds.   Brief/Interim Summary: 78 year old WF PMHxdementia, diabetes type 2 uncontrolled with complication, DM neuropathy, HLD, essential HTN, A. fib on warfarin, dysphagia, cholelithiasis, colon polyps,  Admitted on5/31/2020, presented with complaint of epigastric abdominal pain nausea and vomiting, was found to have acute pancreatitis as well as sepsis of unknown origin. Awaiting residential hospice placement.   Discharge Diagnoses:  Principal Problem:   Acute pancreatitis Active Problems:   HTN (hypertension)   DM (diabetes mellitus) type II controlled, neurological manifestation (HCC)   Afib (HCC)   SOB (shortness of breath)   Abdominal discomfort   Acute respiratory failure with hypoxia (HCC)   Cholelithiasis   Pleural effusion on left   Acute on chronic diastolic heart failure (HCC)   Dysphasia   Bilateral pleural effusion   Palliative care encounter   Terminal care   Nausea without vomiting   Generalized abdominal pain   Palliative care by specialist  Acute biliary pancreatitis Unable to perform or consider lap chole at this time. Lipase wnl.  Per GI note 6/9patient will require surgical referral/consultationto consider a cholecystectomy prior to dischargeas result of previously felt pancreatitis due to likely biliary/gallstone disease that occurred upon her initial admission. MRCP negative for CBD dilation or CBD stone In view of clinical deterioration, and after palliative meeting on 6/18, transition to residential hopsice as per family and patient request.    Dysphagia,  Unclear etiology.  Possibly esophageal stricture.  IR unable to place [place].  GI unable to do EGD/ sedation, as they feel she would be intubated for sedation and would not be able to wean off or extubated.  GI has signed off.   Sepsis from CAP / aspiration pneumonia Duo nebs as needed.   Hypokalemia  Replaced.     Hypophosphatemia  Replaced.   Chronic atrial fib with RVR:  Rate controlled with BB.    Bilateral pleural effusions,  Underwent right 6/8 and left thoracentesis on 6/9, 6/14, repeat CXR shows residual effusion. Pleural fluid cultures negative so far.     Left upper extremity swelling: Venous duplex revealed acute DVT and SVT on the left upper extremity.  Heparin was started but had to be discontinued as patient and family agree to be transitioned to hospice and comfort measures.   In view of the multiple medical problems, clinical deterioration, poor nutrition and poor functional status, palliative care consulted and plan to transition to residential  hospice in the next 24 hours.    Discharge Instructions  Discharge Instructions    Discharge instructions   Complete by: As directed    PLEASE follow up with Hospice MD  As needed.     Allergies as of 06/13/2019      Reactions   Niacin And Related Rash   Keflex [cephalexin] Other (See Comments)   Fatigue, sore throat, body aches   Sausage [pickled Meat] Other (See Comments)   Drowsiness and dizziness   Tape Rash   Bandaids       Medication List    STOP taking these medications   CALTRATE 600+D3 SOFT PO   CRANBERRY PO   diltiazem 90 MG tablet Commonly known as: CARDIZEM   fish  oil-omega-3 fatty acids 1000 MG capsule   Imodium A-D 2 MG capsule Generic drug: loperamide   lisinopril-hydrochlorothiazide 20-12.5 MG tablet Commonly known as: ZESTORETIC   metFORMIN 500 MG tablet Commonly known as: GLUCOPHAGE   pravastatin 40 MG tablet Commonly known as:  PRAVACHOL   PROBIOTIC PO   sulfamethoxazole-trimethoprim 800-160 MG tablet Commonly known as: Bactrim DS   Vitamin B-12 1000 MCG Subl   vitamin C 1000 MG tablet   vitamin E 100 UNIT capsule   warfarin 5 MG tablet Commonly known as: COUMADIN     TAKE these medications   feeding supplement (ENSURE ENLIVE) Liqd Take 237 mLs by mouth 3 (three) times daily between meals.   feeding supplement (PRO-STAT SUGAR FREE 64) Liqd Place 30 mLs into feeding tube 2 (two) times daily between meals.   fluticasone 50 MCG/ACT nasal spray Commonly known as: FLONASE Place 2 sprays into both nostrils daily as needed for allergies or rhinitis.   furosemide 20 MG tablet Commonly known as: LASIX TAKE 1 TABLET BY MOUTH EVERY DAY   hydrocortisone 2.5 % cream APPLY TO THE FACE TWICE A DAY AS NEEDED 1 WEEK ON THEN 1 WEEK OFF   ipratropium-albuterol 0.5-2.5 (3) MG/3ML Soln Commonly known as: DUONEB Take 3 mLs by nebulization 3 (three) times daily.   LORazepam 2 MG/ML injection Commonly known as: ATIVAN Inject 0.25 mLs (0.5 mg total) into the vein every 6 (six) hours as needed for anxiety.   metoprolol tartrate 25 MG tablet Commonly known as: LOPRESSOR Take 1.5 tablets (37.5 mg total) by mouth 2 (two) times daily. What changed: how much to take   omeprazole 20 MG capsule Commonly known as: PRILOSEC TAKE 1 CAPSULE BY MOUTH EVERY DAY What changed: how much to take       Allergies  Allergen Reactions  . Niacin And Related Rash  . Keflex [Cephalexin] Other (See Comments)    Fatigue, sore throat, body aches  . Sausage [Pickled Meat] Other (See Comments)    Drowsiness and dizziness  . Tape Rash    Bandaids     Consultations:  palliaitive care   Procedures/Studies: Ct Abdomen Pelvis Wo Contrast  Result Date: 05/29/2019 CLINICAL DATA:  78 year old female currently mid with acute pancreatitis and sepsis of unknown origin. Significant breathing issues overnight as well as increasing  epigastric abdominal pain, fever and leukocytosis. EXAM: CT CHEST, ABDOMEN AND PELVIS WITHOUT CONTRAST TECHNIQUE: Multidetector CT imaging of the chest, abdomen and pelvis was performed following the standard protocol without IV contrast. COMPARISON:  Most recent prior CT scan of the abdomen and pelvis 05/23/2019 FINDINGS: CT CHEST FINDINGS Cardiovascular: Limited evaluation in the absence of intravenous contrast. Atherosclerotic calcifications present along the thoracic aorta. Persistent biatrial enlargement. Calcification of the aortic valve and mitral valve annulus. No pericardial effusion. Mediastinum/Nodes: Unremarkable esophagus. No suspicious mediastinal mass or adenopathy. There is a moderate hiatal hernia. Fluid is present in the mid and distal esophagus. Lungs/Pleura: Moderate to large layering right pleural effusion and moderate layering left pleural effusion with associated bibasilar atelectasis. No evidence of pulmonary edema. The aerated portions of the lungs remain relatively clear. A few small scattered foci of ground-glass attenuation opacity likely reflect areas of trace atelectasis. There is mild atelectasis in the inferior aspect of the right middle lobe. Musculoskeletal: No acute fracture or aggressive appearing lytic or blastic osseous lesion. CT ABDOMEN PELVIS FINDINGS Hepatobiliary: Normal hepatic contour and morphology. No discrete hepatic lesion. No intra or extrahepatic biliary ductal dilatation. High attenuation material layers  within the gallbladder lumen consistent with small stones. No gallbladder distention or pericholecystic fluid. Pancreas: Similar appearance of the pancreas which is edematous and poorly defined consistent with pancreatitis. Perhaps slightly increased peripancreatic fluid and edema adjacent to the pancreatic tail compared to recent prior imaging. The remainder of the edema along the body and head appears similar. No new pseudocyst or gas to suggest pancreatic  necrosis. Secondary inflammatory thickening of the first and second portions of the duodenum again noted. Incidental note is made of a duodenal diverticulum, unchanged. Spleen: Normal in size without focal abnormality. Adrenals/Urinary Tract: The adrenal glands are unremarkable. No evidence of hydronephrosis or nephrolithiasis. Stable simple right renal cyst. The ureters are unremarkable. A Foley catheter is present within the collapsed bladder. Stomach/Bowel: A rectal tube is present. Colonic diverticular disease without CT evidence of active inflammation. No evidence of bowel obstruction. Vascular/Lymphatic: Atherosclerotic calcifications throughout the abdominal aorta. No aneurysm. Limited evaluation in the absence of intravenous contrast. No suspicious mediastinal lymphadenopathy. Reproductive: Uterus and bilateral adnexa are unremarkable. Other: No intraperitoneal ascites or intra-abdominal abscess collection identified. Musculoskeletal: No acute fracture or aggressive appearing lytic or blastic osseous lesion. Mild dependent body wall edema. IMPRESSION: CT CHEST 1. Moderate to large bilateral pleural effusions slightly larger on the right than the left with associated bilateral lower lobe atelectasis. This may be contributing to the patient's underlying shortness of breath. 2. Otherwise, the lungs are fairly clear. No evidence of significant pneumonia or pneumonitis. 3. Moderate hiatal hernia with fluid filling the mid and distal esophagus. This is concerning for reflux versus poor esophageal motility and could represent a potential aspiration risk. 4. Persistent cardiomegaly with biatrial enlargement. 5.  Aortic Atherosclerosis (ICD10-170.0) CT ABD/PELVIS 1. Persistent findings of acute/subacute pancreatitis. Perhaps slightly more inflammatory stranding and fluid adjacent to the pancreatic tail but no new pseudocyst or findings to suggest pancreatic necrosis. 2. Persistent but perhaps slightly improved  secondary inflammatory thickening of the proximal duodenum. 3. No evidence of abdominal ascites, abscess or other acute abnormality. 4. Cholelithiasis without imaging findings to suggest acute cholecystitis. 5. Rectal tube and Foley catheter in place. 6. Additional ancillary findings as above without significant interval change. Signed, Criselda Peaches, MD, [facility] Electronically Signed   By: Jacqulynn Cadet M.D.   On: 05/29/2019 09:03   Dg Chest 1 View  Result Date: 06/05/2019 CLINICAL DATA:  Left-sided thoracentesis.  Follow-up. EXAM: CHEST  1 VIEW COMPARISON:  June 04, 2019 FINDINGS: The left-sided pleural effusion is smaller. A small effusion with underlying atelectasis remains. No pneumothorax after thoracentesis. No other changes. IMPRESSION: The left-sided pleural effusion is smaller in the interval. No pneumothorax after thoracentesis. Electronically Signed   By: Dorise Bullion III M.D   On: 06/05/2019 12:58   Dg Chest 1 View  Result Date: 05/31/2019 CLINICAL DATA:  Status post left thoracentesis. EXAM: CHEST  1 VIEW COMPARISON:  Radiograph of same day. FINDINGS: Mild cardiomegaly is noted. Left pleural effusion is nearly resolved status post thoracentesis. No definite pneumothorax is noted. Stable right lung opacities are noted. Bony thorax is unremarkable. IMPRESSION: Left pleural effusion is nearly resolved status post thoracentesis. No pneumothorax is noted. Electronically Signed   By: Marijo Conception M.D.   On: 05/31/2019 13:55   Dg Chest 1 View  Result Date: 05/30/2019 CLINICAL DATA:  Status post right thoracentesis EXAM: CHEST  1 VIEW COMPARISON:  05/28/2019 FINDINGS: Cardiac shadow is stable. Aortic calcifications are again seen. Mild right  basilar atelectasis is noted although resolution of right-sided pleural effusion is seen. No pneumothorax is noted. Persistent left-sided effusion is seen. IMPRESSION: No  pneumothorax following right thoracentesis. Stable left pleural effusion is noted. Electronically Signed   By: Inez Catalina M.D.   On: 05/30/2019 09:43   Ct Head Wo Contrast  Result Date: 05/25/2019 CLINICAL DATA:  Altered mental status. EXAM: CT HEAD WITHOUT CONTRAST TECHNIQUE: Contiguous axial images were obtained from the base of the skull through the vertex without intravenous contrast. COMPARISON:  12/01/2012. FINDINGS: Brain: No evidence of acute infarction, hemorrhage, hydrocephalus, extra-axial collection or mass lesion/mass effect. Generalized atrophy. Chronic microvascular ischemic change. Vascular: Calcification of the cavernous internal carotid arteries consistent with cerebrovascular atherosclerotic disease. No signs of intracranial large vessel occlusion. Skull: Calvarium intact. Sinuses/Orbits: No acute finding.  Chronic sphenoid sinus disease. Other: None IMPRESSION: Atrophy and small vessel disease. No acute intracranial findings. Similar appearance to priors. Electronically Signed   By: Staci Righter M.D.   On: 05/25/2019 14:12   Ct Chest Wo Contrast  Result Date: 05/29/2019 CLINICAL DATA:  78 year old female currently mid with acute pancreatitis and sepsis of unknown origin. Significant breathing issues overnight as well as increasing epigastric abdominal pain, fever and leukocytosis. EXAM: CT CHEST, ABDOMEN AND PELVIS WITHOUT CONTRAST TECHNIQUE: Multidetector CT imaging of the chest, abdomen and pelvis was performed following the standard protocol without IV contrast. COMPARISON:  Most recent prior CT scan of the abdomen and pelvis 05/23/2019 FINDINGS: CT CHEST FINDINGS Cardiovascular: Limited evaluation in the absence of intravenous contrast. Atherosclerotic calcifications present along the thoracic aorta. Persistent biatrial enlargement. Calcification of the aortic valve and mitral valve annulus. No pericardial effusion. Mediastinum/Nodes: Unremarkable esophagus. No suspicious mediastinal  mass or adenopathy. There is a moderate hiatal hernia. Fluid is present in the mid and distal esophagus. Lungs/Pleura: Moderate to large layering right pleural effusion and moderate layering left pleural effusion with associated bibasilar atelectasis. No evidence of pulmonary edema. The aerated portions of the lungs remain relatively clear. A few small scattered foci of ground-glass attenuation opacity likely reflect areas of trace atelectasis. There is mild atelectasis in the inferior aspect of the right middle lobe. Musculoskeletal: No acute fracture or aggressive appearing lytic or blastic osseous lesion. CT ABDOMEN PELVIS FINDINGS Hepatobiliary: Normal hepatic contour and morphology. No discrete hepatic lesion. No intra or extrahepatic biliary ductal dilatation. High attenuation material layers within the gallbladder lumen consistent with small stones. No gallbladder distention or pericholecystic fluid. Pancreas: Similar appearance of the pancreas which is edematous and poorly defined consistent with pancreatitis. Perhaps slightly increased peripancreatic fluid and edema adjacent to the pancreatic tail compared to recent prior imaging. The remainder of the edema along the body and head appears similar. No new pseudocyst or gas to suggest pancreatic necrosis. Secondary inflammatory thickening of the first and second portions of the duodenum again noted. Incidental note is made of a duodenal diverticulum, unchanged. Spleen: Normal in size without focal abnormality. Adrenals/Urinary Tract: The adrenal glands are unremarkable. No evidence of hydronephrosis or nephrolithiasis. Stable simple right renal cyst. The ureters are unremarkable. A Foley catheter is present within the collapsed bladder. Stomach/Bowel: A rectal tube is present. Colonic diverticular disease without CT evidence of active inflammation. No evidence of bowel obstruction. Vascular/Lymphatic: Atherosclerotic calcifications throughout the abdominal  aorta. No aneurysm. Limited evaluation in the absence of intravenous contrast. No suspicious mediastinal lymphadenopathy. Reproductive: Uterus and bilateral adnexa are unremarkable. Other: No intraperitoneal ascites or intra-abdominal abscess collection identified. Musculoskeletal: No acute fracture or  aggressive appearing lytic or blastic osseous lesion. Mild dependent body wall edema. IMPRESSION: CT CHEST 1. Moderate to large bilateral pleural effusions slightly larger on the right than the left with associated bilateral lower lobe atelectasis. This may be contributing to the patient's underlying shortness of breath. 2. Otherwise, the lungs are fairly clear. No evidence of significant pneumonia or pneumonitis. 3. Moderate hiatal hernia with fluid filling the mid and distal esophagus. This is concerning for reflux versus poor esophageal motility and could represent a potential aspiration risk. 4. Persistent cardiomegaly with biatrial enlargement. 5.  Aortic Atherosclerosis (ICD10-170.0) CT ABD/PELVIS 1. Persistent findings of acute/subacute pancreatitis. Perhaps slightly more inflammatory stranding and fluid adjacent to the pancreatic tail but no new pseudocyst or findings to suggest pancreatic necrosis. 2. Persistent but perhaps slightly improved secondary inflammatory thickening of the proximal duodenum. 3. No evidence of abdominal ascites, abscess or other acute abnormality. 4. Cholelithiasis without imaging findings to suggest acute cholecystitis. 5. Rectal tube and Foley catheter in place. 6. Additional ancillary findings as above without significant interval change. Signed, Criselda Peaches, MD, [place] [facility] Electronically Signed   By: Jacqulynn Cadet M.D.   On: 05/29/2019 09:03   Ct Abdomen Pelvis W Contrast  Result Date: 05/23/2019 CLINICAL DATA:  78 year old female with abdominal pain, nausea vomiting. History of GERD and diabetes and  hiatal hernia. EXAM: CT ABDOMEN AND PELVIS WITH CONTRAST TECHNIQUE: Multidetector CT imaging of the abdomen and pelvis was performed using the standard protocol following bolus administration of intravenous contrast. CONTRAST:  54mL OMNIPAQUE IOHEXOL 300 MG/ML  SOLN COMPARISON:  None. FINDINGS: Lower chest: Minimal bibasilar dependent atelectatic changes. There is coronary vascular calcification involving the left circumflex artery. No intra-abdominal free air. Small perihepatic ascites. Hepatobiliary: The liver is unremarkable. There is mild periportal edema. The gallbladder is distended. Small amount of layering stone noted within the gallbladder. No calcified stone identified in the central CBD. Pancreas: There is inflammatory changes of the pancreas with peripancreatic edema most consistent with acute pancreatitis. No abscess or pseudocyst. Spleen: Normal in size without focal abnormality. Adrenals/Urinary Tract: The adrenal glands are unremarkable. There is a 3 cm right renal upper pole cyst as well as additional subcentimeter renal hypodensities which are too small to characterize. There is no hydronephrosis on either side. There is symmetric enhancement and excretion of contrast by both kidneys. The visualized ureters and urinary bladder appear unremarkable. Stomach/Bowel: There is a large hiatal hernia. There is a 2.7 cm diverticulum arising from the third portion of the duodenum. There is colonic diverticulosis without active inflammatory changes. There is no bowel obstruction or active inflammation. The appendix is normal. Vascular/Lymphatic: Moderate aortoiliac atherosclerotic disease. The IVC is unremarkable. No portal venous gas. There is no adenopathy. Reproductive: The uterus is grossly unremarkable. No pelvic mass. Other: None Musculoskeletal: Degenerative changes of the spine. No acute osseous pathology. IMPRESSION: 1. Acute pancreatitis. No abscess or pseudocyst. 2. Colonic diverticulosis. No bowel  obstruction or active inflammation. Normal appendix. 3. Cholelithiasis. 4. Large hiatal hernia. Electronically Signed   By: Anner Crete M.D.   On: 05/23/2019 00:38   Mr 3d Recon At Scanner  Result Date: 05/24/2019 CLINICAL DATA:  Pt unable to follow breathing instructions, best possible images. Cholelithiasis Pancreatitis, acute, first episode, etiol unknown, 2-3 days out 89ml gadavist Cholelithiasis Pancreatitis, acute, first episode, etiol unknown, 2-3 days out EXAM: MRI ABDOMEN WITHOUT AND WITH CONTRAST (INCLUDING MRCP) TECHNIQUE: Multiplanar multisequence MR imaging of the abdomen was performed  both before and after the administration of intravenous contrast. Heavily T2-weighted images of the biliary and pancreatic ducts were obtained, and three-dimensional MRCP images were rendered by post processing. CONTRAST:  9 mL Gadavist COMPARISON:  CT abdomen 05/23/2019 FINDINGS: Lower chest:  Small bilateral pleural effusions. Hepatobiliary: No intrahepatic or extrahepatic biliary duct dilatation. No filling defect within the common bile duct. Common bile duct is upper limits of normal in diameter at 6 mm. Small amount of layering sludge or gallstones within the gallbladder present. No pericholecystic fluid or gallbladder distension. Pancreas: Mild edema within the pancreas. The pancreatic contours are well maintained. There is peripancreatic fluid and fluid extending along the pericolic gutters increased from comparison CT. No organized fluid collections Postcontrast imaging demonstrates fairly uniform enhancement of the pancreatic parenchyma without evidence of necrosis. There is no pancreatic ductal dilatation. No vascular complication associated pancreatitis. Spleen: Normal spleen. Adrenals/urinary tract: Adrenal glands and kidneys are normal. Stomach/Bowel: Moderate hiatal hernia. Increased fluid from the pancreatitis extends into the hernia sac. Vascular/Lymphatic: Abdominal aortic normal caliber. No  retroperitoneal periportal lymphadenopathy. Musculoskeletal: No aggressive osseous lesion IMPRESSION: 1. Acute pancreatitis with increase in peripancreatic fluid extending along the pericolic gutters. No organized collections. No evidence of pancreatic necrosis. 2. No etiology of pancreatitis. Several gallstones within the gallbladder but no cholelithiasis. No biliary obstruction. Imaging findings are not typical of autoimmune pancreatitis. 3. Bilateral pleural effusions, basilar atelectasis and moderate hiatal hernia. Electronically Signed   By: Suzy Bouchard M.D.   On: 05/24/2019 07:46   Dg Chest Port 1 View  Result Date: 06/08/2019 CLINICAL DATA:  Sepsis EXAM: PORTABLE CHEST 1 VIEW COMPARISON:  06/05/2019, 06/04/2019, 06/02/2019 FINDINGS: Small moderate left greater than right pleural effusions. Worsening airspace disease at the right base. Persistent airspace disease at the left lung base. Stable enlarged cardiomediastinal silhouette. Asymmetric opacity in the right hilus. No pneumothorax. IMPRESSION: 1. Small moderate bilateral left greater than right pleural effusions, increased on the right side 2. Worsening airspace disease at the right lung base with continued consolidation at the left base, atelectasis versus pneumonia 3. Cardiomegaly. Asymmetric right hilar opacity, could be due to rotation and prominent pulmonary artery, however recommend dedicated two-view chest when clinically feasible. Electronically Signed   By: Donavan Foil M.D.   On: 06/08/2019 19:29   Dg Chest Port 1 View  Result Date: 06/04/2019 CLINICAL DATA:  Respiratory failure. EXAM: PORTABLE CHEST 1 VIEW COMPARISON:  06/02/2019 FINDINGS: The left heart border is obscured. Aortic atherosclerosis is noted. Lucency in the left lower mediastinum corresponds to a known hiatal hernia. A moderately large left pleural effusion has increased in size with worsening atelectasis or consolidation in the left lung. Mild patchy airspace opacity  persists in the right lung, most notably in the upper lobe and is stable to mildly increased. No pneumothorax is identified. IMPRESSION: Enlarging left pleural effusion with worsening left lung atelectasis or consolidation. Electronically Signed   By: Logan Bores M.D.   On: 06/04/2019 09:37   Dg Chest Port 1 View  Result Date: 06/02/2019 CLINICAL DATA:  Pleural effusion ,sob EXAM: PORTABLE CHEST 1 VIEW COMPARISON:  Chest radiograph 06/01/2019 FINDINGS: Similar large cardiac silhouette. LEFT effusion and probable basilar atelectasis unchanged. Hiatal hernia noted behind the heart. There is improvement in the patchy airspace disease seen in the RIGHT lung. IMPRESSION: 1. Improved airspace disease in the RIGHT lung. 2. Persistent LEFT effusion and basilar atelectasis. 3. Hiatal hernia Electronically Signed   By: Suzy Bouchard M.D.   On: 06/02/2019  08:46   Dg Chest Port 1 View  Result Date: 06/01/2019 CLINICAL DATA:  Shortness of breath EXAM: PORTABLE CHEST 1 VIEW COMPARISON:  05/31/2019 FINDINGS: Cardiac shadow is stable. Aortic calcifications are again seen. Lungs are well aerated bilaterally. Small pleural effusions are again noted. Patchy infiltrate is noted within the right lung. IMPRESSION: Stable appearance when compare with previous day. Electronically Signed   By: Inez Catalina M.D.   On: 06/01/2019 09:14   Dg Chest Port 1 View  Result Date: 05/31/2019 CLINICAL DATA:  Oxygen desaturation, diabetes mellitus, hypertension EXAM: PORTABLE CHEST 1 VIEW COMPARISON:  Portable exam 1707 hours compared to 05/31/2019 at 1335 hours FINDINGS: Upper normal heart size. Mediastinal contours and pulmonary vascularity normal. Persistent atelectasis and minimal pleural effusion at LEFT base. Minimal patchy infiltrate at RIGHT base. Remaining lungs clear. No pneumothorax identified, though portions of apices medially are obscured by patient's chin. Bones demineralized. IMPRESSION: Persistent LEFT pleural effusion  and basilar atelectasis. Mild RIGHT basilar infiltrate. Electronically Signed   By: Lavonia Dana M.D.   On: 05/31/2019 17:28   Dg Chest Port 1 View  Result Date: 05/31/2019 CLINICAL DATA:  Shortness of breath EXAM: PORTABLE CHEST 1 VIEW COMPARISON:  05/30/2019 FINDINGS: Worsening aeration on the left with near complete opacification of the left hemithorax, likely a combination of effusion and atelectasis. Patchy airspace disease noted in the right perihilar region. No visible effusion on the right. No acute bony abnormality. IMPRESSION: Worsening aeration on the left, likely a combination of effusion and airspace disease/atelectasis with near complete opacification of the left hemithorax. Patchy right perihilar opacities. Electronically Signed   By: Rolm Baptise M.D.   On: 05/31/2019 08:48   Dg Chest Port 1 View  Result Date: 05/28/2019 CLINICAL DATA:  78 year old female with history of shortness of breath. EXAM: PORTABLE CHEST 1 VIEW COMPARISON:  Chest x-ray 05/27/2019. FINDINGS: Lung volumes are slightly low. Bibasilar opacities may reflect moderate bilateral pleural effusions with areas of atelectasis and/or consolidation throughout the lung bases bilaterally. No evidence of pulmonary edema. Mild cardiomegaly. Upper mediastinal contours are within normal limits. Aortic atherosclerosis. IMPRESSION: 1. Unchanged radiographic appearance the chest, as above. Electronically Signed   By: Vinnie Langton M.D.   On: 05/28/2019 21:50   Dg Chest Port 1 View  Result Date: 05/27/2019 CLINICAL DATA:  Shortness of breath and chest pressure today. EXAM: PORTABLE CHEST 1 VIEW COMPARISON:  Single-view of the chest 05/26/2019. FINDINGS: Bilateral pleural effusions and basilar atelectasis have somewhat increased. No pneumothorax. Cardiomegaly and atherosclerosis noted. IMPRESSION: Some increase in moderate to moderately large bilateral pleural effusions and basilar atelectasis. Cardiomegaly. Atherosclerosis. Electronically  Signed   By: Inge Rise M.D.   On: 05/27/2019 16:27   Dg Chest Port 1 View  Result Date: 05/26/2019 CLINICAL DATA:  Increasing shortness of breath EXAM: PORTABLE CHEST 1 VIEW COMPARISON:  05/25/2019 FINDINGS: The bilateral pleural effusions have increased in size from the prior study. The heart size remains enlarged. Again noted are findings of pulmonary edema. There is no pneumothorax. Bibasilar airspace opacities are noted and are favored to represent atelectasis. IMPRESSION: Worsening volume overload as evidence by increasing bilateral pleural effusions. Electronically Signed   By: Constance Holster M.D.   On: 05/26/2019 22:52   Dg Chest Port 1 View  Result Date: 05/25/2019 CLINICAL DATA:  Shortness of breath EXAM: PORTABLE CHEST 1 VIEW COMPARISON:  05/22/2019 FINDINGS: The heart size is enlarged. There are new small bilateral pleural effusions which have increased in size from  the prior study. There is no pneumothorax. Bibasilar airspace opacities are noted. There is no acute osseous abnormality. There is generalized volume overload. IMPRESSION: Developing small bilateral pleural effusions with worsening volume overload. Electronically Signed   By: Constance Holster M.D.   On: 05/25/2019 21:41   Dg Chest Port 1 View  Result Date: 05/22/2019 CLINICAL DATA:  Abdominal pain, vomiting EXAM: PORTABLE CHEST 1 VIEW COMPARISON:  12/29/2016 FINDINGS: Cardiomegaly. Moderate-sized hiatal hernia. No confluent opacities, effusions or edema. No acute bony abnormality. IMPRESSION: Cardiomegaly.  Moderate-sized hiatal hernia.  No active disease. Electronically Signed   By: Rolm Baptise M.D.   On: 05/22/2019 23:37   Dg Abd Portable 1v  Result Date: 05/27/2019 CLINICAL DATA:  Abdominal discomfort, constipation. EXAM: PORTABLE ABDOMEN - 1 VIEW COMPARISON:  None. FINDINGS: Mild dilated air-filled stomach is noted. No definite colonic or small bowel dilatation is noted. Moderate amount of stool is noted. No  abnormal calcifications are noted. IMPRESSION: Mildly dilated air-filled stomach is noted. No definite evidence of large or small bowel dilatation. Moderate stool burden. Electronically Signed   By: Marijo Conception M.D.   On: 05/27/2019 11:11   Dg Addison Bailey G Tube Plc W/fl W/rad  Result Date: 06/04/2019 INDICATION: Malnutition.  Unable to swallow. EXAM: ATTEMPTED FEEDING TUBE PLACEMENT UNDER FLUOROSCOPY FLUOROSCOPY TIME:  1 minutes. 10 seconds. COMPLICATIONS: None immediate PROCEDURE: The Cortrak feeding tube was lubricated with viscous lidocaine. Initial attempt at placement was made to the right nostril however the feeding tube would not pass. Subsequent attempts were made to the left nostril. The feeding tube was successfully passed through the nasopharynx, however on repeated attempts at placement the tube repeatedly went into the trachea rather than the esophagus. Attempts were made both within and without a guidewire, but were limited as the patient was resistant and could not cooperate by swallowing. After multiple attempts and patient refusing additional attempts, the procedure was terminated. There was no evidence of immediate complication. IMPRESSION: Unsuccessful attempted Cortrak feeding tube placement under fluoroscopy, as discussed above. Percutaneous gastrostomy tube placement may need to be considered. Electronically Signed   By: Earle Gell M.D.   On: 06/04/2019 10:53   Mr Abdomen Mrcp Moise Boring Contast  Result Date: 05/24/2019 CLINICAL DATA:  Pt unable to follow breathing instructions, best possible images. Cholelithiasis Pancreatitis, acute, first episode, etiol unknown, 2-3 days out 59ml gadavist Cholelithiasis Pancreatitis, acute, first episode, etiol unknown, 2-3 days out EXAM: MRI ABDOMEN WITHOUT AND WITH CONTRAST (INCLUDING MRCP) TECHNIQUE: Multiplanar multisequence MR imaging of the abdomen was performed both before and after the administration of intravenous contrast. Heavily T2-weighted images  of the biliary and pancreatic ducts were obtained, and three-dimensional MRCP images were rendered by post processing. CONTRAST:  9 mL Gadavist COMPARISON:  CT abdomen 05/23/2019 FINDINGS: Lower chest:  Small bilateral pleural effusions. Hepatobiliary: No intrahepatic or extrahepatic biliary duct dilatation. No filling defect within the common bile duct. Common bile duct is upper limits of normal in diameter at 6 mm. Small amount of layering sludge or gallstones within the gallbladder present. No pericholecystic fluid or gallbladder distension. Pancreas: Mild edema within the pancreas. The pancreatic contours are well maintained. There is peripancreatic fluid and fluid extending along the pericolic gutters increased from comparison CT. No organized fluid collections Postcontrast imaging demonstrates fairly uniform enhancement of the pancreatic parenchyma without evidence of necrosis. There is no pancreatic ductal dilatation. No vascular complication associated pancreatitis. Spleen: Normal spleen. Adrenals/urinary tract: Adrenal glands and kidneys are normal. Stomach/Bowel: Moderate  hiatal hernia. Increased fluid from the pancreatitis extends into the hernia sac. Vascular/Lymphatic: Abdominal aortic normal caliber. No retroperitoneal periportal lymphadenopathy. Musculoskeletal: No aggressive osseous lesion IMPRESSION: 1. Acute pancreatitis with increase in peripancreatic fluid extending along the pericolic gutters. No organized collections. No evidence of pancreatic necrosis. 2. No etiology of pancreatitis. Several gallstones within the gallbladder but no cholelithiasis. No biliary obstruction. Imaging findings are not typical of autoimmune pancreatitis. 3. Bilateral pleural effusions, basilar atelectasis and moderate hiatal hernia. Electronically Signed   By: Suzy Bouchard M.D.   On: 05/24/2019 07:46   Vas [place] Lower Extremity Venous (dvt)  Result Date: 06/01/2019  Lower Venous Study Indications: Swelling, and  SOB.  Limitations: Body habitus and patient required to sit in upright position secondary to shortness of breath and hypoxia. Comparison Study: No prior study. Performing Technologist: Maudry Mayhew MHA, RDMS, RVT, RDCS  Examination Guidelines: A complete evaluation includes B-mode imaging, spectral Doppler, color Doppler, and power Doppler as needed of all accessible portions of each vessel. Bilateral testing is considered an integral part of a complete examination. Limited examinations for reoccurring indications may be performed as noted.  +---------+---------------+---------+-----------+----------+------------------+ RIGHT    CompressibilityPhasicitySpontaneityPropertiesSummary            +---------+---------------+---------+-----------+----------+------------------+ CFV                                                   Not visualized     +---------+---------------+---------+-----------+----------+------------------+ SFJ                                                   Not visualized     +---------+---------------+---------+-----------+----------+------------------+ FV Prox                          Yes                  patent by color                                                          Doppler            +---------+---------------+---------+-----------+----------+------------------+ FV Mid                           Yes                  patent by color                                                          Doppler            +---------+---------------+---------+-----------+----------+------------------+ FV Distal                        Yes  patent by color                                                          Doppler            +---------+---------------+---------+-----------+----------+------------------+ PFV                                                   Not visualized      +---------+---------------+---------+-----------+----------+------------------+ POP      Full           Yes      Yes                                     +---------+---------------+---------+-----------+----------+------------------+ PTV      Full                    Yes                                     +---------+---------------+---------+-----------+----------+------------------+ PERO     Full                    Yes                                     +---------+---------------+---------+-----------+----------+------------------+ Unable to perform all compression maneuvers secondary to patient position.  +---------+---------------+---------+-----------+----------+------------------+ LEFT     CompressibilityPhasicitySpontaneityPropertiesSummary            +---------+---------------+---------+-----------+----------+------------------+ CFV                                                   Not visualized     +---------+---------------+---------+-----------+----------+------------------+ SFJ                                                   Not visualized     +---------+---------------+---------+-----------+----------+------------------+ FV Prox                          Yes                  patent by color                                                          Doppler            +---------+---------------+---------+-----------+----------+------------------+ FV Mid  Yes                  patent by color                                                          Doppler            +---------+---------------+---------+-----------+----------+------------------+ FV Distal                        Yes                  patent by color                                                          Doppler            +---------+---------------+---------+-----------+----------+------------------+ PFV                                                    Not visualized     +---------+---------------+---------+-----------+----------+------------------+ POP      Full           Yes      Yes                                     +---------+---------------+---------+-----------+----------+------------------+ PTV      Full                    Yes                                     +---------+---------------+---------+-----------+----------+------------------+ PERO     Full                    Yes                                     +---------+---------------+---------+-----------+----------+------------------+ Unable to perform all compression maneuvers secondary to patient position.    Summary: Right: There is no evidence of deep vein thrombosis in the lower extremity. However, portions of this examination were limited- see technologist comments above. No cystic structure found in the popliteal fossa. Left: There is no evidence of deep vein thrombosis in the lower extremity. However, portions of this examination were limited- see technologist comments above. No cystic structure found in the popliteal fossa.  *See table(s) above for measurements and observations. Electronically signed by Curt Jews MD on 06/01/2019 at 4:57:22 PM.    Final    Vas [place] Upper Extremity Venous Duplex  Result Date: 06/08/2019 UPPER VENOUS STUDY  Indications: Edema Comparison Study: no prior Performing Technologist: June Leap RDMS, RVT  Examination Guidelines: A complete evaluation includes B-mode imaging, spectral Doppler, color Doppler, and power Doppler  as needed of all accessible portions of each vessel. Bilateral testing is considered an integral part of a complete examination. Limited examinations for reoccurring indications may be performed as noted.  Right Findings: +----------+------------+---------+-----------+----------+-------+ RIGHT     CompressiblePhasicitySpontaneousPropertiesSummary  +----------+------------+---------+-----------+----------+-------+ Subclavian               Yes       Yes                      +----------+------------+---------+-----------+----------+-------+  Left Findings: +----------+------------+---------+-----------+----------+--------------+ LEFT      CompressiblePhasicitySpontaneousProperties   Summary     +----------+------------+---------+-----------+----------+--------------+ IJV                                                 Not visualized +----------+------------+---------+-----------+----------+--------------+ Subclavian    Full       Yes       Yes                             +----------+------------+---------+-----------+----------+--------------+ Axillary      Full       Yes       Yes                             +----------+------------+---------+-----------+----------+--------------+ Brachial      None       No        No               PICC LOCATION  +----------+------------+---------+-----------+----------+--------------+ Radial        Full                                                 +----------+------------+---------+-----------+----------+--------------+ Ulnar         Full                                                 +----------+------------+---------+-----------+----------+--------------+ Cephalic      None                                                 +----------+------------+---------+-----------+----------+--------------+ Basilic                                             Not visualized +----------+------------+---------+-----------+----------+--------------+  Summary:  Right: No evidence of thrombosis in the subclavian.  Left: Findings consistent with acute deep vein thrombosis involving the left brachial veins. Findings consistent with acute superficial vein thrombosis involving the left cephalic vein.  *See table(s) above for measurements and observations.  Diagnosing  physician: Curt Jews MD Electronically signed by Curt Jews MD on 06/08/2019 at 5:10:03 PM.    Final    US Abdomen Limited Ruq  Result Date: 05/23/2019 CLINICAL DATA:  78 year old female  with right upper quadrant abdominal pain. EXAM: ULTRASOUND ABDOMEN LIMITED RIGHT UPPER QUADRANT COMPARISON:  CT of the abdomen pelvis dated 05/23/2019 FINDINGS: Evaluation is limited due to patient's body habitus. Gallbladder: The gallbladder is distended. There is sludge and stone within the gallbladder. There is no gallbladder wall thickening or pericholecystic fluid. Negative sonographic Murphy's sign. Common bile duct: Diameter: 9 mm Liver: The liver is unremarkable as visualized. Portal vein is patent on color Doppler imaging with normal direction of blood flow towards the liver. IMPRESSION: Cholelithiasis without sonographic evidence of acute cholecystitis. Electronically Signed   By: Anner Crete M.D.   On: 05/23/2019 02:04   Ir Thoracentesis Asp Pleural Space W/img Guide  Result Date: 05/31/2019 INDICATION: Patient with history of acute on chronic diastolic HF, acute hypoxic respiratory distress, and bilateral pleural effusions (right side drained 05/30/2019 yielding 1.2 L). Request is made for diagnostic and therapeutic left thoracentesis. EXAM: ULTRASOUND GUIDED DIAGNOSTIC AND THERAPEUTIC LEFT THORACENTESIS MEDICATIONS: 15 mL 1% lidocaine COMPLICATIONS: None immediate. PROCEDURE: An ultrasound guided thoracentesis was thoroughly discussed with the patient and questions answered. The benefits, risks, alternatives and complications were also discussed. The patient understands and wishes to proceed with the procedure. Written consent was obtained. Ultrasound was performed to localize and mark an adequate pocket of fluid in the left chest. The area was then prepped and draped in the normal sterile fashion. 1% Lidocaine was used for local anesthesia. Under ultrasound guidance a 6 Fr Safe-T-Centesis catheter was  introduced. Thoracentesis was performed. The catheter was removed and a dressing applied. FINDINGS: A total of approximately 650 mL of hazy gold fluid was removed. Samples were sent to the laboratory as requested by the clinical team. IMPRESSION: Successful ultrasound guided left thoracentesis yielding 650 mL of pleural fluid. Read by: Earley Abide, PA-C Electronically Signed   By: Jerilynn Mages.  Shick M.D.   On: 05/31/2019 14:27   Ir Thoracentesis Asp Pleural Space W/img Guide  Result Date: 05/30/2019 INDICATION: Patient with history of acute on chronic diastolic HF, acute hypoxic respiratory distress, and bilateral pleural effusions (R>L). Request is made for diagnostic and therapeutic right thoracentesis. EXAM: ULTRASOUND GUIDED DIAGNOSTIC AND THERAPEUTIC RIGHT THORACENTESIS MEDICATIONS: 15 mL 1% lidocaine COMPLICATIONS: None immediate. PROCEDURE: An ultrasound guided thoracentesis was thoroughly discussed with the patient and questions answered. The benefits, risks, alternatives and complications were also discussed. The patient understands and wishes to proceed with the procedure. Written consent was obtained. Ultrasound was performed to localize and mark an adequate pocket of fluid in the right chest. The area was then prepped and draped in the normal sterile fashion. 1% Lidocaine was used for local anesthesia. Under ultrasound guidance a 6 Fr Safe-T-Centesis catheter was introduced. Thoracentesis was performed. The catheter was removed and a dressing applied. FINDINGS: A total of approximately 1.2 L of hazy gold fluid was removed. Samples were sent to the laboratory as requested by the clinical team. IMPRESSION: Successful ultrasound guided right thoracentesis yielding 1.2 L of pleural fluid. Read by: Earley Abide, PA-C Electronically Signed   By: Jerilynn Mages.  Shick M.D.   On: 05/30/2019 09:56   US Thoracentesis Asp Pleural Space W/img Guide  Result Date: 06/05/2019 INDICATION: Patient with history of CHF, bilateral  pleural effusions; request made for diagnostic and therapeutic left thoracentesis. EXAM: ULTRASOUND GUIDED DIAGNOSTIC AND THERAPEUTIC LEFT THORACENTESIS MEDICATIONS: None COMPLICATIONS: None immediate. PROCEDURE: An ultrasound guided thoracentesis was thoroughly discussed with the patient's daughter (patient with history of dementia) and questions answered. The benefits, risks, alternatives and complications were also  discussed. The patient's daughter understands and wishes to proceed with the procedure. Written consent was obtained. Ultrasound was performed to localize and mark an adequate pocket of fluid in the left chest. The area was then prepped and draped in the normal sterile fashion. 1% Lidocaine was used for local anesthesia. Under ultrasound guidance a 6 Fr Safe-T-Centesis catheter was introduced. Thoracentesis was performed. The catheter was removed and a dressing applied. FINDINGS: A total of approximately 320 cc of yellow fluid was removed. Samples were sent to the laboratory as requested by the clinical team. The left pleural effusion was small by today's ultrasound. IMPRESSION: Successful ultrasound guided diagnostic and therapeutic left thoracentesis yielding 320 cc of pleural fluid. Read by: Rowe Robert, PA-C Electronically Signed   By: Jerilynn Mages.  Shick M.D.   On: 06/05/2019 12:50    Subjective: No new complaints.   Discharge Exam: Vitals:   06/13/19 0928 06/13/19 1037  BP: (!) 110/53 (!) 113/47  Pulse:  (!) 49  Resp:  17  Temp:    SpO2:  100%   Vitals:   06/13/19 0733 06/13/19 0832 06/13/19 0928 06/13/19 1037  BP: 108/60  (!) 110/53 (!) 113/47  Pulse: (!) 108 (!) 116  (!) 49  Resp: 18 (!) 22  17  Temp: 97.7 F (36.5 C)     TempSrc: Oral     SpO2: 100% 100%  100%  Weight:      Height:        General: Pt is alert, awake, not in acute distress Cardiovascular: RRR, S1/S2 +, Respiratory: diminished at bases.  Abdominal: Soft, NT, ND, bowel sounds +     The results of  significant diagnostics from this hospitalization (including imaging, microbiology, ancillary and laboratory) are listed below for reference.     Microbiology: Recent Results (from the past 240 hour(s))  SARS Coronavirus 2     Status: None   Collection Time: 06/05/19  9:05 AM  Result Value Ref Range Status   SARS Coronavirus 2 NOT DETECTED NOT DETECTED Final    Comment: (NOTE) SARS-CoV-2 target nucleic acids are NOT DETECTED. The SARS-CoV-2 RNA is generally detectable in upper and lower respiratory specimens during the acute phase of infection.  Negative  results do not preclude SARS-CoV-2 infection, do not rule out co-infections with other pathogens, and should not be used as the sole basis for treatment or other patient management decisions.  Negative results must be combined with clinical observations, patient history, and epidemiological information. The expected result is Not Detected. Fact Sheet for Patients: http://www.biofiredefense.com/wp-content/uploads/2020/03/BIOFIRE-COVID -19-patients.pdf Fact Sheet for Healthcare Providers: http://www.biofiredefense.com/wp-content/uploads/2020/03/BIOFIRE-COVID -19-hcp.pdf This test is not yet approved or cleared by the [place] and  has been authorized for detection and/or diagnosis of SARS-CoV-2 by FDA under an Emergency Use Authorization (EUA).  This EUA will remain in effec t (meaning this test can be used) for the duration of  the COVID-19 declaration under Section 564(b)(1) of the Act, 21 U.S.C. section 360bbb-3(b)(1), unless the authorization is terminated or revoked sooner. Performed at [facility], [place] [address], [facility], [place] [address]   Gram stain     Status: None   Collection Time: 06/05/19 12:49 PM   Specimen: Pleural, Left; Pleural Fluid  Result Value Ref Range Status   Specimen Description PLEURAL LEFT  Final   Special Requests NONE  Final   Gram Stain   Final    FEW WBC PRESENT, PREDOMINANTLY  MONONUCLEAR NO ORGANISMS SEEN Performed at [facility], [address] [place], [place] [address]    Report Status 06/06/2019 FINAL  Final  Fungus Culture With Stain     Status: None (Preliminary result)   Collection Time: 06/05/19 12:49 PM   Specimen: Pleural, Left; Pleural Fluid  Result Value Ref Range Status   Fungus Stain Final report  Final    Comment: (NOTE) Performed At: [facility] [place], [place] [id number] Rush Farmer MD [id number]    Fungus (Mycology) Culture PENDING  Incomplete   Fungal Source PLEURAL  Final    Comment: LEFT Performed at [facility], [place] [address], [place], [place] [address]   Fungus Culture Result     Status: None   Collection Time: 06/05/19 12:49 PM  Result Value Ref Range Status   Result 1 Comment  Final    Comment: (NOTE) KOH/Calcofluor preparation:  no fungus observed. Performed At: [facility] [place], [place] [id number] Rush Farmer MD [id number]      Labs: BNP (last 3 results) No results for input(s): BNP in the last 8760 hours. Basic Metabolic Panel: Recent Labs  Lab 06/06/19 2337 06/07/19 1101 06/08/19 0457 06/09/19 0009 06/10/19 1035  NA 151* 151* 147* 137  --   K 3.7 3.9 4.1 3.0*  --   CL 120* 121* 116* 103  --   CO2 22 24 22 23   --   GLUCOSE 260* 150* 149* 120*  --   BUN 55* 52* 54* 22  --   CREATININE 1.47* 1.24* 1.49* 0.81  --   CALCIUM 8.4* 8.3* 8.5* 7.8*  --   MG 2.5* 2.6* 2.6* 1.9 2.3  PHOS  --   --   --  1.3*  --    Liver Function Tests: Recent Labs  Lab 06/09/19 0009  AST 23  ALT 19  ALKPHOS 46  BILITOT 0.6  PROT 4.8*  ALBUMIN 2.5*   No results for input(s): LIPASE, AMYLASE in the last 168 hours. No results for input(s): AMMONIA in the last 168 hours. CBC: Recent Labs  Lab 06/06/19 2337 06/07/19 1101 06/08/19 1642 06/09/19 1055  WBC 12.4* 17.4*  --  26.6*  NEUTROABS  --   --   --  25.5*  HGB 6.7* 9.7* 9.9* 12.2  HCT  21.5* 31.1* 32.1* 39.4  MCV 96.8 97.5  --  96.6  PLT 175 199  --  198   Cardiac Enzymes: No results for input(s): CKTOTAL, CKMB, CKMBINDEX, TROPONINI in the last 168 hours. BNP: Invalid input(s): POCBNP CBG: Recent Labs  Lab 06/11/19 1950 06/12/19 0001 06/12/19 0419 06/12/19 0732 06/12/19 1116  GLUCAP 161* 150* 148* 134* 271*   D-Dimer No results for input(s): DDIMER in the last 72 hours. Hgb A1c No results for input(s): HGBA1C in the last 72 hours. Lipid Profile No results for input(s): CHOL, HDL, LDLCALC, TRIG, CHOLHDL, LDLDIRECT in the last 72 hours. Thyroid function studies No results for input(s): TSH, T4TOTAL, T3FREE, THYROIDAB in the last 72 hours.  Invalid input(s): FREET3 Anemia work up No results for input(s): VITAMINB12, FOLATE, FERRITIN, TIBC, IRON, RETICCTPCT in the last 72 hours. Urinalysis    Component Value Date/Time   COLORURINE YELLOW 05/29/2019 1115   APPEARANCEUR CLOUDY (A) 05/29/2019 1115   LABSPEC 1.023 05/29/2019 1115   PHURINE 5.0 05/29/2019 1115   GLUCOSEU 150 (A) 05/29/2019 1115   HGBUR SMALL (A) 05/29/2019 1115   BILIRUBINUR NEGATIVE 05/29/2019 1115   BILIRUBINUR negative 11/20/2017 [place] 05/29/2019 1115  PROTEINUR NEGATIVE 05/29/2019 1115   UROBILINOGEN 0.2 11/20/2017 1447   UROBILINOGEN 1.0 11/30/2016 1856   NITRITE NEGATIVE 05/29/2019 1115   LEUKOCYTESUR NEGATIVE 05/29/2019 1115   Sepsis Labs Invalid input(s): PROCALCITONIN,  WBC,  LACTICIDVEN Microbiology Recent Results (from the past 240 hour(s))  SARS Coronavirus 2     Status: None   Collection Time: 06/05/19  9:05 AM  Result Value Ref Range Status   SARS Coronavirus 2 NOT DETECTED NOT DETECTED Final    Comment: (NOTE) SARS-CoV-2 target nucleic acids are NOT DETECTED. The SARS-CoV-2 RNA is generally detectable in upper and lower respiratory specimens during the acute phase of infection.  Negative  results do not preclude SARS-CoV-2 infection, do not rule  out co-infections with other pathogens, and should not be used as the sole basis for treatment or other patient management decisions.  Negative results must be combined with clinical observations, patient history, and epidemiological information. The expected result is Not Detected. Fact Sheet for Patients: http://www.biofiredefense.com/wp-content/uploads/2020/03/BIOFIRE-COVID -19-patients.pdf Fact Sheet for Healthcare Providers: http://www.biofiredefense.com/wp-content/uploads/2020/03/BIOFIRE-COVID -19-hcp.pdf This test is not yet approved or cleared by the [place] and  has been authorized for detection and/or diagnosis of SARS-CoV-2 by FDA under an Emergency Use Authorization (EUA).  This EUA will remain in effec t (meaning this test can be used) for the duration of  the COVID-19 declaration under Section 564(b)(1) of the Act, 21 U.S.C. section 360bbb-3(b)(1), unless the authorization is terminated or revoked sooner. Performed at [facility], [place] [address], [place], [place] [address]   Gram stain     Status: None   Collection Time: 06/05/19 12:49 PM   Specimen: Pleural, Left; Pleural Fluid  Result Value Ref Range Status   Specimen Description PLEURAL LEFT  Final   Special Requests NONE  Final   Gram Stain   Final    FEW WBC PRESENT, PREDOMINANTLY MONONUCLEAR NO ORGANISMS SEEN Performed at [facility], [place] [address] [place], [place] [address]    Report Status 06/06/2019 FINAL  Final  Fungus Culture With Stain     Status: None (Preliminary result)   Collection Time: 06/05/19 12:49 PM   Specimen: Pleural, Left; Pleural Fluid  Result Value Ref Range Status   Fungus Stain Final report  Final    Comment: (NOTE) Performed At: [facility] [place], [place] [id number] Rush Farmer MD [id number]    Fungus (Mycology) Culture PENDING  Incomplete   Fungal Source PLEURAL  Final    Comment: LEFT Performed at [facility], [place] [address] [place], [place] [address]   Fungus Culture Result     Status: None   Collection Time: 06/05/19 12:49 PM  Result Value Ref Range Status   Result 1 Comment  Final    Comment: (NOTE) KOH/Calcofluor preparation:  no fungus observed. Performed At: [facility] [place], [place] [id number] Rush Farmer MD [id number]      Time coordinating discharge: 32 minutes  SIGNED:   Hosie Poisson, MD  [facility] 06/13/2019, 11:33 AM Pager   If 7PM-7AM, please contact night-coverage [email or web address] Password [email or web address]

## 2019-06-13 NOTE — Progress Notes (Signed)
CPT held at this time due to pain and nausea.

## 2019-06-13 NOTE — Care Management Important Message (Signed)
Important Message  Patient Details  Name: SOLENNE MANWARREN MRN: [id number] Date of Birth: 01/22/1941   Medicare Important Message Given:  Yes     [name] 06/13/2019, 12:52 PM

## 2019-06-16 LAB — PH, BODY FLUID: pH, Body Fluid: 7.7

## 2019-06-21 ENCOUNTER — Other Ambulatory Visit: Payer: Self-pay | Admitting: Cardiovascular Disease

## 2019-06-21 NOTE — Telephone Encounter (Signed)
Message routed to PCP to inform.   Copied from [place] [phone]. Topic: General - Deceased Patient >> 07-01-2019  2:21 PM Alanda Slim E wrote: Reason for CRM: [facility] (Mr. Elenore Rota and Mrs. Eugenie Filler called to tell Dr. Birdie Riddle that Ms. Killman has passed away   Route to department's PEC Pool.

## 2019-06-22 DEATH — deceased

## 2019-06-23 ENCOUNTER — Other Ambulatory Visit: Payer: Self-pay | Admitting: Family Medicine

## 2019-07-07 LAB — FUNGUS CULTURE WITH STAIN

## 2019-07-07 LAB — FUNGUS CULTURE RESULT

## 2019-07-07 LAB — FUNGAL ORGANISM REFLEX

## 2019-07-09 ENCOUNTER — Other Ambulatory Visit: Payer: Self-pay | Admitting: Family Medicine

## 2019-07-15 IMAGING — US IR THORACENTESIS ASP PLEURAL SPACE W/IMG GUIDE
1 series · 2 of 2 positions shown · non-contrast
Comparison: none

INDICATION: Patient with history of acute on chronic diastolic HF, acute hypoxic
respiratory distress, and bilateral pleural effusions (R>L). Request
is made for diagnostic and therapeutic right thoracentesis.

[Series 1: ir thoracentesis asp pleural space w/img guide · 2 of 2 slices shown]
[im 1/2]
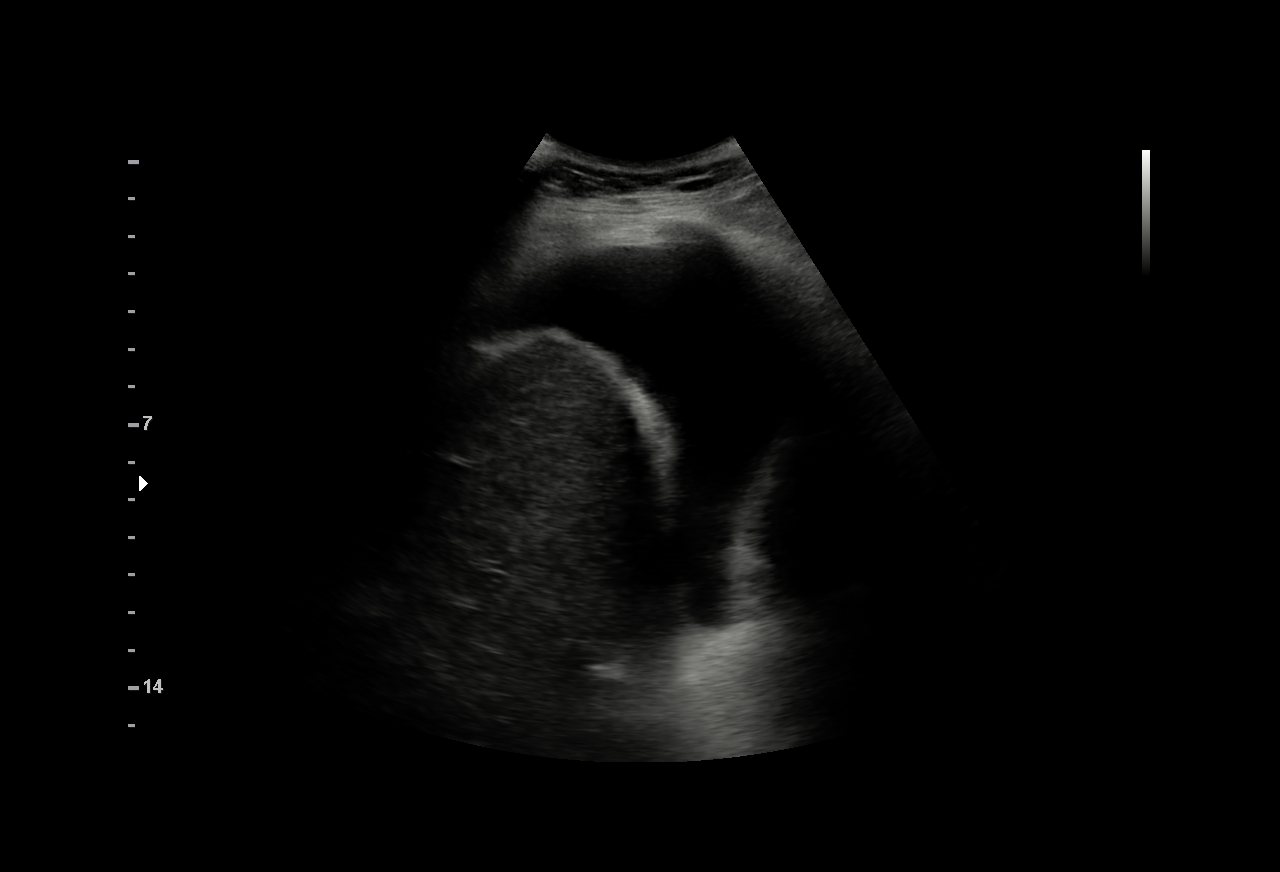
[im 2/2]
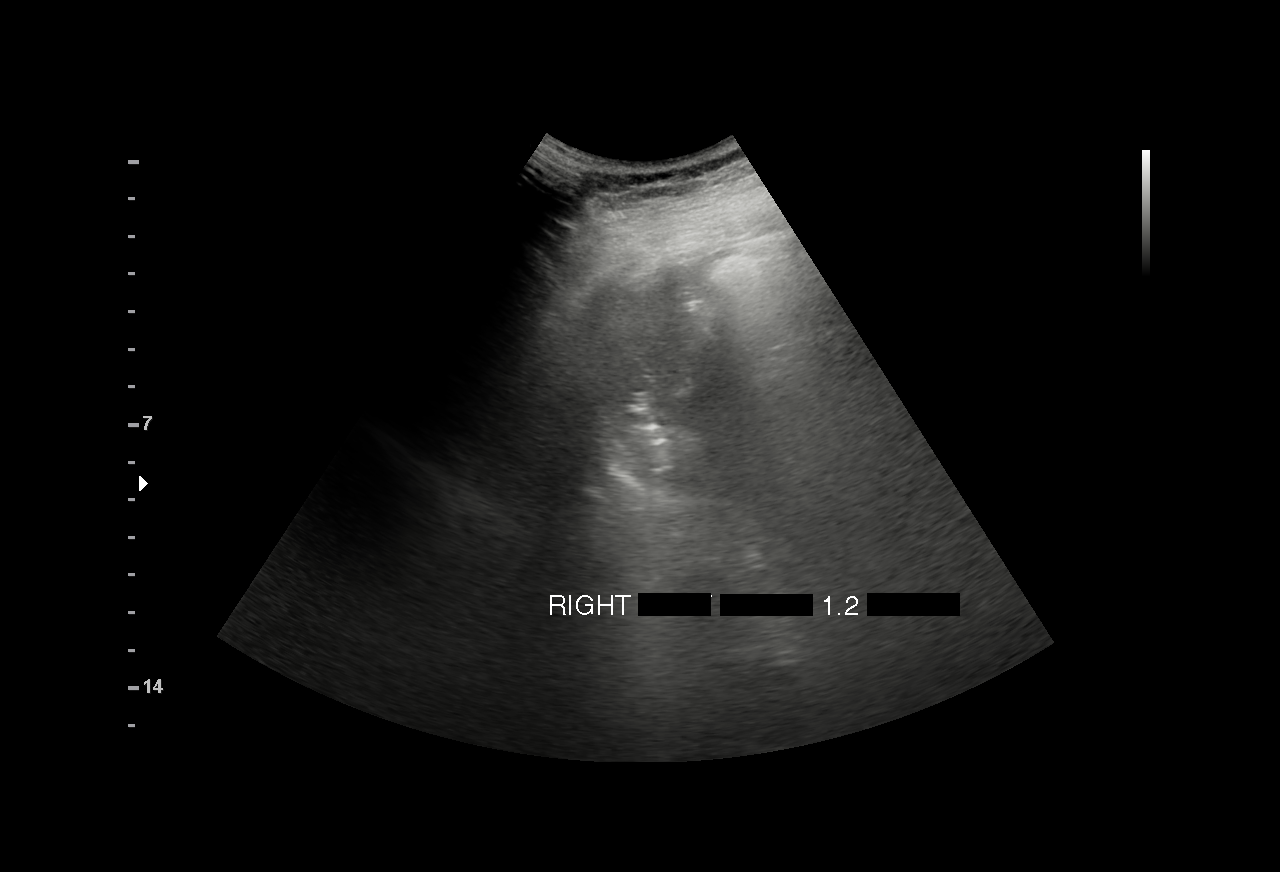

[2 of 2 positions shown; findings below may reference images not displayed]

EXAM:
ULTRASOUND GUIDED DIAGNOSTIC AND THERAPEUTIC RIGHT THORACENTESIS

MEDICATIONS:
15 mL 1% lidocaine

COMPLICATIONS:
None immediate.

PROCEDURE:
An ultrasound guided thoracentesis was thoroughly discussed with the
patient and questions answered. The benefits, risks, alternatives
and complications were also discussed. The patient understands and
wishes to proceed with the procedure. Written consent was obtained.

Ultrasound was performed to localize and mark an adequate pocket of
fluid in the right chest. The area was then prepped and draped in
the normal sterile fashion. 1% Lidocaine was used for local
anesthesia. Under ultrasound guidance a 6 Fr Safe-T-Centesis
catheter was introduced. Thoracentesis was performed. The catheter
was removed and a dressing applied.
FINDINGS: A total of approximately 1.2 L of hazy gold fluid was removed.
Samples were sent to the laboratory as requested by the clinical
team.
IMPRESSION: Successful ultrasound guided right thoracentesis yielding 1.2 L of
pleural fluid.

## 2019-07-16 IMAGING — DX PORTABLE CHEST - 1 VIEW
1 series · 1 of 1 positions shown · non-contrast
Comparison: 05/30/2019

CLINICAL DATA: Shortness of breath

EXAM:
PORTABLE CHEST 1 VIEW

[chest]
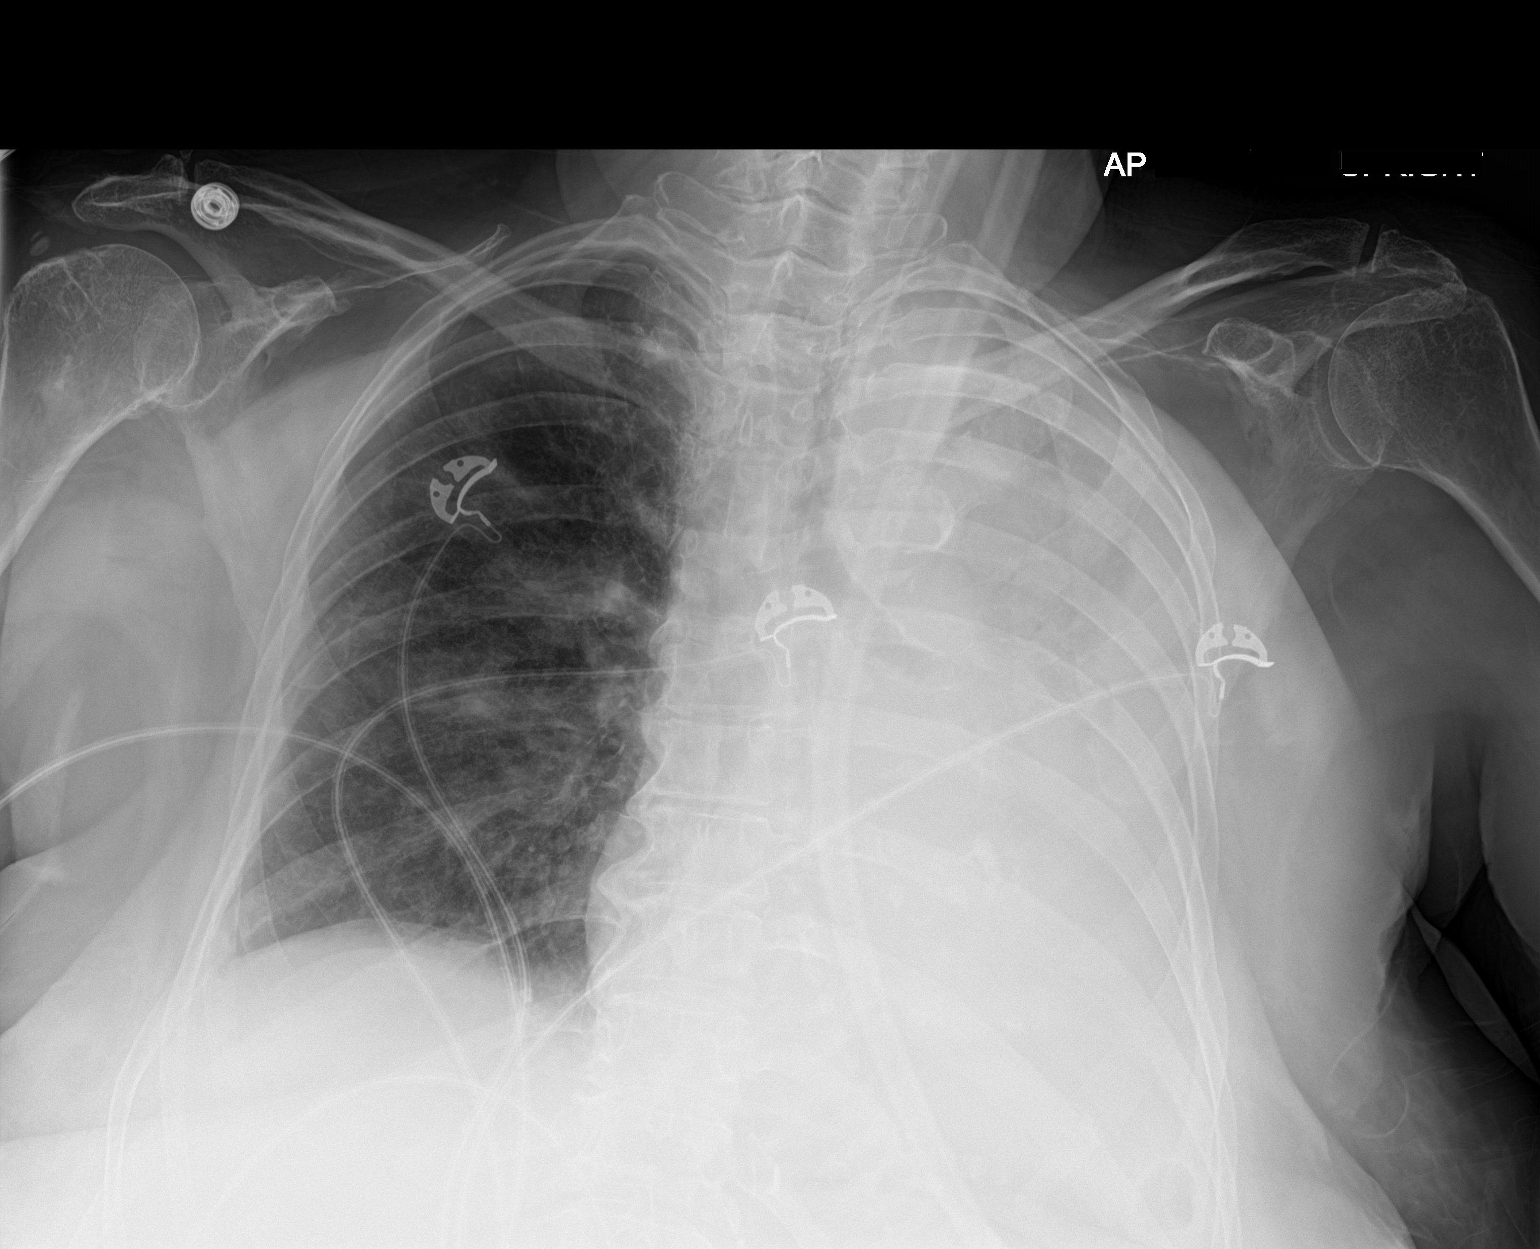

[1 of 1 positions shown; findings below may reference images not displayed]

FINDINGS: Worsening aeration on the left with near complete opacification of
the left hemithorax, likely a combination of effusion and
atelectasis. Patchy airspace disease noted in the right perihilar
region. No visible effusion on the right. No acute bony abnormality.
IMPRESSION: Worsening aeration on the left, likely a combination of effusion and
airspace disease/atelectasis with near complete opacification of the
left hemithorax.

Patchy right perihilar opacities.

## 2019-07-20 IMAGING — RF NASO G TUBE PLACEMENT WITH FL AND WITH RAD
1 series · 4 of 4 positions shown · non-contrast
Comparison: none

INDICATION: Malnutition.  Unable to swallow.

[Series 1: cp_standard · 0.34mm/px · 4 of 38 frames shown]
[frame 6/38]
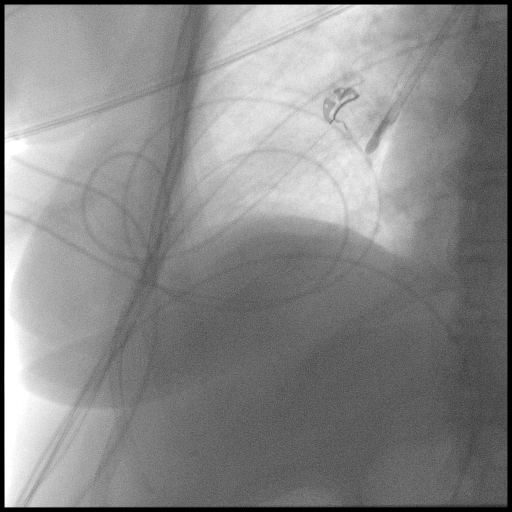
[frame 13/38]
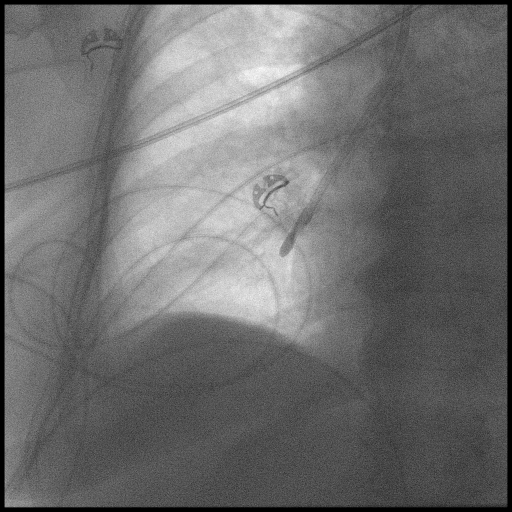
[frame 20/38]
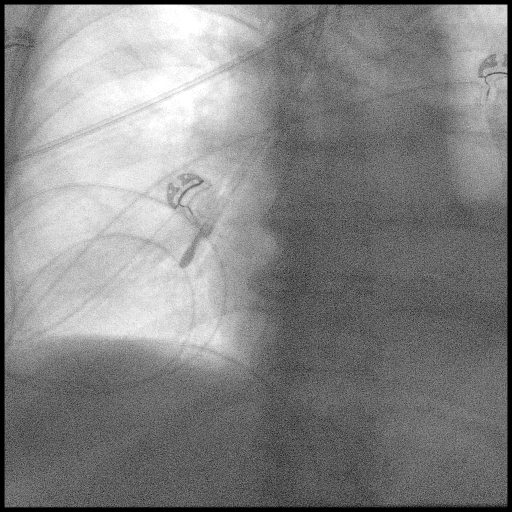
[frame 33/38]
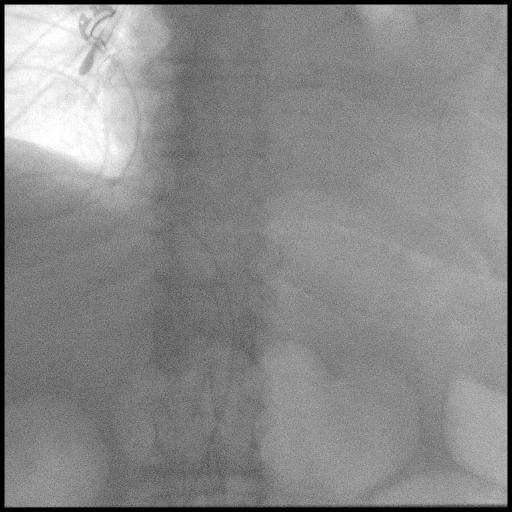

[4 of 4 positions shown; findings below may reference images not displayed]

EXAM:
ATTEMPTED FEEDING TUBE PLACEMENT UNDER FLUOROSCOPY

FLUOROSCOPY TIME:  1 minutes. 10 seconds.

COMPLICATIONS:
None immediate

PROCEDURE:
The Cortrak feeding tube was lubricated with viscous lidocaine.
Initial attempt at placement was made to the right nostril however
the feeding tube would not pass. Subsequent attempts were made to
the left nostril. The feeding tube was successfully passed through
the nasopharynx, however on repeated attempts at placement the tube
repeatedly went into the trachea rather than the esophagus. Attempts
were made both within and without a guidewire, but were limited as
the patient was resistant and could not cooperate by swallowing.
After multiple attempts and patient refusing additional attempts,
the procedure was terminated. There was no evidence of immediate
complication.
IMPRESSION: Unsuccessful attempted Cortrak feeding tube placement under
fluoroscopy, as discussed above. Percutaneous gastrostomy tube
placement may need to be considered.

## 2019-07-24 IMAGING — CR PORTABLE CHEST - 1 VIEW
1 series · 1 of 1 positions shown · non-contrast
Comparison: 06/05/2019, 06/04/2019, 06/02/2019

CLINICAL DATA: Sepsis

EXAM:
PORTABLE CHEST 1 VIEW

[AP]
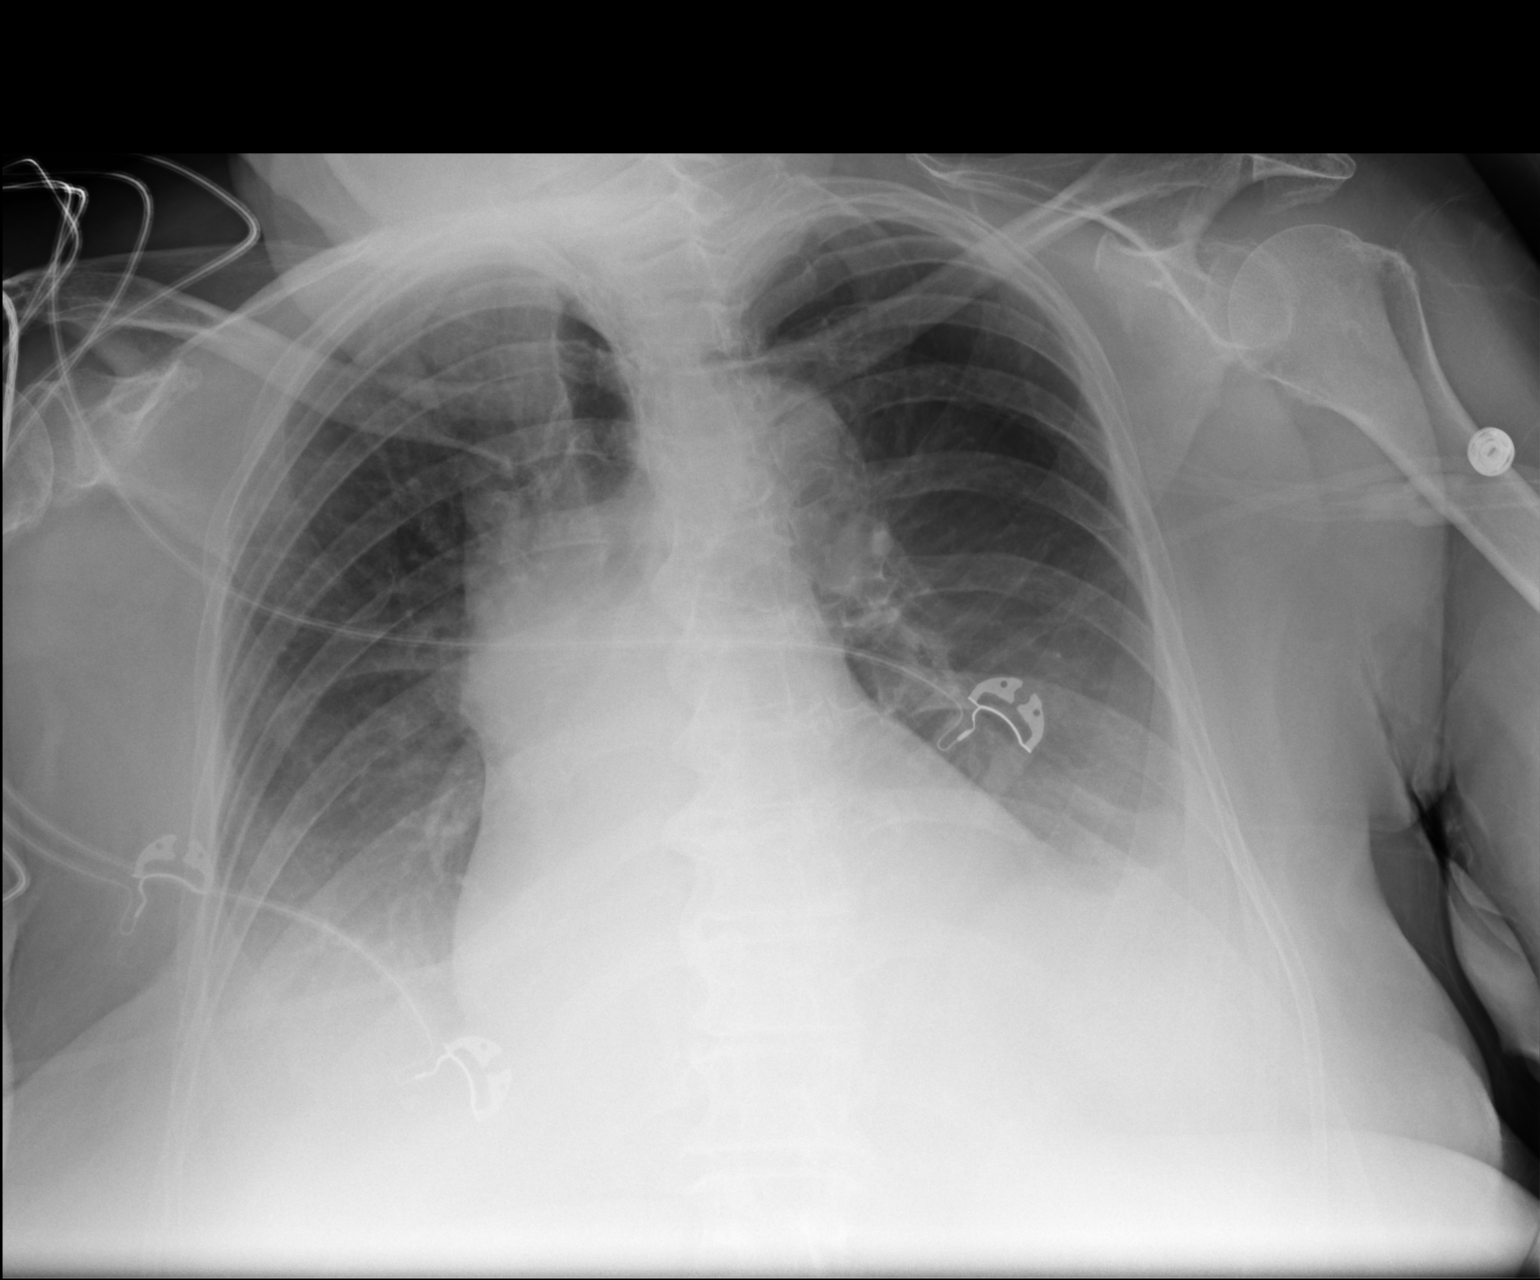

[1 of 1 positions shown; findings below may reference images not displayed]

FINDINGS: Small moderate left greater than right pleural effusions. Worsening
airspace disease at the right base. Persistent airspace disease at
the left lung base. Stable enlarged cardiomediastinal silhouette.
Asymmetric opacity in the right hilus. No pneumothorax.
IMPRESSION: 1. Small moderate bilateral left greater than right pleural
effusions, increased on the right side
2. Worsening airspace disease at the right lung base with continued
consolidation at the left base, atelectasis versus pneumonia
3. Cardiomegaly. Asymmetric right hilar opacity, could be due to
rotation and prominent pulmonary artery, however recommend dedicated
two-view chest when clinically feasible.

## 2021-06-19 ENCOUNTER — Encounter: Payer: Self-pay | Admitting: *Deleted

## 2022-09-09 ENCOUNTER — Encounter: Payer: Self-pay | Admitting: Gastroenterology
